# Patient Record
Sex: Female | Born: 1935 | Race: White | Hispanic: No | Marital: Married | Smoking: Never smoker
Health system: Southern US, Community
[De-identification: ages and names within clinical notes are randomized; demographics above are authoritative.]

## PROBLEM LIST (undated history)

## (undated) DIAGNOSIS — G43909 Migraine, unspecified, not intractable, without status migrainosus: Secondary | ICD-10-CM

## (undated) DIAGNOSIS — H3530 Unspecified macular degeneration: Secondary | ICD-10-CM

## (undated) DIAGNOSIS — M1990 Unspecified osteoarthritis, unspecified site: Secondary | ICD-10-CM

## (undated) DIAGNOSIS — E7800 Pure hypercholesterolemia, unspecified: Secondary | ICD-10-CM

## (undated) DIAGNOSIS — F0390 Unspecified dementia without behavioral disturbance: Secondary | ICD-10-CM

## (undated) DIAGNOSIS — I10 Essential (primary) hypertension: Secondary | ICD-10-CM

## (undated) DIAGNOSIS — R011 Cardiac murmur, unspecified: Secondary | ICD-10-CM

## (undated) DIAGNOSIS — K219 Gastro-esophageal reflux disease without esophagitis: Secondary | ICD-10-CM

## (undated) DIAGNOSIS — D649 Anemia, unspecified: Secondary | ICD-10-CM

## (undated) DIAGNOSIS — F419 Anxiety disorder, unspecified: Secondary | ICD-10-CM

## (undated) DIAGNOSIS — F32A Depression, unspecified: Secondary | ICD-10-CM

## (undated) DIAGNOSIS — E119 Type 2 diabetes mellitus without complications: Secondary | ICD-10-CM

## (undated) DIAGNOSIS — Z8781 Personal history of (healed) traumatic fracture: Secondary | ICD-10-CM

## (undated) DIAGNOSIS — M810 Age-related osteoporosis without current pathological fracture: Secondary | ICD-10-CM

## (undated) DIAGNOSIS — F329 Major depressive disorder, single episode, unspecified: Secondary | ICD-10-CM

## (undated) HISTORY — PX: JOINT REPLACEMENT: SHX530

## (undated) HISTORY — PX: ABDOMINAL HYSTERECTOMY: SHX81

## (undated) HISTORY — PX: KYPHOPLASTY: SHX5884

## (undated) HISTORY — PX: BREAST CYST EXCISION: SHX579

## (undated) HISTORY — PX: TONSILLECTOMY AND ADENOIDECTOMY: SUR1326

## (undated) HISTORY — DX: Depression, unspecified: F32.A

## (undated) HISTORY — PX: LAPAROSCOPIC CHOLECYSTECTOMY: SUR755

## (undated) HISTORY — DX: Anxiety disorder, unspecified: F41.9

## (undated) HISTORY — DX: Cardiac murmur, unspecified: R01.1

## (undated) HISTORY — DX: Anemia, unspecified: D64.9

## (undated) HISTORY — PX: APPENDECTOMY: SHX54

## (undated) HISTORY — PX: CATARACT EXTRACTION W/ INTRAOCULAR LENS  IMPLANT, BILATERAL: SHX1307

## (undated) HISTORY — PX: DILATION AND CURETTAGE OF UTERUS: SHX78

## (undated) HISTORY — DX: Age-related osteoporosis without current pathological fracture: M81.0

## (undated) HISTORY — PX: CARPAL TUNNEL RELEASE: SHX101

---
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
## 1998-10-12 ENCOUNTER — Ambulatory Visit (HOSPITAL_COMMUNITY): Admission: RE | Admit: 1998-10-12 | Discharge: 1998-10-12 | Payer: Self-pay | Admitting: Obstetrics & Gynecology

## 1999-09-29 ENCOUNTER — Encounter: Admission: RE | Admit: 1999-09-29 | Discharge: 1999-09-29 | Payer: Self-pay | Admitting: *Deleted

## 1999-09-29 ENCOUNTER — Encounter: Payer: Self-pay | Admitting: *Deleted

## 2000-10-03 ENCOUNTER — Encounter: Admission: RE | Admit: 2000-10-03 | Discharge: 2000-10-03 | Payer: Self-pay | Admitting: *Deleted

## 2000-10-03 ENCOUNTER — Encounter: Payer: Self-pay | Admitting: *Deleted

## 2000-11-08 ENCOUNTER — Encounter: Payer: Self-pay | Admitting: Obstetrics and Gynecology

## 2000-11-11 ENCOUNTER — Ambulatory Visit (HOSPITAL_COMMUNITY): Admission: RE | Admit: 2000-11-11 | Discharge: 2000-11-11 | Payer: Self-pay | Admitting: Obstetrics and Gynecology

## 2000-11-11 ENCOUNTER — Encounter (INDEPENDENT_AMBULATORY_CARE_PROVIDER_SITE_OTHER): Payer: Self-pay

## 2001-01-10 ENCOUNTER — Encounter (INDEPENDENT_AMBULATORY_CARE_PROVIDER_SITE_OTHER): Payer: Self-pay

## 2001-01-10 ENCOUNTER — Observation Stay (HOSPITAL_COMMUNITY): Admission: RE | Admit: 2001-01-10 | Discharge: 2001-01-11 | Payer: Self-pay | Admitting: Obstetrics and Gynecology

## 2001-10-06 ENCOUNTER — Encounter: Payer: Self-pay | Admitting: *Deleted

## 2001-10-06 ENCOUNTER — Encounter: Admission: RE | Admit: 2001-10-06 | Discharge: 2001-10-06 | Payer: Self-pay | Admitting: Family Medicine

## 2001-10-10 ENCOUNTER — Encounter: Admission: RE | Admit: 2001-10-10 | Discharge: 2001-10-10 | Payer: Self-pay | Admitting: Family Medicine

## 2001-10-10 ENCOUNTER — Encounter: Payer: Self-pay | Admitting: *Deleted

## 2002-05-29 ENCOUNTER — Ambulatory Visit (HOSPITAL_COMMUNITY): Admission: RE | Admit: 2002-05-29 | Discharge: 2002-05-29 | Payer: Self-pay | Admitting: Rheumatology

## 2002-05-29 ENCOUNTER — Encounter: Payer: Self-pay | Admitting: Rheumatology

## 2002-06-03 ENCOUNTER — Encounter: Payer: Self-pay | Admitting: Rheumatology

## 2002-06-03 ENCOUNTER — Ambulatory Visit (HOSPITAL_COMMUNITY): Admission: RE | Admit: 2002-06-03 | Discharge: 2002-06-03 | Payer: Self-pay | Admitting: Rheumatology

## 2002-10-14 ENCOUNTER — Encounter: Admission: RE | Admit: 2002-10-14 | Discharge: 2002-10-14 | Payer: Self-pay | Admitting: Family Medicine

## 2002-10-14 ENCOUNTER — Encounter: Payer: Self-pay | Admitting: *Deleted

## 2003-12-01 ENCOUNTER — Encounter: Admission: RE | Admit: 2003-12-01 | Discharge: 2003-12-01 | Payer: Self-pay | Admitting: Family Medicine

## 2005-01-18 ENCOUNTER — Encounter: Admission: RE | Admit: 2005-01-18 | Discharge: 2005-01-18 | Payer: Self-pay | Admitting: Family Medicine

## 2006-01-21 ENCOUNTER — Encounter: Admission: RE | Admit: 2006-01-21 | Discharge: 2006-01-21 | Payer: Self-pay | Admitting: Family Medicine

## 2007-02-19 ENCOUNTER — Encounter: Admission: RE | Admit: 2007-02-19 | Discharge: 2007-02-19 | Payer: Self-pay | Admitting: Family Medicine

## 2008-03-11 ENCOUNTER — Encounter: Admission: RE | Admit: 2008-03-11 | Discharge: 2008-03-11 | Payer: Self-pay | Admitting: Family Medicine

## 2011-02-23 NOTE — Op Note (Signed)
[facility]  Patient:    [name], [name]                       MRN: [id number] Proc. Date: 11/11/00 Adm. Date:  [id number] Attending:  Trevor Iha                           Operative Report  PREOPERATIVE DIAGNOSES:       1. Abnormal uterine bleeding.                               2. Endometrial mass on sonohistogram.  POSTOPERATIVE DIAGNOSES:      1. Abnormal uterine bleeding.                               2. Endometrial mass on sonohistogram.                               3. Endometrial polyp.  PROCEDURES:                   Hysteroscopy with D&C and polypectomy.  SURGEON:                      Trevor Iha, M.D.  ANESTHESIA:                   Monitored anesthetic care and paracervical                               block.  ESTIMATED BLOOD LOSS:         20 cc.  SORBITOL DEFICIT:             200 cc.  FINDINGS:                     Several small endometrial polyps in the posterior left fundus.  COMPLICATIONS:                Small left bundle uterine perforation, which appeared to be hemostatic by hysteroscope.  INDICATIONS:                  Ms. Dudenhoeffer is a 77 year old G2, P2 with abnormal uterine bleeding, postmenopausal bleeding with a normal endometrial biopsy two years ago.  Underwent a sonohistogram which showed a posterior endometrial 6 mm polypoid-appearing mass.  She presents today for hysteroscopy and seen for evaluation and treatment.  Informed consent was obtained.  DESCRIPTION OF PROCEDURE:     After adequate analgesia, the patient was placed in the dorsolithotomy position.  She was sterilely prepped and draped.  The bladder was sterilely drained.  Graves speculum was placed.  A paracervical block was placed with 1% Xylocaine and tenaculum was placed in the anterior lip of the cervix.  Uterus was sounded to 8 cm and easily dilated to a #25 dilator.  The hysteroscope was inserted, revealing the above findings.   Polyp forceps were used to grasp the endometrial polyps and retrieve them. Gentle sharp curettage was performed, retrieving several small fragments.  At this time hysteroscope was inserted; at this time unable to achieve adequate flow in the endometrial cavity to have good visualization; however,  we could tell the polyp appeared to be gone.  At this time, at the base of what the polyp was is a small 2-3 mm uterine perforation.  The hysteroscope was easily inserted into this and noted to be hemostatic.  However, it appeared to be Sorbitol flowing through the small perforation.  At this time the hysteroscope was removed.  The tenaculum was removed from the anterior lip of the cervix, and it was noted to be hemostatic.  The speculum was then removed.  The patient tolerated the procedure well and was stable in transfer to recovery.  ESTIMATED BLOOD LOSS:         (During procedure) 20 cc.  SORBITOL DEFICIT:             200 cc.  DISPOSITION:                  The patient will be discharged home with a routine instruction sheet for D&C.  Follow up in the office in two to three weeks.  We will watch her a little longer in the recovery room for any signs of intraabdominal bleeding or excess vaginal bleeding.  I will call in this evening to make sure she is still doing well. DD:  11/11/00 TD:  11/11/00 Job: [id number]

## 2011-02-23 NOTE — H&P (Signed)
[facility]  Patient:    [name], [name]                       MRN: [id number] Adm. Date:  [id number] Attending:  Trevor Iha                         History and Physical  HISTORY OF PRESENT ILLNESS:   Ms. Rochefort is a 77 year old, G2, P2, postmenopausal female, who had postmenopausal bleeding.  She underwent a hysteroscopy D&C for that, at which time showed complex hyperplasia without atypia.  The patient was offered hormonal management including Progesterone and re-biopsy in six months time versus an option of a hysterectomy.  The patient desires hysterectomy and presents today for laparoscopic-assisted vaginal hysterectomy and bilaterally salpingo-oophorectomy because she does understand that there is an increased risk of uterine cancer as quoted at approximately 3%.  PAST MEDICAL HISTORY:         1. Hypertension.                               2. Hypercholesterolemia.                               3. History of radiation therapy to the thyroid.  PAST SURGICAL HISTORY:        1. D&C.                               2. Vein surgery in the right leg.                               3. Several lumpectomies in the breast.  MEDICATIONS:                  1. Prempro 2.5 mg.                               2. Lipitor.  ALLERGIES:                    No known drug allergies.  PHYSICAL EXAMINATION:  VITAL SIGNS:                  Blood pressure 160/84.  HEART:                        Regular rate and rhythm.  LUNGS:                        Clear to auscultation bilaterally.  ABDOMEN:                      Nondistended, nontender.  PELVIC:                       Uterus is anteverted, mobile, nontender.  No adnexal masses are palpable.  IMPRESSION AND PLAN:          Abnormal uterine bleeding and postmenopausal bleeding.  Endometrial biopsy consistent with complex hyperplasia without atypia.  The patient desires hysterectomy and presents today  for laparoscopic-assisted vaginal hysterectomy with bilateral salpingo-oophorectomy.  The risks and benefits were discussed at length which include but are not limited to, risks of infection; bleeding; damage to bowel, bladder, ureters; possibility of cancer; and also the fact that we may not be able to eliminate ovarian cancer by removing the ovaries.  Also risk associated with blood transfusion including risks of hepatitis or AIDS and reaction to the blood transfusion.  She does give her informed consent. DD:  01/10/01 TD:  01/10/01 Job: [id number]

## 2011-02-23 NOTE — Discharge Summary (Signed)
[facility]  Patient:    KWANA, RINGEL                       MRN: [id number] Adm. Date:  [id number] Disc. Date: 01/11/01 Attending:  Trevor Iha                           Discharge Summary  HISTORY OF PRESENT ILLNESS:  Ms. Vanblarcom is a 77 year old G2, P2 with endometrial hyperplasia which is complex without atypia.  She presents for laparoscopic video assisted vaginal hysterectomy and bilateral salpingo-oophorectomy.  HOSPITAL COURSE:  The patient underwent a laparoscopic-assisted vaginal hysterectomy BSO with lysis of adhesions.  The surgery was uncomplicated.  ESTIMATED BLOOD LOSS:  100 cc.  FINDINGS AT TIME OF SURGERY:  Normal appearing uterus, tubes and ovaries but there were adhesions to the cecal appendix area which were sharply lysed during laparoscopy.  Her postoperative course is unremarkable and by postoperative day #1 she was ambulated without difficulty, tolerated a regular diet.  Her postoperative hemoglobin was 10.4 and patient desired discharge home.  The incisions were clean, dry and intact at time of discharge and she remained afebrile throughout her hospital course.  DISPOSITION:  Patient will be discharged home to follow up in the office in approximately 10 days.  She is sent home with a prescription for Darvocet 1-2 every 4 hours p.r.n. and told to return for increased fever, tenderness, bleeding or increased pain. DD:  01/11/01 TD:  01/11/01 Job: [id number]

## 2011-02-23 NOTE — H&P (Signed)
[facility]  Patient:    [name], [name]                       MRN: [id number] Adm. Date:  [id number] Attending:  Trevor Iha                         History and Physical  DATE OF BIRTH:  1923-01-08  HISTORY OF PRESENT ILLNESS:  The patient is a 77 year old gravida 2, para 2, who presents with abnormal uterine bleeding.  Last seen in my office 2-3 years ago.  She had some abnormal uterine bleeding, had an endometrial biopsy, and was benign.  She was continued on her PremPro.  She reported recently several bouts of bleeding, and then dark tissue, and on September 26, 2000, underwent a sonohistogram.  The sonohistogram showed a 6 mm density of the left endometrial wall which had calcifications to it.  Unable to determine whether this was a calcified polyp or endometrial fibroid.  She presents today for hysteroscopy and D&C for evaluation of abnormal bleeding and endometrial mass.  PAST MEDICAL HISTORY:  Significant for hypothyroidism, status post radioactive iodine.  PAST SURGICAL HISTORY:  Significant for benign lumpectomies in the breasts and also vein surgery.  MEDICATIONS:  Currently she is on Lipitor, PremPro, vitamin E, calcium, and vitamin D.  ALLERGIES:  No known drug allergies.  PHYSICAL EXAMINATION:  VITAL SIGNS:  Blood pressure is 174/90.  HEART:  Regular rate and rhythm.  LUNGS:  Clear to auscultation bilaterally.  ABDOMEN:  Nondistended and nontender.  PELVIC EXAMINATION:  Reveals an anteverted mobile nontender uterus.  The cervix is grossly within normal limits.  IMPRESSION AND PLAN:  Abnormal uterine bleeding and postmenopausal bleeding with endometrial mass.  Recommend hysteroscopy, D&C for evaluation and/or treatment of this.  Risks and benefits were discussed at length including, but not limited to risks of infection, bleeding, damage to uterus, tubes, ovaries, bowel, or bladder, or possibility of not relieving  pelvic bleeding.  Now, the patient does give her informed consent. DD:  11/11/00 TD:  11/11/00 Job: [id number]

## 2011-02-23 NOTE — Op Note (Signed)
[facility]  Patient:    [name], [name]                       MRN: [id number] Proc. Date: 01/10/01 Adm. Date:  [id number] Attending:  Trevor Iha                           Operative Report  PREOPERATIVE DIAGNOSES:       Postmenopausal bleeding and endometrial hyperplasia.  POSTOPERATIVE DIAGNOSES:      Postmenopausal bleeding and endometrial hyperplasia, plus adhesions.  PROCEDURE:                    Laparoscopically assisted vaginal hysterectomy with bilateral salpingo-oophorectomy with lysis of adhesions.  SURGEON:                      Trevor Iha, M.D.  ASSISTANT:                    Raynald Kemp, M.D.  ANESTHESIA:                   General endotracheal.  INDICATION:                   Ms. Knodel is a 77 year old G2, P2 with abnormal uterine bleeding.  Hysteroscopy/D&C revealed complex hyperplasia without atypia.  Patient was offered hormonal therapy with re-biopsy versus hysterectomy; patient desires hysterectomy and presents today for that, also removal of both tubes and ovaries.  Risks and benefits were discussed at length including, but not limited to, risks of infection, bleeding, damage to bowel, bladder or ureters, risk of cancer, also risks associated with blood transfusion including hepatitis or AIDS or reaction to blood transfusion. Patient does give her informed consent.  FINDINGS:                     Findings at the time of surgery included normal-appearing uterus, tubes and ovaries.  The cecum was adhered to the anterior abdominal wall, as was the omentum.  Normal-appearing appendix, otherwise.  The liver was slightly enlarged -- left lobe of the liver.  No obvious lesions of the liver were noted.  DESCRIPTION OF PROCEDURE:     After adequate anesthesia, the patient was placed in the dorsal lithotomy position.  She was sterilely prepped and draped.  The bladder was sterilely drained.  A ______  tenaculum was  placed on the cervix.  A 1-cm infraumbilical skin incision was made.  A Veress needle was inserted.  The abdomen was insufflated to dullness to percussion and two 5-mm trocars were placed to the left and right of the midline, two fingerbreadths above the pubic symphysis under direct visualization.  The above findings were noted.  At this time, lysis of adhesions was performed over the cecum, with care taken to avoid the bowel and also the omentum. Hemostasis was achieved with Bovie cautery.  After good hemostasis and the adhesions were removed, Ligasure instrument was placed across the right infundibulopelvic ligament with good cautery across the infundibulopelvic ligament.  It was then cut.  This was performed down across the round ligament with good hemostasis throughout the procedure.  The left infundibulopelvic ligament was identified in a similar fashion and the Ligasure instrument was placed across the IFP.  It was ligated and cut.  This was performed down the broad ligament, down across  the round ligament, with good hemostasis achieved at all levels.  At this time, the abdomen was desufflated.  The vaginal portion of the procedure was begun.  A weighted speculum was placed in the vagina.  A Jacobs tenaculum was placed on the posterior cervix.  A posterior colpotomy was performed.  The cervix was circumscribed with Bovie cautery. Ligasure instrument was placed across the uterosacral ligaments bilaterally, clamped and cut after good thermal burn.  This was also performed across the cardinal ligament and out the bladder pillars.  Bladder was dissected off the anterior surface of the cervix and anterior peritoneum was entered and replaced behind the bladder blade.  Ligasure instrument was placed across the uterine vasculature and on the lower portion of the broad ligaments bilaterally.  Good hemostasis was achieved with cautery and at this time, the fundus of the uterus was grasped with  a thyroid tenaculum.  Ligasure was placed across the remaining pedicles, clamped with good thermal burn with the Ligasure and then cut.  The uterus tubes and ovaries were removed intact. Good hemostasis was achieved.  Uterosacral ligaments were identified bilaterally.  They were suture-ligated with 0 Monocryl in a figure-of-eight fashion.  The remainder of the infundibulopelvic ligament was also ligated with 0 Monocryl in a figure-of-eight fashion.  Posterior peritoneum was closed in a pursestring fashion.  The vagina was then closed in a vertical fashion, plicating the uterosacral ligaments in the midline with 0 Monocryl suture in figure-of-eight.  The vagina was then closed in a vertical fashion with figure-of-eights of 0 Monocryl with good approximation and good hemostasis. At this time, a Foley catheter was placed with good return of clear urine. The abdomen was re-insufflated and small peritoneal edges were cauterized with Bovie cautery; good hemostasis was achieved and after irrigation and adequate hemostasis were achieved, the trocars were removed.  The infraumbilical skin incision was closed with 0 Vicryl in the fascia and 3-0 Vicryl Rapide subcuticular stitch in the skin.  The 5-mm ports were closed with 3-0 Vicryl Rapide in a subcuticular stitch.  They were injected with 0.25% Marcaine, approximately 10 cc.  The patient tolerated the procedure well and was stable on transfer to the recovery room.  Sponge and instrument count was normal x 3. Estimated blood loss during the procedure was 100 cc.  Patient received cefotetan 1 g preoperatively. DD:  01/10/01 TD:  01/10/01 Job: [id number]

## 2011-10-22 DIAGNOSIS — I10 Essential (primary) hypertension: Secondary | ICD-10-CM | POA: Diagnosis not present

## 2011-10-22 DIAGNOSIS — M810 Age-related osteoporosis without current pathological fracture: Secondary | ICD-10-CM | POA: Diagnosis not present

## 2011-10-22 DIAGNOSIS — Z130 Encounter for screening for diseases of the blood and blood-forming organs and certain disorders involving the immune mechanism: Secondary | ICD-10-CM | POA: Diagnosis not present

## 2011-10-22 DIAGNOSIS — E782 Mixed hyperlipidemia: Secondary | ICD-10-CM | POA: Diagnosis not present

## 2011-11-15 DIAGNOSIS — Z79899 Other long term (current) drug therapy: Secondary | ICD-10-CM | POA: Diagnosis not present

## 2011-12-03 DIAGNOSIS — M810 Age-related osteoporosis without current pathological fracture: Secondary | ICD-10-CM | POA: Diagnosis not present

## 2012-02-15 DIAGNOSIS — L255 Unspecified contact dermatitis due to plants, except food: Secondary | ICD-10-CM | POA: Diagnosis not present

## 2012-02-15 DIAGNOSIS — B029 Zoster without complications: Secondary | ICD-10-CM | POA: Diagnosis not present

## 2012-03-06 DIAGNOSIS — Z961 Presence of intraocular lens: Secondary | ICD-10-CM | POA: Diagnosis not present

## 2012-03-06 DIAGNOSIS — H35379 Puckering of macula, unspecified eye: Secondary | ICD-10-CM | POA: Diagnosis not present

## 2012-03-13 DIAGNOSIS — R195 Other fecal abnormalities: Secondary | ICD-10-CM | POA: Diagnosis not present

## 2012-03-13 DIAGNOSIS — R110 Nausea: Secondary | ICD-10-CM | POA: Diagnosis not present

## 2012-03-13 DIAGNOSIS — R634 Abnormal weight loss: Secondary | ICD-10-CM | POA: Diagnosis not present

## 2012-03-14 DIAGNOSIS — R1013 Epigastric pain: Secondary | ICD-10-CM | POA: Diagnosis not present

## 2012-03-14 DIAGNOSIS — R634 Abnormal weight loss: Secondary | ICD-10-CM | POA: Diagnosis not present

## 2012-03-14 DIAGNOSIS — K2940 Chronic atrophic gastritis without bleeding: Secondary | ICD-10-CM | POA: Diagnosis not present

## 2012-03-18 DIAGNOSIS — R634 Abnormal weight loss: Secondary | ICD-10-CM | POA: Diagnosis not present

## 2012-03-18 DIAGNOSIS — R1013 Epigastric pain: Secondary | ICD-10-CM | POA: Diagnosis not present

## 2012-03-25 DIAGNOSIS — R195 Other fecal abnormalities: Secondary | ICD-10-CM | POA: Diagnosis not present

## 2012-03-25 DIAGNOSIS — R1310 Dysphagia, unspecified: Secondary | ICD-10-CM | POA: Diagnosis not present

## 2012-03-25 DIAGNOSIS — R935 Abnormal findings on diagnostic imaging of other abdominal regions, including retroperitoneum: Secondary | ICD-10-CM | POA: Diagnosis not present

## 2012-03-25 DIAGNOSIS — R634 Abnormal weight loss: Secondary | ICD-10-CM | POA: Diagnosis not present

## 2012-04-07 DIAGNOSIS — R933 Abnormal findings on diagnostic imaging of other parts of digestive tract: Secondary | ICD-10-CM | POA: Diagnosis not present

## 2012-04-07 DIAGNOSIS — K5730 Diverticulosis of large intestine without perforation or abscess without bleeding: Secondary | ICD-10-CM | POA: Diagnosis not present

## 2012-04-07 DIAGNOSIS — R195 Other fecal abnormalities: Secondary | ICD-10-CM | POA: Diagnosis not present

## 2012-04-23 DIAGNOSIS — R0982 Postnasal drip: Secondary | ICD-10-CM | POA: Diagnosis not present

## 2012-04-23 DIAGNOSIS — H6120 Impacted cerumen, unspecified ear: Secondary | ICD-10-CM | POA: Diagnosis not present

## 2012-04-23 DIAGNOSIS — H9209 Otalgia, unspecified ear: Secondary | ICD-10-CM | POA: Diagnosis not present

## 2012-05-08 DIAGNOSIS — K2960 Other gastritis without bleeding: Secondary | ICD-10-CM | POA: Diagnosis not present

## 2012-05-23 DIAGNOSIS — Z1231 Encounter for screening mammogram for malignant neoplasm of breast: Secondary | ICD-10-CM | POA: Diagnosis not present

## 2012-05-29 DIAGNOSIS — M25569 Pain in unspecified knee: Secondary | ICD-10-CM | POA: Diagnosis not present

## 2012-06-16 DIAGNOSIS — Z23 Encounter for immunization: Secondary | ICD-10-CM | POA: Diagnosis not present

## 2012-08-28 DIAGNOSIS — Z961 Presence of intraocular lens: Secondary | ICD-10-CM | POA: Diagnosis not present

## 2012-08-28 DIAGNOSIS — H35379 Puckering of macula, unspecified eye: Secondary | ICD-10-CM | POA: Diagnosis not present

## 2012-09-03 DIAGNOSIS — Z79899 Other long term (current) drug therapy: Secondary | ICD-10-CM | POA: Diagnosis not present

## 2012-09-03 DIAGNOSIS — E559 Vitamin D deficiency, unspecified: Secondary | ICD-10-CM | POA: Diagnosis not present

## 2012-09-03 DIAGNOSIS — R5383 Other fatigue: Secondary | ICD-10-CM | POA: Diagnosis not present

## 2012-09-03 DIAGNOSIS — R634 Abnormal weight loss: Secondary | ICD-10-CM | POA: Diagnosis not present

## 2012-09-03 DIAGNOSIS — R5381 Other malaise: Secondary | ICD-10-CM | POA: Diagnosis not present

## 2012-09-13 DIAGNOSIS — M7120 Synovial cyst of popliteal space [Baker], unspecified knee: Secondary | ICD-10-CM | POA: Diagnosis not present

## 2012-09-13 DIAGNOSIS — M25569 Pain in unspecified knee: Secondary | ICD-10-CM | POA: Diagnosis not present

## 2012-09-13 DIAGNOSIS — IMO0002 Reserved for concepts with insufficient information to code with codable children: Secondary | ICD-10-CM | POA: Diagnosis not present

## 2012-09-13 DIAGNOSIS — M25469 Effusion, unspecified knee: Secondary | ICD-10-CM | POA: Diagnosis not present

## 2012-09-13 DIAGNOSIS — S83289A Other tear of lateral meniscus, current injury, unspecified knee, initial encounter: Secondary | ICD-10-CM | POA: Diagnosis not present

## 2012-09-18 DIAGNOSIS — M25569 Pain in unspecified knee: Secondary | ICD-10-CM | POA: Diagnosis not present

## 2012-10-02 DIAGNOSIS — S83289A Other tear of lateral meniscus, current injury, unspecified knee, initial encounter: Secondary | ICD-10-CM | POA: Diagnosis not present

## 2012-10-02 DIAGNOSIS — M1120 Other chondrocalcinosis, unspecified site: Secondary | ICD-10-CM | POA: Diagnosis not present

## 2012-10-02 DIAGNOSIS — M1710 Unilateral primary osteoarthritis, unspecified knee: Secondary | ICD-10-CM | POA: Diagnosis not present

## 2012-10-02 DIAGNOSIS — M6750 Plica syndrome, unspecified knee: Secondary | ICD-10-CM | POA: Diagnosis not present

## 2012-10-02 DIAGNOSIS — M2240 Chondromalacia patellae, unspecified knee: Secondary | ICD-10-CM | POA: Diagnosis not present

## 2012-10-02 DIAGNOSIS — X58XXXA Exposure to other specified factors, initial encounter: Secondary | ICD-10-CM | POA: Diagnosis not present

## 2012-10-02 DIAGNOSIS — IMO0002 Reserved for concepts with insufficient information to code with codable children: Secondary | ICD-10-CM | POA: Diagnosis not present

## 2012-10-13 DIAGNOSIS — M2390 Unspecified internal derangement of unspecified knee: Secondary | ICD-10-CM | POA: Diagnosis not present

## 2012-10-13 DIAGNOSIS — R262 Difficulty in walking, not elsewhere classified: Secondary | ICD-10-CM | POA: Diagnosis not present

## 2012-10-13 DIAGNOSIS — M25569 Pain in unspecified knee: Secondary | ICD-10-CM | POA: Diagnosis not present

## 2012-10-13 DIAGNOSIS — M6281 Muscle weakness (generalized): Secondary | ICD-10-CM | POA: Diagnosis not present

## 2012-10-22 DIAGNOSIS — M4120 Other idiopathic scoliosis, site unspecified: Secondary | ICD-10-CM | POA: Diagnosis not present

## 2012-10-22 DIAGNOSIS — M549 Dorsalgia, unspecified: Secondary | ICD-10-CM | POA: Diagnosis not present

## 2012-10-22 DIAGNOSIS — M545 Low back pain: Secondary | ICD-10-CM | POA: Diagnosis not present

## 2012-11-05 DIAGNOSIS — M549 Dorsalgia, unspecified: Secondary | ICD-10-CM | POA: Diagnosis not present

## 2012-11-05 DIAGNOSIS — M25569 Pain in unspecified knee: Secondary | ICD-10-CM | POA: Diagnosis not present

## 2012-11-05 DIAGNOSIS — M545 Low back pain: Secondary | ICD-10-CM | POA: Diagnosis not present

## 2012-11-05 DIAGNOSIS — M4120 Other idiopathic scoliosis, site unspecified: Secondary | ICD-10-CM | POA: Diagnosis not present

## 2012-11-06 DIAGNOSIS — M25569 Pain in unspecified knee: Secondary | ICD-10-CM | POA: Diagnosis not present

## 2012-11-06 DIAGNOSIS — M545 Low back pain: Secondary | ICD-10-CM | POA: Diagnosis not present

## 2012-11-06 DIAGNOSIS — M549 Dorsalgia, unspecified: Secondary | ICD-10-CM | POA: Diagnosis not present

## 2012-12-02 DIAGNOSIS — Z1329 Encounter for screening for other suspected endocrine disorder: Secondary | ICD-10-CM | POA: Diagnosis not present

## 2012-12-02 DIAGNOSIS — E782 Mixed hyperlipidemia: Secondary | ICD-10-CM | POA: Diagnosis not present

## 2012-12-02 DIAGNOSIS — N182 Chronic kidney disease, stage 2 (mild): Secondary | ICD-10-CM | POA: Diagnosis not present

## 2012-12-02 DIAGNOSIS — Z79899 Other long term (current) drug therapy: Secondary | ICD-10-CM | POA: Diagnosis not present

## 2012-12-02 DIAGNOSIS — E559 Vitamin D deficiency, unspecified: Secondary | ICD-10-CM | POA: Diagnosis not present

## 2012-12-03 DIAGNOSIS — R809 Proteinuria, unspecified: Secondary | ICD-10-CM | POA: Diagnosis not present

## 2012-12-03 DIAGNOSIS — Z79899 Other long term (current) drug therapy: Secondary | ICD-10-CM | POA: Diagnosis not present

## 2012-12-03 DIAGNOSIS — N182 Chronic kidney disease, stage 2 (mild): Secondary | ICD-10-CM | POA: Diagnosis not present

## 2012-12-16 DIAGNOSIS — M810 Age-related osteoporosis without current pathological fracture: Secondary | ICD-10-CM | POA: Diagnosis not present

## 2012-12-25 DIAGNOSIS — IMO0001 Reserved for inherently not codable concepts without codable children: Secondary | ICD-10-CM | POA: Diagnosis not present

## 2013-01-01 DIAGNOSIS — M545 Low back pain: Secondary | ICD-10-CM | POA: Diagnosis not present

## 2013-01-06 DIAGNOSIS — H903 Sensorineural hearing loss, bilateral: Secondary | ICD-10-CM | POA: Diagnosis not present

## 2013-01-08 DIAGNOSIS — R7301 Impaired fasting glucose: Secondary | ICD-10-CM | POA: Diagnosis not present

## 2013-03-16 DIAGNOSIS — Z961 Presence of intraocular lens: Secondary | ICD-10-CM | POA: Diagnosis not present

## 2013-03-16 DIAGNOSIS — H35379 Puckering of macula, unspecified eye: Secondary | ICD-10-CM | POA: Diagnosis not present

## 2013-03-25 DIAGNOSIS — Z0000 Encounter for general adult medical examination without abnormal findings: Secondary | ICD-10-CM | POA: Diagnosis not present

## 2013-03-25 DIAGNOSIS — M810 Age-related osteoporosis without current pathological fracture: Secondary | ICD-10-CM | POA: Diagnosis not present

## 2013-03-25 DIAGNOSIS — E119 Type 2 diabetes mellitus without complications: Secondary | ICD-10-CM | POA: Diagnosis not present

## 2013-03-25 DIAGNOSIS — Z1211 Encounter for screening for malignant neoplasm of colon: Secondary | ICD-10-CM | POA: Diagnosis not present

## 2013-03-25 DIAGNOSIS — Z79899 Other long term (current) drug therapy: Secondary | ICD-10-CM | POA: Diagnosis not present

## 2013-03-25 DIAGNOSIS — E782 Mixed hyperlipidemia: Secondary | ICD-10-CM | POA: Diagnosis not present

## 2013-03-25 DIAGNOSIS — I10 Essential (primary) hypertension: Secondary | ICD-10-CM | POA: Diagnosis not present

## 2013-03-30 DIAGNOSIS — Z1211 Encounter for screening for malignant neoplasm of colon: Secondary | ICD-10-CM | POA: Diagnosis not present

## 2013-07-02 DIAGNOSIS — E119 Type 2 diabetes mellitus without complications: Secondary | ICD-10-CM | POA: Diagnosis not present

## 2013-07-02 DIAGNOSIS — G5690 Unspecified mononeuropathy of unspecified upper limb: Secondary | ICD-10-CM | POA: Diagnosis not present

## 2013-07-02 DIAGNOSIS — D539 Nutritional anemia, unspecified: Secondary | ICD-10-CM | POA: Diagnosis not present

## 2013-07-02 DIAGNOSIS — R51 Headache: Secondary | ICD-10-CM | POA: Diagnosis not present

## 2013-07-02 DIAGNOSIS — Z006 Encounter for examination for normal comparison and control in clinical research program: Secondary | ICD-10-CM | POA: Diagnosis not present

## 2013-07-02 DIAGNOSIS — Z23 Encounter for immunization: Secondary | ICD-10-CM | POA: Diagnosis not present

## 2013-07-02 DIAGNOSIS — I10 Essential (primary) hypertension: Secondary | ICD-10-CM | POA: Diagnosis not present

## 2013-07-14 DIAGNOSIS — Z1231 Encounter for screening mammogram for malignant neoplasm of breast: Secondary | ICD-10-CM | POA: Diagnosis not present

## 2013-08-26 DIAGNOSIS — G5600 Carpal tunnel syndrome, unspecified upper limb: Secondary | ICD-10-CM | POA: Diagnosis not present

## 2013-08-26 DIAGNOSIS — G43119 Migraine with aura, intractable, without status migrainosus: Secondary | ICD-10-CM | POA: Diagnosis not present

## 2013-08-26 DIAGNOSIS — M542 Cervicalgia: Secondary | ICD-10-CM | POA: Diagnosis not present

## 2013-09-10 DIAGNOSIS — H6120 Impacted cerumen, unspecified ear: Secondary | ICD-10-CM | POA: Diagnosis not present

## 2013-09-10 DIAGNOSIS — H903 Sensorineural hearing loss, bilateral: Secondary | ICD-10-CM | POA: Diagnosis not present

## 2013-10-07 DIAGNOSIS — M542 Cervicalgia: Secondary | ICD-10-CM | POA: Diagnosis not present

## 2013-10-07 DIAGNOSIS — G43119 Migraine with aura, intractable, without status migrainosus: Secondary | ICD-10-CM | POA: Diagnosis not present

## 2013-10-08 DIAGNOSIS — H269 Unspecified cataract: Secondary | ICD-10-CM

## 2013-10-08 HISTORY — DX: Unspecified cataract: H26.9

## 2013-11-05 DIAGNOSIS — J019 Acute sinusitis, unspecified: Secondary | ICD-10-CM | POA: Diagnosis not present

## 2013-11-11 DIAGNOSIS — R634 Abnormal weight loss: Secondary | ICD-10-CM | POA: Diagnosis not present

## 2013-11-27 DIAGNOSIS — K5730 Diverticulosis of large intestine without perforation or abscess without bleeding: Secondary | ICD-10-CM | POA: Diagnosis not present

## 2013-11-27 DIAGNOSIS — C240 Malignant neoplasm of extrahepatic bile duct: Secondary | ICD-10-CM | POA: Diagnosis not present

## 2013-11-27 DIAGNOSIS — R634 Abnormal weight loss: Secondary | ICD-10-CM | POA: Diagnosis not present

## 2013-11-27 DIAGNOSIS — K4090 Unilateral inguinal hernia, without obstruction or gangrene, not specified as recurrent: Secondary | ICD-10-CM | POA: Diagnosis not present

## 2013-11-27 DIAGNOSIS — R748 Abnormal levels of other serum enzymes: Secondary | ICD-10-CM | POA: Diagnosis not present

## 2013-11-27 DIAGNOSIS — K869 Disease of pancreas, unspecified: Secondary | ICD-10-CM | POA: Diagnosis not present

## 2013-12-10 DIAGNOSIS — I10 Essential (primary) hypertension: Secondary | ICD-10-CM | POA: Diagnosis not present

## 2013-12-10 DIAGNOSIS — E782 Mixed hyperlipidemia: Secondary | ICD-10-CM | POA: Diagnosis not present

## 2013-12-10 DIAGNOSIS — Z1329 Encounter for screening for other suspected endocrine disorder: Secondary | ICD-10-CM | POA: Diagnosis not present

## 2013-12-10 DIAGNOSIS — E119 Type 2 diabetes mellitus without complications: Secondary | ICD-10-CM | POA: Diagnosis not present

## 2013-12-10 DIAGNOSIS — N189 Chronic kidney disease, unspecified: Secondary | ICD-10-CM | POA: Diagnosis not present

## 2013-12-10 DIAGNOSIS — M1990 Unspecified osteoarthritis, unspecified site: Secondary | ICD-10-CM | POA: Diagnosis not present

## 2013-12-15 DIAGNOSIS — K862 Cyst of pancreas: Secondary | ICD-10-CM | POA: Diagnosis not present

## 2013-12-15 DIAGNOSIS — D379 Neoplasm of uncertain behavior of digestive organ, unspecified: Secondary | ICD-10-CM | POA: Diagnosis not present

## 2013-12-15 DIAGNOSIS — Z79899 Other long term (current) drug therapy: Secondary | ICD-10-CM | POA: Diagnosis not present

## 2013-12-15 DIAGNOSIS — K863 Pseudocyst of pancreas: Secondary | ICD-10-CM | POA: Diagnosis not present

## 2013-12-21 DIAGNOSIS — Z881 Allergy status to other antibiotic agents status: Secondary | ICD-10-CM | POA: Diagnosis not present

## 2013-12-21 DIAGNOSIS — E7800 Pure hypercholesterolemia, unspecified: Secondary | ICD-10-CM | POA: Diagnosis not present

## 2013-12-21 DIAGNOSIS — K8689 Other specified diseases of pancreas: Secondary | ICD-10-CM | POA: Diagnosis not present

## 2013-12-21 DIAGNOSIS — Z86010 Personal history of colonic polyps: Secondary | ICD-10-CM | POA: Diagnosis not present

## 2013-12-21 DIAGNOSIS — I10 Essential (primary) hypertension: Secondary | ICD-10-CM | POA: Diagnosis not present

## 2013-12-21 DIAGNOSIS — R933 Abnormal findings on diagnostic imaging of other parts of digestive tract: Secondary | ICD-10-CM | POA: Diagnosis not present

## 2013-12-21 DIAGNOSIS — K219 Gastro-esophageal reflux disease without esophagitis: Secondary | ICD-10-CM | POA: Diagnosis not present

## 2013-12-24 DIAGNOSIS — H35379 Puckering of macula, unspecified eye: Secondary | ICD-10-CM | POA: Diagnosis not present

## 2013-12-24 DIAGNOSIS — Z961 Presence of intraocular lens: Secondary | ICD-10-CM | POA: Diagnosis not present

## 2013-12-24 DIAGNOSIS — H353190 Nonexudative age-related macular degeneration, unspecified eye, stage unspecified: Secondary | ICD-10-CM | POA: Diagnosis not present

## 2013-12-29 DIAGNOSIS — D490 Neoplasm of unspecified behavior of digestive system: Secondary | ICD-10-CM | POA: Diagnosis not present

## 2013-12-31 DIAGNOSIS — Z79899 Other long term (current) drug therapy: Secondary | ICD-10-CM | POA: Diagnosis not present

## 2014-01-29 DIAGNOSIS — M810 Age-related osteoporosis without current pathological fracture: Secondary | ICD-10-CM | POA: Diagnosis not present

## 2014-02-02 DIAGNOSIS — I10 Essential (primary) hypertension: Secondary | ICD-10-CM | POA: Diagnosis not present

## 2014-02-02 DIAGNOSIS — K5730 Diverticulosis of large intestine without perforation or abscess without bleeding: Secondary | ICD-10-CM | POA: Diagnosis not present

## 2014-02-02 DIAGNOSIS — E782 Mixed hyperlipidemia: Secondary | ICD-10-CM | POA: Diagnosis not present

## 2014-02-02 DIAGNOSIS — N189 Chronic kidney disease, unspecified: Secondary | ICD-10-CM | POA: Diagnosis not present

## 2014-02-02 DIAGNOSIS — K2100 Gastro-esophageal reflux disease with esophagitis, without bleeding: Secondary | ICD-10-CM | POA: Diagnosis not present

## 2014-02-02 DIAGNOSIS — E039 Hypothyroidism, unspecified: Secondary | ICD-10-CM | POA: Diagnosis not present

## 2014-02-02 DIAGNOSIS — K589 Irritable bowel syndrome without diarrhea: Secondary | ICD-10-CM | POA: Diagnosis not present

## 2014-02-02 DIAGNOSIS — Z23 Encounter for immunization: Secondary | ICD-10-CM | POA: Diagnosis not present

## 2014-02-02 DIAGNOSIS — F411 Generalized anxiety disorder: Secondary | ICD-10-CM | POA: Diagnosis not present

## 2014-02-02 DIAGNOSIS — G5600 Carpal tunnel syndrome, unspecified upper limb: Secondary | ICD-10-CM | POA: Diagnosis not present

## 2014-02-02 DIAGNOSIS — M2550 Pain in unspecified joint: Secondary | ICD-10-CM | POA: Diagnosis not present

## 2014-02-02 DIAGNOSIS — E1129 Type 2 diabetes mellitus with other diabetic kidney complication: Secondary | ICD-10-CM | POA: Diagnosis not present

## 2014-03-24 DIAGNOSIS — R5381 Other malaise: Secondary | ICD-10-CM | POA: Diagnosis not present

## 2014-03-24 DIAGNOSIS — M2550 Pain in unspecified joint: Secondary | ICD-10-CM | POA: Diagnosis not present

## 2014-03-24 DIAGNOSIS — M810 Age-related osteoporosis without current pathological fracture: Secondary | ICD-10-CM | POA: Diagnosis not present

## 2014-03-24 DIAGNOSIS — G4700 Insomnia, unspecified: Secondary | ICD-10-CM | POA: Diagnosis not present

## 2014-03-24 DIAGNOSIS — Z780 Asymptomatic menopausal state: Secondary | ICD-10-CM | POA: Diagnosis not present

## 2014-03-24 DIAGNOSIS — R5383 Other fatigue: Secondary | ICD-10-CM | POA: Diagnosis not present

## 2014-04-22 DIAGNOSIS — H353190 Nonexudative age-related macular degeneration, unspecified eye, stage unspecified: Secondary | ICD-10-CM | POA: Diagnosis not present

## 2014-04-22 DIAGNOSIS — Z961 Presence of intraocular lens: Secondary | ICD-10-CM | POA: Diagnosis not present

## 2014-05-24 DIAGNOSIS — G5600 Carpal tunnel syndrome, unspecified upper limb: Secondary | ICD-10-CM | POA: Diagnosis not present

## 2014-06-08 DIAGNOSIS — G5600 Carpal tunnel syndrome, unspecified upper limb: Secondary | ICD-10-CM | POA: Diagnosis not present

## 2014-06-08 DIAGNOSIS — M6530 Trigger finger, unspecified finger: Secondary | ICD-10-CM | POA: Diagnosis not present

## 2014-06-16 DIAGNOSIS — G5600 Carpal tunnel syndrome, unspecified upper limb: Secondary | ICD-10-CM | POA: Diagnosis not present

## 2014-06-16 DIAGNOSIS — M6530 Trigger finger, unspecified finger: Secondary | ICD-10-CM | POA: Diagnosis not present

## 2014-06-16 DIAGNOSIS — M659 Synovitis and tenosynovitis, unspecified: Secondary | ICD-10-CM | POA: Diagnosis not present

## 2014-06-21 DIAGNOSIS — M6281 Muscle weakness (generalized): Secondary | ICD-10-CM | POA: Diagnosis not present

## 2014-06-25 DIAGNOSIS — H6980 Other specified disorders of Eustachian tube, unspecified ear: Secondary | ICD-10-CM | POA: Diagnosis not present

## 2014-06-25 DIAGNOSIS — H6520 Chronic serous otitis media, unspecified ear: Secondary | ICD-10-CM | POA: Diagnosis not present

## 2014-07-05 DIAGNOSIS — M6281 Muscle weakness (generalized): Secondary | ICD-10-CM | POA: Diagnosis not present

## 2014-07-19 DIAGNOSIS — H6520 Chronic serous otitis media, unspecified ear: Secondary | ICD-10-CM | POA: Diagnosis not present

## 2014-07-22 DIAGNOSIS — Z23 Encounter for immunization: Secondary | ICD-10-CM | POA: Diagnosis not present

## 2014-07-30 DIAGNOSIS — H9211 Otorrhea, right ear: Secondary | ICD-10-CM | POA: Diagnosis not present

## 2014-08-09 DIAGNOSIS — H6521 Chronic serous otitis media, right ear: Secondary | ICD-10-CM | POA: Diagnosis not present

## 2014-08-13 DIAGNOSIS — E1121 Type 2 diabetes mellitus with diabetic nephropathy: Secondary | ICD-10-CM | POA: Diagnosis not present

## 2014-08-13 DIAGNOSIS — Z79899 Other long term (current) drug therapy: Secondary | ICD-10-CM | POA: Diagnosis not present

## 2014-08-13 DIAGNOSIS — E782 Mixed hyperlipidemia: Secondary | ICD-10-CM | POA: Diagnosis not present

## 2014-08-13 DIAGNOSIS — I10 Essential (primary) hypertension: Secondary | ICD-10-CM | POA: Diagnosis not present

## 2014-08-13 DIAGNOSIS — K210 Gastro-esophageal reflux disease with esophagitis: Secondary | ICD-10-CM | POA: Diagnosis not present

## 2014-08-13 DIAGNOSIS — E559 Vitamin D deficiency, unspecified: Secondary | ICD-10-CM | POA: Diagnosis not present

## 2014-08-17 DIAGNOSIS — D559 Anemia due to enzyme disorder, unspecified: Secondary | ICD-10-CM | POA: Diagnosis not present

## 2014-08-17 DIAGNOSIS — D649 Anemia, unspecified: Secondary | ICD-10-CM | POA: Diagnosis not present

## 2014-08-17 DIAGNOSIS — D519 Vitamin B12 deficiency anemia, unspecified: Secondary | ICD-10-CM | POA: Diagnosis not present

## 2014-11-08 DIAGNOSIS — H6521 Chronic serous otitis media, right ear: Secondary | ICD-10-CM | POA: Diagnosis not present

## 2014-12-09 DIAGNOSIS — M170 Bilateral primary osteoarthritis of knee: Secondary | ICD-10-CM | POA: Diagnosis not present

## 2014-12-09 DIAGNOSIS — M25561 Pain in right knee: Secondary | ICD-10-CM | POA: Diagnosis not present

## 2014-12-09 DIAGNOSIS — M25562 Pain in left knee: Secondary | ICD-10-CM | POA: Diagnosis not present

## 2014-12-23 DIAGNOSIS — D519 Vitamin B12 deficiency anemia, unspecified: Secondary | ICD-10-CM | POA: Diagnosis not present

## 2014-12-23 DIAGNOSIS — Z8669 Personal history of other diseases of the nervous system and sense organs: Secondary | ICD-10-CM | POA: Diagnosis not present

## 2014-12-23 DIAGNOSIS — F329 Major depressive disorder, single episode, unspecified: Secondary | ICD-10-CM | POA: Diagnosis not present

## 2014-12-23 DIAGNOSIS — N181 Chronic kidney disease, stage 1: Secondary | ICD-10-CM | POA: Diagnosis not present

## 2014-12-23 DIAGNOSIS — E559 Vitamin D deficiency, unspecified: Secondary | ICD-10-CM | POA: Diagnosis not present

## 2014-12-23 DIAGNOSIS — E1165 Type 2 diabetes mellitus with hyperglycemia: Secondary | ICD-10-CM | POA: Diagnosis not present

## 2014-12-23 DIAGNOSIS — Z0000 Encounter for general adult medical examination without abnormal findings: Secondary | ICD-10-CM | POA: Diagnosis not present

## 2014-12-23 DIAGNOSIS — E782 Mixed hyperlipidemia: Secondary | ICD-10-CM | POA: Diagnosis not present

## 2014-12-23 DIAGNOSIS — I10 Essential (primary) hypertension: Secondary | ICD-10-CM | POA: Diagnosis not present

## 2014-12-23 DIAGNOSIS — E039 Hypothyroidism, unspecified: Secondary | ICD-10-CM | POA: Diagnosis not present

## 2014-12-23 DIAGNOSIS — M1990 Unspecified osteoarthritis, unspecified site: Secondary | ICD-10-CM | POA: Diagnosis not present

## 2015-01-20 DIAGNOSIS — Z79899 Other long term (current) drug therapy: Secondary | ICD-10-CM | POA: Diagnosis not present

## 2015-02-09 DIAGNOSIS — M810 Age-related osteoporosis without current pathological fracture: Secondary | ICD-10-CM | POA: Diagnosis not present

## 2015-03-03 DIAGNOSIS — Z961 Presence of intraocular lens: Secondary | ICD-10-CM | POA: Diagnosis not present

## 2015-03-03 DIAGNOSIS — H3531 Nonexudative age-related macular degeneration: Secondary | ICD-10-CM | POA: Diagnosis not present

## 2015-03-03 DIAGNOSIS — H35371 Puckering of macula, right eye: Secondary | ICD-10-CM | POA: Diagnosis not present

## 2015-03-31 DIAGNOSIS — Z1389 Encounter for screening for other disorder: Secondary | ICD-10-CM | POA: Diagnosis not present

## 2015-03-31 DIAGNOSIS — E1165 Type 2 diabetes mellitus with hyperglycemia: Secondary | ICD-10-CM | POA: Diagnosis not present

## 2015-03-31 DIAGNOSIS — Z9181 History of falling: Secondary | ICD-10-CM | POA: Diagnosis not present

## 2015-03-31 DIAGNOSIS — E782 Mixed hyperlipidemia: Secondary | ICD-10-CM | POA: Diagnosis not present

## 2015-04-07 DIAGNOSIS — M19041 Primary osteoarthritis, right hand: Secondary | ICD-10-CM | POA: Diagnosis not present

## 2015-04-07 DIAGNOSIS — G5602 Carpal tunnel syndrome, left upper limb: Secondary | ICD-10-CM | POA: Diagnosis not present

## 2015-05-17 DIAGNOSIS — H6980 Other specified disorders of Eustachian tube, unspecified ear: Secondary | ICD-10-CM | POA: Diagnosis not present

## 2015-05-26 DIAGNOSIS — H3531 Nonexudative age-related macular degeneration: Secondary | ICD-10-CM | POA: Diagnosis not present

## 2015-05-26 DIAGNOSIS — H35371 Puckering of macula, right eye: Secondary | ICD-10-CM | POA: Diagnosis not present

## 2015-05-26 DIAGNOSIS — Z961 Presence of intraocular lens: Secondary | ICD-10-CM | POA: Diagnosis not present

## 2015-05-31 DIAGNOSIS — M65841 Other synovitis and tenosynovitis, right hand: Secondary | ICD-10-CM | POA: Diagnosis not present

## 2015-05-31 DIAGNOSIS — M65311 Trigger thumb, right thumb: Secondary | ICD-10-CM | POA: Diagnosis not present

## 2015-05-31 DIAGNOSIS — G5602 Carpal tunnel syndrome, left upper limb: Secondary | ICD-10-CM | POA: Diagnosis not present

## 2015-06-08 DIAGNOSIS — M65311 Trigger thumb, right thumb: Secondary | ICD-10-CM | POA: Diagnosis not present

## 2015-06-08 DIAGNOSIS — Z4789 Encounter for other orthopedic aftercare: Secondary | ICD-10-CM | POA: Diagnosis not present

## 2015-06-10 DIAGNOSIS — M25642 Stiffness of left hand, not elsewhere classified: Secondary | ICD-10-CM | POA: Diagnosis not present

## 2015-06-30 DIAGNOSIS — Z4789 Encounter for other orthopedic aftercare: Secondary | ICD-10-CM | POA: Diagnosis not present

## 2015-07-05 DIAGNOSIS — I10 Essential (primary) hypertension: Secondary | ICD-10-CM | POA: Diagnosis not present

## 2015-07-05 DIAGNOSIS — M542 Cervicalgia: Secondary | ICD-10-CM | POA: Diagnosis not present

## 2015-07-05 DIAGNOSIS — M1990 Unspecified osteoarthritis, unspecified site: Secondary | ICD-10-CM | POA: Diagnosis not present

## 2015-07-05 DIAGNOSIS — E559 Vitamin D deficiency, unspecified: Secondary | ICD-10-CM | POA: Diagnosis not present

## 2015-07-05 DIAGNOSIS — E039 Hypothyroidism, unspecified: Secondary | ICD-10-CM | POA: Diagnosis not present

## 2015-07-05 DIAGNOSIS — Z23 Encounter for immunization: Secondary | ICD-10-CM | POA: Diagnosis not present

## 2015-07-05 DIAGNOSIS — N189 Chronic kidney disease, unspecified: Secondary | ICD-10-CM | POA: Diagnosis not present

## 2015-07-05 DIAGNOSIS — E1165 Type 2 diabetes mellitus with hyperglycemia: Secondary | ICD-10-CM | POA: Diagnosis not present

## 2015-07-07 DIAGNOSIS — M1711 Unilateral primary osteoarthritis, right knee: Secondary | ICD-10-CM | POA: Diagnosis not present

## 2015-07-07 DIAGNOSIS — M1712 Unilateral primary osteoarthritis, left knee: Secondary | ICD-10-CM | POA: Diagnosis not present

## 2015-08-25 DIAGNOSIS — M1712 Unilateral primary osteoarthritis, left knee: Secondary | ICD-10-CM | POA: Diagnosis not present

## 2015-08-25 DIAGNOSIS — M1711 Unilateral primary osteoarthritis, right knee: Secondary | ICD-10-CM | POA: Diagnosis not present

## 2015-10-13 DIAGNOSIS — M1711 Unilateral primary osteoarthritis, right knee: Secondary | ICD-10-CM | POA: Diagnosis not present

## 2015-10-13 DIAGNOSIS — M1712 Unilateral primary osteoarthritis, left knee: Secondary | ICD-10-CM | POA: Diagnosis not present

## 2015-12-27 DIAGNOSIS — E559 Vitamin D deficiency, unspecified: Secondary | ICD-10-CM | POA: Diagnosis not present

## 2015-12-27 DIAGNOSIS — E039 Hypothyroidism, unspecified: Secondary | ICD-10-CM | POA: Diagnosis not present

## 2015-12-27 DIAGNOSIS — E1165 Type 2 diabetes mellitus with hyperglycemia: Secondary | ICD-10-CM | POA: Diagnosis not present

## 2015-12-27 DIAGNOSIS — E782 Mixed hyperlipidemia: Secondary | ICD-10-CM | POA: Diagnosis not present

## 2015-12-27 DIAGNOSIS — M1990 Unspecified osteoarthritis, unspecified site: Secondary | ICD-10-CM | POA: Diagnosis not present

## 2015-12-27 DIAGNOSIS — E1121 Type 2 diabetes mellitus with diabetic nephropathy: Secondary | ICD-10-CM | POA: Diagnosis not present

## 2015-12-27 DIAGNOSIS — Z79899 Other long term (current) drug therapy: Secondary | ICD-10-CM | POA: Diagnosis not present

## 2015-12-27 DIAGNOSIS — N189 Chronic kidney disease, unspecified: Secondary | ICD-10-CM | POA: Diagnosis not present

## 2015-12-27 DIAGNOSIS — Z0000 Encounter for general adult medical examination without abnormal findings: Secondary | ICD-10-CM | POA: Diagnosis not present

## 2015-12-27 DIAGNOSIS — I10 Essential (primary) hypertension: Secondary | ICD-10-CM | POA: Diagnosis not present

## 2016-01-05 DIAGNOSIS — M1711 Unilateral primary osteoarthritis, right knee: Secondary | ICD-10-CM | POA: Diagnosis not present

## 2016-01-05 DIAGNOSIS — M1712 Unilateral primary osteoarthritis, left knee: Secondary | ICD-10-CM | POA: Diagnosis not present

## 2016-02-13 DIAGNOSIS — Z79899 Other long term (current) drug therapy: Secondary | ICD-10-CM | POA: Diagnosis not present

## 2016-02-21 DIAGNOSIS — M810 Age-related osteoporosis without current pathological fracture: Secondary | ICD-10-CM | POA: Diagnosis not present

## 2016-03-08 DIAGNOSIS — Z8781 Personal history of (healed) traumatic fracture: Secondary | ICD-10-CM

## 2016-03-08 HISTORY — DX: Personal history of (healed) traumatic fracture: Z87.81

## 2016-03-28 DIAGNOSIS — S329XXA Fracture of unspecified parts of lumbosacral spine and pelvis, initial encounter for closed fracture: Secondary | ICD-10-CM | POA: Diagnosis not present

## 2016-03-29 DIAGNOSIS — S32810A Multiple fractures of pelvis with stable disruption of pelvic ring, initial encounter for closed fracture: Secondary | ICD-10-CM | POA: Diagnosis not present

## 2016-04-26 DIAGNOSIS — S32810D Multiple fractures of pelvis with stable disruption of pelvic ring, subsequent encounter for fracture with routine healing: Secondary | ICD-10-CM | POA: Diagnosis not present

## 2016-04-30 DIAGNOSIS — R262 Difficulty in walking, not elsewhere classified: Secondary | ICD-10-CM | POA: Diagnosis not present

## 2016-04-30 DIAGNOSIS — M25551 Pain in right hip: Secondary | ICD-10-CM | POA: Diagnosis not present

## 2016-04-30 DIAGNOSIS — S3289XD Fracture of other parts of pelvis, subsequent encounter for fracture with routine healing: Secondary | ICD-10-CM | POA: Diagnosis not present

## 2016-05-02 DIAGNOSIS — S3289XD Fracture of other parts of pelvis, subsequent encounter for fracture with routine healing: Secondary | ICD-10-CM | POA: Diagnosis not present

## 2016-05-02 DIAGNOSIS — R262 Difficulty in walking, not elsewhere classified: Secondary | ICD-10-CM | POA: Diagnosis not present

## 2016-05-02 DIAGNOSIS — M25551 Pain in right hip: Secondary | ICD-10-CM | POA: Diagnosis not present

## 2016-05-04 DIAGNOSIS — S3289XD Fracture of other parts of pelvis, subsequent encounter for fracture with routine healing: Secondary | ICD-10-CM | POA: Diagnosis not present

## 2016-05-04 DIAGNOSIS — M25551 Pain in right hip: Secondary | ICD-10-CM | POA: Diagnosis not present

## 2016-05-04 DIAGNOSIS — R262 Difficulty in walking, not elsewhere classified: Secondary | ICD-10-CM | POA: Diagnosis not present

## 2016-05-07 DIAGNOSIS — M25551 Pain in right hip: Secondary | ICD-10-CM | POA: Diagnosis not present

## 2016-05-07 DIAGNOSIS — S3289XD Fracture of other parts of pelvis, subsequent encounter for fracture with routine healing: Secondary | ICD-10-CM | POA: Diagnosis not present

## 2016-05-07 DIAGNOSIS — R262 Difficulty in walking, not elsewhere classified: Secondary | ICD-10-CM | POA: Diagnosis not present

## 2016-05-09 DIAGNOSIS — M25551 Pain in right hip: Secondary | ICD-10-CM | POA: Diagnosis not present

## 2016-05-09 DIAGNOSIS — S3289XD Fracture of other parts of pelvis, subsequent encounter for fracture with routine healing: Secondary | ICD-10-CM | POA: Diagnosis not present

## 2016-05-09 DIAGNOSIS — R262 Difficulty in walking, not elsewhere classified: Secondary | ICD-10-CM | POA: Diagnosis not present

## 2016-05-11 DIAGNOSIS — R262 Difficulty in walking, not elsewhere classified: Secondary | ICD-10-CM | POA: Diagnosis not present

## 2016-05-11 DIAGNOSIS — M25551 Pain in right hip: Secondary | ICD-10-CM | POA: Diagnosis not present

## 2016-05-11 DIAGNOSIS — S3289XD Fracture of other parts of pelvis, subsequent encounter for fracture with routine healing: Secondary | ICD-10-CM | POA: Diagnosis not present

## 2016-05-14 DIAGNOSIS — S3289XD Fracture of other parts of pelvis, subsequent encounter for fracture with routine healing: Secondary | ICD-10-CM | POA: Diagnosis not present

## 2016-05-14 DIAGNOSIS — M25551 Pain in right hip: Secondary | ICD-10-CM | POA: Diagnosis not present

## 2016-05-14 DIAGNOSIS — R262 Difficulty in walking, not elsewhere classified: Secondary | ICD-10-CM | POA: Diagnosis not present

## 2016-05-16 DIAGNOSIS — M25551 Pain in right hip: Secondary | ICD-10-CM | POA: Diagnosis not present

## 2016-05-16 DIAGNOSIS — S3289XD Fracture of other parts of pelvis, subsequent encounter for fracture with routine healing: Secondary | ICD-10-CM | POA: Diagnosis not present

## 2016-05-16 DIAGNOSIS — R262 Difficulty in walking, not elsewhere classified: Secondary | ICD-10-CM | POA: Diagnosis not present

## 2016-05-18 DIAGNOSIS — M25551 Pain in right hip: Secondary | ICD-10-CM | POA: Diagnosis not present

## 2016-05-18 DIAGNOSIS — R262 Difficulty in walking, not elsewhere classified: Secondary | ICD-10-CM | POA: Diagnosis not present

## 2016-05-18 DIAGNOSIS — S3289XD Fracture of other parts of pelvis, subsequent encounter for fracture with routine healing: Secondary | ICD-10-CM | POA: Diagnosis not present

## 2016-05-21 DIAGNOSIS — R262 Difficulty in walking, not elsewhere classified: Secondary | ICD-10-CM | POA: Diagnosis not present

## 2016-05-21 DIAGNOSIS — S3289XD Fracture of other parts of pelvis, subsequent encounter for fracture with routine healing: Secondary | ICD-10-CM | POA: Diagnosis not present

## 2016-05-21 DIAGNOSIS — M25551 Pain in right hip: Secondary | ICD-10-CM | POA: Diagnosis not present

## 2016-05-23 DIAGNOSIS — M25551 Pain in right hip: Secondary | ICD-10-CM | POA: Diagnosis not present

## 2016-05-23 DIAGNOSIS — R262 Difficulty in walking, not elsewhere classified: Secondary | ICD-10-CM | POA: Diagnosis not present

## 2016-05-23 DIAGNOSIS — S3289XD Fracture of other parts of pelvis, subsequent encounter for fracture with routine healing: Secondary | ICD-10-CM | POA: Diagnosis not present

## 2016-05-25 DIAGNOSIS — M25551 Pain in right hip: Secondary | ICD-10-CM | POA: Diagnosis not present

## 2016-05-25 DIAGNOSIS — R262 Difficulty in walking, not elsewhere classified: Secondary | ICD-10-CM | POA: Diagnosis not present

## 2016-05-25 DIAGNOSIS — S3289XD Fracture of other parts of pelvis, subsequent encounter for fracture with routine healing: Secondary | ICD-10-CM | POA: Diagnosis not present

## 2016-05-28 DIAGNOSIS — S32810D Multiple fractures of pelvis with stable disruption of pelvic ring, subsequent encounter for fracture with routine healing: Secondary | ICD-10-CM | POA: Diagnosis not present

## 2016-05-28 DIAGNOSIS — M25551 Pain in right hip: Secondary | ICD-10-CM | POA: Diagnosis not present

## 2016-05-28 DIAGNOSIS — M25552 Pain in left hip: Secondary | ICD-10-CM | POA: Diagnosis not present

## 2016-05-31 DIAGNOSIS — H43811 Vitreous degeneration, right eye: Secondary | ICD-10-CM | POA: Diagnosis not present

## 2016-05-31 DIAGNOSIS — Z961 Presence of intraocular lens: Secondary | ICD-10-CM | POA: Diagnosis not present

## 2016-05-31 DIAGNOSIS — H353132 Nonexudative age-related macular degeneration, bilateral, intermediate dry stage: Secondary | ICD-10-CM | POA: Diagnosis not present

## 2016-05-31 DIAGNOSIS — H35371 Puckering of macula, right eye: Secondary | ICD-10-CM | POA: Diagnosis not present

## 2016-06-05 ENCOUNTER — Other Ambulatory Visit: Payer: Self-pay

## 2016-06-18 DIAGNOSIS — S3289XD Fracture of other parts of pelvis, subsequent encounter for fracture with routine healing: Secondary | ICD-10-CM | POA: Diagnosis not present

## 2016-06-18 DIAGNOSIS — M25551 Pain in right hip: Secondary | ICD-10-CM | POA: Diagnosis not present

## 2016-06-18 DIAGNOSIS — R262 Difficulty in walking, not elsewhere classified: Secondary | ICD-10-CM | POA: Diagnosis not present

## 2016-06-20 DIAGNOSIS — R262 Difficulty in walking, not elsewhere classified: Secondary | ICD-10-CM | POA: Diagnosis not present

## 2016-06-20 DIAGNOSIS — M25551 Pain in right hip: Secondary | ICD-10-CM | POA: Diagnosis not present

## 2016-06-20 DIAGNOSIS — S3289XD Fracture of other parts of pelvis, subsequent encounter for fracture with routine healing: Secondary | ICD-10-CM | POA: Diagnosis not present

## 2016-06-25 DIAGNOSIS — S3289XD Fracture of other parts of pelvis, subsequent encounter for fracture with routine healing: Secondary | ICD-10-CM | POA: Diagnosis not present

## 2016-06-25 DIAGNOSIS — M25551 Pain in right hip: Secondary | ICD-10-CM | POA: Diagnosis not present

## 2016-06-25 DIAGNOSIS — R262 Difficulty in walking, not elsewhere classified: Secondary | ICD-10-CM | POA: Diagnosis not present

## 2016-06-26 DIAGNOSIS — Z23 Encounter for immunization: Secondary | ICD-10-CM | POA: Diagnosis not present

## 2016-06-27 DIAGNOSIS — R262 Difficulty in walking, not elsewhere classified: Secondary | ICD-10-CM | POA: Diagnosis not present

## 2016-06-27 DIAGNOSIS — M25551 Pain in right hip: Secondary | ICD-10-CM | POA: Diagnosis not present

## 2016-06-27 DIAGNOSIS — S3289XD Fracture of other parts of pelvis, subsequent encounter for fracture with routine healing: Secondary | ICD-10-CM | POA: Diagnosis not present

## 2016-07-06 DIAGNOSIS — R262 Difficulty in walking, not elsewhere classified: Secondary | ICD-10-CM | POA: Diagnosis not present

## 2016-07-06 DIAGNOSIS — M25551 Pain in right hip: Secondary | ICD-10-CM | POA: Diagnosis not present

## 2016-07-06 DIAGNOSIS — S3289XD Fracture of other parts of pelvis, subsequent encounter for fracture with routine healing: Secondary | ICD-10-CM | POA: Diagnosis not present

## 2016-07-09 DIAGNOSIS — S3289XD Fracture of other parts of pelvis, subsequent encounter for fracture with routine healing: Secondary | ICD-10-CM | POA: Diagnosis not present

## 2016-07-09 DIAGNOSIS — M25551 Pain in right hip: Secondary | ICD-10-CM | POA: Diagnosis not present

## 2016-07-09 DIAGNOSIS — R262 Difficulty in walking, not elsewhere classified: Secondary | ICD-10-CM | POA: Diagnosis not present

## 2016-07-13 DIAGNOSIS — R262 Difficulty in walking, not elsewhere classified: Secondary | ICD-10-CM | POA: Diagnosis not present

## 2016-07-13 DIAGNOSIS — S3289XD Fracture of other parts of pelvis, subsequent encounter for fracture with routine healing: Secondary | ICD-10-CM | POA: Diagnosis not present

## 2016-07-13 DIAGNOSIS — M25551 Pain in right hip: Secondary | ICD-10-CM | POA: Diagnosis not present

## 2016-07-16 DIAGNOSIS — R262 Difficulty in walking, not elsewhere classified: Secondary | ICD-10-CM | POA: Diagnosis not present

## 2016-07-16 DIAGNOSIS — S3289XD Fracture of other parts of pelvis, subsequent encounter for fracture with routine healing: Secondary | ICD-10-CM | POA: Diagnosis not present

## 2016-07-16 DIAGNOSIS — M25551 Pain in right hip: Secondary | ICD-10-CM | POA: Diagnosis not present

## 2017-01-10 DIAGNOSIS — M810 Age-related osteoporosis without current pathological fracture: Secondary | ICD-10-CM | POA: Diagnosis not present

## 2017-01-10 DIAGNOSIS — E559 Vitamin D deficiency, unspecified: Secondary | ICD-10-CM | POA: Diagnosis not present

## 2017-01-10 DIAGNOSIS — E039 Hypothyroidism, unspecified: Secondary | ICD-10-CM | POA: Diagnosis not present

## 2017-01-10 DIAGNOSIS — Z79899 Other long term (current) drug therapy: Secondary | ICD-10-CM | POA: Diagnosis not present

## 2017-01-10 DIAGNOSIS — K210 Gastro-esophageal reflux disease with esophagitis: Secondary | ICD-10-CM | POA: Diagnosis not present

## 2017-01-10 DIAGNOSIS — E1165 Type 2 diabetes mellitus with hyperglycemia: Secondary | ICD-10-CM | POA: Diagnosis not present

## 2017-01-10 DIAGNOSIS — Z0000 Encounter for general adult medical examination without abnormal findings: Secondary | ICD-10-CM | POA: Diagnosis not present

## 2017-01-10 DIAGNOSIS — E782 Mixed hyperlipidemia: Secondary | ICD-10-CM | POA: Diagnosis not present

## 2017-01-24 DIAGNOSIS — G8929 Other chronic pain: Secondary | ICD-10-CM | POA: Diagnosis not present

## 2017-01-24 DIAGNOSIS — M170 Bilateral primary osteoarthritis of knee: Secondary | ICD-10-CM | POA: Diagnosis not present

## 2017-02-28 DIAGNOSIS — M170 Bilateral primary osteoarthritis of knee: Secondary | ICD-10-CM | POA: Diagnosis not present

## 2017-03-07 DIAGNOSIS — Z01818 Encounter for other preprocedural examination: Secondary | ICD-10-CM | POA: Diagnosis not present

## 2017-03-07 DIAGNOSIS — M1712 Unilateral primary osteoarthritis, left knee: Secondary | ICD-10-CM | POA: Diagnosis not present

## 2017-03-07 DIAGNOSIS — M1711 Unilateral primary osteoarthritis, right knee: Secondary | ICD-10-CM | POA: Diagnosis not present

## 2017-03-14 ENCOUNTER — Other Ambulatory Visit: Payer: Self-pay | Admitting: Orthopedic Surgery

## 2017-04-11 NOTE — Pre-Procedure Instructions (Signed)
[name]  04/11/2017      [facility] [place], [place] [address] [place] [address] Phone: [phone] Fax: [phone]    Your procedure is scheduled on Mon. July 16  Report to [facility] Admitting at 7:15 A.M.  Call this number if you have problems the morning of surgery:  [phone]   Remember:  Do not eat food or drink liquids after midnight on sun. July 15  Take these medicines the morning of surgery with A SIP OF WATER : tylenol if needed, omeprazole (prilosec), eye drops               1 week prior to surgery stop: advil, motrin, aleve, ibuprofen, BC Powders, Goody's, mobic, vitamins and herbal medicines:fish oil   Do not wear jewelry, make-up or nail polish.  Do not wear lotions, powders, or perfumes, or deoderant.  Do not shave 48 hours prior to surgery.  Men may shave face and neck.  Do not bring valuables to the hospital.  [facility] is not responsible for any belongings or valuables.  Contacts, dentures or bridgework may not be worn into surgery.  Leave your suitcase in the car.  After surgery it may be brought to your room.  For patients admitted to the hospital, discharge time will be determined by your treatment team.  Patients discharged the day of surgery will not be allowed to drive home.    Special instructions:   [name]- Preparing For Surgery  Before surgery, you can play an important role. Because skin is not sterile, your skin needs to be as free of germs as possible. You can reduce the number of germs on your skin by washing with CHG (chlorahexidine gluconate) Soap before surgery.  CHG is an antiseptic cleaner which kills germs and bonds with the skin to continue killing germs even after washing.  Please do not use if you have an allergy to CHG or antibacterial soaps. If your skin becomes reddened/irritated stop using the CHG.  Do not shave (including legs and underarms) for at  least 48 hours prior to first CHG shower. It is OK to shave your face.  Please follow these instructions carefully.   1. Shower the NIGHT BEFORE SURGERY and the MORNING OF SURGERY with CHG.   2. If you chose to wash your hair, wash your hair first as usual with your normal shampoo.  3. After you shampoo, rinse your hair and body thoroughly to remove the shampoo.  4. Use CHG as you would any other liquid soap. You can apply CHG directly to the skin and wash gently with a scrungie or a clean washcloth.   5. Apply the CHG Soap to your body ONLY FROM THE NECK DOWN.  Do not use on open wounds or open sores. Avoid contact with your eyes, ears, mouth and genitals (private parts). Wash genitals (private parts) with your normal soap.  6. Wash thoroughly, paying special attention to the area where your surgery will be performed.  7. Thoroughly rinse your body with warm water from the neck down.  8. DO NOT shower/wash with your normal soap after using and rinsing off the CHG Soap.  9. Pat yourself dry with a CLEAN TOWEL.   10. Wear CLEAN PAJAMAS   11. Place CLEAN SHEETS on your bed the night of your first shower and DO NOT SLEEP WITH PETS.    Day of Surgery: Do not  apply any deodorants/lotions. Please wear clean clothes to the hospital/surgery center.      Please read over the following fact sheets that you were given. Coughing and Deep Breathing, MRSA Information and Surgical Site Infection Prevention

## 2017-04-12 ENCOUNTER — Encounter (HOSPITAL_COMMUNITY)
Admission: RE | Admit: 2017-04-12 | Discharge: 2017-04-12 | Disposition: A | Discharge status: Home / Self Care | Payer: Medicare Other | Source: Ambulatory Visit | Attending: Orthopedic Surgery | Admitting: Orthopedic Surgery

## 2017-04-12 ENCOUNTER — Encounter (HOSPITAL_COMMUNITY): Payer: Self-pay | Admitting: *Deleted

## 2017-04-12 DIAGNOSIS — Z01812 Encounter for preprocedural laboratory examination: Secondary | ICD-10-CM | POA: Insufficient documentation

## 2017-04-12 DIAGNOSIS — M1990 Unspecified osteoarthritis, unspecified site: Secondary | ICD-10-CM | POA: Diagnosis not present

## 2017-04-12 DIAGNOSIS — M1711 Unilateral primary osteoarthritis, right knee: Secondary | ICD-10-CM | POA: Diagnosis not present

## 2017-04-12 DIAGNOSIS — M11261 Other chondrocalcinosis, right knee: Secondary | ICD-10-CM | POA: Diagnosis not present

## 2017-04-12 DIAGNOSIS — M2341 Loose body in knee, right knee: Secondary | ICD-10-CM | POA: Diagnosis not present

## 2017-04-12 HISTORY — DX: Unspecified osteoarthritis, unspecified site: M19.90

## 2017-04-12 HISTORY — DX: Gastro-esophageal reflux disease without esophagitis: K21.9

## 2017-04-12 HISTORY — DX: Essential (primary) hypertension: I10

## 2017-04-12 LAB — COMPREHENSIVE METABOLIC PANEL
ALT: 19 U/L (ref 14–54)
AST: 29 U/L (ref 15–41)
Albumin: 3.9 g/dL (ref 3.5–5.0)
Alkaline Phosphatase: 56 U/L (ref 38–126)
Anion gap: 8 (ref 5–15)
BILIRUBIN TOTAL: 0.7 mg/dL (ref 0.3–1.2)
BUN: 19 mg/dL (ref 6–20)
CO2: 25 mmol/L (ref 22–32)
CREATININE: 0.89 mg/dL (ref 0.44–1.00)
Calcium: 9.9 mg/dL (ref 8.9–10.3)
Chloride: 105 mmol/L (ref 101–111)
GFR calc Af Amer: >60 mL/min (ref >60)
GFR, EST NON AFRICAN AMERICAN: 55 mL/min — AB (ref >60)
Glucose, Bld: 83 mg/dL (ref 65–99)
POTASSIUM: 5.4 mmol/L — AB (ref 3.5–5.1)
Sodium: 138 mmol/L (ref 135–145)
TOTAL PROTEIN: 6.7 g/dL (ref 6.5–8.1)

## 2017-04-12 LAB — CBC WITH DIFFERENTIAL/PLATELET
BASOS ABS: 0.1 10*3/uL (ref 0.0–0.1)
Basophils Relative: 1 %
Eosinophils Absolute: 0.1 10*3/uL (ref 0.0–0.7)
Eosinophils Relative: 1 %
HEMATOCRIT: 38.2 % (ref 36.0–46.0)
Hemoglobin: 12 g/dL (ref 12.0–15.0)
LYMPHS ABS: 1.6 10*3/uL (ref 0.7–4.0)
LYMPHS PCT: 26 %
MCH: 31.7 pg (ref 26.0–34.0)
MCHC: 31.4 g/dL (ref 30.0–36.0)
MCV: 100.8 fL — AB (ref 78.0–100.0)
MONO ABS: 0.6 10*3/uL (ref 0.1–1.0)
MONOS PCT: 9 %
NEUTROS ABS: 3.8 10*3/uL (ref 1.7–7.7)
Neutrophils Relative %: 63 %
Platelets: 255 10*3/uL (ref 150–400)
RBC: 3.79 MIL/uL — ABNORMAL LOW (ref 3.87–5.11)
RDW: 14 % (ref 11.5–15.5)
WBC: 6 10*3/uL (ref 4.0–10.5)

## 2017-04-12 LAB — SURGICAL PCR SCREEN
MRSA, PCR: NEGATIVE
STAPHYLOCOCCUS AUREUS: POSITIVE — AB

## 2017-04-12 NOTE — Progress Notes (Addendum)
PCP:Dr. [name] [facility] --request ekg/office notes/clearance note  Mupirocin scrip called to [facility] in [facility]

## 2017-04-19 MED ORDER — CEFAZOLIN SODIUM-DEXTROSE 2-4 GM/100ML-% IV SOLN
2.0000 g | INTRAVENOUS | Status: AC
  Administered 2017-04-22: 2 g via INTRAVENOUS
  Filled 2017-04-19: qty 100

## 2017-04-19 MED ORDER — TRANEXAMIC ACID 1000 MG/10ML IV SOLN
1000.0000 mg | INTRAVENOUS | Status: AC
  Administered 2017-04-22: 1000 mg via INTRAVENOUS
  Filled 2017-04-19: qty 10

## 2017-04-19 MED ORDER — BUPIVACAINE LIPOSOME 1.3 % IJ SUSP
20.0000 mL | INTRAMUSCULAR | Status: DC
  Filled 2017-04-19: qty 20

## 2017-04-19 MED ORDER — GABAPENTIN 300 MG PO CAPS
300.0000 mg | ORAL_CAPSULE | Freq: Once | ORAL | Status: AC
  Administered 2017-04-22: 300 mg via ORAL
  Filled 2017-04-19: qty 1

## 2017-04-19 MED ORDER — DEXAMETHASONE SODIUM PHOSPHATE 10 MG/ML IJ SOLN
8.0000 mg | Freq: Once | INTRAMUSCULAR | Status: AC
  Administered 2017-04-22: 5 mg via INTRAVENOUS
  Filled 2017-04-19: qty 1

## 2017-04-19 MED ORDER — ACETAMINOPHEN 500 MG PO TABS
1000.0000 mg | ORAL_TABLET | Freq: Once | ORAL | Status: AC
  Administered 2017-04-22: 1000 mg via ORAL
  Filled 2017-04-19: qty 2

## 2017-04-21 NOTE — H&P (Signed)
[name] MRN:  [id number] DOB/SEX:  [birth year]/female  CHIEF COMPLAINT:  Painful right Knee  HISTORY: Patient is a 83 y.o. female presented with a history of pain in the right knee. Onset of symptoms was gradual starting a few years ago with gradually worsening course since that time. Patient has been treated conservatively with over-the-counter NSAIDs and activity modification. Patient currently rates pain in the knee at 10 out of 10 with activity. There is pain at night.  PAST MEDICAL HISTORY: There are no active problems to display for this patient.  Past Medical History:  Diagnosis Date  . Arthritis   . GERD (gastroesophageal reflux disease)   . Headache     migraines 5-6 a month, last 30 mins.  . Hypertension    Past Surgical History:  Procedure Laterality Date  . ABDOMINAL HYSTERECTOMY    . CARPAL TUNNEL RELEASE Right   . CHOLECYSTECTOMY    . TONSILLECTOMY     as child, adnoids     MEDICATIONS:   No prescriptions prior to admission.    ALLERGIES:   Allergies  Allergen Reactions  . Sulfamethoxazole Diarrhea and Other (See Comments)    GI UPSET    REVIEW OF SYSTEMS:  A comprehensive review of systems was negative except for: Musculoskeletal: positive for arthralgias and bone pain   FAMILY HISTORY:  No family history on file.  SOCIAL HISTORY:   Social History  Substance Use Topics  . Smoking status: Never Smoker  . Smokeless tobacco: Never Used  . Alcohol use Yes     Comment: occasionally     EXAMINATION:  Vital signs in last 24 hours:    There were no vitals taken for this visit.  General Appearance:    Alert, cooperative, no distress, appears stated age  Head:    Normocephalic, without obvious abnormality, atraumatic  Eyes:    PERRL, conjunctiva/corneas clear, EOM's intact, fundi    benign, both eyes  Ears:    Normal TM's and external ear canals, both ears  Nose:   Nares normal, septum midline, mucosa normal, no drainage    or sinus  tenderness  Throat:   Lips, mucosa, and tongue normal; teeth and gums normal  Neck:   Supple, symmetrical, trachea midline, no adenopathy;    thyroid:  no enlargement/tenderness/nodules; no carotid   bruit or JVD  Back:     Symmetric, no curvature, ROM normal, no CVA tenderness  Lungs:     Clear to auscultation bilaterally, respirations unlabored  Chest Wall:    No tenderness or deformity   Heart:    Regular rate and rhythm, S1 and S2 normal, no murmur, rub   or gallop  Breast Exam:    No tenderness, masses, or nipple abnormality  Abdomen:     Soft, non-tender, bowel sounds active all four quadrants,    no masses, no organomegaly  Genitalia:    Normal female without lesion, discharge or tenderness  Rectal:    Normal tone, no masses or tenderness;   guaiac negative stool  Extremities:   Extremities normal, atraumatic, no cyanosis or edema  Pulses:   2+ and symmetric all extremities  Skin:   Skin color, texture, turgor normal, no rashes or lesions  Lymph nodes:   Cervical, supraclavicular, and axillary nodes normal  Neurologic:   CNII-XII intact, normal strength, sensation and reflexes    throughout    Musculoskeletal:  ROM 0-120, Ligaments intact,  Imaging Review Plain radiographs demonstrate severe degenerative joint disease of the  right knee. The overall alignment is neutral. The bone quality appears to be excellent for age and reported activity level.  Assessment/Plan: Primary osteoarthritis, right knee   The patient history, physical examination and imaging studies are consistent with advanced degenerative joint disease of the right knee. The patient has failed conservative treatment.  The clearance notes were reviewed.  After discussion with the patient it was felt that Total Knee Replacement was indicated. The procedure,  risks, and benefits of total knee arthroplasty were presented and reviewed. The risks including but not limited to aseptic loosening, infection, blood clots,  vascular injury, stiffness, patella tracking problems complications among others were discussed. The patient acknowledged the explanation, agreed to proceed with the plan.  Donia Ast 04/21/2017, 8:42 PM

## 2017-04-22 ENCOUNTER — Observation Stay (HOSPITAL_COMMUNITY)
Admission: RE | Admit: 2017-04-22 | Discharge: 2017-04-23 | Disposition: A | Discharge status: Home / Self Care | Payer: Medicare Other | Source: Ambulatory Visit | Attending: Orthopedic Surgery | Admitting: Orthopedic Surgery

## 2017-04-22 ENCOUNTER — Ambulatory Visit (HOSPITAL_COMMUNITY): Payer: Medicare Other | Admitting: Anesthesiology

## 2017-04-22 ENCOUNTER — Encounter (HOSPITAL_COMMUNITY)
Admission: RE | Disposition: A | Discharge status: Home / Self Care | Payer: Self-pay | Source: Ambulatory Visit | Attending: Orthopedic Surgery

## 2017-04-22 DIAGNOSIS — E785 Hyperlipidemia, unspecified: Secondary | ICD-10-CM | POA: Diagnosis not present

## 2017-04-22 DIAGNOSIS — Z79899 Other long term (current) drug therapy: Secondary | ICD-10-CM | POA: Insufficient documentation

## 2017-04-22 DIAGNOSIS — K219 Gastro-esophageal reflux disease without esophagitis: Secondary | ICD-10-CM | POA: Insufficient documentation

## 2017-04-22 DIAGNOSIS — Z7983 Long term (current) use of bisphosphonates: Secondary | ICD-10-CM | POA: Diagnosis not present

## 2017-04-22 DIAGNOSIS — M1711 Unilateral primary osteoarthritis, right knee: Secondary | ICD-10-CM | POA: Diagnosis not present

## 2017-04-22 DIAGNOSIS — G8918 Other acute postprocedural pain: Secondary | ICD-10-CM | POA: Diagnosis not present

## 2017-04-22 DIAGNOSIS — I10 Essential (primary) hypertension: Secondary | ICD-10-CM | POA: Diagnosis not present

## 2017-04-22 DIAGNOSIS — Z96659 Presence of unspecified artificial knee joint: Secondary | ICD-10-CM

## 2017-04-22 HISTORY — DX: Pure hypercholesterolemia, unspecified: E78.00

## 2017-04-22 HISTORY — DX: Unspecified macular degeneration: H35.30

## 2017-04-22 HISTORY — DX: Personal history of (healed) traumatic fracture: Z87.81

## 2017-04-22 HISTORY — DX: Migraine, unspecified, not intractable, without status migrainosus: G43.909

## 2017-04-22 HISTORY — DX: Type 2 diabetes mellitus without complications: E11.9

## 2017-04-22 HISTORY — PX: TOTAL KNEE ARTHROPLASTY: SHX125

## 2017-04-22 SURGERY — ARTHROPLASTY, KNEE, TOTAL
Anesthesia: Regional | Site: Knee | Laterality: Right

## 2017-04-22 MED ORDER — BUPIVACAINE-EPINEPHRINE (PF) 0.25% -1:200000 IJ SOLN
INTRAMUSCULAR | Status: DC | PRN
  Administered 2017-04-22: 30 mL

## 2017-04-22 MED ORDER — LISINOPRIL 20 MG PO TABS
20.0000 mg | ORAL_TABLET | Freq: Every day | ORAL | Status: DC
  Administered 2017-04-23: 20 mg via ORAL
  Filled 2017-04-22: qty 1

## 2017-04-22 MED ORDER — CEFAZOLIN SODIUM-DEXTROSE 1-4 GM/50ML-% IV SOLN
1.0000 g | Freq: Four times a day (QID) | INTRAVENOUS | Status: AC
  Administered 2017-04-22 (×2): 1 g via INTRAVENOUS
  Filled 2017-04-22 (×2): qty 50

## 2017-04-22 MED ORDER — FENTANYL CITRATE (PF) 250 MCG/5ML IJ SOLN
INTRAMUSCULAR | Status: AC
  Filled 2017-04-22: qty 5

## 2017-04-22 MED ORDER — PHENOL 1.4 % MT LIQD: 1.0000 | OROMUCOSAL | Status: DC | PRN

## 2017-04-22 MED ORDER — PHENYLEPHRINE 40 MCG/ML (10ML) SYRINGE FOR IV PUSH (FOR BLOOD PRESSURE SUPPORT)
PREFILLED_SYRINGE | INTRAVENOUS | Status: DC | PRN
  Administered 2017-04-22 (×2): 80 ug via INTRAVENOUS

## 2017-04-22 MED ORDER — SODIUM CHLORIDE 0.9 % IJ SOLN
INTRAMUSCULAR | Status: DC | PRN
  Administered 2017-04-22: 20 mL

## 2017-04-22 MED ORDER — FENTANYL CITRATE (PF) 100 MCG/2ML IJ SOLN
INTRAMUSCULAR | Status: AC
  Administered 2017-04-22: 100 ug
  Filled 2017-04-22: qty 2

## 2017-04-22 MED ORDER — TRAMADOL HCL 50 MG PO TABS
50.0000 mg | ORAL_TABLET | Freq: Four times a day (QID) | ORAL | Status: DC
  Administered 2017-04-22: 50 mg via ORAL
  Administered 2017-04-23 (×2): 100 mg via ORAL
  Administered 2017-04-23: 50 mg via ORAL
  Filled 2017-04-22: qty 1
  Filled 2017-04-22: qty 2
  Filled 2017-04-22: qty 1
  Filled 2017-04-22 (×2): qty 2

## 2017-04-22 MED ORDER — CHLORHEXIDINE GLUCONATE 4 % EX LIQD: 60.0000 mL | Freq: Once | CUTANEOUS | Status: DC

## 2017-04-22 MED ORDER — ACETAMINOPHEN 325 MG PO TABS: 650.0000 mg | ORAL_TABLET | Freq: Four times a day (QID) | ORAL | Status: DC | PRN

## 2017-04-22 MED ORDER — HYDROCODONE-ACETAMINOPHEN 5-325 MG PO TABS: 1.0000 | ORAL_TABLET | ORAL | Status: DC | PRN

## 2017-04-22 MED ORDER — ZOLPIDEM TARTRATE 5 MG PO TABS: 5.0000 mg | ORAL_TABLET | Freq: Every evening | ORAL | Status: DC | PRN

## 2017-04-22 MED ORDER — BISACODYL 5 MG PO TBEC: 5.0000 mg | DELAYED_RELEASE_TABLET | Freq: Every day | ORAL | Status: DC | PRN

## 2017-04-22 MED ORDER — ALUM & MAG HYDROXIDE-SIMETH 200-200-20 MG/5ML PO SUSP: 30.0000 mL | ORAL | Status: DC | PRN

## 2017-04-22 MED ORDER — PROPOFOL 10 MG/ML IV BOLUS
INTRAVENOUS | Status: DC | PRN
  Administered 2017-04-22: 10 mg via INTRAVENOUS
  Administered 2017-04-22: 20 mg via INTRAVENOUS
  Administered 2017-04-22: 10 mg via INTRAVENOUS

## 2017-04-22 MED ORDER — PROPOFOL 10 MG/ML IV BOLUS
INTRAVENOUS | Status: AC
  Filled 2017-04-22: qty 20

## 2017-04-22 MED ORDER — 0.9 % SODIUM CHLORIDE (POUR BTL) OPTIME
TOPICAL | Status: DC | PRN
  Administered 2017-04-22: 1000 mL

## 2017-04-22 MED ORDER — PHENYLEPHRINE 40 MCG/ML (10ML) SYRINGE FOR IV PUSH (FOR BLOOD PRESSURE SUPPORT)
PREFILLED_SYRINGE | INTRAVENOUS | Status: AC
  Filled 2017-04-22: qty 10

## 2017-04-22 MED ORDER — MENTHOL 3 MG MT LOZG: 1.0000 | LOZENGE | OROMUCOSAL | Status: DC | PRN

## 2017-04-22 MED ORDER — SENNOSIDES-DOCUSATE SODIUM 8.6-50 MG PO TABS: 1.0000 | ORAL_TABLET | Freq: Every evening | ORAL | Status: DC | PRN

## 2017-04-22 MED ORDER — ONDANSETRON HCL 4 MG/2ML IJ SOLN
INTRAMUSCULAR | Status: DC | PRN
  Administered 2017-04-22: 4 mg via INTRAVENOUS

## 2017-04-22 MED ORDER — ONDANSETRON HCL 4 MG/2ML IJ SOLN: 4.0000 mg | Freq: Once | INTRAMUSCULAR | Status: DC | PRN

## 2017-04-22 MED ORDER — METOCLOPRAMIDE HCL 5 MG PO TABS: 5.0000 mg | ORAL_TABLET | Freq: Three times a day (TID) | ORAL | Status: DC | PRN

## 2017-04-22 MED ORDER — FENTANYL CITRATE (PF) 100 MCG/2ML IJ SOLN: 25.0000 ug | INTRAMUSCULAR | Status: DC | PRN

## 2017-04-22 MED ORDER — SODIUM CHLORIDE 0.9 % IR SOLN
Status: DC | PRN
  Administered 2017-04-22: 3000 mL

## 2017-04-22 MED ORDER — ATORVASTATIN CALCIUM 20 MG PO TABS
20.0000 mg | ORAL_TABLET | Freq: Every day | ORAL | Status: DC
  Administered 2017-04-22: 20 mg via ORAL
  Filled 2017-04-22: qty 1

## 2017-04-22 MED ORDER — PANTOPRAZOLE SODIUM 40 MG PO TBEC
40.0000 mg | DELAYED_RELEASE_TABLET | Freq: Every day | ORAL | Status: DC
  Administered 2017-04-23: 40 mg via ORAL
  Filled 2017-04-22: qty 1

## 2017-04-22 MED ORDER — DEXMEDETOMIDINE HCL IN NACL 200 MCG/50ML IV SOLN
INTRAVENOUS | Status: DC | PRN
  Administered 2017-04-22: 8 ug via INTRAVENOUS
  Administered 2017-04-22 (×2): 4 ug via INTRAVENOUS

## 2017-04-22 MED ORDER — MAGNESIUM OXIDE 400 (241.3 MG) MG PO TABS
200.0000 mg | ORAL_TABLET | Freq: Every day | ORAL | Status: DC
  Administered 2017-04-22: 200 mg via ORAL
  Filled 2017-04-22: qty 1

## 2017-04-22 MED ORDER — PROPOFOL 500 MG/50ML IV EMUL
INTRAVENOUS | Status: DC | PRN
  Administered 2017-04-22: 40 ug/kg/min via INTRAVENOUS

## 2017-04-22 MED ORDER — ONDANSETRON HCL 4 MG/2ML IJ SOLN: 4.0000 mg | Freq: Four times a day (QID) | INTRAMUSCULAR | Status: DC | PRN

## 2017-04-22 MED ORDER — LACTATED RINGERS IV SOLN
INTRAVENOUS | Status: DC
Start: 2017-04-22 — End: 2017-04-22
  Administered 2017-04-22 (×2): via INTRAVENOUS

## 2017-04-22 MED ORDER — METOCLOPRAMIDE HCL 5 MG/ML IJ SOLN: 5.0000 mg | Freq: Three times a day (TID) | INTRAMUSCULAR | Status: DC | PRN

## 2017-04-22 MED ORDER — METHOCARBAMOL 500 MG PO TABS: 500.0000 mg | ORAL_TABLET | Freq: Four times a day (QID) | ORAL | Status: DC | PRN

## 2017-04-22 MED ORDER — BUPIVACAINE IN DEXTROSE 0.75-8.25 % IT SOLN
INTRATHECAL | Status: DC | PRN
  Administered 2017-04-22: 1.6 mL via INTRATHECAL

## 2017-04-22 MED ORDER — DEXMEDETOMIDINE HCL IN NACL 200 MCG/50ML IV SOLN
INTRAVENOUS | Status: AC
  Filled 2017-04-22: qty 50

## 2017-04-22 MED ORDER — ONDANSETRON HCL 4 MG/2ML IJ SOLN
INTRAMUSCULAR | Status: AC
  Filled 2017-04-22: qty 2

## 2017-04-22 MED ORDER — ACETAMINOPHEN 650 MG RE SUPP: 650.0000 mg | Freq: Four times a day (QID) | RECTAL | Status: DC | PRN

## 2017-04-22 MED ORDER — ROPIVACAINE HCL 5 MG/ML IJ SOLN
INTRAMUSCULAR | Status: DC | PRN
  Administered 2017-04-22: 30 mL via PERINEURAL

## 2017-04-22 MED ORDER — EPINEPHRINE PF 1 MG/ML IJ SOLN
INTRAMUSCULAR | Status: AC
  Filled 2017-04-22: qty 1

## 2017-04-22 MED ORDER — DIPHENHYDRAMINE HCL 12.5 MG/5ML PO ELIX: 12.5000 mg | ORAL_SOLUTION | ORAL | Status: DC | PRN

## 2017-04-22 MED ORDER — BUPIVACAINE HCL (PF) 0.25 % IJ SOLN
INTRAMUSCULAR | Status: AC
  Filled 2017-04-22: qty 30

## 2017-04-22 MED ORDER — DEXAMETHASONE SODIUM PHOSPHATE 10 MG/ML IJ SOLN
10.0000 mg | Freq: Once | INTRAMUSCULAR | Status: AC
  Administered 2017-04-23: 10 mg via INTRAVENOUS
  Filled 2017-04-22: qty 1

## 2017-04-22 MED ORDER — ASPIRIN EC 325 MG PO TBEC
325.0000 mg | DELAYED_RELEASE_TABLET | Freq: Two times a day (BID) | ORAL | Status: DC
  Administered 2017-04-22 – 2017-04-23 (×2): 325 mg via ORAL
  Filled 2017-04-22 (×2): qty 1

## 2017-04-22 MED ORDER — FLEET ENEMA 7-19 GM/118ML RE ENEM: 1.0000 | ENEMA | Freq: Once | RECTAL | Status: DC | PRN

## 2017-04-22 MED ORDER — SODIUM CHLORIDE 0.9 % IV SOLN
1000.0000 mg | Freq: Once | INTRAVENOUS | Status: AC
  Administered 2017-04-22: 1000 mg via INTRAVENOUS
  Filled 2017-04-22 (×2): qty 10

## 2017-04-22 MED ORDER — ONDANSETRON HCL 4 MG PO TABS: 4.0000 mg | ORAL_TABLET | Freq: Four times a day (QID) | ORAL | Status: DC | PRN

## 2017-04-22 MED ORDER — METHOCARBAMOL 1000 MG/10ML IJ SOLN
500.0000 mg | Freq: Four times a day (QID) | INTRAVENOUS | Status: DC | PRN
  Filled 2017-04-22: qty 5

## 2017-04-22 MED ORDER — HYDROMORPHONE HCL 1 MG/ML IJ SOLN: 0.5000 mg | INTRAMUSCULAR | Status: DC | PRN

## 2017-04-22 MED ORDER — DOCUSATE SODIUM 100 MG PO CAPS
100.0000 mg | ORAL_CAPSULE | Freq: Two times a day (BID) | ORAL | Status: DC
  Administered 2017-04-22 – 2017-04-23 (×2): 100 mg via ORAL
  Filled 2017-04-22 (×2): qty 1

## 2017-04-22 SURGICAL SUPPLY — 64 items
BANDAGE ACE 6X5 VEL STRL LF (GAUZE/BANDAGES/DRESSINGS) ×3 IMPLANT
BANDAGE ESMARK 6X9 LF (GAUZE/BANDAGES/DRESSINGS) ×1 IMPLANT
BLADE SAGITTAL 13X1.27X60 (BLADE) ×2 IMPLANT
BLADE SAGITTAL 13X1.27X60MM (BLADE) ×1
BLADE SAW SGTL 83.5X18.5 (BLADE) ×3 IMPLANT
BLADE SURG 10 STRL SS (BLADE) ×3 IMPLANT
BNDG CMPR 9X6 STRL LF SNTH (GAUZE/BANDAGES/DRESSINGS) ×1
BNDG ESMARK 6X9 LF (GAUZE/BANDAGES/DRESSINGS) ×3
BOWL SMART MIX CTS (DISPOSABLE) ×3 IMPLANT
CAPT KNEE TOTAL 3 ×3 IMPLANT
CEMENT BONE SIMPLEX SPEEDSET (Cement) ×6 IMPLANT
CLOSURE STERI-STRIP 1/2X4 (GAUZE/BANDAGES/DRESSINGS) ×1
CLOSURE WOUND 1/2 X4 (GAUZE/BANDAGES/DRESSINGS) ×1
CLSR STERI-STRIP ANTIMIC 1/2X4 (GAUZE/BANDAGES/DRESSINGS) ×1 IMPLANT
COVER SURGICAL LIGHT HANDLE (MISCELLANEOUS) ×3 IMPLANT
CUFF TOURNIQUET SINGLE 34IN LL (TOURNIQUET CUFF) ×3 IMPLANT
DRAPE EXTREMITY T 121X128X90 (DRAPE) ×3 IMPLANT
DRAPE HALF SHEET 40X57 (DRAPES) ×3 IMPLANT
DRAPE INCISE IOBAN 66X45 STRL (DRAPES) ×6 IMPLANT
DRAPE U-SHAPE 47X51 STRL (DRAPES) ×3 IMPLANT
DRSG AQUACEL AG ADV 3.5X10 (GAUZE/BANDAGES/DRESSINGS) ×3 IMPLANT
DURAPREP 26ML APPLICATOR (WOUND CARE) ×6 IMPLANT
ELECT REM PT RETURN 9FT ADLT (ELECTROSURGICAL) ×3
ELECTRODE REM PT RTRN 9FT ADLT (ELECTROSURGICAL) ×1 IMPLANT
FILTER STRAW FLUID ASPIR (MISCELLANEOUS) IMPLANT
GLOVE BIOGEL M 7.0 STRL (GLOVE) IMPLANT
GLOVE BIOGEL PI IND STRL 7.5 (GLOVE) IMPLANT
GLOVE BIOGEL PI IND STRL 8.5 (GLOVE) ×1 IMPLANT
GLOVE BIOGEL PI INDICATOR 7.5 (GLOVE)
GLOVE BIOGEL PI INDICATOR 8.5 (GLOVE) ×2
GLOVE SURG ORTHO 8.0 STRL STRW (GLOVE) ×6 IMPLANT
GOWN STRL REUS W/ TWL LRG LVL3 (GOWN DISPOSABLE) ×1 IMPLANT
GOWN STRL REUS W/ TWL XL LVL3 (GOWN DISPOSABLE) ×2 IMPLANT
GOWN STRL REUS W/TWL 2XL LVL3 (GOWN DISPOSABLE) ×3 IMPLANT
GOWN STRL REUS W/TWL LRG LVL3 (GOWN DISPOSABLE) ×3
GOWN STRL REUS W/TWL XL LVL3 (GOWN DISPOSABLE) ×6
HANDPIECE INTERPULSE COAX TIP (DISPOSABLE) ×3
HOOD PEEL AWAY FACE SHEILD DIS (HOOD) ×9 IMPLANT
KIT BASIN OR (CUSTOM PROCEDURE TRAY) ×3 IMPLANT
KIT ROOM TURNOVER OR (KITS) ×3 IMPLANT
KNEE CAPITATED TOTAL 3 IMPLANT
MANIFOLD NEPTUNE II (INSTRUMENTS) ×3 IMPLANT
NDL 18GX1X1/2 (RX/OR ONLY) (NEEDLE) IMPLANT
NEEDLE 18GX1X1/2 (RX/OR ONLY) (NEEDLE) IMPLANT
NEEDLE 22X1 1/2 (OR ONLY) (NEEDLE) ×6 IMPLANT
NS IRRIG 1000ML POUR BTL (IV SOLUTION) ×3 IMPLANT
PACK TOTAL JOINT (CUSTOM PROCEDURE TRAY) ×3 IMPLANT
PAD ARMBOARD 7.5X6 YLW CONV (MISCELLANEOUS) ×6 IMPLANT
SET HNDPC FAN SPRY TIP SCT (DISPOSABLE) ×1 IMPLANT
STRIP CLOSURE SKIN 1/2X4 (GAUZE/BANDAGES/DRESSINGS) ×2 IMPLANT
SUCTION FRAZIER HANDLE 10FR (MISCELLANEOUS)
SUCTION TUBE FRAZIER 10FR DISP (MISCELLANEOUS) IMPLANT
SUT MNCRL AB 3-0 PS2 18 (SUTURE) ×3 IMPLANT
SUT VIC AB 0 CTB1 27 (SUTURE) ×6 IMPLANT
SUT VIC AB 1 CT1 27 (SUTURE) ×6
SUT VIC AB 1 CT1 27XBRD ANBCTR (SUTURE) ×2 IMPLANT
SUT VIC AB 2-0 CT1 27 (SUTURE) ×9
SUT VIC AB 2-0 CT1 TAPERPNT 27 (SUTURE) ×2 IMPLANT
SYR 20CC LL (SYRINGE) ×6 IMPLANT
SYR TB 1ML LUER SLIP (SYRINGE) IMPLANT
TOWEL OR 17X24 6PK STRL BLUE (TOWEL DISPOSABLE) ×3 IMPLANT
TOWEL OR 17X26 10 PK STRL BLUE (TOWEL DISPOSABLE) ×3 IMPLANT
TRAY CATH 16FR W/PLASTIC CATH (SET/KITS/TRAYS/PACK) IMPLANT
WRAP KNEE MAXI GEL POST OP (GAUZE/BANDAGES/DRESSINGS) ×3 IMPLANT

## 2017-04-22 NOTE — OR Nursing (Signed)
1150: in&out cath=500 pale cyu, per protocol, no trauma.

## 2017-04-22 NOTE — Anesthesia Preprocedure Evaluation (Addendum)
Anesthesia Evaluation  Patient identified by MRN, date of birth, ID band Patient awake    Reviewed: Allergy & Precautions, NPO status , Patient's Chart, lab work & pertinent test results  Airway Mallampati: III  TM Distance: >3 FB Neck ROM: Full    Dental no notable dental hx.    Pulmonary neg pulmonary ROS,    Pulmonary exam normal breath sounds clear to auscultation       Cardiovascular hypertension, Pt. on medications Normal cardiovascular exam Rhythm:Regular Rate:Normal  ECG: NSR, rate 73   Neuro/Psych  Headaches, negative psych ROS   GI/Hepatic Neg liver ROS, GERD  Medicated and Controlled,  Endo/Other  negative endocrine ROS  Renal/GU negative Renal ROS  negative genitourinary   Musculoskeletal negative musculoskeletal ROS (+)   Abdominal   Peds negative pediatric ROS (+)  Hematology negative hematology ROS (+)   Anesthesia Other Findings Hyperlipidemia   Reproductive/Obstetrics negative OB ROS                            Anesthesia Physical Anesthesia Plan  ASA: II  Anesthesia Plan: Spinal and Regional   Post-op Pain Management:    Induction:   PONV Risk Score and Plan: 2 and Ondansetron, Dexamethasone and Propofol  Airway Management Planned:   Additional Equipment:   Intra-op Plan:   Post-operative Plan:   Informed Consent: I have reviewed the patients History and Physical, chart, labs and discussed the procedure including the risks, benefits and alternatives for the proposed anesthesia with the patient or authorized representative who has indicated his/her understanding and acceptance.   Dental advisory given  Plan Discussed with: CRNA  Anesthesia Plan Comments:        Anesthesia Quick Evaluation

## 2017-04-22 NOTE — Transfer of Care (Signed)
Immediate Anesthesia Transfer of Care Note  Patient: REMMIE BEMBENEK  Procedure(s) Performed: Procedure(s): TOTAL KNEE ARTHROPLASTY (Right)  Patient Location: PACU  Anesthesia Type:Spinal  Level of Consciousness: drowsy and patient cooperative  Airway & Oxygen Therapy: Patient Spontanous Breathing and Patient connected to face mask oxygen  Post-op Assessment: Report given to RN and Post -op Vital signs reviewed and stable  Post vital signs: Reviewed and stable  Last Vitals:  Vitals:   04/22/17 1159 04/22/17 1200  BP: (!) 115/54 (!) 115/54  Pulse: 60 (!) 59  Resp: 12 11  Temp: 36.6 C     Last Pain:  Vitals:   04/22/17 0736  TempSrc: Oral         Complications: No apparent anesthesia complications

## 2017-04-22 NOTE — Anesthesia Procedure Notes (Signed)
Anesthesia Regional Block: Adductor canal block   Pre-Anesthetic Checklist: ,, timeout performed, Correct Patient, Correct Site, Correct Laterality, Correct Procedure,, site marked, risks and benefits discussed, Surgical consent,  Pre-op evaluation,  At surgeon's request and post-op pain management  Laterality: Right  Prep: chloraprep       Needles:  Injection technique: Single-shot  Needle Type: Echogenic Stimulator Needle     Needle Length: 9cm  Needle Gauge: 21     Additional Needles:   Procedures: ultrasound guided,,,,,,,,  Narrative:  Start time: 04/22/2017 8:50 AM End time: 04/22/2017 9:00 AM Injection made incrementally with aspirations every 5 mL.  Performed by: Personally  Anesthesiologist: Adele Barthel P  Additional Notes: Functioning IV was confirmed and monitors were applied.  A 60mm 21ga Arrow echogenic stimulator needle was used. Sterile prep,hand hygiene and sterile gloves were used.  Negative aspiration and negative test dose prior to incremental administration of local anesthetic. The patient tolerated the procedure well.

## 2017-04-22 NOTE — Progress Notes (Signed)
Orthopedic Tech Progress Note Patient Details:  [name] [birth year] [id number] On cpm at [place] Patient ID: [name], female   DOB: [birth year], 83 y.o.   MRN: [id number]   [name] 04/22/2017, 6:36 PM

## 2017-04-22 NOTE — Progress Notes (Signed)
Orthopedic Tech Progress Note Patient Details:  [name] 12/16/1922 [id number]  CPM Right Knee CPM Right Knee: On Right Knee Flexion (Degrees): 9 Right Knee Extension (Degrees): 0 Additional Comments: trapeze bar patient helper   [name] 04/22/2017, 2:16 PM Viewed order from doctor's order list

## 2017-04-22 NOTE — Op Note (Signed)
TOTAL KNEE REPLACEMENT OPERATIVE NOTE:  04/22/2017  2:15 PM  PATIENT:  [name]  83 y.o. female  PRE-OPERATIVE DIAGNOSIS:  primary osteoarthritis right knee  POST-OPERATIVE DIAGNOSIS:  primary osteoarthritis right knee  PROCEDURE:  Procedure(s): TOTAL KNEE ARTHROPLASTY  SURGEON:  Surgeon(s): Vickey Huger, MD  PHYSICIAN ASSISTANT: Carlyon Shadow, [facility]   ANESTHESIA:   spinal  DRAINS: Hemovac  SPECIMEN: None  COUNTS:  Correct  TOURNIQUET:   Total Tourniquet Time Documented: Thigh (Right) - 35 minutes Total: Thigh (Right) - 35 minutes   DICTATION:  Indication for procedure:    The patient is a 83 y.o. female who has failed conservative treatment for primary osteoarthritis right knee.  Informed consent was obtained prior to anesthesia. The risks versus benefits of the operation were explain and in a way the patient can, and did, understand.   On the implant demand matching protocol, this patient scored 8.  Therefore, this patient was not receive a polyethylene insert with vitamin E which is a high demand implant.  Description of procedure:     The patient was taken to the operating room and placed under anesthesia.  The patient was positioned in the usual fashion taking care that all body parts were adequately padded and/or protected.  I foley catheter was not placed.  A tourniquet was applied and the leg prepped and draped in the usual sterile fashion.  The extremity was exsanguinated with the esmarch and tourniquet inflated to 350 mmHg.  Pre-operative range of motion was normal.  The knee was in 5 degree of mild valgus.  A midline incision approximately 6-7 inches long was made with a #10 blade.  A new blade was used to make a parapatellar arthrotomy going 2-3 cm into the quadriceps tendon, over the patella, and alongside the medial aspect of the patellar tendon.  A synovectomy was then performed with the #10 blade and forceps. I then elevated the deep MCL off the medial  tibial metaphysis subperiosteally around to the semimembranosus attachment.    I everted the patella and used calipers to measure patellar thickness.  I used the reamer to ream down to appropriate thickness to recreate the native thickness.  I then removed excess bone with the rongeur and sagittal saw.  I used the appropriately sized template and drilled the three lug holes.  I then put the trial in place and measured the thickness with the calipers to ensure recreation of the native thickness.  The trial was then removed and the patella subluxed and the knee brought into flexion.  A homan retractor was place to retract and protect the patella and lateral structures.  A Z-retractor was place medially to protect the medial structures.  The extra-medullary alignment system was used to make cut the tibial articular surface perpendicular to the anamotic axis of the tibia and in 3 degrees of posterior slope.  The cut surface and alignment jig was removed.  I then used the intramedullary alignment guide to make a 4 valgus cut on the distal femur.  I then marked out the epicondylar axis on the distal femur.  The posterior condylar axis measured 5 degrees.  I then used the anterior referencing sizer and measured the femur to be a size 7.  The 4-In-1 cutting block was screwed into place in external rotation matching the posterior condylar angle, making our cuts perpendicular to the epicondylar axis.  Anterior, posterior and chamfer cuts were made with the sagittal saw.  The cutting block and cut pieces  were removed.  A lamina spreader was placed in 90 degrees of flexion.  The ACL, PCL, menisci, and posterior condylar osteophytes were removed.  A 11 mm spacer blocked was found to offer good flexion and extension gap balance after minimal in degree releasing.   The scoop retractor was then placed and the femoral finishing block was pinned in place.  The small sagittal saw was used as well as the lug drill to finish the  femur.  The block and cut surfaces were removed and the medullary canal hole filled with autograft bone from the cut pieces.  The tibia was delivered forward in deep flexion and external rotation.  A size D tray was selected and pinned into place centered on the medial 1/3 of the tibial tubercle.  The reamer and keel was used to prepare the tibia through the tray.    I then trialed with the size 7 femur, size D tibia, a 11 mm insert and the 32 patella.  I had excellent flexion/extension gap balance, excellent patella tracking.  Flexion was full and beyond 120 degrees; extension was zero.  These components were chosen and the staff opened them to me on the back table while the knee was lavaged copiously and the cement mixed.  The soft tissue was infiltrated with 60cc of exparel 1.3% through a 21 gauge needle.  I cemented in the components and removed all excess cement.  The polyethylene tibial component was snapped into place and the knee placed in extension while cement was hardening.  The capsule was infilltrated with 30cc of .25% Marcaine with epinephrine.  A hemovac was place in the joint exiting superolaterally.  A pain pump was place superomedially superficial to the arthrotomy.  Once the cement was hard, the tourniquet was let down.  Hemostasis was obtained.  The arthrotomy was closed with figure-8 #1 vicryl sutures.  The deep soft tissues were closed with #0 vicryls and the subcuticular layer closed with a running #2-0 vicryl.  The skin was reapproximated and closed with skin staples.  The wound was dressed with xeroform, 4 x4's, 2 ABD sponges, a single layer of webril and a TED stocking.   The patient was then awakened, extubated, and taken to the recovery room in stable condition.  BLOOD LOSS:  300cc DRAINS: 1 hemovac, 1 pain catheter COMPLICATIONS:  None.  PLAN OF CARE: Admit to inpatient   PATIENT DISPOSITION:  PACU - hemodynamically stable.   Delay start of Pharmacological VTE agent  (>24hrs) due to surgical blood loss or risk of bleeding:   Please fax a copy of this op note to my office at [phone] (please only include page 1 and 2 of the Case Information op note)

## 2017-04-22 NOTE — Evaluation (Addendum)
Physical Therapy Evaluation Patient Details Name: [name] MRN: [id number] DOB: Sep 19, 1923 Today's Date: 04/22/2017   History of Present Illness  Pt is a 83 yo female with c/o R knee pain secondary to end stage knee OA, s/p R TKA 04/22/17. PMH HTN, OA, and GERD.  Clinical Impression  Pt is s/p TKA resulting in the deficits listed below (see PT Problem List). Pt is currently, supervision for bed mobility, and minA for transfers and ambulation of 6 feet with RW. Pt will benefit from skilled PT to increase their independence and safety with mobility to allow discharge to the venue listed below.      Follow Up Recommendations Home health PT;Supervision/Assistance - 24 hour    Equipment Recommendations  Rolling walker with 5" wheels    Recommendations for Other Services OT consult     Precautions / Restrictions Precautions Precautions: Knee Restrictions Weight Bearing Restrictions: Yes RUE Weight Bearing: Weight bearing as tolerated      Mobility  Bed Mobility Overal bed mobility: Needs Assistance Bed Mobility: Supine to Sit     Supine to sit: Supervision     General bed mobility comments: supervision for safety  Transfers Overall transfer level: Needs assistance Equipment used: Rolling walker (2 wheeled) Transfers: Sit to/from Stand Sit to Stand: Min assist         General transfer comment: minA for power up and steading, vc for reaching up to walker with powerup  Ambulation/Gait Ambulation/Gait assistance: Min assist Ambulation Distance (Feet): 6 Feet Assistive device: Rolling walker (2 wheeled) Gait Pattern/deviations: Step-to pattern;Decreased step length - left;Decreased stance time - right;Shuffle;Antalgic;Trunk flexed Gait velocity: slowed Gait velocity interpretation: Below normal speed for age/gender General Gait Details: minA for steading, vc for upright posture and staying within RW         Balance Overall balance assessment: Needs  assistance Sitting-balance support: No upper extremity supported;Feet supported Sitting balance-Leahy Scale: Good     Standing balance support: Single extremity supported;During functional activity Standing balance-Leahy Scale: Fair Standing balance comment: single UE support for pericare                             Pertinent Vitals/Pain Pain Assessment: 0-10 Pain Score: 5  Pain Location: R knee Pain Descriptors / Indicators: Aching;Constant;Guarding;Grimacing Pain Intervention(s): Monitored during session  VSS    Home Living Family/patient expects to be discharged to:: Private residence Living Arrangements: Spouse/significant other Available Help at Discharge: Family;Available 24 hours/day Type of Home: House Home Access: Stairs to enter Entrance Stairs-Rails: Right Entrance Stairs-Number of Steps: 2   Home Equipment: [place] - 4 wheels;Walker - 2 wheels;Bedside commode;Shower seat - built in      Prior Function Level of Independence: Independent with assistive device(s)         Comments: community ambulator no longer drives     Hand Dominance   Dominant Hand: Right    Extremity/Trunk Assessment   Upper Extremity Assessment Upper Extremity Assessment: Overall WFL for tasks assessed    Lower Extremity Assessment Lower Extremity Assessment: RLE deficits/detail RLE Deficits / Details: R TKA, decreased ROM and strength secondary to surgical intervention RLE: Unable to fully assess due to pain RLE Sensation: decreased light touch    Cervical / Trunk Assessment Cervical / Trunk Assessment: Kyphotic  Communication   Communication: No difficulties  Cognition Arousal/Alertness: Awake/alert Behavior During Therapy: WFL for tasks assessed/performed Overall Cognitive Status: Within Functional Limits for tasks assessed  General Comments General comments (skin integrity, edema, etc.): Daughter  present throughout session. Pt with incontinence upon standing requiring removal of TED hose, ace wrap and gown.         Assessment/Plan    PT Assessment Patient needs continued PT services  PT Problem List Decreased strength;Decreased range of motion;Decreased activity tolerance;Decreased balance;Decreased mobility;Decreased safety awareness;Impaired sensation;Pain       PT Treatment Interventions DME instruction;Gait training;Stair training;Functional mobility training;Therapeutic activities;Therapeutic exercise;Balance training;Patient/family education    PT Goals (Current goals can be found in the Care Plan section)  Acute Rehab PT Goals Patient Stated Goal: go home PT Goal Formulation: With patient Time For Goal Achievement: 04/29/17 Potential to Achieve Goals: Good    Frequency 7X/week    AM-PAC PT "6 Clicks" Daily Activity  Outcome Measure Difficulty turning over in bed (including adjusting bedclothes, sheets and blankets)?: A Little Difficulty moving from lying on back to sitting on the side of the bed? : A Little Difficulty sitting down on and standing up from a chair with arms (e.g., wheelchair, bedside commode, etc,.)?: A Little Help needed moving to and from a bed to chair (including a wheelchair)?: None Help needed walking in hospital room?: A Little Help needed climbing 3-5 steps with a railing? : A Lot 6 Click Score: 18    End of Session Equipment Utilized During Treatment: Gait belt Activity Tolerance: Patient tolerated treatment well Patient left: in chair;with call bell/phone within reach;with nursing/sitter in room   PT Visit Diagnosis: Unsteadiness on feet (R26.81);Other abnormalities of gait and mobility (R26.89);Muscle weakness (generalized) (M62.81);Pain;Difficulty in walking, not elsewhere classified (R26.2) Pain - Right/Left: Right Pain - part of body: Knee    Time: 1610-9604 PT Time Calculation (min) (ACUTE ONLY): 36 min   Charges:   PT  Evaluation $PT Eval Low Complexity: 1 Procedure PT Treatments $Therapeutic Activity: 8-22 mins   PT G Codes:   PT G-Codes **NOT FOR INPATIENT CLASS** Functional Assessment Tool Used: AM-PAC 6 Clicks Basic Mobility Functional Limitation: Mobility: Walking and moving around Mobility: Walking and Moving Around Current Status (V4098): At least 40 percent but less than 60 percent impaired, limited or restricted Mobility: Walking and Moving Around Goal Status [phone]): At least 1 percent but less than 20 percent impaired, limited or restricted    Benjamine Mola B. Migdalia Dk PT, DPT Acute Rehabilitation  [phone] Pager [phone]    [place] 04/22/2017, 5:42 PM

## 2017-04-23 ENCOUNTER — Encounter (HOSPITAL_COMMUNITY): Payer: Self-pay | Admitting: General Practice

## 2017-04-23 DIAGNOSIS — I10 Essential (primary) hypertension: Secondary | ICD-10-CM | POA: Diagnosis not present

## 2017-04-23 DIAGNOSIS — E785 Hyperlipidemia, unspecified: Secondary | ICD-10-CM | POA: Diagnosis not present

## 2017-04-23 DIAGNOSIS — K219 Gastro-esophageal reflux disease without esophagitis: Secondary | ICD-10-CM | POA: Diagnosis not present

## 2017-04-23 DIAGNOSIS — M1711 Unilateral primary osteoarthritis, right knee: Secondary | ICD-10-CM | POA: Diagnosis not present

## 2017-04-23 DIAGNOSIS — Z7983 Long term (current) use of bisphosphonates: Secondary | ICD-10-CM | POA: Diagnosis not present

## 2017-04-23 DIAGNOSIS — Z79899 Other long term (current) drug therapy: Secondary | ICD-10-CM | POA: Diagnosis not present

## 2017-04-23 LAB — BASIC METABOLIC PANEL
ANION GAP: 6 (ref 5–15)
BUN: 20 mg/dL (ref 6–20)
CALCIUM: 9.1 mg/dL (ref 8.9–10.3)
CO2: 28 mmol/L (ref 22–32)
Chloride: 105 mmol/L (ref 101–111)
Creatinine, Ser: 1.03 mg/dL — ABNORMAL HIGH (ref 0.44–1.00)
GFR calc Af Amer: 53 mL/min — ABNORMAL LOW (ref >60)
GFR, EST NON AFRICAN AMERICAN: 46 mL/min — AB (ref >60)
GLUCOSE: 110 mg/dL — AB (ref 65–99)
Potassium: 4.7 mmol/L (ref 3.5–5.1)
Sodium: 139 mmol/L (ref 135–145)

## 2017-04-23 LAB — CBC
HEMATOCRIT: 33.9 % — AB (ref 36.0–46.0)
Hemoglobin: 10.9 g/dL — ABNORMAL LOW (ref 12.0–15.0)
MCH: 32.2 pg (ref 26.0–34.0)
MCHC: 32.2 g/dL (ref 30.0–36.0)
MCV: 100 fL (ref 78.0–100.0)
PLATELETS: 261 10*3/uL (ref 150–400)
RBC: 3.39 MIL/uL — ABNORMAL LOW (ref 3.87–5.11)
RDW: 14.1 % (ref 11.5–15.5)
WBC: 10.7 10*3/uL — ABNORMAL HIGH (ref 4.0–10.5)

## 2017-04-23 MED ORDER — ASPIRIN 325 MG PO TBEC: 325.0000 mg | DELAYED_RELEASE_TABLET | Freq: Two times a day (BID) | ORAL | 0 refills | Status: DC

## 2017-04-23 MED ORDER — TRAMADOL HCL 50 MG PO TABS: 50.0000 mg | ORAL_TABLET | Freq: Four times a day (QID) | ORAL | 0 refills | Status: DC

## 2017-04-23 MED ORDER — METHOCARBAMOL 500 MG PO TABS: 500.0000 mg | ORAL_TABLET | Freq: Four times a day (QID) | ORAL | 0 refills | Status: DC | PRN

## 2017-04-23 MED ORDER — HYDROCODONE-ACETAMINOPHEN 5-325 MG PO TABS: 0.5000 | ORAL_TABLET | Freq: Four times a day (QID) | ORAL | 0 refills | Status: DC | PRN

## 2017-04-23 NOTE — Care Management Note (Signed)
Case Management Note  Patient Details  Name: ADDY MCMANNIS MRN: [id number] Date of Birth: 1923/08/26  Subjective/Objective:   37 yr of age [place] female, s/p right total knee arthroplasty.                Action/Plan: Case manager spoke with patient and her daughter concerning discharge plan and DME needs. Patient was preoperatively setup with [facility], no changes. DME has been delivered to her home. Patient has assistance and support from her husband and her children.  No further CM needs.    Expected Discharge Date:  04/23/17               Expected Discharge Plan:  [place]  In-House Referral:  NA  Discharge planning Services  CM Consult  Post Acute Care Choice:  Durable Medical Equipment, Home Health Choice offered to:  Patient, Adult Children  DME Arranged:  3-N-1, Walker rolling, CPM DME Agency:  [employer]  HH Arranged:  PT [place]:  [facility] (formerly [employer])  Status of Service:  Completed, signed off  If discussed at [employer], dates discussed:    Additional Comments:  Ninfa Meeker, RN 04/23/2017, 3:25 PM

## 2017-04-23 NOTE — Progress Notes (Signed)
Physical Therapy Treatment Patient Details Name: [name] MRN: [id number] DOB: 1923-08-29 Today's Date: 04/23/2017    History of Present Illness Pt is a 83 yo female with c/o R knee pain secondary to end stage knee OA, s/p R TKA 04/22/17. PMH HTN, OA, and GERD.    PT Comments    Pt improved ambulation distance today and is progressing well towards goals. Need to practice stairs in PM session prior to d/c home. Will continue to follow acutely.   Follow Up Recommendations  Home health PT;Supervision/Assistance - 24 hour     Equipment Recommendations  Rolling walker with 5" wheels    Recommendations for Other Services OT consult     Precautions / Restrictions Precautions Precautions: Knee Precaution Booklet Issued: Yes (comment) Precaution Comments: Reviewed supine HEP Restrictions Weight Bearing Restrictions: Yes RUE Weight Bearing: Weight bearing as tolerated    Mobility  Bed Mobility Overal bed mobility: Needs Assistance Bed Mobility: Supine to Sit     Supine to sit: Supervision     General bed mobility comments: supervision for safety  Transfers Overall transfer level: Needs assistance Equipment used: Rolling walker (2 wheeled) Transfers: Sit to/from Stand Sit to Stand: Min guard         General transfer comment: No physical assist needed. Min guard as pt reported dizziness with standing in previous session.  Ambulation/Gait Ambulation/Gait assistance: Min assist;Min guard Ambulation Distance (Feet): 50 Feet (x2) Assistive device: Rolling walker (2 wheeled) Gait Pattern/deviations: Step-to pattern;Decreased step length - left;Decreased stance time - right;Shuffle;Antalgic;Trunk flexed Gait velocity: slowed   General Gait Details: Initially min A to steady and manage walker. Progressed to min guard. Cues for posture and proximity to walker.   Stairs            Wheelchair Mobility    Modified Rankin (Stroke Patients Only)       Balance  Overall balance assessment: Needs assistance Sitting-balance support: No upper extremity supported;Feet supported Sitting balance-Leahy Scale: Good     Standing balance support: Single extremity supported;During functional activity Standing balance-Leahy Scale: Fair Standing balance comment: single UE support for pericare                            Cognition Arousal/Alertness: Awake/alert Behavior During Therapy: WFL for tasks assessed/performed Overall Cognitive Status: Within Functional Limits for tasks assessed                                        Exercises Total Joint Exercises Ankle Circles/Pumps: AROM;Both;10 reps;Supine Quad Sets: AROM;Right;10 reps;Supine Towel Squeeze: AROM;Right;10 reps;Supine Short Arc Quad: AROM;Right;10 reps;Supine Heel Slides: AROM;Right;10 reps;Supine Hip ABduction/ADduction: AROM;Right;10 reps;Supine Straight Leg Raises: AROM;Right;10 reps;Supine    General Comments General comments (skin integrity, edema, etc.): Daughter present      Pertinent Vitals/Pain Pain Assessment: 0-10 Pain Score: 5  Pain Location: R knee Pain Descriptors / Indicators: Aching;Constant;Guarding;Grimacing Pain Intervention(s): Monitored during session;Limited activity within patient's tolerance;Ice applied    Home Living                      Prior Function            PT Goals (current goals can now be found in the care plan section) Acute Rehab PT Goals Patient Stated Goal: go home PT Goal Formulation: With patient Time For Goal Achievement: 04/29/17  Potential to Achieve Goals: Good Progress towards PT goals: Progressing toward goals    Frequency    7X/week      PT Plan Current plan remains appropriate    Co-evaluation              AM-PAC PT "6 Clicks" Daily Activity  Outcome Measure  Difficulty turning over in bed (including adjusting bedclothes, sheets and blankets)?: A Little Difficulty moving  from lying on back to sitting on the side of the bed? : A Little Difficulty sitting down on and standing up from a chair with arms (e.g., wheelchair, bedside commode, etc,.)?: A Little Help needed moving to and from a bed to chair (including a wheelchair)?: A Little Help needed walking in hospital room?: A Little Help needed climbing 3-5 steps with a railing? : A Little 6 Click Score: 18    End of Session Equipment Utilized During Treatment: Gait belt Activity Tolerance: Patient tolerated treatment well Patient left: in chair;with call bell/phone within reach;with family/visitor present Nurse Communication: Mobility status PT Visit Diagnosis: Unsteadiness on feet (R26.81);Other abnormalities of gait and mobility (R26.89);Muscle weakness (generalized) (M62.81);Pain;Difficulty in walking, not elsewhere classified (R26.2) Pain - Right/Left: Right Pain - part of body: Knee     Time: 8022-3361 PT Time Calculation (min) (ACUTE ONLY): 52 min  Charges:  $Gait Training: 8-22 mins $Therapeutic Exercise: 23-37 mins                    G Codes:       [name], [place] Pager [phone] Acute Rehab   Allena Katz 04/23/2017, 10:29 AM

## 2017-04-23 NOTE — Progress Notes (Signed)
Physical Therapy Treatment Patient Details Name: [name] MRN: [id number] DOB: July 02, 1923 Today's Date: 04/23/2017    History of Present Illness Pt is a 83 yo female with c/o R knee pain secondary to end stage knee OA, s/p R TKA 04/22/17. PMH HTN, OA, and GERD.    PT Comments    Today's skilled session focused on completing HEP, stair negotiation, and gait. Pt is progressing well towards mobility goals and expected to d/c home today.    Follow Up Recommendations  Home health PT;Supervision/Assistance - 24 hour     Equipment Recommendations  Rolling walker with 5" wheels    Recommendations for Other Services OT consult     Precautions / Restrictions Precautions Precautions: Knee Precaution Booklet Issued: Yes (comment) Precaution Comments: reviewed knee precautions of keeping knee extension Restrictions Weight Bearing Restrictions: No RUE Weight Bearing: Weight bearing as tolerated    Mobility  Bed Mobility Overal bed mobility: Modified Independent Bed Mobility: Supine to Sit     Supine to sit: Modified independent (Device/Increase time)        Transfers Overall transfer level: Needs assistance Equipment used: Rolling walker (2 wheeled) Transfers: Sit to/from Stand Sit to Stand: Min guard         General transfer comment: No physical assist needed. Min guard for safety. Cueing for hand placement  Ambulation/Gait Ambulation/Gait assistance: Min guard;Min assist Ambulation Distance (Feet): 100 Feet Assistive device: Rolling walker (2 wheeled) Gait Pattern/deviations: Step-to pattern;Decreased step length - left;Decreased stance time - right;Shuffle;Antalgic;Trunk flexed Gait velocity: slowed Gait velocity interpretation: Below normal speed for age/gender General Gait Details: Min A for walker management. Cues for posture and proximity to walker.   Stairs Stairs: Yes   Stair Management: One rail Right;Step to pattern;Sideways Number of Stairs:  3 General stair comments: Min cueing to correctly negotiate stairs.   Wheelchair Mobility    Modified Rankin (Stroke Patients Only)       Balance Overall balance assessment: Needs assistance Sitting-balance support: No upper extremity supported;Feet supported Sitting balance-Leahy Scale: Good     Standing balance support: Single extremity supported;During functional activity Standing balance-Leahy Scale: Fair Standing balance comment: single UE support for pericare                            Cognition Arousal/Alertness: Awake/alert Behavior During Therapy: WFL for tasks assessed/performed Overall Cognitive Status: Within Functional Limits for tasks assessed                                        Exercises Total Joint Exercises Heel Slides: AROM;Right;10 reps;Seated (towel under foot) Long Arc Quad: AROM;Right;10 reps;Seated Knee Flexion: AROM;Right;10 reps;Seated (10 second holds)    General Comments General comments (skin integrity, edema, etc.): Daughter present through out session      Pertinent Vitals/Pain Pain Assessment: 0-10 Pain Score: 5  Pain Location: R knee Pain Descriptors / Indicators: Aching;Constant;Guarding;Grimacing Pain Intervention(s): Monitored during session;Limited activity within patient's tolerance;Repositioned    Home Living Family/patient expects to be discharged to:: Private residence Living Arrangements: Spouse/significant other                  Prior Function            PT Goals (current goals can now be found in the care plan section) Acute Rehab PT Goals Patient Stated Goal: go home PT  Goal Formulation: With patient Time For Goal Achievement: 04/29/17 Potential to Achieve Goals: Good Progress towards PT goals: Progressing toward goals    Frequency    7X/week      PT Plan Current plan remains appropriate    Co-evaluation              AM-PAC PT "6 Clicks" Daily Activity   Outcome Measure  Difficulty turning over in bed (including adjusting bedclothes, sheets and blankets)?: A Little Difficulty moving from lying on back to sitting on the side of the bed? : A Little Difficulty sitting down on and standing up from a chair with arms (e.g., wheelchair, bedside commode, etc,.)?: A Little Help needed moving to and from a bed to chair (including a wheelchair)?: A Little Help needed walking in hospital room?: A Little Help needed climbing 3-5 steps with a railing? : A Little 6 Click Score: 18    End of Session Equipment Utilized During Treatment: Gait belt Activity Tolerance: Patient tolerated treatment well Patient left: in chair;with call bell/phone within reach;with family/visitor present Nurse Communication: Mobility status PT Visit Diagnosis: Unsteadiness on feet (R26.81);Other abnormalities of gait and mobility (R26.89);Muscle weakness (generalized) (M62.81);Pain;Difficulty in walking, not elsewhere classified (R26.2) Pain - Right/Left: Right Pain - part of body: Knee     Time: 0263-7858 PT Time Calculation (min) (ACUTE ONLY): 34 min  Charges:  $Gait Training: 8-22 mins $Therapeutic Exercise: 8-22 mins                    G Codes:      Benjiman Core, [place] Pager [phone] Acute Rehab    Allena Katz 04/23/2017, 4:07 PM

## 2017-04-23 NOTE — Progress Notes (Signed)
SPORTS MEDICINE AND JOINT REPLACEMENT  Lara Mulch, MD    Carlyon Shadow, PA-C [place], [place], [place]  [address]                             [phone]   PROGRESS NOTE  Subjective:  negative for Chest Pain  negative for Shortness of Breath  negative for Nausea/Vomiting   negative for Calf Pain  negative for Bowel Movement   Tolerating Diet: yes         Patient reports pain as 3 on 0-10 scale.    Objective: Vital signs in last 24 hours:   Patient Vitals for the past 24 hrs:  BP Temp Temp src Pulse Resp SpO2  04/23/17 0248 (!) 130/54 98 F (36.7 C) Oral 72 18 97 %  04/22/17 2055 (!) 132/57 97.6 F (36.4 C) Oral 75 18 96 %  04/22/17 1749 (!) 116/57 98.5 F (36.9 C) Oral 83 - 93 %  04/22/17 1600 - 97.7 F (36.5 C) - - - -  04/22/17 1522 (!) 137/54 97.7 F (36.5 C) Oral 63 - 98 %  04/22/17 1500 140/65 (!) 97.5 F (36.4 C) - 69 (!) 23 98 %  04/22/17 1445 (!) 143/71 - - 67 16 100 %  04/22/17 1430 (!) 148/64 - - 62 (!) 26 97 %  04/22/17 1415 (!) 143/61 - - 63 17 97 %  04/22/17 1400 (!) 149/61 - - 63 15 97 %  04/22/17 1345 128/60 - - (!) 58 17 96 %  04/22/17 1330 (!) 127/57 - - (!) 56 13 96 %  04/22/17 1315 (!) 127/52 - - (!) 57 15 95 %  04/22/17 1300 (!) 126/55 - - (!) 58 13 96 %  04/22/17 1245 (!) 123/58 - - 60 17 95 %  04/22/17 1230 119/60 - - (!) 57 17 100 %  04/22/17 1215 (!) 116/54 - - (!) 59 16 100 %  04/22/17 1200 (!) 115/54 - - (!) 59 11 99 %  04/22/17 1159 (!) 115/54 97.8 F (36.6 C) - 60 12 99 %    @flow {1959:LAST@   Intake/Output from previous day:   07/16 0701 - 07/17 0700 In: 1643.3 [P.O.:200; I.V.:1283.3] Out: 1120 [Urine:1100]   Intake/Output this shift:   No intake/output data recorded.   Intake/Output      07/16 0701 - 07/17 0700 07/17 0701 - 07/18 0700   P.O. 200    I.V. (mL/kg) 1283.3 (24)    IV Piggyback 160    Total Intake(mL/kg) 1643.3 (30.7)    Urine (mL/kg/hr) 1100 (0.9)    Blood 20    Total Output 1120     Net  +523.3             LABORATORY DATA:  Recent Labs  04/23/17 0535  WBC 10.7*  HGB 10.9*  HCT 33.9*  PLT 261    Recent Labs  04/23/17 0535  NA 139  K 4.7  CL 105  CO2 28  BUN 20  CREATININE 1.03*  GLUCOSE 110*  CALCIUM 9.1   No results found for: INR, PROTIME  Examination:  General appearance: alert, cooperative and no distress Extremities: extremities normal, atraumatic, no cyanosis or edema  Wound Exam: clean, dry, intact   Drainage:  None: wound tissue dry  Motor Exam: Quadriceps and Hamstrings Intact  Sensory Exam: Superficial Peroneal, Deep Peroneal and Tibial normal   Assessment:    1 Day Post-Op  Procedure(s) (LRB): TOTAL KNEE ARTHROPLASTY (Right)  ADDITIONAL DIAGNOSIS:  Active Problems:   S/P total knee replacement     Plan: Physical Therapy as ordered Weight Bearing as Tolerated (WBAT)  DVT Prophylaxis:  Aspirin  DISCHARGE PLAN: Home  DISCHARGE NEEDS: HHPT   Patient doing well, ok for D/C once cleared by PT and is able to void on their own.          Donia Ast 04/23/2017, 7:37 AM

## 2017-04-23 NOTE — Care Management Obs Status (Signed)
[facility] NOTIFICATION   Patient Details  Name: [name] MRN: [id number] Date of Birth: Aug 01, 1923   Medicare Observation Status Notification Given:  Yes    Ninfa Meeker, RN 04/23/2017, 3:24 PM

## 2017-04-23 NOTE — Progress Notes (Signed)
Orthopedic Tech Progress Note Patient Details:  [name] Feb 17, 1923 [id number]  Patient ID: [name], female   DOB: 09/17/1923, 83 y.o.   MRN: [id number]   [name] 04/23/2017, 1:39 PM Placed pt's rle on cpm @0 -90 degrees @1330 ; RN notified

## 2017-04-23 NOTE — Discharge Summary (Signed)
SPORTS MEDICINE & JOINT REPLACEMENT   Stacy Mulch, MD   Stacy Shadow, PA-C [place], [place], [place]  [address]                             [phone]  PATIENT ID: [name]        MRN:  [id number]          DOB/AGE: 11-07-1922 / 83 y.o.    DISCHARGE SUMMARY  ADMISSION DATE:    04/22/2017 DISCHARGE DATE:   04/23/2017   ADMISSION DIAGNOSIS: primary osteoarthritis right knee    DISCHARGE DIAGNOSIS:  primary osteoarthritis right knee    ADDITIONAL DIAGNOSIS: Active Problems:   S/P total knee replacement  Past Medical History:  Diagnosis Date  . Arthritis   . GERD (gastroesophageal reflux disease)   . Headache     migraines 5-6 a month, last 30 mins.  . Hypertension     PROCEDURE: Procedure(s): TOTAL KNEE ARTHROPLASTY on 04/22/2017  CONSULTS:    HISTORY:  See H&P in chart  HOSPITAL COURSE:  [name] is a 83 y.o. admitted on 04/22/2017 and found to have a diagnosis of primary osteoarthritis right knee.  After appropriate laboratory studies were obtained  they were taken to the operating room on 04/22/2017 and underwent Procedure(s): TOTAL KNEE ARTHROPLASTY.   They were given perioperative antibiotics:  Anti-infectives    Start     Dose/Rate Route Frequency Ordered Stop   04/22/17 1630  ceFAZolin (ANCEF) IVPB 1 g/50 mL premix     1 g 100 mL/hr over 30 Minutes Intravenous Every 6 hours 04/22/17 1521 04/22/17 2314   04/22/17 0900  ceFAZolin (ANCEF) IVPB 2g/100 mL premix     2 g 200 mL/hr over 30 Minutes Intravenous To ShortStay Surgical 04/19/17 1245 04/22/17 0954    .  Patient given tranexamic acid IV or topical and exparel intra-operatively.  Tolerated the procedure well.    POD# 1: Vital signs were stable.  Patient denied Chest pain, shortness of breath, or calf pain.  Patient was started on Lovenox 30 mg subcutaneously twice daily at 8am.  Consults to PT, OT, and care management were made.  The patient was weight bearing as tolerated.  CPM  was placed on the operative leg 0-90 degrees for 6-8 hours a day. When out of the CPM, patient was placed in the foam block to achieve full extension. Incentive spirometry was taught.  Dressing was changed.       POD #2, Continued  PT for ambulation and exercise program.  IV saline locked.  O2 discontinued.    The remainder of the hospital course was dedicated to ambulation and strengthening.   The patient was discharged on 1 Day Post-Op in  Good condition.  Blood products given:none  DIAGNOSTIC STUDIES: Recent vital signs: Patient Vitals for the past 24 hrs:  BP Temp Temp src Pulse Resp SpO2  04/23/17 0248 (!) 130/54 98 F (36.7 C) Oral 72 18 97 %  04/22/17 2055 (!) 132/57 97.6 F (36.4 C) Oral 75 18 96 %  04/22/17 1749 (!) 116/57 98.5 F (36.9 C) Oral 83 - 93 %  04/22/17 1600 - 97.7 F (36.5 C) - - - -  04/22/17 1522 (!) 137/54 97.7 F (36.5 C) Oral 63 - 98 %  04/22/17 1500 140/65 (!) 97.5 F (36.4 C) - 69 (!) 23 98 %  04/22/17 1445 (!) 143/71 - - 67 16 100 %  04/22/17 1430 (!) 148/64 - - 62 (!) 26 97 %  04/22/17 1415 (!) 143/61 - - 63 17 97 %  04/22/17 1400 (!) 149/61 - - 63 15 97 %  04/22/17 1345 128/60 - - (!) 58 17 96 %  04/22/17 1330 (!) 127/57 - - (!) 56 13 96 %  04/22/17 1315 (!) 127/52 - - (!) 57 15 95 %  04/22/17 1300 (!) 126/55 - - (!) 58 13 96 %  04/22/17 1245 (!) 123/58 - - 60 17 95 %       Recent laboratory studies:  Recent Labs  04/23/17 0535  WBC 10.7*  HGB 10.9*  HCT 33.9*  PLT 261    Recent Labs  04/23/17 0535  NA 139  K 4.7  CL 105  CO2 28  BUN 20  CREATININE 1.03*  GLUCOSE 110*  CALCIUM 9.1   No results found for: INR, PROTIME   Recent Radiographic Studies :  No results found.  DISCHARGE INSTRUCTIONS:   DISCHARGE MEDICATIONS:   Allergies as of 04/23/2017      Reactions   Sulfamethoxazole Diarrhea, Other (See Comments)   GI UPSET      Medication List    STOP taking these medications   IBUPROFEN PM 200-38 MG Tabs Generic  drug:  Ibuprofen-Diphenhydramine Cit   meloxicam 7.5 MG tablet Commonly known as:  MOBIC     TAKE these medications   acetaminophen 650 MG CR tablet Commonly known as:  TYLENOL Take 650 mg by mouth every 8 (eight) hours as needed for pain.   aspirin 325 MG EC tablet Take 1 tablet (325 mg total) by mouth 2 (two) times daily.   atorvastatin 20 MG tablet Commonly known as:  LIPITOR Take 20 mg by mouth at bedtime.   CALCIUM CITRATE PO Take 1 tablet by mouth daily.   COSAMIN DS PO Take 1 tablet by mouth 2 (two) times daily.   FISH OIL PO Take 1 capsule by mouth daily.   HYDROcodone-acetaminophen 5-325 MG tablet Commonly known as:  NORCO/VICODIN Take 0.5-1 tablets by mouth every 6 (six) hours as needed for severe pain.   ICAPS MV PO Take 1 capsule by mouth 2 (two) times daily.   IRON 27 PO Take 1 tablet by mouth 2 (two) times daily.   lisinopril 20 MG tablet Commonly known as:  PRINIVIL,ZESTRIL Take 20 mg by mouth daily.   Magnesium 250 MG Tabs Take 250 mg by mouth at bedtime.   methocarbamol 500 MG tablet Commonly known as:  ROBAXIN Take 1 tablet (500 mg total) by mouth every 6 (six) hours as needed for muscle spasms.   omeprazole 40 MG capsule Commonly known as:  PRILOSEC Take 40 mg by mouth daily before breakfast.   psyllium 58.6 % powder Commonly known as:  METAMUCIL Take 1 packet by mouth daily after lunch.   RECLAST 5 MG/100ML Soln injection Generic drug:  zoledronic acid Inject 5 mg into the vein See admin instructions. Once a year.   SYSTANE 0.4-0.3 % Soln Generic drug:  Polyethyl Glycol-Propyl Glycol Place 1 drop into both eyes 2 (two) times daily as needed (for tired/dry eyes.).   traMADol 50 MG tablet Commonly known as:  ULTRAM Take 1-2 tablets (50-100 mg total) by mouth every 6 (six) hours.            Durable Medical Equipment        Start     Ordered   04/22/17 1522  DME Walker rolling  Once    Question:  Patient needs a walker to  treat with the following condition  Answer:  S/P total knee replacement   04/22/17 1521   04/22/17 1522  DME 3 n 1  Once     04/22/17 1521   04/22/17 1522  DME Bedside commode  Once    Question:  Patient needs a bedside commode to treat with the following condition  Answer:  S/P total knee replacement   04/22/17 1521      FOLLOW UP VISIT:    DISPOSITION: HOME VS. SNF  CONDITION:  Good   Donia Ast 04/23/2017, 12:35 PM

## 2017-04-23 NOTE — Evaluation (Signed)
Occupational Therapy Evaluation Patient Details Name: [name] MRN: [id number] DOB: May 30, 1923 Today's Date: 04/23/2017    History of Present Illness Pt is a 83 yo female with c/o R knee pain secondary to end stage knee OA, s/p R TKA 04/22/17. PMH HTN, OA, and GERD.   Clinical Impression   Patient is s/p R TKA surgery resulting in functional limitations due to the deficits listed below (see OT problem list). Pt will require min guard (A) from daughter upon d/c home. Pt pleasant and eager to d/c today.  Patient will benefit from skilled OT acutely to increase independence and safety with ADLS to allow discharge home with family .     Follow Up Recommendations  No OT follow up    Equipment Recommendations  None recommended by OT    Recommendations for Other Services       Precautions / Restrictions Precautions Precautions: Knee Precaution Booklet Issued: Yes (comment) Precaution Comments: reviewed knee precautions of keeping knee extension Restrictions Weight Bearing Restrictions: No RUE Weight Bearing: Weight bearing as tolerated      Mobility Bed Mobility Overal bed mobility: Needs Assistance Bed Mobility: Supine to Sit     Supine to sit: Supervision     General bed mobility comments: in chair on arrival  Transfers Overall transfer level: Needs assistance Equipment used: Rolling walker (2 wheeled) Transfers: Sit to/from Stand Sit to Stand: Min guard         General transfer comment: No physical assist needed. Min guard as pt reported dizziness with standing in previous session.    Balance Overall balance assessment: Needs assistance Sitting-balance support: No upper extremity supported;Feet supported Sitting balance-Leahy Scale: Good     Standing balance support: Single extremity supported;During functional activity Standing balance-Leahy Scale: Fair Standing balance comment: single UE support for pericare                           ADL  either performed or assessed with clinical judgement   ADL Overall ADL's : Needs assistance/impaired Eating/Feeding: Independent   Grooming: Independent   Upper Body Bathing: Set up   Lower Body Bathing: Min guard Lower Body Bathing Details (indicate cue type and reason): pt is able to reach feet         Toilet Transfer: Min Educational psychologist Details (indicate cue type and reason): educated with demo of shower transfer. daughter present   General ADL Comments: pt will have daughter (A). educated on bathign/ dressing/ allowing daughter to (A) with medication management upon d/c and fall risk     Vision Baseline Vision/History: Wears glasses Wears Glasses: At all times       Perception     Praxis      Pertinent Vitals/Pain Pain Assessment: No/denies pain Pain Score: 5  Pain Location: R knee Pain Descriptors / Indicators: Aching;Constant;Guarding;Grimacing Pain Intervention(s): Premedicated before session;Monitored during session;Ice applied     Hand Dominance Right   Extremity/Trunk Assessment Upper Extremity Assessment Upper Extremity Assessment: Overall WFL for tasks assessed   Lower Extremity Assessment Lower Extremity Assessment: Defer to PT evaluation   Cervical / Trunk Assessment Cervical / Trunk Assessment: Kyphotic   Communication Communication Communication: HOH (hearing aides)   Cognition Arousal/Alertness: Awake/alert Behavior During Therapy: WFL for tasks assessed/performed Overall Cognitive Status: Within Functional Limits for tasks assessed  General Comments  educated on shower and dressing site    Exercises Exercises: Total Joint Total Joint Exercises Ankle Circles/Pumps: AROM;Both;10 reps;Supine Quad Sets: AROM;Right;10 reps;Supine Towel Squeeze: AROM;Right;10 reps;Supine Short Arc Quad: AROM;Right;10 reps;Supine Heel Slides: AROM;Right;10 reps;Supine Hip  ABduction/ADduction: AROM;Right;10 reps;Supine Straight Leg Raises: AROM;Right;10 reps;Supine   Shoulder Instructions      Home Living Family/patient expects to be discharged to:: Private residence Living Arrangements: Spouse/significant other Available Help at Discharge: Family;Available 24 hours/day Type of Home: House Home Access: Stairs to enter [employer] of Steps: 2 Entrance Stairs-Rails: Right       Bathroom Shower/Tub: Occupational psychologist: Standard Bathroom Accessibility: No   Home Equipment: Environmental consultant - 4 wheels;Walker - 2 wheels;Bedside commode;Shower seat - built in   Additional Comments: lives with 61 yo spouse and will have daughter (A) upon d/c      Prior Functioning/Environment Level of Independence: Independent with assistive device(s)        Comments: does not drive but does community ambulate        OT Problem List:        OT Treatment/Interventions:      OT Goals(Current goals can be found in the care plan section) Acute Rehab OT Goals Patient Stated Goal: go home OT Goal Formulation: With patient  OT Frequency:     Barriers to D/C:            Co-evaluation              AM-PAC PT "6 Clicks" Daily Activity     Outcome Measure Help from another person eating meals?: None Help from another person taking care of personal grooming?: None Help from another person toileting, which includes using toliet, bedpan, or urinal?: A Little Help from another person bathing (including washing, rinsing, drying)?: A Little Help from another person to put on and taking off regular upper body clothing?: None Help from another person to put on and taking off regular lower body clothing?: A Little 6 Click Score: 21   End of Session Equipment Utilized During Treatment: Gait belt;Rolling walker CPM Right Knee CPM Right Knee: Off Nurse Communication: Mobility status;Precautions  Activity Tolerance: Patient tolerated treatment  well Patient left: in chair;with call bell/phone within reach;with chair alarm set;with family/visitor present  OT Visit Diagnosis: Unsteadiness on feet (R26.81)                Time: 8115-7262 OT Time Calculation (min): 31 min Charges:  OT General Charges $OT Visit: 1 Procedure OT Evaluation $OT Eval Moderate Complexity: 1 Procedure G-Codes: OT G-codes **NOT FOR INPATIENT CLASS** Functional Assessment Tool Used: Clinical judgement Functional Limitation: Self care Self Care Current Status (M3559): At least 1 percent but less than 20 percent impaired, limited or restricted Self Care Discharge Status [phone]): At least 1 percent but less than 20 percent impaired, limited or restricted    Jeri Modena   OTR/L Pager: [phone] Office: [phone] .   Parke Poisson B 04/23/2017, 11:50 AM

## 2017-04-23 NOTE — Anesthesia Postprocedure Evaluation (Signed)
Anesthesia Post Note  Patient: [name]  Procedure(s) Performed: Procedure(s) (LRB): TOTAL KNEE ARTHROPLASTY (Right)     Patient location during evaluation: PACU Anesthesia Type: Regional and Spinal Level of consciousness: oriented and awake and alert Pain management: pain level controlled Vital Signs Assessment: post-procedure vital signs reviewed and stable Respiratory status: spontaneous breathing, respiratory function stable and patient connected to nasal cannula oxygen Cardiovascular status: blood pressure returned to baseline and stable Postop Assessment: no headache and no backache Anesthetic complications: no    Last Vitals:  Vitals:   04/22/17 2055 04/23/17 0248  BP: (!) 132/57 (!) 130/54  Pulse: 75 72  Resp: 18 18  Temp: 36.4 C 36.7 C    Last Pain:  Vitals:   04/23/17 1018  TempSrc:   PainSc: 4                  Tryton Bodi P Taziah Difatta

## 2017-04-24 DIAGNOSIS — Z471 Aftercare following joint replacement surgery: Secondary | ICD-10-CM | POA: Diagnosis not present

## 2017-04-24 DIAGNOSIS — Z96651 Presence of right artificial knee joint: Secondary | ICD-10-CM | POA: Diagnosis not present

## 2017-04-24 DIAGNOSIS — I10 Essential (primary) hypertension: Secondary | ICD-10-CM | POA: Diagnosis not present

## 2017-04-25 DIAGNOSIS — I10 Essential (primary) hypertension: Secondary | ICD-10-CM | POA: Diagnosis not present

## 2017-04-25 DIAGNOSIS — Z471 Aftercare following joint replacement surgery: Secondary | ICD-10-CM | POA: Diagnosis not present

## 2017-04-25 DIAGNOSIS — Z96651 Presence of right artificial knee joint: Secondary | ICD-10-CM | POA: Diagnosis not present

## 2017-04-26 DIAGNOSIS — Z471 Aftercare following joint replacement surgery: Secondary | ICD-10-CM | POA: Diagnosis not present

## 2017-04-26 DIAGNOSIS — I10 Essential (primary) hypertension: Secondary | ICD-10-CM | POA: Diagnosis not present

## 2017-04-26 DIAGNOSIS — Z96651 Presence of right artificial knee joint: Secondary | ICD-10-CM | POA: Diagnosis not present

## 2017-04-30 DIAGNOSIS — Z96651 Presence of right artificial knee joint: Secondary | ICD-10-CM | POA: Diagnosis not present

## 2017-04-30 DIAGNOSIS — I10 Essential (primary) hypertension: Secondary | ICD-10-CM | POA: Diagnosis not present

## 2017-04-30 DIAGNOSIS — Z471 Aftercare following joint replacement surgery: Secondary | ICD-10-CM | POA: Diagnosis not present

## 2017-05-01 DIAGNOSIS — I10 Essential (primary) hypertension: Secondary | ICD-10-CM | POA: Diagnosis not present

## 2017-05-01 DIAGNOSIS — Z471 Aftercare following joint replacement surgery: Secondary | ICD-10-CM | POA: Diagnosis not present

## 2017-05-01 DIAGNOSIS — Z96651 Presence of right artificial knee joint: Secondary | ICD-10-CM | POA: Diagnosis not present

## 2017-05-02 DIAGNOSIS — M25461 Effusion, right knee: Secondary | ICD-10-CM | POA: Diagnosis not present

## 2017-05-02 DIAGNOSIS — Z96651 Presence of right artificial knee joint: Secondary | ICD-10-CM | POA: Diagnosis not present

## 2017-05-02 DIAGNOSIS — Z471 Aftercare following joint replacement surgery: Secondary | ICD-10-CM | POA: Diagnosis not present

## 2017-05-03 DIAGNOSIS — M25561 Pain in right knee: Secondary | ICD-10-CM | POA: Diagnosis not present

## 2017-05-03 DIAGNOSIS — M25661 Stiffness of right knee, not elsewhere classified: Secondary | ICD-10-CM | POA: Diagnosis not present

## 2017-05-07 DIAGNOSIS — M25561 Pain in right knee: Secondary | ICD-10-CM | POA: Diagnosis not present

## 2017-05-07 DIAGNOSIS — M25661 Stiffness of right knee, not elsewhere classified: Secondary | ICD-10-CM | POA: Diagnosis not present

## 2017-05-09 DIAGNOSIS — M25661 Stiffness of right knee, not elsewhere classified: Secondary | ICD-10-CM | POA: Diagnosis not present

## 2017-05-09 DIAGNOSIS — M25561 Pain in right knee: Secondary | ICD-10-CM | POA: Diagnosis not present

## 2017-05-13 DIAGNOSIS — M25561 Pain in right knee: Secondary | ICD-10-CM | POA: Diagnosis not present

## 2017-05-13 DIAGNOSIS — M25661 Stiffness of right knee, not elsewhere classified: Secondary | ICD-10-CM | POA: Diagnosis not present

## 2017-05-16 DIAGNOSIS — M25561 Pain in right knee: Secondary | ICD-10-CM | POA: Diagnosis not present

## 2017-05-16 DIAGNOSIS — M25661 Stiffness of right knee, not elsewhere classified: Secondary | ICD-10-CM | POA: Diagnosis not present

## 2017-05-21 DIAGNOSIS — M25561 Pain in right knee: Secondary | ICD-10-CM | POA: Diagnosis not present

## 2017-05-21 DIAGNOSIS — M25661 Stiffness of right knee, not elsewhere classified: Secondary | ICD-10-CM | POA: Diagnosis not present

## 2017-05-23 DIAGNOSIS — M25561 Pain in right knee: Secondary | ICD-10-CM | POA: Diagnosis not present

## 2017-05-23 DIAGNOSIS — M25661 Stiffness of right knee, not elsewhere classified: Secondary | ICD-10-CM | POA: Diagnosis not present

## 2017-05-28 DIAGNOSIS — M25561 Pain in right knee: Secondary | ICD-10-CM | POA: Diagnosis not present

## 2017-05-28 DIAGNOSIS — M25661 Stiffness of right knee, not elsewhere classified: Secondary | ICD-10-CM | POA: Diagnosis not present

## 2017-06-12 DIAGNOSIS — E782 Mixed hyperlipidemia: Secondary | ICD-10-CM | POA: Diagnosis not present

## 2017-06-12 DIAGNOSIS — I10 Essential (primary) hypertension: Secondary | ICD-10-CM | POA: Diagnosis not present

## 2017-06-12 DIAGNOSIS — R5383 Other fatigue: Secondary | ICD-10-CM | POA: Diagnosis not present

## 2017-06-12 DIAGNOSIS — E559 Vitamin D deficiency, unspecified: Secondary | ICD-10-CM | POA: Diagnosis not present

## 2017-06-12 DIAGNOSIS — Z23 Encounter for immunization: Secondary | ICD-10-CM | POA: Diagnosis not present

## 2017-06-12 DIAGNOSIS — R5381 Other malaise: Secondary | ICD-10-CM | POA: Diagnosis not present

## 2017-06-13 DIAGNOSIS — M5136 Other intervertebral disc degeneration, lumbar region: Secondary | ICD-10-CM | POA: Diagnosis not present

## 2017-06-27 DIAGNOSIS — H353132 Nonexudative age-related macular degeneration, bilateral, intermediate dry stage: Secondary | ICD-10-CM | POA: Diagnosis not present

## 2017-06-27 DIAGNOSIS — H35371 Puckering of macula, right eye: Secondary | ICD-10-CM | POA: Diagnosis not present

## 2017-06-27 DIAGNOSIS — Z961 Presence of intraocular lens: Secondary | ICD-10-CM | POA: Diagnosis not present

## 2017-06-27 DIAGNOSIS — H43811 Vitreous degeneration, right eye: Secondary | ICD-10-CM | POA: Diagnosis not present

## 2017-06-27 DIAGNOSIS — H35363 Drusen (degenerative) of macula, bilateral: Secondary | ICD-10-CM | POA: Diagnosis not present

## 2017-07-16 DIAGNOSIS — M5136 Other intervertebral disc degeneration, lumbar region: Secondary | ICD-10-CM | POA: Diagnosis not present

## 2017-09-06 DIAGNOSIS — J40 Bronchitis, not specified as acute or chronic: Secondary | ICD-10-CM | POA: Diagnosis not present

## 2017-09-06 DIAGNOSIS — J329 Chronic sinusitis, unspecified: Secondary | ICD-10-CM | POA: Diagnosis not present

## 2018-08-06 ENCOUNTER — Other Ambulatory Visit: Payer: Self-pay | Admitting: Physical Medicine and Rehabilitation

## 2019-08-05 ENCOUNTER — Other Ambulatory Visit: Payer: Self-pay

## 2019-08-05 ENCOUNTER — Emergency Department (HOSPITAL_COMMUNITY)
Admission: EM | Admit: 2019-08-05 | Discharge: 2019-08-06 | Discharge status: Against Medical Advice | Payer: Medicare Other | Attending: Emergency Medicine | Admitting: Emergency Medicine

## 2019-08-05 DIAGNOSIS — D649 Anemia, unspecified: Secondary | ICD-10-CM | POA: Diagnosis present

## 2019-08-05 DIAGNOSIS — Z5321 Procedure and treatment not carried out due to patient leaving prior to being seen by health care provider: Secondary | ICD-10-CM | POA: Diagnosis not present

## 2019-08-05 LAB — CBC
HCT: 35.9 % — ABNORMAL LOW (ref 36.0–46.0)
Hemoglobin: 11 g/dL — ABNORMAL LOW (ref 12.0–15.0)
MCH: 31.9 pg (ref 26.0–34.0)
MCHC: 30.6 g/dL (ref 30.0–36.0)
MCV: 104.1 fL — ABNORMAL HIGH (ref 80.0–100.0)
Platelets: 275 10*3/uL (ref 150–400)
RBC: 3.45 MIL/uL — ABNORMAL LOW (ref 3.87–5.11)
RDW: 13.6 % (ref 11.5–15.5)
WBC: 4.9 10*3/uL (ref 4.0–10.5)
nRBC: 0 % (ref 0.0–0.2)

## 2019-08-05 LAB — COMPREHENSIVE METABOLIC PANEL
ALT: 18 U/L (ref 0–44)
AST: 24 U/L (ref 15–41)
Albumin: 3.5 g/dL (ref 3.5–5.0)
Alkaline Phosphatase: 64 U/L (ref 38–126)
Anion gap: 8 (ref 5–15)
BUN: 26 mg/dL — ABNORMAL HIGH (ref 8–23)
CO2: 24 mmol/L (ref 22–32)
Calcium: 9 mg/dL (ref 8.9–10.3)
Chloride: 107 mmol/L (ref 98–111)
Creatinine, Ser: 0.96 mg/dL (ref 0.44–1.00)
GFR calc Af Amer: 59 mL/min — ABNORMAL LOW (ref >60)
GFR calc non Af Amer: 51 mL/min — ABNORMAL LOW (ref >60)
Glucose, Bld: 213 mg/dL — ABNORMAL HIGH (ref 70–99)
Potassium: 4.4 mmol/L (ref 3.5–5.1)
Sodium: 139 mmol/L (ref 135–145)
Total Bilirubin: 0.3 mg/dL (ref 0.3–1.2)
Total Protein: 6.2 g/dL — ABNORMAL LOW (ref 6.5–8.1)

## 2019-08-05 LAB — ABO/RH: ABO/RH(D): A POS

## 2019-08-05 LAB — TYPE AND SCREEN
ABO/RH(D): A POS
Antibody Screen: NEGATIVE

## 2019-08-05 NOTE — ED Triage Notes (Signed)
Pt sent from PCP office for low Hgb.  9.8.  Pt reports onset 2 weeks she has had dark smears when she passed gas or wiped and weakness.   Yesterday she passed formed stool that was red or orange tinged.   Denies abd pain.  Appetite WNL.

## 2019-08-05 NOTE — ED Notes (Signed)
Pt's daughter states that she is taking her home because she "doesn't want her 83 year old mother waiting anymore."

## 2019-08-06 NOTE — ED Notes (Signed)
Called Pt for vitals no answer. 

## 2019-08-13 ENCOUNTER — Ambulatory Visit (INDEPENDENT_AMBULATORY_CARE_PROVIDER_SITE_OTHER): Payer: Medicare Other | Admitting: Gastroenterology

## 2019-08-13 ENCOUNTER — Encounter

## 2019-08-13 ENCOUNTER — Encounter: Payer: Self-pay | Admitting: Gastroenterology

## 2019-08-13 VITALS — BP 138/74 | HR 82 | Temp 98.4°F | Ht 60.0 in | Wt 125.2 lb

## 2019-08-13 DIAGNOSIS — D649 Anemia, unspecified: Secondary | ICD-10-CM | POA: Diagnosis not present

## 2019-08-13 DIAGNOSIS — Z1159 Encounter for screening for other viral diseases: Secondary | ICD-10-CM | POA: Diagnosis not present

## 2019-08-13 DIAGNOSIS — K921 Melena: Secondary | ICD-10-CM | POA: Diagnosis not present

## 2019-08-13 DIAGNOSIS — R195 Other fecal abnormalities: Secondary | ICD-10-CM | POA: Diagnosis not present

## 2019-08-13 NOTE — Progress Notes (Signed)
08/13/2019 [name] [id number] February 02, 1923   HISTORY OF PRESENT ILLNESS: This is a very pleasant 85 year old female who is new to our office.  She was referred here by her PCP, Dr. Unk Lightning, for evaluation regarding anemia and heme positive stools.  She is here today with her daughter.  Her hemoglobin in December 2019 was 12.3 g.  She apparently reported black stools last week and check of hemoglobin on October 28 was 9.8 g.  Stools were hemoccult positive.  The black stools resolved and recheck hemoglobin 3 days ago was up to 10.8 g.  She is now on an iron supplement so her stools are darker in color, but not black and tarry like they were last week when all of this transpired.  She tells me that her last colonoscopy was probably over 10 years ago with Dr. Melina Copa in [place].  She does not recall ever having an endoscopy.  Overall she is very healthy, takes minimal medications.  She helps care for her 40 year old husband at home.  She just had a knee replacement in July 2018.  She is a retired Therapist, sports.   Past Medical History:  Diagnosis Date  . Arthritis    "all over" (04/23/2017)  . GERD (gastroesophageal reflux disease)   . High cholesterol   . History of pelvic fracture 03/2016  . Hypertension   . Macular degeneration of both eyes   . Migraine    "started when I was in the 9th grade; lasted a couple years; started back in my 79's; had one daily right before OR" (04/23/2017)  . Type II diabetes mellitus ([place])    "took Metformin for awhile; made me sick; diabetes is diet controlled" (04/23/2017)   Past Surgical History:  Procedure Laterality Date  . ABDOMINAL HYSTERECTOMY    . BREAST CYST EXCISION Bilateral    "all benign"  . CARPAL TUNNEL RELEASE Right   . CATARACT EXTRACTION W/ INTRAOCULAR LENS  IMPLANT, BILATERAL Bilateral   . DILATION AND CURETTAGE OF UTERUS    . JOINT REPLACEMENT    . KYPHOPLASTY    . LAPAROSCOPIC CHOLECYSTECTOMY    . TONSILLECTOMY AND ADENOIDECTOMY     as  child  . TOTAL KNEE ARTHROPLASTY Right 04/22/2017  . TOTAL KNEE ARTHROPLASTY Right 04/22/2017   Procedure: TOTAL KNEE ARTHROPLASTY;  Surgeon: Vickey Huger, MD;  Location: [facility];  Service: Orthopedics;  Laterality: Right;    reports that she has never smoked. She has never used smokeless tobacco. She reports current alcohol use. She reports that she does not use drugs. family history is not on file. Allergies  Allergen Reactions  . Sulfamethoxazole Diarrhea and Other (See Comments)    GI UPSET      Outpatient Encounter Medications as of 08/13/2019  Medication Sig  . acetaminophen (TYLENOL) 650 MG CR tablet Take 650 mg by mouth every 8 (eight) hours as needed for pain.  Marland Kitchen atorvastatin (LIPITOR) 20 MG tablet Take 20 mg by mouth at bedtime.  Marland Kitchen CALCIUM CITRATE PO Take 1 tablet by mouth daily.  . Ferrous Gluconate (IRON 27 PO) Take 1 tablet by mouth 2 (two) times daily.  . Glucosamine-Chondroitin (COSAMIN DS PO) Take 1 tablet by mouth 2 (two) times daily.  Marland Kitchen lisinopril (PRINIVIL,ZESTRIL) 20 MG tablet Take 20 mg by mouth daily.  . Magnesium 250 MG TABS Take 250 mg by mouth at bedtime.  Marland Kitchen omeprazole (PRILOSEC) 40 MG capsule Take 40 mg by mouth 2 (two) times daily.   Marland Kitchen  Polyethyl Glycol-Propyl Glycol (SYSTANE) 0.4-0.3 % SOLN Place 1 drop into both eyes 2 (two) times daily as needed (for tired/dry eyes.).  Marland Kitchen zoledronic acid (RECLAST) 5 MG/100ML SOLN injection Inject 5 mg into the vein See admin instructions. Once a year.  . [DISCONTINUED] aspirin EC 325 MG EC tablet Take 1 tablet (325 mg total) by mouth 2 (two) times daily.  . [DISCONTINUED] HYDROcodone-acetaminophen (NORCO/VICODIN) 5-325 MG tablet Take 0.5-1 tablets by mouth every 6 (six) hours as needed for severe pain.  . [DISCONTINUED] methocarbamol (ROBAXIN) 500 MG tablet Take 1 tablet (500 mg total) by mouth every 6 (six) hours as needed for muscle spasms.  . [DISCONTINUED] Multiple Vitamins-Minerals (ICAPS MV PO) Take 1 capsule by mouth 2  (two) times daily.  . [DISCONTINUED] Omega-3 Fatty Acids (FISH OIL PO) Take 1 capsule by mouth daily.  . [DISCONTINUED] psyllium (METAMUCIL) 58.6 % powder Take 1 packet by mouth daily after lunch.  . [DISCONTINUED] traMADol (ULTRAM) 50 MG tablet Take 1-2 tablets (50-100 mg total) by mouth every 6 (six) hours.   No facility-administered encounter medications on file as of 08/13/2019.      REVIEW OF SYSTEMS  : All other systems reviewed and negative except where noted in the History of Present Illness.   PHYSICAL EXAM: BP 138/74 (BP Location: Left Arm, Patient Position: Sitting, Cuff Size: Normal)   Pulse 82   Temp 98.4 F (36.9 C) (Oral)   Ht 5' (1.524 m)   Wt 125 lb 4 oz (56.8 kg)   BMI 24.46 kg/m  General: Well developed white female in no acute distress Head: Normocephalic and atraumatic Eyes:  Sclerae anicteric, conjunctiva pink. Ears: Normal auditory acuity Lungs: Clear throughout to auscultation; no increased WOB. Heart: Regular rate and rhythm; no M/R/G. Abdomen: Soft, non-distended.  BS present.  Non-tender.  She did have a little bit of skin breakdown across her bra line/in her epigastric region. Musculoskeletal: Symmetrical with no gross deformities  Skin: No lesions on visible extremities Extremities: No edema  Neurological: Alert oriented x 4, grossly non-focal Psychological:  Alert and cooperative. Normal mood and affect  ASSESSMENT AND PLAN: 85 year old female with drop in hemoglobin, 12.3 in December 2019 and then 9.8 g just last week.  When hemoglobin was repeated this week it was up to 10.8 g.  She reported black stools and they were in fact heme positive.  Colonoscopy over 10 years ago.  Does not think she ever had an EGD.  Had a long conversation with the patient and her daughter.  We discussed options in regards to evaluation including EGD alone versus EGD and colonoscopy versus observation and close monitoring of hemoglobin.  She is at increased risk due to her  advanced age, but she is overall very healthy for 85 years old.  They decided to proceed with EGD alone first before putting her through a colonoscopy bowel preparation and colonoscopy procedure.  We will schedule with Dr. Bryan Lemma as he had something available on his schedule sooner and she also lives in [place].  **The risks, benefits, and alternatives to EGD were discussed with the patient and she consents to proceed.   **45 minutes were spent with the patient and her daughter, at least 50% of which was used to discuss options for evaluation, treatment, etc.  CC:  Myrlene Broker, MD

## 2019-08-13 NOTE — Patient Instructions (Signed)
If you are age 83 or older, your body mass index should be between 23-30. Your Body mass index is 24.46 kg/m. If this is out of the aforementioned range listed, please consider follow up with your Primary Care Provider.  If you are age 25 or younger, your body mass index should be between 19-25. Your Body mass index is 24.46 kg/m. If this is out of the aformentioned range listed, please consider follow up with your Primary Care Provider.   You have been scheduled for an endoscopy. Please follow written instructions given to you at your visit today. If you use inhalers (even only as needed), please bring them with you on the day of your procedure. Your physician has requested that you go to www.startemmi.com and enter the access code given to you at your visit today. This web site gives a general overview about your procedure. However, you should still follow specific instructions given to you by our office regarding your preparation for the procedure.  Thank you for choosing me and [facility].   Alonza Bogus, PA-C

## 2019-08-14 NOTE — Progress Notes (Signed)
Agree with the assessment and plan as outlined by Alonza Bogus, PA-C.  Plan for expedited EGD for diagnostic and potentially therapeutic intent.  Ja Pistole, DO, [facility]

## 2019-08-17 LAB — SARS CORONAVIRUS 2 (TAT 6-24 HRS): SARS Coronavirus 2: NEGATIVE

## 2019-08-18 ENCOUNTER — Other Ambulatory Visit (INDEPENDENT_AMBULATORY_CARE_PROVIDER_SITE_OTHER): Payer: Medicare Other

## 2019-08-18 ENCOUNTER — Other Ambulatory Visit: Payer: Self-pay | Admitting: Gastroenterology

## 2019-08-18 ENCOUNTER — Other Ambulatory Visit: Payer: Self-pay

## 2019-08-18 ENCOUNTER — Ambulatory Visit (AMBULATORY_SURGERY_CENTER): Payer: Medicare Other | Admitting: Gastroenterology

## 2019-08-18 ENCOUNTER — Encounter: Payer: Self-pay | Admitting: Gastroenterology

## 2019-08-18 VITALS — BP 148/63 | HR 79 | Temp 98.2°F | Resp 19 | Ht 61.0 in | Wt 125.0 lb

## 2019-08-18 DIAGNOSIS — K921 Melena: Secondary | ICD-10-CM | POA: Diagnosis not present

## 2019-08-18 DIAGNOSIS — K449 Diaphragmatic hernia without obstruction or gangrene: Secondary | ICD-10-CM

## 2019-08-18 DIAGNOSIS — K222 Esophageal obstruction: Secondary | ICD-10-CM | POA: Diagnosis not present

## 2019-08-18 DIAGNOSIS — D508 Other iron deficiency anemias: Secondary | ICD-10-CM

## 2019-08-18 DIAGNOSIS — R195 Other fecal abnormalities: Secondary | ICD-10-CM

## 2019-08-18 DIAGNOSIS — K317 Polyp of stomach and duodenum: Secondary | ICD-10-CM | POA: Diagnosis not present

## 2019-08-18 DIAGNOSIS — K2970 Gastritis, unspecified, without bleeding: Secondary | ICD-10-CM | POA: Diagnosis not present

## 2019-08-18 LAB — IBC + FERRITIN
Ferritin: 52.1 ng/mL (ref 10.0–291.0)
Iron: 88 ug/dL (ref 42–145)
Saturation Ratios: 22.1 % (ref 20.0–50.0)
Transferrin: 284 mg/dL (ref 212.0–360.0)

## 2019-08-18 LAB — B12 AND FOLATE PANEL
Folate: >24.1 ng/mL (ref >5.9)
Vitamin B-12: 1440 pg/mL — ABNORMAL HIGH (ref 211–911)

## 2019-08-18 MED ORDER — SODIUM CHLORIDE 0.9 % IV SOLN: 500.0000 mL | Freq: Once | INTRAVENOUS | Status: DC

## 2019-08-18 NOTE — Progress Notes (Signed)
Pt tolerated well. Teeth unchanged. Bilateral HA insitu. VSS. arousable and to recovery.

## 2019-08-18 NOTE — Progress Notes (Signed)
Called to room to assist during endoscopic procedure.  Patient ID and intended procedure confirmed with present staff. Received instructions for my participation in the procedure from the performing physician.  

## 2019-08-18 NOTE — Op Note (Signed)
[facility] Patient Name: [name] Procedure Date: 08/18/2019 1:51 PM MRN: [id number] Endoscopist: Gerrit Heck , MD Age: 85 Referring MD:  Date of Birth: Jul 27, 1923 Gender: Female Account #: [id number] Procedure:                Upper GI endoscopy Indications:              Heme positive stool, Melena, Anemia Medicines:                Monitored Anesthesia Care Procedure:                Pre-Anesthesia Assessment:                           - Prior to the procedure, a History and Physical                            was performed, and patient medications and                            allergies were reviewed. The patient's tolerance of                            previous anesthesia was also reviewed. The risks                            and benefits of the procedure and the sedation                            options and risks were discussed with the patient.                            All questions were answered, and informed consent                            was obtained. Prior Anticoagulants: The patient has                            taken no previous anticoagulant or antiplatelet                            agents. ASA Grade Assessment: II - A patient with                            mild systemic disease. After reviewing the risks                            and benefits, the patient was deemed in                            satisfactory condition to undergo the procedure.                           After obtaining informed consent, the endoscope was  passed under direct vision. Throughout the                            procedure, the patient's blood pressure, pulse, and                            oxygen saturations were monitored continuously. The                            Endoscope was introduced through the mouth, and                            advanced to the second part of duodenum. The upper                            GI endoscopy was  accomplished without difficulty.                            The patient tolerated the procedure well. Scope In: Scope Out: 2:17:42 PM Findings:                 A non-obstructing Schatzki ring was found in the                            lower third of the esophagus.                           The Z-line was regular and was found 35 cm from the                            incisors.                           A 3 cm hiatal hernia was present.                           Localized mild inflammation characterized by                            erythema was found at the incisura and in the                            gastric antrum. Biopsies were taken with a cold                            forceps for Helicobacter pylori testing. Estimated                            blood loss was minimal.                           A few small sessile polyps with no stigmata of                            recent  bleeding were found in the gastric fundus                            and in the gastric body. A few of these polyps were                            removed with a cold biopsy forceps. Resection and                            retrieval were complete. Estimated blood loss was                            minimal. The mucosa in the gastric body and fundus                            were otherwise normal. This was biopsied to                            evaluate for Helicobacter pylori. There was an area                            of apparent extraluminal compression in the mid                            gastric body. The overlying mucosa was normal                            appearing.                           The duodenal bulb, first portion of the duodenum                            and second portion of the duodenum were normal.                            Biopsies for histology were taken with a cold                            forceps for evaluation of celiac disease. Estimated                            blood  loss was minimal. Complications:            No immediate complications. Estimated Blood Loss:     Estimated blood loss was minimal. Impression:               - Non-obstructing Schatzki ring.                           - Z-line regular, 35 cm from the incisors.                           - 3 cm hiatal hernia.                           -  Mild, non-ulcer gastritis. Biopsied.                           - A few gastric polyps. Resected and retrieved.                           - Normal duodenal bulb, first portion of the                            duodenum and second portion of the duodenum.                            Biopsied.                           - No ulcers, erosions, or areas of active bleeding                            or stigmata of bleeding. Recommendation:           - Patient has a contact number available for                            emergencies. The signs and symptoms of potential                            delayed complications were discussed with the                            patient. Return to normal activities tomorrow.                            Written discharge instructions were provided to the                            patient.                           - Resume previous diet.                           - Continue present medications.                           - Await pathology results.                           - Return to GI clinic at appointment to be                            scheduled.                           - Check ferritin, iron panel, B12, folate.                           - Recommend scheduling colonoscopy for further  evaluation of anemia and FOBT+ stools. Gerrit Heck, MD 08/18/2019 2:30:44 PM

## 2019-08-18 NOTE — Patient Instructions (Signed)
HANDOUTS PROVIDED ON: HIATAL HERNIA   THE BIOPSIES TAKEN TODAY HAVE BEEN SENT FOR PATHOLOGY.  THE RESULTS CAN TAKE 2-3 WEEKS TO RECEIVE.   YOU MAY RESUME YOUR PREVIOUS DIET AND MEDICATION SCHEDULE.  THE GI OFFICE WILL CONTACT YOU WITH A FOLLOW UP APPOINTMENT AND TO SCHEDULE A COLONOSCOPY.  [place] YOU FOR ALLOWING [place] TO CARE FOR YOU TODAY!!!  YOU HAD AN ENDOSCOPIC PROCEDURE TODAY AT [facility]:   Refer to the procedure report that was given to you for any specific questions about what was found during the examination.  If the procedure report does not answer your questions, please call your gastroenterologist to clarify.  If you requested that your care partner not be given the details of your procedure findings, then the procedure report has been included in a sealed envelope for you to review at your convenience later.  YOU SHOULD EXPECT: Some feelings of bloating in the abdomen. Passage of more gas than usual.  Walking can help get rid of the air that was put into your GI tract during the procedure and reduce the bloating. If you had a lower endoscopy (such as a colonoscopy or flexible sigmoidoscopy) you may notice spotting of blood in your stool or on the toilet paper. If you underwent a bowel prep for your procedure, you may not have a normal bowel movement for a few days.  Please Note:  You might notice some irritation and congestion in your nose or some drainage.  This is from the oxygen used during your procedure.  There is no need for concern and it should clear up in a day or so.  SYMPTOMS TO REPORT IMMEDIATELY:   Following upper endoscopy (EGD)  Vomiting of blood or coffee ground material  New chest pain or pain under the shoulder blades  Painful or persistently difficult swallowing  New shortness of breath  Fever of 100F or higher  Black, tarry-looking stools  For urgent or emergent issues, a gastroenterologist can be reached at any hour by calling [phone]  [phone].   DIET:  We do recommend a small meal at first, but then you may proceed to your regular diet.  Drink plenty of fluids but you should avoid alcoholic beverages for 24 hours.  ACTIVITY:  You should plan to take it easy for the rest of today and you should NOT DRIVE or use heavy machinery until tomorrow (because of the sedation medicines used during the test).    FOLLOW UP: Our staff will call the number listed on your records 48-72 hours following your procedure to check on you and address any questions or concerns that you may have regarding the information given to you following your procedure. If we do not reach you, we will leave a message.  We will attempt to reach you two times.  During this call, we will ask if you have developed any symptoms of COVID 19. If you develop any symptoms (ie: fever, flu-like symptoms, shortness of breath, cough etc.) before then, please call [phone].  If you test positive for Covid 19 in the 2 weeks post procedure, please call and report this information to [place].    If any biopsies were taken you will be contacted by phone or by letter within the next 1-3 weeks.  Please call us at [phone] if you have not heard about the biopsies in 3 weeks.    SIGNATURES/CONFIDENTIALITY: You and/or your care partner have signed paperwork which will be entered into your  electronic medical record.  These signatures attest to the fact that that the information above on your After Visit Summary has been reviewed and is understood.  Full responsibility of the confidentiality of this discharge information lies with you and/or your care-partner.

## 2019-08-20 ENCOUNTER — Telehealth: Payer: Self-pay

## 2019-08-20 DIAGNOSIS — D508 Other iron deficiency anemias: Secondary | ICD-10-CM

## 2019-08-20 DIAGNOSIS — R195 Other fecal abnormalities: Secondary | ICD-10-CM

## 2019-08-20 MED ORDER — NA SULFATE-K SULFATE-MG SULF 17.5-3.13-1.6 GM/177ML PO SOLN: 1.0000 | Freq: Once | ORAL | 0 refills | Status: AC

## 2019-08-20 NOTE — Telephone Encounter (Signed)
First attempt follow up call to pt, lm on daughter's vm.

## 2019-08-20 NOTE — Telephone Encounter (Signed)
Called and spoke with patient's daughter-[name]-verified DPR- [name] reports the patient is really weak and wants to know if the patient can go ahead and be scheduled for her colonoscopy-RN spoke with Dr. Bryan Lemma and received the verbal order for the patient to be scheduled at next earliest appt- RN called and spoke with patient's daughter-[name] was informed of MD recommendations and has scheduled the patient for her COVID screening on 08/24/2019 at 2:20 pm; colonoscopy has been scheduled on 08/26/2019 at 2:00 pm; [name] verbalized she needed the instructions emailed to her at [email or web address]-this has been done as well as the instructions being mailed to the [place] advised to call back to the office should questions or concerns arise; [name] verbalized understanding of information/instructions;

## 2019-08-20 NOTE — Telephone Encounter (Signed)
Pt said she is still very weak  Cleda Mccreedy [phone]

## 2019-08-20 NOTE — Telephone Encounter (Signed)
  Follow up Call-  Call back number 08/18/2019  Post procedure Call Back phone  # Daughter Dorian Pod  [phone]  Pt. is heard of hearing.  Permission to leave phone message Yes  Some recent data might be hidden     Patient questions:  Do you have a fever, pain , or abdominal swelling? No. Pain Score  0 *  Have you tolerated food without any problems? Yes.    Have you been able to return to your normal activities? Yes.    Do you have any questions about your discharge instructions: Diet   No. Medications  No. Follow up visit  No.  Do you have questions or concerns about your Care? No.  Actions: * If pain score is 4 or above: No action needed, pain <4. 1. Have you developed a fever since your procedure? no  2.   Have you had an respiratory symptoms (SOB or cough) since your procedure? no  3.   Have you tested positive for COVID 19 since your procedure no  4.   Have you had any family members/close contacts diagnosed with the COVID 19 since your procedure?  no   If yes to any of these questions please route to Joylene John, RN and Alphonsa Gin, Therapist, sports.

## 2019-08-21 ENCOUNTER — Telehealth: Payer: Self-pay | Admitting: Gastroenterology

## 2019-08-21 DIAGNOSIS — R195 Other fecal abnormalities: Secondary | ICD-10-CM

## 2019-08-21 DIAGNOSIS — K449 Diaphragmatic hernia without obstruction or gangrene: Secondary | ICD-10-CM

## 2019-08-21 DIAGNOSIS — D508 Other iron deficiency anemias: Secondary | ICD-10-CM

## 2019-08-21 DIAGNOSIS — K921 Melena: Secondary | ICD-10-CM

## 2019-08-21 MED ORDER — NA SULFATE-K SULFATE-MG SULF 17.5-3.13-1.6 GM/177ML PO SOLN: 1.0000 | Freq: Once | ORAL | 0 refills | Status: AC

## 2019-08-21 NOTE — Telephone Encounter (Signed)
Second follow up call made per Arman Bogus, RN

## 2019-08-21 NOTE — Telephone Encounter (Signed)
Called and spoke with patient's daughter-Ellen-verified DPR-Ellen was informed that the prep for the patient is suprep and the RX has been sent to the pharmacy-Ellen is appreciative of this information and will contact the office if further questions arise; Dorian Pod verbalized understanding of information/instructions;

## 2019-08-24 ENCOUNTER — Ambulatory Visit (INDEPENDENT_AMBULATORY_CARE_PROVIDER_SITE_OTHER): Payer: Medicare Other

## 2019-08-24 ENCOUNTER — Other Ambulatory Visit: Payer: Self-pay | Admitting: Gastroenterology

## 2019-08-24 DIAGNOSIS — Z1159 Encounter for screening for other viral diseases: Secondary | ICD-10-CM

## 2019-08-25 LAB — SARS CORONAVIRUS 2 (TAT 6-24 HRS): SARS Coronavirus 2: NEGATIVE

## 2019-08-26 ENCOUNTER — Ambulatory Visit (AMBULATORY_SURGERY_CENTER): Payer: Medicare Other | Admitting: Gastroenterology

## 2019-08-26 ENCOUNTER — Other Ambulatory Visit: Payer: Self-pay

## 2019-08-26 ENCOUNTER — Encounter: Payer: Self-pay | Admitting: Gastroenterology

## 2019-08-26 VITALS — BP 118/80 | HR 78 | Temp 97.9°F | Resp 13

## 2019-08-26 DIAGNOSIS — R195 Other fecal abnormalities: Secondary | ICD-10-CM

## 2019-08-26 DIAGNOSIS — K641 Second degree hemorrhoids: Secondary | ICD-10-CM

## 2019-08-26 DIAGNOSIS — D120 Benign neoplasm of cecum: Secondary | ICD-10-CM

## 2019-08-26 DIAGNOSIS — K5140 Inflammatory polyps of colon without complications: Secondary | ICD-10-CM

## 2019-08-26 DIAGNOSIS — K5730 Diverticulosis of large intestine without perforation or abscess without bleeding: Secondary | ICD-10-CM | POA: Diagnosis not present

## 2019-08-26 DIAGNOSIS — K529 Noninfective gastroenteritis and colitis, unspecified: Secondary | ICD-10-CM

## 2019-08-26 MED ORDER — SODIUM CHLORIDE 0.9 % IV SOLN: 500.0000 mL | Freq: Once | INTRAVENOUS | Status: DC

## 2019-08-26 NOTE — Progress Notes (Signed)
A/ox3, pleased with MAC, report to RN 

## 2019-08-26 NOTE — Progress Notes (Signed)
Pt's states no medical or surgical changes since previsit or office visit.  Covid- June Vitals- Courtney  

## 2019-08-26 NOTE — Progress Notes (Signed)
Called to room to assist during endoscopic procedure.  Patient ID and intended procedure confirmed with present staff. Received instructions for my participation in the procedure from the performing physician.  

## 2019-08-26 NOTE — Op Note (Signed)
[facility] Patient Name: [name] Procedure Date: 08/26/2019 2:18 PM MRN: [id number] Endoscopist: Gerrit Heck , MD Age: 85 Referring MD:  Date of Birth: [birth year] Gender: Female Account #: [id number] Procedure:                Colonoscopy Indications:              Heme positive stool, Anemia                           85 yo female with normocytic anemia, dark stools,                            and FOBT+ stools. Recent EGD otherwise unrevealing. Medicines:                Monitored Anesthesia Care Procedure:                Pre-Anesthesia Assessment:                           - Prior to the procedure, a History and Physical                            was performed, and patient medications and                            allergies were reviewed. The patient's tolerance of                            previous anesthesia was also reviewed. The risks                            and benefits of the procedure and the sedation                            options and risks were discussed with the patient.                            All questions were answered, and informed consent                            was obtained. Prior Anticoagulants: The patient has                            taken no previous anticoagulant or antiplatelet                            agents. ASA Grade Assessment: II - A patient with                            mild systemic disease. After reviewing the risks                            and benefits, the patient was deemed in  satisfactory condition to undergo the procedure.                           After obtaining informed consent, the colonoscope                            was passed under direct vision. Throughout the                            procedure, the patient's blood pressure, pulse, and                            oxygen saturations were monitored continuously. The                            Colonoscope was introduced  through the anus and                            advanced to the the cecum, identified by                            appendiceal orifice and ileocecal valve. The                            colonoscopy was performed without difficulty. The                            patient tolerated the procedure well. The quality                            of the bowel preparation was excellent. The                            ileocecal valve, appendiceal orifice, and rectum                            were photographed. Scope In: 2:29:27 PM Scope Out: [id number] PM Scope Withdrawal Time: 0 hours 11 minutes 45 seconds  Total Procedure Duration: 0 hours 17 minutes 32 seconds  Findings:                 Hemorrhoids were found on perianal exam.                           A 2 mm polyp was found in the cecum, adjacent to                            the appendiceal orifice. The polyp was sessile. The                            polyp was removed with a cold biopsy forceps.                            Resection and retrieval were complete. Estimated  blood loss was minimal.                           Multiple small and large-mouthed diverticula were                            found in the sigmoid colon.                           A localized area of mildly erythematous mucosa was                            found in the sigmoid colon, located 20-22 cm from                            the anal verge. This was located adjacent to dense                            diverticlosis. Biopsies were taken with a cold                            forceps for histology. Estimated blood loss was                            minimal.                           Non-bleeding internal hemorrhoids were found during                            retroflexion. The hemorrhoids were small.                           The exam was otherwise normal throughout the                            remainder of the colon. Complications:             No immediate complications. Estimated Blood Loss:     Estimated blood loss was minimal. Impression:               - Hemorrhoids found on perianal exam.                           - One 2 mm polyp in the cecum, removed with a cold                            biopsy forceps. Resected and retrieved.                           - Diverticulosis in the sigmoid colon.                           - Erythematous mucosa in the sigmoid colon.  Biopsied.                           - Non-bleeding internal hemorrhoids. Recommendation:           - Patient has a contact number available for                            emergencies. The signs and symptoms of potential                            delayed complications were discussed with the                            patient. Return to normal activities tomorrow.                            Written discharge instructions were provided to the                            patient.                           - Resume previous diet.                           - Continue present medications.                           - Await pathology results.                           - Repeat colonoscopy for routine                            screening/surveillance is not recommended due to                            current age (49 years or older).                           - Return to GI clinic at appointment to be                            scheduled.                           - Use fiber, for example Citrucel, Fibercon, Konsyl                            or Metamucil. Gerrit Heck, MD 08/26/2019 2:54:38 PM

## 2019-08-26 NOTE — Patient Instructions (Signed)
HANDOUTS PROVIDED ON: POLYPS, DIVERTICULOSIS, & HEMORRHOIDS   THE POLYP TAKEN TODAY HAVE BEEN SENT FOR PATHOLOGY.  THE RESULTS CAN TAKE 2-3 WEEKS TO RECEIVE.    YOU MAY RESUME YOUR PREVIOUS DIET AND MEDICATION SCHEDULE.  ADD A FIBER SUPPLEMENT TO YOUR DIET.  SOME EXAMPLES ARE CITRUCEL, FIBERCON, KONSYL, OR METAMUCIL.  [place] YOU FOR ALLOWING [place] TO CARE FOR YOU TODAY!!!  YOU HAD AN ENDOSCOPIC PROCEDURE TODAY AT [facility]:   Refer to the procedure report that was given to you for any specific questions about what was found during the examination.  If the procedure report does not answer your questions, please call your gastroenterologist to clarify.  If you requested that your care partner not be given the details of your procedure findings, then the procedure report has been included in a sealed envelope for you to review at your convenience later.  YOU SHOULD EXPECT: Some feelings of bloating in the abdomen. Passage of more gas than usual.  Walking can help get rid of the air that was put into your GI tract during the procedure and reduce the bloating. If you had a lower endoscopy (such as a colonoscopy or flexible sigmoidoscopy) you may notice spotting of blood in your stool or on the toilet paper. If you underwent a bowel prep for your procedure, you may not have a normal bowel movement for a few days.  Please Note:  You might notice some irritation and congestion in your nose or some drainage.  This is from the oxygen used during your procedure.  There is no need for concern and it should clear up in a day or so.  SYMPTOMS TO REPORT IMMEDIATELY:   Following lower endoscopy (colonoscopy or flexible sigmoidoscopy):  Excessive amounts of blood in the stool  Significant tenderness or worsening of abdominal pains  Swelling of the abdomen that is new, acute  Fever of 100F or higher  For urgent or emergent issues, a gastroenterologist can be reached at any hour by calling [phone]  [phone].   DIET:  We do recommend a small meal at first, but then you may proceed to your regular diet.  Drink plenty of fluids but you should avoid alcoholic beverages for 24 hours.  ACTIVITY:  You should plan to take it easy for the rest of today and you should NOT DRIVE or use heavy machinery until tomorrow (because of the sedation medicines used during the test).    FOLLOW UP: Our staff will call the number listed on your records 48-72 hours following your procedure to check on you and address any questions or concerns that you may have regarding the information given to you following your procedure. If we do not reach you, we will leave a message.  We will attempt to reach you two times.  During this call, we will ask if you have developed any symptoms of COVID 19. If you develop any symptoms (ie: fever, flu-like symptoms, shortness of breath, cough etc.) before then, please call [phone].  If you test positive for Covid 19 in the 2 weeks post procedure, please call and report this information to [place].    If any biopsies were taken you will be contacted by phone or by letter within the next 1-3 weeks.  Please call us at [phone] if you have not heard about the biopsies in 3 weeks.    SIGNATURES/CONFIDENTIALITY: You and/or your care partner have signed paperwork which will be entered into your electronic medical  record.  These signatures attest to the fact that that the information above on your After Visit Summary has been reviewed and is understood.  Full responsibility of the confidentiality of this discharge information lies with you and/or your care-partner. 

## 2019-08-27 ENCOUNTER — Encounter: Payer: Self-pay | Admitting: Gastroenterology

## 2019-08-28 ENCOUNTER — Telehealth: Payer: Self-pay | Admitting: *Deleted

## 2019-08-28 NOTE — Telephone Encounter (Signed)
Second follow up call attempt.  No answer. 

## 2019-08-28 NOTE — Telephone Encounter (Signed)
  Follow up Call-  Call back number 08/26/2019 08/18/2019  Post procedure Call Back phone  # [phone] Daughter Dorian Pod  [phone]  Pt. is heard of hearing.  Permission to leave phone message Yes Yes  Some recent data might be hidden     Patient questions:  Message left to call us if necessary.

## 2019-09-02 ENCOUNTER — Encounter: Payer: Self-pay | Admitting: Gastroenterology

## 2019-10-07 ENCOUNTER — Ambulatory Visit: Payer: Medicare Other | Admitting: Gastroenterology

## 2019-11-03 ENCOUNTER — Telehealth (INDEPENDENT_AMBULATORY_CARE_PROVIDER_SITE_OTHER): Payer: Medicare Other | Admitting: Gastroenterology

## 2019-11-03 ENCOUNTER — Other Ambulatory Visit: Payer: Self-pay

## 2019-11-03 VITALS — Ht 65.0 in | Wt 120.0 lb

## 2019-11-03 DIAGNOSIS — D649 Anemia, unspecified: Secondary | ICD-10-CM

## 2019-11-03 DIAGNOSIS — K641 Second degree hemorrhoids: Secondary | ICD-10-CM | POA: Diagnosis not present

## 2019-11-03 DIAGNOSIS — K449 Diaphragmatic hernia without obstruction or gangrene: Secondary | ICD-10-CM | POA: Diagnosis not present

## 2019-11-03 DIAGNOSIS — K5730 Diverticulosis of large intestine without perforation or abscess without bleeding: Secondary | ICD-10-CM | POA: Diagnosis not present

## 2019-11-03 DIAGNOSIS — K317 Polyp of stomach and duodenum: Secondary | ICD-10-CM

## 2019-11-03 DIAGNOSIS — K649 Unspecified hemorrhoids: Secondary | ICD-10-CM

## 2019-11-03 DIAGNOSIS — K222 Esophageal obstruction: Secondary | ICD-10-CM

## 2019-11-03 DIAGNOSIS — K219 Gastro-esophageal reflux disease without esophagitis: Secondary | ICD-10-CM

## 2019-11-03 NOTE — Patient Instructions (Signed)
Return as needed.   It was a pleasure to see you today!  Vito Cirigliano, D.O.  

## 2019-11-03 NOTE — Progress Notes (Signed)
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  Had initially reported black stools, which resolved.  -09/2018: Hgb 12.3 -07/2019: Hgb 9.8, FOBT positive stool, black stools/melena -08/2019: Hgb 10.8.  Started on iron supplement, and stools back to black, but not tarry, normal B12, folate, iron panel  Endoscopic history: -EGD (08/2019, Dr. Bryan Lemma): Nonobstructing Schatzki's ring, 3 cm HH, mild non-H. pylori gastritis, benign gastric polyps, normal duodenum (biopsies normal) -Colonoscopy (08/2019, Dr. Bryan Lemma): 2 mm inflammatory cecal polyp, 2 cm segment of mild erythema in the sigmoid colon (biopsy: Nonspecific inflammation), sigmoid diverticulosis, internal hemorrhoids  HPI:    Due to current restrictions/limitations of in-office visits due to the COVID-19 pandemic, this scheduled clinical appointment was converted to a telehealth consultation via telephone.  -Time of medical discussion: 18 minutes -The patient did consent to this telephone visit and is aware of possible charges through their insurance for this visit.  -Names of all parties present: [name] (patient), patient's daughter, Gerrit Heck, DO, [facility] (physician)  [name] is a 86 y.o. female presenting to the Gastroenterology Clinic for routine follow-up.  Initially seen in 08/2019 as outlined above, with subsequent expedited EGD in 08/2019.  EGD largely unrevealing, so referred for colonoscopy which was also largely unrevealing.  Internal hemorrhoids noted, which certainly could have caused FOBT positive.  Today, she states bowel habits back to normal 1-2 formed stools. Good PO intake, weight stable. No abdominal pain, n/v/f/c/d/c. Still taking ferrous sulfate.  Takes Metamucil  daily with soft stools without straining to have BM.  States she is doing quite well without any complaints.  Past medical history, past surgical history, social history, family history, medications, and allergies reviewed in the chart and with patient over the phone.  Past Medical History:  Diagnosis Date  . Anemia   . Anxiety   . Arthritis    "all over" (04/23/2017)  . Cataract 2015   bilateral cat. extr.  . Depression   . GERD (gastroesophageal reflux disease)   . Heart murmur   . High cholesterol   . History of pelvic fracture 03/2016  . Hypertension   . Macular degeneration of both eyes   . Migraine    "started when I was in the 9th grade; lasted a couple years; started back in my 54's; had one daily right before OR" (04/23/2017)  . Osteoporosis   . Type II diabetes mellitus ([place])    "took Metformin for awhile; made me sick; diabetes is diet controlled" (04/23/2017)     Past Surgical History:  Procedure Laterality Date  . ABDOMINAL HYSTERECTOMY    . APPENDECTOMY    . BREAST CYST EXCISION Bilateral    "all benign"  . CARPAL TUNNEL RELEASE Right   . CATARACT EXTRACTION W/ INTRAOCULAR LENS  IMPLANT, BILATERAL Bilateral   . DILATION AND CURETTAGE OF UTERUS    . JOINT REPLACEMENT    . KYPHOPLASTY    . LAPAROSCOPIC CHOLECYSTECTOMY    . TONSILLECTOMY AND ADENOIDECTOMY     as child  . TOTAL KNEE ARTHROPLASTY Right 04/22/2017  . TOTAL KNEE ARTHROPLASTY Right 04/22/2017   Procedure: TOTAL KNEE ARTHROPLASTY;  Surgeon: Vickey Huger, MD;  Location: [facility];  Service: Orthopedics;  Laterality: Right;   Family History  Problem Relation  Age of Onset  . Colon cancer Father   . Diabetes Father   . Pancreatic cancer Father   . Colon cancer Sister    Social History   Tobacco Use  . Smoking status: Never Smoker  . Smokeless tobacco: Never Used  Substance Use Topics  . Alcohol use: Not Currently    Comment: 04/23/2017 "glass of wine q couple months; if that"  . Drug use: No    Current Outpatient Medications  Medication Sig Dispense Refill  . acetaminophen (TYLENOL) 650 MG CR tablet Take 650 mg by mouth every 8 (eight) hours as needed for pain.    Marland Kitchen atorvastatin (LIPITOR) 20 MG tablet Take 20 mg by mouth at bedtime.    Marland Kitchen CALCIUM CITRATE PO Take 1 tablet by mouth daily.    . COD LIVER OIL PO Take 1 tablet by mouth 2 (two) times daily.    . Ferrous Gluconate (IRON 27 PO) Take 1 tablet by mouth 2 (two) times daily.    . Glucosamine-Chondroitin (COSAMIN DS PO) Take 1 tablet by mouth 2 (two) times daily. 1000mg  daily    . lisinopril (PRINIVIL,ZESTRIL) 20 MG tablet Take 20 mg by mouth daily.    . Magnesium 250 MG TABS Take 250 mg by mouth at bedtime.    . meloxicam (MOBIC) 7.5 MG tablet Take 7.5 mg by mouth daily.    . Multiple Vitamins-Minerals (ICAPS PO) Take 1 tablet by mouth 2 (two) times daily.    Marland Kitchen omeprazole (PRILOSEC) 40 MG capsule Take 40 mg by mouth daily.     Marland Kitchen PARoxetine (PAXIL) 10 MG tablet Take 10 mg by mouth daily.    Vladimir Faster Glycol-Propyl Glycol (SYSTANE) 0.4-0.3 % SOLN Place 1 drop into both eyes 2 (two) times daily as needed (for tired/dry eyes.).    Marland Kitchen zoledronic acid (RECLAST) 5 MG/100ML SOLN injection Inject 5 mg into the vein See admin instructions. Once a year.     No current facility-administered medications for this visit.   Allergies  Allergen Reactions  . Sulfamethoxazole Diarrhea and Other (See Comments)    GI UPSET     Review of Systems: All systems reviewed and negative except where noted in HPI.     Physical Exam:    Physical exam not completed due to the nature of this telehealth communication.  Patient was otherwise alert and oriented and well communicative.   ASSESSMENT AND PLAN;   1) Anemia -Recent onset normocytic anemia without iron, B12, folate deficiency.  Had an uptrending hemoglobin in 08/2019, then was started on iron therapy.  Otherwise without overt GI blood loss.  EGD/colonoscopy otherwise largely  unrevealing -Repeat CBC with next set of labs.  Patient requests to have these done with her PCM, which I feel is reasonable -If hemoglobin back to baseline, can stop oral iron therapy -No further endoscopic evaluation needed at this time  2) Internal hemorrhoids -Essentially asymptomatic.  Continue Metamucil  3) Diverticulosis -Left-sided diverticulosis with a small segment of possible diverticular associated colitis noted on colonoscopy.  Otherwise asymptomatic, so no plan for 5-ASA therapy or other intervention.  No overt GI blood loss -Resume Metamucil as already doing  4) GERD 5) Hiatal hernia 6) Schatzki's ring -Reflux symptoms well-controlled on Prilosec -Schatzki's ring is nonobstructing.  No clinical dysphagia.  No need for repeat with dilation  RTC as needed     Lavena Bullion, DO, FACG  11/03/2019, 1:15 PM   Myrlene Broker, MD

## 2020-03-08 ENCOUNTER — Inpatient Hospital Stay (HOSPITAL_COMMUNITY)
Admission: EM | Admit: 2020-03-08 | Discharge: 2020-03-15 | DRG: 552 | Disposition: A | Discharge status: Home Health | Payer: Medicare Other | Attending: Internal Medicine | Admitting: Internal Medicine

## 2020-03-08 ENCOUNTER — Emergency Department (HOSPITAL_COMMUNITY): Payer: Medicare Other

## 2020-03-08 ENCOUNTER — Encounter (HOSPITAL_COMMUNITY): Payer: Self-pay

## 2020-03-08 ENCOUNTER — Other Ambulatory Visit: Payer: Self-pay

## 2020-03-08 DIAGNOSIS — S22089A Unspecified fracture of T11-T12 vertebra, initial encounter for closed fracture: Principal | ICD-10-CM

## 2020-03-08 DIAGNOSIS — Z9841 Cataract extraction status, right eye: Secondary | ICD-10-CM

## 2020-03-08 DIAGNOSIS — Z800 Family history of malignant neoplasm of digestive organs: Secondary | ICD-10-CM

## 2020-03-08 DIAGNOSIS — E1169 Type 2 diabetes mellitus with other specified complication: Secondary | ICD-10-CM | POA: Diagnosis present

## 2020-03-08 DIAGNOSIS — Z961 Presence of intraocular lens: Secondary | ICD-10-CM | POA: Diagnosis present

## 2020-03-08 DIAGNOSIS — E119 Type 2 diabetes mellitus without complications: Secondary | ICD-10-CM

## 2020-03-08 DIAGNOSIS — Y92009 Unspecified place in unspecified non-institutional (private) residence as the place of occurrence of the external cause: Secondary | ICD-10-CM

## 2020-03-08 DIAGNOSIS — M549 Dorsalgia, unspecified: Secondary | ICD-10-CM | POA: Diagnosis present

## 2020-03-08 DIAGNOSIS — F0390 Unspecified dementia without behavioral disturbance: Secondary | ICD-10-CM | POA: Diagnosis present

## 2020-03-08 DIAGNOSIS — Z79890 Hormone replacement therapy (postmenopausal): Secondary | ICD-10-CM

## 2020-03-08 DIAGNOSIS — E871 Hypo-osmolality and hyponatremia: Secondary | ICD-10-CM | POA: Diagnosis present

## 2020-03-08 DIAGNOSIS — W1830XA Fall on same level, unspecified, initial encounter: Secondary | ICD-10-CM | POA: Diagnosis present

## 2020-03-08 DIAGNOSIS — Z9049 Acquired absence of other specified parts of digestive tract: Secondary | ICD-10-CM

## 2020-03-08 DIAGNOSIS — M810 Age-related osteoporosis without current pathological fracture: Secondary | ICD-10-CM | POA: Diagnosis present

## 2020-03-08 DIAGNOSIS — F329 Major depressive disorder, single episode, unspecified: Secondary | ICD-10-CM | POA: Diagnosis present

## 2020-03-08 DIAGNOSIS — Z882 Allergy status to sulfonamides status: Secondary | ICD-10-CM

## 2020-03-08 DIAGNOSIS — Z833 Family history of diabetes mellitus: Secondary | ICD-10-CM

## 2020-03-08 DIAGNOSIS — E7800 Pure hypercholesterolemia, unspecified: Secondary | ICD-10-CM | POA: Diagnosis present

## 2020-03-08 DIAGNOSIS — E1159 Type 2 diabetes mellitus with other circulatory complications: Secondary | ICD-10-CM

## 2020-03-08 DIAGNOSIS — W19XXXA Unspecified fall, initial encounter: Secondary | ICD-10-CM

## 2020-03-08 DIAGNOSIS — Z96651 Presence of right artificial knee joint: Secondary | ICD-10-CM | POA: Diagnosis present

## 2020-03-08 DIAGNOSIS — Z791 Long term (current) use of non-steroidal anti-inflammatories (NSAID): Secondary | ICD-10-CM

## 2020-03-08 DIAGNOSIS — R911 Solitary pulmonary nodule: Secondary | ICD-10-CM | POA: Diagnosis present

## 2020-03-08 DIAGNOSIS — K219 Gastro-esophageal reflux disease without esophagitis: Secondary | ICD-10-CM | POA: Diagnosis present

## 2020-03-08 DIAGNOSIS — Z66 Do not resuscitate: Secondary | ICD-10-CM | POA: Diagnosis present

## 2020-03-08 DIAGNOSIS — Z9181 History of falling: Secondary | ICD-10-CM

## 2020-03-08 DIAGNOSIS — Y92098 Other place in other non-institutional residence as the place of occurrence of the external cause: Secondary | ICD-10-CM

## 2020-03-08 DIAGNOSIS — K5903 Drug induced constipation: Secondary | ICD-10-CM | POA: Diagnosis present

## 2020-03-08 DIAGNOSIS — S22009A Unspecified fracture of unspecified thoracic vertebra, initial encounter for closed fracture: Secondary | ICD-10-CM

## 2020-03-08 DIAGNOSIS — Z79899 Other long term (current) drug therapy: Secondary | ICD-10-CM

## 2020-03-08 DIAGNOSIS — T402X5A Adverse effect of other opioids, initial encounter: Secondary | ICD-10-CM | POA: Diagnosis present

## 2020-03-08 DIAGNOSIS — F419 Anxiety disorder, unspecified: Secondary | ICD-10-CM | POA: Diagnosis present

## 2020-03-08 DIAGNOSIS — Z20822 Contact with and (suspected) exposure to covid-19: Secondary | ICD-10-CM | POA: Diagnosis present

## 2020-03-08 DIAGNOSIS — Z90710 Acquired absence of both cervix and uterus: Secondary | ICD-10-CM

## 2020-03-08 DIAGNOSIS — I152 Hypertension secondary to endocrine disorders: Secondary | ICD-10-CM | POA: Diagnosis present

## 2020-03-08 DIAGNOSIS — Z9842 Cataract extraction status, left eye: Secondary | ICD-10-CM

## 2020-03-08 DIAGNOSIS — E039 Hypothyroidism, unspecified: Secondary | ICD-10-CM | POA: Diagnosis present

## 2020-03-08 DIAGNOSIS — E785 Hyperlipidemia, unspecified: Secondary | ICD-10-CM | POA: Diagnosis present

## 2020-03-08 DIAGNOSIS — F32A Depression, unspecified: Secondary | ICD-10-CM

## 2020-03-08 LAB — COMPREHENSIVE METABOLIC PANEL
ALT: 27 U/L (ref 0–44)
AST: 31 U/L (ref 15–41)
Albumin: 3.3 g/dL — ABNORMAL LOW (ref 3.5–5.0)
Alkaline Phosphatase: 79 U/L (ref 38–126)
Anion gap: 14 (ref 5–15)
BUN: 22 mg/dL (ref 8–23)
CO2: 24 mmol/L (ref 22–32)
Calcium: 9 mg/dL (ref 8.9–10.3)
Chloride: 94 mmol/L — ABNORMAL LOW (ref 98–111)
Creatinine, Ser: 0.71 mg/dL (ref 0.44–1.00)
GFR calc Af Amer: >60 mL/min (ref >60)
GFR calc non Af Amer: >60 mL/min (ref >60)
Glucose, Bld: 122 mg/dL — ABNORMAL HIGH (ref 70–99)
Potassium: 4.2 mmol/L (ref 3.5–5.1)
Sodium: 132 mmol/L — ABNORMAL LOW (ref 135–145)
Total Bilirubin: 1.1 mg/dL (ref 0.3–1.2)
Total Protein: 6.5 g/dL (ref 6.5–8.1)

## 2020-03-08 LAB — URINALYSIS, ROUTINE W REFLEX MICROSCOPIC
Bacteria, UA: NONE SEEN
Bilirubin Urine: NEGATIVE
Glucose, UA: NEGATIVE mg/dL
Hgb urine dipstick: NEGATIVE
Ketones, ur: NEGATIVE mg/dL
Leukocytes,Ua: NEGATIVE
Nitrite: NEGATIVE
Protein, ur: 30 mg/dL — AB
Specific Gravity, Urine: 1.01 (ref 1.005–1.030)
pH: 7 (ref 5.0–8.0)

## 2020-03-08 LAB — CBC WITH DIFFERENTIAL/PLATELET
Abs Immature Granulocytes: 0.06 10*3/uL (ref 0.00–0.07)
Basophils Absolute: 0 10*3/uL (ref 0.0–0.1)
Basophils Relative: 1 %
Eosinophils Absolute: 0.1 10*3/uL (ref 0.0–0.5)
Eosinophils Relative: 2 %
HCT: 35.8 % — ABNORMAL LOW (ref 36.0–46.0)
Hemoglobin: 11.9 g/dL — ABNORMAL LOW (ref 12.0–15.0)
Immature Granulocytes: 1 %
Lymphocytes Relative: 12 %
Lymphs Abs: 1.1 10*3/uL (ref 0.7–4.0)
MCH: 32.8 pg (ref 26.0–34.0)
MCHC: 33.2 g/dL (ref 30.0–36.0)
MCV: 98.6 fL (ref 80.0–100.0)
Monocytes Absolute: 0.7 10*3/uL (ref 0.1–1.0)
Monocytes Relative: 8 %
Neutro Abs: 6.6 10*3/uL (ref 1.7–7.7)
Neutrophils Relative %: 76 %
Platelets: 316 10*3/uL (ref 150–400)
RBC: 3.63 MIL/uL — ABNORMAL LOW (ref 3.87–5.11)
RDW: 13.3 % (ref 11.5–15.5)
WBC: 8.6 10*3/uL (ref 4.0–10.5)
nRBC: 0 % (ref 0.0–0.2)

## 2020-03-08 LAB — LIPASE, BLOOD: Lipase: 40 U/L (ref 11–51)

## 2020-03-08 LAB — SARS CORONAVIRUS 2 BY RT PCR (HOSPITAL ORDER, PERFORMED IN ~~LOC~~ HOSPITAL LAB): SARS Coronavirus 2: NEGATIVE

## 2020-03-08 MED ORDER — MORPHINE SULFATE (PF) 2 MG/ML IV SOLN
1.0000 mg | INTRAVENOUS | Status: DC | PRN
  Administered 2020-03-08 – 2020-03-13 (×10): 1 mg via INTRAVENOUS
  Filled 2020-03-08 (×10): qty 1

## 2020-03-08 MED ORDER — IOHEXOL 300 MG/ML  SOLN
100.0000 mL | Freq: Once | INTRAMUSCULAR | Status: AC | PRN
  Administered 2020-03-08: 100 mL via INTRAVENOUS

## 2020-03-08 MED ORDER — SENNA 8.6 MG PO TABS
1.0000 | ORAL_TABLET | Freq: Two times a day (BID) | ORAL | Status: DC
  Administered 2020-03-08 – 2020-03-15 (×13): 8.6 mg via ORAL
  Filled 2020-03-08 (×13): qty 1

## 2020-03-08 MED ORDER — LISINOPRIL 20 MG PO TABS: 20.0000 mg | ORAL_TABLET | Freq: Every day | ORAL | Status: DC

## 2020-03-08 MED ORDER — ACETAMINOPHEN 650 MG RE SUPP: 650.0000 mg | Freq: Four times a day (QID) | RECTAL | Status: DC | PRN

## 2020-03-08 MED ORDER — HEPARIN SODIUM (PORCINE) 5000 UNIT/ML IJ SOLN
5000.0000 [IU] | Freq: Three times a day (TID) | INTRAMUSCULAR | Status: DC
  Administered 2020-03-08 – 2020-03-15 (×20): 5000 [IU] via SUBCUTANEOUS
  Filled 2020-03-08 (×21): qty 1

## 2020-03-08 MED ORDER — FENTANYL CITRATE (PF) 100 MCG/2ML IJ SOLN
50.0000 ug | Freq: Once | INTRAMUSCULAR | Status: AC
  Administered 2020-03-08: 50 ug via INTRAVENOUS
  Filled 2020-03-08: qty 2

## 2020-03-08 MED ORDER — ONDANSETRON HCL 4 MG/2ML IJ SOLN
4.0000 mg | Freq: Four times a day (QID) | INTRAMUSCULAR | Status: DC | PRN
  Administered 2020-03-13: 4 mg via INTRAVENOUS
  Filled 2020-03-08: qty 2

## 2020-03-08 MED ORDER — ATORVASTATIN CALCIUM 10 MG PO TABS
20.0000 mg | ORAL_TABLET | Freq: Every day | ORAL | Status: DC
  Administered 2020-03-08 – 2020-03-14 (×7): 20 mg via ORAL
  Filled 2020-03-08 (×7): qty 2

## 2020-03-08 MED ORDER — PANTOPRAZOLE SODIUM 40 MG PO TBEC
40.0000 mg | DELAYED_RELEASE_TABLET | Freq: Every day | ORAL | Status: DC
  Administered 2020-03-09 – 2020-03-15 (×7): 40 mg via ORAL
  Filled 2020-03-08 (×7): qty 1

## 2020-03-08 MED ORDER — ACETAMINOPHEN 500 MG PO TABS
1000.0000 mg | ORAL_TABLET | Freq: Four times a day (QID) | ORAL | Status: DC | PRN
  Administered 2020-03-09 – 2020-03-15 (×12): 1000 mg via ORAL
  Filled 2020-03-08 (×12): qty 2

## 2020-03-08 MED ORDER — ONDANSETRON HCL 4 MG PO TABS: 4.0000 mg | ORAL_TABLET | Freq: Four times a day (QID) | ORAL | Status: DC | PRN

## 2020-03-08 MED ORDER — OXYCODONE HCL 5 MG PO TABS
5.0000 mg | ORAL_TABLET | Freq: Four times a day (QID) | ORAL | Status: DC | PRN
  Administered 2020-03-08 – 2020-03-14 (×14): 5 mg via ORAL
  Filled 2020-03-08 (×15): qty 1

## 2020-03-08 MED ORDER — PAROXETINE HCL 10 MG PO TABS
10.0000 mg | ORAL_TABLET | Freq: Every day | ORAL | Status: DC
  Administered 2020-03-08 – 2020-03-14 (×7): 10 mg via ORAL
  Filled 2020-03-08 (×8): qty 1

## 2020-03-08 MED ORDER — LEVOTHYROXINE SODIUM 25 MCG PO TABS
25.0000 ug | ORAL_TABLET | Freq: Every day | ORAL | Status: DC
  Administered 2020-03-09 – 2020-03-15 (×7): 25 ug via ORAL
  Filled 2020-03-08 (×7): qty 1

## 2020-03-08 NOTE — ED Triage Notes (Signed)
Pt bib ems, reporting that the pt fell last Monday outside and was in the yard for several hrs before vbeing discovered. brusing on the lower back and right flank. Today she is complaing of abdominal pain that is radiating to the back.   150/74 84pulse 94%ra

## 2020-03-08 NOTE — H&P (Signed)
History and Physical    [name] [id number] DOB: [birth year] DOA: 03/08/2020  PCP: Myrlene Broker, MD  Patient coming from: Home  I have personally briefly reviewed patient's old medical records in [place]  Chief Complaint: Abdominal and back pain  HPI: [name] is a 86 y.o. female with medical history significant for type 2 diabetes, hypertension, hyperlipidemia, hypothyroidism, GERD, anxiety/depression, and back pain who presents to the ED for evaluation of abdominal and back pain.  Patient states she fell about 1 week ago.  She says she was outside her home try to get water from the faucet.  She was bent over and when she stood up she became lightheaded and fell down onto the cement/brick walkway.  She began to have lower back pain and was unable to get up on her own power.  She tried calling out to her husband but nobody was near enough to hear her.  She says she was down for about 2 hours until she was able to crawl to an area where a neighbor was passing by to help her.  She was taken to [facility] ED for further evaluation.  She was given fluids for dehydration and hydrocodone to help with pain control.  Since then patient has been having continued lower back and right flank pain which radiates into her abdomen.  She has been having difficulty ambulating even with the use of a walker and has significant trouble transferring into and out of bed.  She has had poor oral intake in the last few days and difficulty with her ADLs.  She denies any recent chest pain, dyspnea, fevers, chills, diaphoresis, or diarrhea.  She says she has had some recent constipation and used a suppository to find some relief.  She currently lives at home with her husband.  Daughter states that family lives close by and neighbors also check in on her.  ED Course:  Initial vitals showed BP 176/82, pulse 85, RR 15, temp 98.7 Fahrenheit, SPO2 93% on room air.  Labs notable for WBC 8.6,  hemoglobin 11.9, platelets 316,000, sodium 132, potassium 4.2, bicarb 24, BUN 22, creatinine 0.71, LFTs within normal limits, lipase 40, urinalysis negative for UTI.  CT lumbar spine showed augmented fracture and inferior T12 and inferior T11 fracture.  Bilateral pedicle fractures at the level of T12 are more conspicuous when compared to prior study.  CT chest/abdomen/pelvis with contrast from same day prior to ED arrival also showed likely acute fractures involving T11 and T12 without other acute posttraumatic deformity seen.  Small bilateral pleural effusions with compressive atelectasis seen in both lung bases.  Cholecystectomy with mild biliary duct dilatation was seen.  Sclerotic lesion within the right ischium is seen and felt most likely a bone island.  Right apical pulmonary nodule a 4 mm also seen.  Patient was given IV fentanyl for pain control.  She was unable to ambulate safely and therefore the hospitalist service was consulted admit for further evaluation management.  Review of Systems: All systems reviewed and are negative except as documented in history of present illness above.   Past Medical History:  Diagnosis Date  . Anemia   . Anxiety   . Arthritis    "all over" (04/23/2017)  . Cataract 2015   bilateral cat. extr.  . Depression   . GERD (gastroesophageal reflux disease)   . Heart murmur   . High cholesterol   . History of pelvic fracture 03/2016  . Hypertension   .  Macular degeneration of both eyes   . Migraine    "started when I was in the 9th grade; lasted a couple years; started back in my 60's; had one daily right before OR" (04/23/2017)  . Osteoporosis   . Type II diabetes mellitus ([place])    "took Metformin for awhile; made me sick; diabetes is diet controlled" (04/23/2017)    Past Surgical History:  Procedure Laterality Date  . ABDOMINAL HYSTERECTOMY    . APPENDECTOMY    . BREAST CYST EXCISION Bilateral    "all benign"  . CARPAL TUNNEL RELEASE Right   .  CATARACT EXTRACTION W/ INTRAOCULAR LENS  IMPLANT, BILATERAL Bilateral   . DILATION AND CURETTAGE OF UTERUS    . JOINT REPLACEMENT    . KYPHOPLASTY    . LAPAROSCOPIC CHOLECYSTECTOMY    . TONSILLECTOMY AND ADENOIDECTOMY     as child  . TOTAL KNEE ARTHROPLASTY Right 04/22/2017  . TOTAL KNEE ARTHROPLASTY Right 04/22/2017   Procedure: TOTAL KNEE ARTHROPLASTY;  Surgeon: Vickey Huger, MD;  Location: [place];  Service: Orthopedics;  Laterality: Right;    Social History:  reports that she has never smoked. She has never used smokeless tobacco. She reports previous alcohol use. She reports that she does not use drugs.  Allergies  Allergen Reactions  . Sulfamethoxazole Diarrhea and Other (See Comments)    GI UPSET    Family History  Problem Relation Age of Onset  . Colon cancer Father   . Diabetes Father   . Pancreatic cancer Father   . Colon cancer Sister      Prior to Admission medications   Medication Sig Start Date End Date Taking? Authorizing Provider  acetaminophen (TYLENOL) 650 MG CR tablet Take 650 mg by mouth every 8 (eight) hours as needed for pain.   Yes [provider]  atorvastatin (LIPITOR) 20 MG tablet Take 20 mg by mouth at bedtime. 01/23/17  Yes [provider]  CALCIUM CITRATE PO Take 1 tablet by mouth daily.   Yes [provider]  COD LIVER OIL PO Take 1 tablet by mouth daily.    Yes [provider]  Ferrous Gluconate (IRON 27 PO) Take 1 tablet by mouth at bedtime.    Yes [provider]  Glucosamine-Chondroitin (COSAMIN DS PO) Take 1 tablet by mouth 2 (two) times daily. 1000mg  daily   Yes [provider]  lisinopril (PRINIVIL,ZESTRIL) 20 MG tablet Take 20 mg by mouth daily. 01/23/17  Yes [provider]  Magnesium 250 MG TABS Take 250 mg by mouth at bedtime.   Yes [provider]  meloxicam (MOBIC) 7.5 MG tablet Take 7.5 mg by mouth daily.   Yes [provider]  Multiple Vitamins-Minerals  (ICAPS PO) Take 1 tablet by mouth 2 (two) times daily.   Yes [provider]  omeprazole (PRILOSEC) 40 MG capsule Take 40 mg by mouth daily.  01/23/17  Yes [provider]  ondansetron (ZOFRAN) 4 MG tablet Take 4 mg by mouth every 8 (eight) hours as needed for nausea/vomiting. 03/02/20  Yes [provider]  PARoxetine (PAXIL) 10 MG tablet Take 10 mg by mouth at bedtime.    Yes [provider]  PERCOCET 5-325 MG tablet Take 1 tablet by mouth every 6 (six) hours as needed for pain. 03/05/20  Yes [provider]  Polyethyl Glycol-Propyl Glycol (SYSTANE) 0.4-0.3 % SOLN Place 1 drop into both eyes 2 (two) times daily as needed (for tired/dry eyes.).   Yes [provider]  psyllium (METAMUCIL) 58.6 % packet Take 1 packet by mouth daily at 12 noon.   Yes [provider]  traMADol (ULTRAM) 50 MG tablet Take 50 mg by mouth every 6 (six) hours as needed for pain. 03/02/20   [provider]    Physical Exam: Vitals:   03/08/20 1330 03/08/20 1350 03/08/20 1358 03/08/20 1400  BP: (!) 184/87 (!) 197/99  (!) 176/82  Pulse: 85 88  86  Resp:   12 15  Temp:      TempSrc:      SpO2: 92% 95%  93%   Constitutional: Elderly woman resting supine in bed, NAD, calm, comfortable Eyes: PERRL, lids and conjunctivae normal ENMT: Mucous membranes are dry. Posterior pharynx clear of any exudate or lesions.Normal dentition.  Neck: normal, supple, no masses. Respiratory: clear to auscultation anteriorly. Normal respiratory effort. No accessory muscle use.  Cardiovascular: Regular rate and rhythm, no murmurs / rubs / gallops. No extremity edema. 2+ pedal pulses. Abdomen: Mild generalized tenderness, no masses palpated. No hepatosplenomegaly. Bowel sounds positive.  Musculoskeletal: no clubbing / cyanosis. No joint deformity upper and lower extremities.  ROM limited at lower back due to pain, patient able to lift bilateral lower extremities off bed.  ROM of  upper extremities intact. skin: no rashes, lesions, ulcers. No induration Neurologic: CN 2-12 grossly intact. Sensation intact, Strength 5/5 in all 4 extremities however has difficulty sitting up and turning to side due to thoracic back pain.  Psychiatric: Normal judgment and insight. Alert and oriented x 3. Normal mood.   Labs on Admission: I have personally reviewed following labs and imaging studies  CBC: Recent Labs  Lab 03/08/20 1336  WBC 8.6  NEUTROABS 6.6  HGB 11.9*  HCT 35.8*  MCV 98.6  PLT 123XX123   Basic Metabolic Panel: Recent Labs  Lab 03/08/20 1336  NA 132*  K 4.2  CL 94*  CO2 24  GLUCOSE 122*  BUN 22  CREATININE 0.71  CALCIUM 9.0   GFR: CrCl cannot be calculated (Unknown ideal weight.). Liver Function Tests: Recent Labs  Lab 03/08/20 1336  AST 31  ALT 27  ALKPHOS 79  BILITOT 1.1  PROT 6.5  ALBUMIN 3.3*   Recent Labs  Lab 03/08/20 1336  LIPASE 40   No results for input(s): AMMONIA in the last 168 hours. Coagulation Profile: No results for input(s): INR, PROTIME in the last 168 hours. Cardiac Enzymes: No results for input(s): CKTOTAL, CKMB, CKMBINDEX, TROPONINI in the last 168 hours. BNP (last 3 results) No results for input(s): PROBNP in the last 8760 hours. HbA1C: No results for input(s): HGBA1C in the last 72 hours. CBG: No results for input(s): GLUCAP in the last 168 hours. Lipid Profile: No results for input(s): CHOL, HDL, LDLCALC, TRIG, CHOLHDL, LDLDIRECT in the last 72 hours. Thyroid Function Tests: No results for input(s): TSH, T4TOTAL, FREET4, T3FREE, THYROIDAB in the last 72 hours. Anemia Panel: No results for input(s): VITAMINB12, FOLATE, FERRITIN, TIBC, IRON, RETICCTPCT in the last 72 hours. Urine analysis:    Component Value Date/Time   COLORURINE YELLOW 03/08/2020 [place] 03/08/2020 1355   LABSPEC 1.010 03/08/2020 1355   PHURINE 7.0 03/08/2020 1355   GLUCOSEU NEGATIVE 03/08/2020 1355   HGBUR NEGATIVE  03/08/2020 1355   BILIRUBINUR NEGATIVE 03/08/2020 1355   KETONESUR NEGATIVE 03/08/2020 1355   PROTEINUR 30 (A) 03/08/2020 1355   NITRITE NEGATIVE 03/08/2020 1355   LEUKOCYTESUR NEGATIVE 03/08/2020 1355    Radiological Exams on Admission:  DG Ribs Unilateral W/Chest Right  Result Date: 03/08/2020 CLINICAL DATA:  [place] 1 week ago with persistent right-sided pain. EXAM: RIGHT RIBS AND CHEST - 3+ VIEW COMPARISON:  02/29/2020 FINDINGS: Rib films do not show any visible rib fracture. There does appear to be mild bibasilar atelectasis. No pneumothorax. IMPRESSION: No rib injury seen. The patient does appear to have mild bibasilar atelectasis. This raises concern about the possibility of an occult/nondisplaced fracture. Electronically Signed   By: Nelson Chimes M.D.   On: 03/08/2020 15:14   CT L-SPINE NO CHARGE  Result Date: 03/08/2020 CLINICAL DATA:  [place] a week ago with persistent back pain. EXAM: CT LUMBAR SPINE WITHOUT CONTRAST TECHNIQUE: Multidetector CT imaging of the lumbar spine was performed without intravenous contrast administration. Multiplanar CT image reconstructions were also generated. COMPARISON:  02/29/2020 FINDINGS: Segmentation: 5 lumbar type vertebral bodies. Alignment: Normal except for mild curvature. 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 No lumbar region fracture. Old healed sacral fracture at the S2 level. Previously augmented fracture at T12. We do not see above the inferior aspect on this lumbar examination. Referring to the whole body CT, there appears to be pedicle fractures present. I think these were visible on the previous study but were not nearly as conspicuous. There is also some loss of height at the inferior T11 vertebral body that could be active. Paraspinal and other soft tissues: See results of abdominal CT. Disc levels: Moderate stenosis at the L2-3 level due to endplate osteophytes, bulging of the disc and facet and ligamentous hypertrophy. Mild  lateral recess stenosis the L4-5 level because of similar findings. IMPRESSION: No acute fracture in the lumbar region. Previously augmented fracture at inferior T12. No evidence of progressive loss of height at that level. Patient does have bilateral pedicle fractures at this level which are more conspicuous than on the study of 02/29/2020. This could have been exacerbated by the recent fall. Inferior T11 fracture which is probably recent. This was present at least to a degree on 02/29/2020 but appears to have worsened. These thoracic findings are taken from the larger CT of the chest and abdomen done today rather than the reconstructed images ordered in the lumbar spine. Electronically Signed   By: Nelson Chimes M.D.   On: 03/08/2020 16:22    EKG: Independently reviewed. Normal sinus rhythm without acute ischemic changes.  Not significantly changed when compared to previous from 2018.  Assessment/Plan Principal Problem:   Closed T12 fracture (HCC) Active Problems:   Type II diabetes mellitus (HCC)   Depression   Hyperlipidemia associated with type 2 diabetes mellitus (HCC)   Hypothyroidism   Hypertension associated with diabetes ([place])  [name] is a 86 y.o. female with medical history significant for type 2 diabetes, hypertension, hyperlipidemia, hypothyroidism, GERD, anxiety/depression, and back pain who is admitted with T11 and T12 fractures after recent fall with uncontrolled pain and difficulty ambulating.  Mechanical fall at home with T11 and T12 fractures: She is having uncontrolled pain and is currently unsafe to ambulate on her own or with assistive device.  She remains a high risk for recurrent falls at this time. -Request PT/OT eval -Analgesics as needed with hold parameters -Fall precautions -Start bowel regimen  Hypertension: BP slightly elevated in setting of pain.  Resume home lisinopril.  Type 2 diabetes:  Diet controlled.  Continue to  monitor.  Hypothyroidism: Continue levothyroxine.  Hyperlipidemia: Continue atorvastatin.  Depression: Continue paroxetine.  DVT prophylaxis: Subcutaneous heparin Code Status: DNR, confirmed with  patient Family Communication: Discussed with patient's daughter at bedside Disposition Plan: From home, discharge pending PT/OT eval Consults called: None Admission status:  Status is: Observation  The patient remains OBS appropriate and will d/c before 2 midnights.  Dispo: The patient is from: Home              Anticipated d/c is to: TBD pending PT/OT eval              Anticipated d/c date is: 1 day              Patient currently is not medically stable to d/c.   Zada Finders MD [facility]  If 7PM-7AM, please contact night-coverage [email or web address]  03/08/2020, 6:50 PM

## 2020-03-08 NOTE — ED Provider Notes (Signed)
[facility] EMERGENCY DEPARTMENT Provider Note   CSN: [id number] Arrival date & time: 03/08/20  1316     History Chief Complaint  Patient presents with  . Abdominal Pain    [name] is a 86 y.o. female.  86 y.o female with an since a past medical history including arthritis, anemia, diabetes type 2 presents to the ED with a chief complaint of low back pain along with abdominal pain x1 week.  According to patient who is able to provide limited history, she had a fall about a week ago, was seen at [facility].  Now reports significant pain to the right side of her flank with radiation into her abdomen.  According to daughter at the bedside, since fall patient has had worsening pain, has been unable to be comfortable.  Reports she is had decreased function such as ambulating along with getting up from bed.  States she is taking hydrocodone 5 mg to rest but reports pain has not been improved.  Does endorse that she tells her the pain is more so from her back to her abdomen and from her abdomen onto her back.  Seems to be exacerbated with any sort of movement.  Cording to daughter, imaging such as x-rays were performed at [facility], unsure whether there was any CT or MRI.  She has had multiple falls in the past couple of months however this last fall a week ago was the most severe.  She reports patient has had some decline in her intake.  Reports her blood pressure has been significantly elevated at home, reports that this is likely due to her pain with a systolic in the [id number].  No urinary symptoms, last bowel movement was yesterday without any blood in her stool.  The history is provided by the patient and a relative.       Past Medical History:  Diagnosis Date  . Anemia   . Anxiety   . Arthritis    "all over" (04/23/2017)  . Cataract 2015   bilateral cat. extr.  . Depression   . GERD (gastroesophageal reflux disease)   . Heart murmur   . High cholesterol    . History of pelvic fracture 03/2016  . Hypertension   . Macular degeneration of both eyes   . Migraine    "started when I was in the 9th grade; lasted a couple years; started back in my 50's; had one daily right before OR" (04/23/2017)  . Osteoporosis   . Type II diabetes mellitus ([place])    "took Metformin for awhile; made me sick; diabetes is diet controlled" (04/23/2017)    Patient Active Problem List   Diagnosis Date Noted  . Closed T12 fracture ([place]) 03/08/2020  . Type II diabetes mellitus ([place])   . Depression   . Hyperlipidemia associated with type 2 diabetes mellitus ([place])   . Hypothyroidism   . Hypertension associated with diabetes ([place])   . Screening for viral disease 08/13/2019  . Heme positive stool 08/13/2019  . Anemia 08/13/2019  . Melena 08/13/2019  . S/P total knee replacement 04/22/2017    Past Surgical History:  Procedure Laterality Date  . ABDOMINAL HYSTERECTOMY    . APPENDECTOMY    . BREAST CYST EXCISION Bilateral    "all benign"  . CARPAL TUNNEL RELEASE Right   . CATARACT EXTRACTION W/ INTRAOCULAR LENS  IMPLANT, BILATERAL Bilateral   . DILATION AND CURETTAGE OF UTERUS    . JOINT REPLACEMENT    .  KYPHOPLASTY    . LAPAROSCOPIC CHOLECYSTECTOMY    . TONSILLECTOMY AND ADENOIDECTOMY     as child  . TOTAL KNEE ARTHROPLASTY Right 04/22/2017  . TOTAL KNEE ARTHROPLASTY Right 04/22/2017   Procedure: TOTAL KNEE ARTHROPLASTY;  Surgeon: Vickey Huger, MD;  Location: [place];  Service: Orthopedics;  Laterality: Right;     OB History   No obstetric history on file.     Family History  Problem Relation Age of Onset  . Colon cancer Father   . Diabetes Father   . Pancreatic cancer Father   . Colon cancer Sister     Social History   Tobacco Use  . Smoking status: Never Smoker  . Smokeless tobacco: Never Used  Substance Use Topics  . Alcohol use: Not Currently    Comment: 04/23/2017 "glass of wine q couple months; if that"  . Drug use: No    Home  Medications Prior to Admission medications   Medication Sig Start Date End Date Taking? Authorizing Provider  acetaminophen (TYLENOL) 650 MG CR tablet Take 650 mg by mouth every 8 (eight) hours as needed for pain.   Yes [provider]  atorvastatin (LIPITOR) 20 MG tablet Take 20 mg by mouth at bedtime. 01/23/17  Yes [provider]  CALCIUM CITRATE PO Take 1 tablet by mouth daily.   Yes [provider]  COD LIVER OIL PO Take 1 tablet by mouth daily.    Yes [provider]  Ferrous Gluconate (IRON 27 PO) Take 1 tablet by mouth at bedtime.    Yes [provider]  Glucosamine-Chondroitin (COSAMIN DS PO) Take 1 tablet by mouth 2 (two) times daily. 1000mg  daily   Yes [provider]  lisinopril (PRINIVIL,ZESTRIL) 20 MG tablet Take 20 mg by mouth daily. 01/23/17  Yes [provider]  Magnesium 250 MG TABS Take 250 mg by mouth at bedtime.   Yes [provider]  meloxicam (MOBIC) 7.5 MG tablet Take 7.5 mg by mouth daily.   Yes [provider]  Multiple Vitamins-Minerals (ICAPS PO) Take 1 tablet by mouth 2 (two) times daily.   Yes [provider]  omeprazole (PRILOSEC) 40 MG capsule Take 40 mg by mouth daily.  01/23/17  Yes [provider]  ondansetron (ZOFRAN) 4 MG tablet Take 4 mg by mouth every 8 (eight) hours as needed for nausea/vomiting. 03/02/20  Yes [provider]  PARoxetine (PAXIL) 10 MG tablet Take 10 mg by mouth at bedtime.    Yes [provider]  PERCOCET 5-325 MG tablet Take 1 tablet by mouth every 6 (six) hours as needed for pain. 03/05/20  Yes [provider]  Polyethyl Glycol-Propyl Glycol (SYSTANE) 0.4-0.3 % SOLN Place 1 drop into both eyes 2 (two) times daily as needed (for tired/dry eyes.).   Yes [provider]  psyllium (METAMUCIL) 58.6 % packet Take 1 packet by mouth daily at 12 noon.   Yes [provider]  traMADol (ULTRAM) 50 MG tablet Take 50  mg by mouth every 6 (six) hours as needed for pain. 03/02/20   [provider]    Allergies    Sulfamethoxazole  Review of Systems   Review of Systems  Constitutional: Negative for chills and fever.  HENT: Negative for ear pain and sore throat.   Eyes: Negative for pain and visual disturbance.  Respiratory: Negative for cough and shortness of breath.   Cardiovascular: Negative for chest pain and palpitations.  Gastrointestinal: Negative for abdominal pain and vomiting.  Genitourinary: Positive for flank pain. Negative for dysuria and hematuria.  Musculoskeletal: Positive for myalgias. Negative for arthralgias and back pain.  Skin: Negative for color change and rash.  Neurological: Negative for seizures and syncope.  All other systems reviewed and are negative.   Physical Exam Updated Vital Signs BP (!) 176/82   Pulse 86   Temp 98.7 F (37.1 C) (Oral)   Resp 15   SpO2 93%   Physical Exam Vitals and nursing note reviewed.  Constitutional:      Appearance: She is ill-appearing.  HENT:     Head: Normocephalic and atraumatic.  Eyes:     Extraocular Movements: Extraocular movements intact.     Pupils: Pupils are equal, round, and reactive to light.  Pulmonary:     Effort: Pulmonary effort is normal.     Breath sounds: No wheezing or rales.  Abdominal:     General: Abdomen is flat. Bowel sounds are normal. There is distension.     Palpations: Abdomen is soft.     Tenderness: There is generalized abdominal tenderness and tenderness in the right upper quadrant. There is no guarding.     Hernia: No hernia is present.  Skin:    General: Skin is warm and dry.  Neurological:     Mental Status: She is alert.     Comments: Alert, oriented, thought content appropriate. Speech fluent without evidence of aphasia. Able to follow 2 step commands without difficulty.  Cranial Nerves:  II:  Peripheral visual fields grossly normal, pupils, round, reactive to light III,IV, VI:  ptosis not present, extra-ocular motions intact bilaterally  V,VII: smile symmetric, facial light touch sensation equal VIII: hearing grossly normal bilaterally  IX,X: midline uvula rise  XI: bilateral shoulder shrug equal and strong XII: midline tongue extension  Motor:  5/5 in upper and lower extremities bilaterally including strong and equal grip strength and dorsiflexion/plantar flexion Sensory: light touch normal in all extremities.  Cerebellar: normal finger-to-nose with bilateral upper extremities, pronator drift negative       ED Results / Procedures / Treatments   Labs (all labs ordered are listed, but only abnormal results are displayed) Labs Reviewed  CBC WITH DIFFERENTIAL/PLATELET - Abnormal; Notable for the following components:      Result Value   RBC 3.63 (*)    Hemoglobin 11.9 (*)    HCT 35.8 (*)    All other components within normal limits  COMPREHENSIVE METABOLIC PANEL - Abnormal; Notable for the following components:   Sodium 132 (*)    Chloride 94 (*)    Glucose, Bld 122 (*)    Albumin 3.3 (*)    All other components within normal limits  URINALYSIS, ROUTINE W REFLEX MICROSCOPIC - Abnormal; Notable for the following components:   Protein, ur 30 (*)    All other components within normal limits  URINE CULTURE  SARS CORONAVIRUS 2 BY RT PCR [facility] ORDER, [facility])  LIPASE, BLOOD    EKG EKG Interpretation  Date/Time:  Tuesday March 08 2020 13:58:43 EDT Ventricular Rate:  83 PR Interval:    QRS Duration: 81 QT Interval:  355 QTC Calculation: 418 R Axis:   5 Text Interpretation: Sinus rhythm No significant change since last tracing Confirmed by Blanchie Dessert [id number]) on 03/08/2020 2:29:54 PM   Radiology DG Ribs Unilateral W/Chest Right  Result Date: 03/08/2020 CLINICAL DATA:  [place] 1 week ago with persistent right-sided pain. EXAM: RIGHT RIBS AND CHEST - 3+ VIEW COMPARISON:  02/29/2020 FINDINGS: Rib films do not show  any visible rib fracture. There does appear to be mild bibasilar atelectasis. No pneumothorax. IMPRESSION: No rib injury seen. The patient does appear to have mild bibasilar atelectasis. This raises concern about the possibility of an occult/nondisplaced fracture. Electronically Signed   By: Nelson Chimes M.D.   On: 03/08/2020 15:14   CT L-SPINE NO CHARGE  Result Date: 03/08/2020 CLINICAL DATA:  [place] a week ago with persistent back pain. EXAM: CT LUMBAR SPINE WITHOUT CONTRAST TECHNIQUE: Multidetector CT imaging of the lumbar spine was performed without intravenous contrast administration. Multiplanar CT image reconstructions were also generated. COMPARISON:  02/29/2020 FINDINGS: Segmentation: 5 lumbar type vertebral bodies. Alignment: Normal except for mild curvature. 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 No lumbar region fracture. Old healed sacral fracture at the S2 level. Previously augmented fracture at T12. We do not see above the inferior aspect on this lumbar examination. Referring to the whole body CT, there appears to be pedicle fractures present. I think these were visible on the previous study but were not nearly as conspicuous. There is also some loss of height at the inferior T11 vertebral body that could be active. Paraspinal and other soft tissues: See results of abdominal CT. Disc levels: Moderate stenosis at the L2-3 level due to endplate osteophytes, bulging of the disc and facet and ligamentous hypertrophy. Mild lateral recess stenosis the L4-5 level because of similar findings. IMPRESSION: No acute fracture in the lumbar region. Previously augmented fracture at inferior T12. No evidence of progressive loss of height at that level. Patient does have bilateral pedicle fractures at this level which are more conspicuous than on the study of 02/29/2020. This could have been exacerbated by the recent fall. Inferior T11 fracture which is probably recent. This was present at least  to a degree on 02/29/2020 but appears to have worsened. These thoracic findings are taken from the larger CT of the chest and abdomen done today rather than the reconstructed images ordered in the lumbar spine. Electronically Signed   By: Nelson Chimes M.D.   On: 03/08/2020 16:22    Procedures Procedures (including critical care time)  Medications Ordered in ED Medications  fentaNYL (SUBLIMAZE) injection 50 mcg (50 mcg Intravenous Given 03/08/20 1419)  iohexol (OMNIPAQUE) 300 MG/ML solution 100 mL (100 mLs Intravenous Contrast Given 03/08/20 1602)  fentaNYL (SUBLIMAZE) injection 50 mcg (50 mcg Intravenous Given 03/08/20 1711)    ED Course  I have reviewed the triage vital signs and the nursing notes.  Pertinent labs & imaging results that were available during my care of the patient were reviewed by me and considered in my medical decision making (see chart for details).    MDM Rules/Calculators/A&P    Patient presents via EMS for complaints of abdominal pain which began a week ago.  According to EMS, patient had a fall, was seen at [facility], was discharged home with hydrocodone 5 mg without improvement in her symptoms.  Collateral information obtained from daughter who reports patient has been unable to perform her ADLs at home, has had decreased intake, has reported severe abdominal pain although she does have bowel movements at home.  Prior to fall patient was a spry 86 year old who currently lives with her 39th-year-old husband.   During evaluation, patient appears to be uncomfortable, there is significant pain with palpation of the right ribs along with her right thoracic spine.  She is able to move all extremities fully although slowly.  No  chest pain, shortness of breath, abdomen is somewhat distended bowel sounds are present and normal.  She is neurologically intact, although hard of hearing and slow to respond.  Additional labs reveal a CBC without any leukocytosis, slight decrease  in her hemoglobin but stable.  CMP without any electrolyte derangement, creatinine function is within normal limits.  LFTs are unremarkable, there is pain with palpation of the right upper quadrant, I feel that this is more so along the bony structures and thoracic spine.  Will obtain imaging to further evaluate condition.   Xray of her chest showed:  No rib injury seen. The patient does appear to have mild bibasilar  atelectasis. This raises concern about the possibility of an  occult/nondisplaced fracture.       CT Chest and Abdomen showed: 1. Status post T12 vertebral augmentation with complex, likely acute fractures involving T11 and T12. Consider thoracic spine MRI. 2. No other acute posttraumatic deformity identified. 3. Small bilateral pleural effusions with compressive atelectasis in both lung bases. 4. Cholecystectomy with mild biliary duct dilatation. Correlate with bilirubin levels. If normal, this is likely within normal variation for age. 5. Sclerotic lesion within the right ischium is most likely a bone island. Correlate with any history of primary malignancy. 6. Right apical pulmonary nodule of 4 mm. No follow-up needed if patient is low-risk. Non-contrast chest CT can be considered in 12 months if patient is high-risk. This recommendation follows the consensus statement: Guidelines for Management of Incidental   I have discussed case with my attending Dr. Maryan Rued who has evaluated the patient and agrees with plan and management.  Patient did receive a total of 100 mics of fentanyl for pain control.  6:46 PM Spoke to Dr. Posey Pronto hospitalist service who will admit patient for further management of her T11-T12 fractures.    Portions of this note were generated with [employer]. Dictation errors may occur despite best attempts at proofreading.  Final Clinical Impression(s) / ED Diagnoses Final diagnoses:  Fall  Closed fracture of multiple thoracic  vertebrae, initial encounter [facility])    Rx / [place] Orders ED Discharge Orders    None       Janeece Fitting, Hershal Coria 03/08/20 1857    Blanchie Dessert, MD 03/08/20 2152

## 2020-03-08 NOTE — Plan of Care (Signed)

## 2020-03-09 DIAGNOSIS — Z90710 Acquired absence of both cervix and uterus: Secondary | ICD-10-CM | POA: Diagnosis not present

## 2020-03-09 DIAGNOSIS — Z9049 Acquired absence of other specified parts of digestive tract: Secondary | ICD-10-CM | POA: Diagnosis not present

## 2020-03-09 DIAGNOSIS — I152 Hypertension secondary to endocrine disorders: Secondary | ICD-10-CM | POA: Diagnosis present

## 2020-03-09 DIAGNOSIS — Z961 Presence of intraocular lens: Secondary | ICD-10-CM | POA: Diagnosis present

## 2020-03-09 DIAGNOSIS — K5903 Drug induced constipation: Secondary | ICD-10-CM | POA: Diagnosis present

## 2020-03-09 DIAGNOSIS — T402X5A Adverse effect of other opioids, initial encounter: Secondary | ICD-10-CM | POA: Diagnosis present

## 2020-03-09 DIAGNOSIS — Y92098 Other place in other non-institutional residence as the place of occurrence of the external cause: Secondary | ICD-10-CM | POA: Diagnosis not present

## 2020-03-09 DIAGNOSIS — Z96651 Presence of right artificial knee joint: Secondary | ICD-10-CM | POA: Diagnosis present

## 2020-03-09 DIAGNOSIS — K219 Gastro-esophageal reflux disease without esophagitis: Secondary | ICD-10-CM | POA: Diagnosis present

## 2020-03-09 DIAGNOSIS — Z9841 Cataract extraction status, right eye: Secondary | ICD-10-CM | POA: Diagnosis not present

## 2020-03-09 DIAGNOSIS — E785 Hyperlipidemia, unspecified: Secondary | ICD-10-CM | POA: Diagnosis present

## 2020-03-09 DIAGNOSIS — E7800 Pure hypercholesterolemia, unspecified: Secondary | ICD-10-CM | POA: Diagnosis present

## 2020-03-09 DIAGNOSIS — F419 Anxiety disorder, unspecified: Secondary | ICD-10-CM | POA: Diagnosis present

## 2020-03-09 DIAGNOSIS — M549 Dorsalgia, unspecified: Secondary | ICD-10-CM | POA: Diagnosis present

## 2020-03-09 DIAGNOSIS — Z79890 Hormone replacement therapy (postmenopausal): Secondary | ICD-10-CM | POA: Diagnosis not present

## 2020-03-09 DIAGNOSIS — Z9842 Cataract extraction status, left eye: Secondary | ICD-10-CM | POA: Diagnosis not present

## 2020-03-09 DIAGNOSIS — Y92009 Unspecified place in unspecified non-institutional (private) residence as the place of occurrence of the external cause: Secondary | ICD-10-CM | POA: Diagnosis not present

## 2020-03-09 DIAGNOSIS — M810 Age-related osteoporosis without current pathological fracture: Secondary | ICD-10-CM | POA: Diagnosis present

## 2020-03-09 DIAGNOSIS — E039 Hypothyroidism, unspecified: Secondary | ICD-10-CM | POA: Diagnosis present

## 2020-03-09 DIAGNOSIS — Z20822 Contact with and (suspected) exposure to covid-19: Secondary | ICD-10-CM | POA: Diagnosis present

## 2020-03-09 DIAGNOSIS — Z882 Allergy status to sulfonamides status: Secondary | ICD-10-CM | POA: Diagnosis not present

## 2020-03-09 DIAGNOSIS — E871 Hypo-osmolality and hyponatremia: Secondary | ICD-10-CM | POA: Diagnosis present

## 2020-03-09 DIAGNOSIS — E1169 Type 2 diabetes mellitus with other specified complication: Secondary | ICD-10-CM | POA: Diagnosis present

## 2020-03-09 DIAGNOSIS — W1830XA Fall on same level, unspecified, initial encounter: Secondary | ICD-10-CM | POA: Diagnosis present

## 2020-03-09 DIAGNOSIS — Z66 Do not resuscitate: Secondary | ICD-10-CM | POA: Diagnosis present

## 2020-03-09 DIAGNOSIS — F329 Major depressive disorder, single episode, unspecified: Secondary | ICD-10-CM | POA: Diagnosis present

## 2020-03-09 DIAGNOSIS — S22089A Unspecified fracture of T11-T12 vertebra, initial encounter for closed fracture: Secondary | ICD-10-CM | POA: Diagnosis present

## 2020-03-09 DIAGNOSIS — R911 Solitary pulmonary nodule: Secondary | ICD-10-CM | POA: Diagnosis present

## 2020-03-09 LAB — CBC
HCT: 34.7 % — ABNORMAL LOW (ref 36.0–46.0)
Hemoglobin: 11.5 g/dL — ABNORMAL LOW (ref 12.0–15.0)
MCH: 32.3 pg (ref 26.0–34.0)
MCHC: 33.1 g/dL (ref 30.0–36.0)
MCV: 97.5 fL (ref 80.0–100.0)
Platelets: 327 10*3/uL (ref 150–400)
RBC: 3.56 MIL/uL — ABNORMAL LOW (ref 3.87–5.11)
RDW: 13.3 % (ref 11.5–15.5)
WBC: 9.2 10*3/uL (ref 4.0–10.5)
nRBC: 0 % (ref 0.0–0.2)

## 2020-03-09 LAB — BASIC METABOLIC PANEL
Anion gap: 11 (ref 5–15)
BUN: 18 mg/dL (ref 8–23)
CO2: 26 mmol/L (ref 22–32)
Calcium: 8.6 mg/dL — ABNORMAL LOW (ref 8.9–10.3)
Chloride: 96 mmol/L — ABNORMAL LOW (ref 98–111)
Creatinine, Ser: 0.72 mg/dL (ref 0.44–1.00)
GFR calc Af Amer: >60 mL/min (ref >60)
GFR calc non Af Amer: >60 mL/min (ref >60)
Glucose, Bld: 122 mg/dL — ABNORMAL HIGH (ref 70–99)
Potassium: 3.6 mmol/L (ref 3.5–5.1)
Sodium: 133 mmol/L — ABNORMAL LOW (ref 135–145)

## 2020-03-09 LAB — URINE CULTURE: Culture: NO GROWTH

## 2020-03-09 MED ORDER — METHOCARBAMOL 1000 MG/10ML IJ SOLN
500.0000 mg | Freq: Three times a day (TID) | INTRAVENOUS | Status: DC | PRN
  Filled 2020-03-09 (×2): qty 5

## 2020-03-09 MED ORDER — LISINOPRIL 20 MG PO TABS
40.0000 mg | ORAL_TABLET | Freq: Every day | ORAL | Status: DC
  Administered 2020-03-09 – 2020-03-15 (×7): 40 mg via ORAL
  Filled 2020-03-09 (×7): qty 2

## 2020-03-09 MED ORDER — SODIUM CHLORIDE 0.9 % IV SOLN: INTRAVENOUS | Status: DC

## 2020-03-09 NOTE — Evaluation (Signed)
Physical Therapy Evaluation Patient Details Name: [name] MRN: [id number] DOB: 1922/12/05 Today's Date: 03/09/2020   History of Present Illness  Pt is a 86 y/o female who presents s/p fall at home, down 2-3 hours. Pt sustained T11-T12 fractures and is being treated without surgical intervention at this time. PMH signifcant for DMII, osteoporosis, migraine, macular degeneration in both eyes, HTN, pelvic fracture 2017, heart murmur, R TKR, kyphoplasty.  Clinical Impression  Pt admitted with above diagnosis. At the time of PT eval pt was able to perform transfers and ambulation with gross min assist to min guard assist with RW for support. Pt with increased pain during OOB mobility and tolerance for functional activity is decreased. Daughter present and providing additional history details. It appears that this pt was fairly independent prior to falling, and she has had a significant decline in function. Pt and daughter are hopeful for SNF level rehab in preparation to return home with husband. Pt currently with functional limitations due to the deficits listed below (see PT Problem List). Acutely, pt will benefit from skilled PT to increase their independence and safety with mobility to allow discharge to the venue listed below.       Follow Up Recommendations SNF;Supervision/Assistance - 24 hour    Equipment Recommendations  None recommended by PT    Recommendations for Other Services       Precautions / Restrictions Precautions Precautions: Fall;Back Precaution Booklet Issued: Yes (comment) Precaution Comments: Back precautions for comfort Restrictions Weight Bearing Restrictions: No      Mobility  Bed Mobility Overal bed mobility: Needs Assistance Bed Mobility: Rolling;Sidelying to Sit Rolling: Min assist Sidelying to sit: Min assist;+2 for physical assistance       General bed mobility comments: Log roll for comfort. Assist required to get LE's off EOB, and to initiate  rolling and transition to sitting.   Transfers Overall transfer level: Needs assistance Equipment used: Rolling walker (2 wheeled) Transfers: Sit to/from Stand Sit to Stand: Min assist         General transfer comment: Assist for power-up to full standing position. Increased time required to achieve full upright posture.   Ambulation/Gait Ambulation/Gait assistance: Min guard Gait Distance (Feet): 12 Feet Assistive device: Rolling walker (2 wheeled) Gait Pattern/deviations: Step-through pattern;Decreased stride length;Shuffle;Trunk flexed;Narrow base of support Gait velocity: Decreased Gait velocity interpretation: <1.31 ft/sec, indicative of household ambulator General Gait Details: Ambulated in room only with min guard assist and RW for support. Pt reports feeling weak in her legs but no significant buckling noted.   Stairs            Wheelchair Mobility    Modified Rankin (Stroke Patients Only)       Balance Overall balance assessment: Needs assistance Sitting-balance support: Feet supported;No upper extremity supported Sitting balance-Leahy Scale: Fair     Standing balance support: Bilateral upper extremity supported;During functional activity Standing balance-Leahy Scale: Poor Standing balance comment: Reliant on UE support                             Pertinent Vitals/Pain Pain Assessment: Faces Faces Pain Scale: Hurts even more Pain Location: back/abdomen Pain Descriptors / Indicators: Grimacing;Guarding Pain Intervention(s): Limited activity within patient's tolerance;Monitored during session;Repositioned    Home Living Family/patient expects to be discharged to:: Private residence Living Arrangements: Spouse/significant other Available Help at Discharge: Family;Available 24 hours/day(Husband (89) 24 hours, daughter/aide intermittently) Type of Home: House Home Access: Stairs to  enter Entrance Stairs-Rails: Right Entrance Stairs-Number  of Steps: 2 Home Layout: One level Home Equipment: Clinical cytogeneticist - 2 wheels;Cane - single point      Prior Function Level of Independence: Independent;Independent with assistive device(s)         Comments: Occasional SPC use prior to fall     Hand Dominance   Dominant Hand: Right    Extremity/Trunk Assessment   Upper Extremity Assessment Upper Extremity Assessment: Defer to OT evaluation    Lower Extremity Assessment Lower Extremity Assessment: Generalized weakness    Cervical / Trunk Assessment Cervical / Trunk Assessment: Kyphotic  Communication   Communication: HOH(hearing aides)  Cognition Arousal/Alertness: Awake/alert Behavior During Therapy: WFL for tasks assessed/performed Overall Cognitive Status: Impaired/Different from baseline Area of Impairment: Memory;Safety/judgement;Awareness                     Memory: Decreased recall of precautions;Decreased short-term memory   Safety/Judgement: Decreased awareness of safety Awareness: Emergent   General Comments: Daughter present and indicated pt with some changes due to pain medication. Mild deficits noted      General Comments      Exercises     Assessment/Plan    PT Assessment Patient needs continued PT services  PT Problem List Decreased strength;Decreased activity tolerance;Decreased balance;Decreased mobility;Decreased knowledge of use of DME;Decreased safety awareness;Decreased knowledge of precautions;Pain       PT Treatment Interventions DME instruction;Gait training;Functional mobility training;Therapeutic activities;Therapeutic exercise;Neuromuscular re-education;Balance training;Patient/family education    PT Goals (Current goals can be found in the Care Plan section)  Acute Rehab PT Goals Patient Stated Goal: Decrease pain; get better. Daughter is hopeful for return home after rehab PT Goal Formulation: With patient/family Time For Goal Achievement: 03/16/20 Potential to  Achieve Goals: Good    Frequency Min 3X/week   Barriers to discharge Decreased caregiver support 24 hour support not available at this time    Co-evaluation PT/OT/SLP Co-Evaluation/Treatment: Yes Reason for Co-Treatment: For patient/therapist safety;To address functional/ADL transfers PT goals addressed during session: Mobility/safety with mobility;Balance;Proper use of DME         AM-PAC PT "6 Clicks" Mobility  Outcome Measure Help needed turning from your back to your side while in a flat bed without using bedrails?: A Little Help needed moving from lying on your back to sitting on the side of a flat bed without using bedrails?: A Little Help needed moving to and from a bed to a chair (including a wheelchair)?: A Little Help needed standing up from a chair using your arms (e.g., wheelchair or bedside chair)?: A Little Help needed to walk in hospital room?: A Little Help needed climbing 3-5 steps with a railing? : A Lot 6 Click Score: 17    End of Session Equipment Utilized During Treatment: Gait belt Activity Tolerance: Patient tolerated treatment well Patient left: in chair;with call bell/phone within reach;with family/visitor present;with chair alarm set Nurse Communication: Mobility status PT Visit Diagnosis: Unsteadiness on feet (R26.81);Pain Pain - part of body: (back)    Time: 1132-1202 PT Time Calculation (min) (ACUTE ONLY): 30 min   Charges:   PT Evaluation $PT Eval Moderate Complexity: 1 Mod          Rolinda Roan, PT, DPT Acute Rehabilitation Services Pager: [phone] Office: [phone]   Thelma Comp 03/09/2020, 12:47 PM

## 2020-03-09 NOTE — Evaluation (Signed)
Occupational Therapy Evaluation Patient Details Name: [name] MRN: [id number] DOB: January 26, 1923 Today's Date: 03/09/2020    History of Present Illness Pt is a 86 y/o female who presents s/p fall at home, down 2-3 hours. Pt sustained T11-T12 fractures and is being treated without surgical intervention at this time. PMH signifcant for DMII, osteoporosis, migraine, macular degeneration in both eyes, HTN, pelvic fracture 2017, heart murmur, R TKR, kyphoplasty.   Clinical Impression   PTA, pt was living at home with her husband who has cognitive limitations, pt was independent with ADL/IADL and functional mobility. She has a life alert at home but does not wear it consistently. Pt currently requires minA+2 for bed mobility, minA for functional mobility at RW level. She requires minA for LB dressing. Due to decline in current level of function, pt would benefit from acute OT to address established goals to facilitate safe D/C to venue listed below. At this time, recommend SNF follow-up. Will continue to follow acutely.     Follow Up Recommendations  SNF;Supervision/Assistance - 24 hour    Equipment Recommendations  Other (comment)(TBD at next venue)    Recommendations for Other Services       Precautions / Restrictions Precautions Precautions: Fall;Back Precaution Booklet Issued: Yes (comment) Precaution Comments: Back precautions for comfort Restrictions Weight Bearing Restrictions: No      Mobility Bed Mobility Overal bed mobility: Needs Assistance Bed Mobility: Rolling;Sidelying to Sit Rolling: Min assist Sidelying to sit: Min assist;+2 for physical assistance       General bed mobility comments: Log roll for comfort. Assist required to get LE's off EOB, and to initiate rolling and transition to sitting.   Transfers Overall transfer level: Needs assistance Equipment used: Rolling walker (2 wheeled) Transfers: Sit to/from Stand Sit to Stand: Min assist;+2  safety/equipment         General transfer comment: Assist for power-up to full standing position. Increased time required to achieve full upright posture.     Balance Overall balance assessment: Needs assistance Sitting-balance support: Feet supported;No upper extremity supported Sitting balance-Leahy Scale: Fair     Standing balance support: Bilateral upper extremity supported;During functional activity Standing balance-Leahy Scale: Poor Standing balance comment: Reliant on UE support                           ADL either performed or assessed with clinical judgement   ADL Overall ADL's : Needs assistance/impaired Eating/Feeding: Set up;Sitting   Grooming: Set up;Sitting   Upper Body Bathing: Min guard;Sitting   Lower Body Bathing: Minimal assistance;Sit to/from stand;+2 for safety/equipment   Upper Body Dressing : Min guard;Sitting   Lower Body Dressing: Minimal assistance;Sit to/from stand   Toilet Transfer: Minimal assistance;+2 for safety/equipment;RW;Ambulation Toilet Transfer Details (indicate cue type and reason): in room mobility with minA+2 Toileting- Clothing Manipulation and Hygiene: Minimal assistance;+2 for safety/equipment       Functional mobility during ADLs: Minimal assistance;+2 for safety/equipment;Rolling walker General ADL Comments: pt limited by pain, decreased activity tolerance, and cognition     Vision         Perception     Praxis      Pertinent Vitals/Pain Pain Assessment: Faces Faces Pain Scale: Hurts even more Pain Location: back/abdomen Pain Descriptors / Indicators: Grimacing;Guarding Pain Intervention(s): Limited activity within patient's tolerance;Monitored during session;Repositioned     Hand Dominance Right   Extremity/Trunk Assessment Upper Extremity Assessment Upper Extremity Assessment: Generalized weakness   Lower Extremity Assessment Lower  Extremity Assessment: Defer to PT evaluation   Cervical /  Trunk Assessment Cervical / Trunk Assessment: Kyphotic   Communication Communication Communication: HOH(hearing aides)   Cognition Arousal/Alertness: Awake/alert Behavior During Therapy: WFL for tasks assessed/performed Overall Cognitive Status: Impaired/Different from baseline Area of Impairment: Memory;Safety/judgement;Awareness                     Memory: Decreased recall of precautions;Decreased short-term memory   Safety/Judgement: Decreased awareness of safety Awareness: Emergent   General Comments: Daughter present and indicated pt with some changes due to pain medication. Mild deficits noted   General Comments  pt's daughter present during session    Exercises Exercises: Other exercises Other Exercises Other Exercises: provided and educated pt and daughter on back precautions   Shoulder Instructions      Home Living Family/patient expects to be discharged to:: Private residence Living Arrangements: Spouse/significant other Available Help at Discharge: Family;Available 24 hours/day(Husband (89) 24 hours, daughter/aide intermittently) Type of Home: House Home Access: Stairs to enter [employer] of Steps: 2 Entrance Stairs-Rails: Right Home Layout: One level     Bathroom Shower/Tub: Walk-in shower;Tub only   Bathroom Toilet: Standard Bathroom Accessibility: No   Home Equipment: Clinical cytogeneticist - 2 wheels;Cane - single point          Prior Functioning/Environment Level of Independence: Independent;Independent with assistive device(s)        Comments: Occasional SPC use prior to fall        OT Problem List: Impaired balance (sitting and/or standing);Decreased activity tolerance;Decreased knowledge of precautions;Decreased knowledge of use of DME or AE;Pain      OT Treatment/Interventions: Self-care/ADL training;Therapeutic exercise;Energy conservation;DME and/or AE instruction;Therapeutic activities;Patient/family  education;Balance training;Cognitive remediation/compensation    OT Goals(Current goals can be found in the care plan section) Acute Rehab OT Goals Patient Stated Goal: Decrease pain; get better. Daughter is hopeful for return home after rehab OT Goal Formulation: With patient/family Time For Goal Achievement: 03/23/20 Potential to Achieve Goals: Good ADL Goals Pt Will Perform Grooming: with modified independence;standing Pt Will Perform Lower Body Dressing: with modified independence;sit to/from stand Pt Will Transfer to Toilet: with modified independence;ambulating Pt Will Perform Tub/Shower Transfer: with modified independence;ambulating Additional ADL Goal #1: Pt will demonstrate independence with 3/3 back precautions during ADL/IADL and functional mobility.  OT Frequency: Min 2X/week   Barriers to D/C: Decreased caregiver support  pt lives with husband who is unable to provide assistance       Co-evaluation PT/OT/SLP Co-Evaluation/Treatment: Yes Reason for Co-Treatment: Complexity of the patient's impairments (multi-system involvement);To address functional/ADL transfers;For patient/therapist safety PT goals addressed during session: Mobility/safety with mobility;Balance;Proper use of DME OT goals addressed during session: ADL's and self-care      AM-PAC OT "6 Clicks" Daily Activity     Outcome Measure Help from another person eating meals?: A Little Help from another person taking care of personal grooming?: A Little Help from another person toileting, which includes using toliet, bedpan, or urinal?: A Little Help from another person bathing (including washing, rinsing, drying)?: A Little Help from another person to put on and taking off regular upper body clothing?: A Little Help from another person to put on and taking off regular lower body clothing?: A Little 6 Click Score: 18   End of Session Equipment Utilized During Treatment: Gait belt;Rolling walker Nurse  Communication: Mobility status  Activity Tolerance: Patient tolerated treatment well;Patient limited by pain Patient left: in chair;with call bell/phone within reach;with chair alarm set;with family/visitor  present  OT Visit Diagnosis: Other abnormalities of gait and mobility (R26.89);Pain Pain - part of body: (back)                Time: 1131-1200 OT Time Calculation (min): 29 min Charges:  OT General Charges $OT Visit: 1 Visit OT Evaluation $OT Eval Moderate Complexity: [place] OTR/L Acute Rehabilitation Services Office: [place] 03/09/2020, 1:18 PM

## 2020-03-09 NOTE — Progress Notes (Signed)
PROGRESS NOTE    [name]  [id number] DOB: 1923-10-06 DOA: 03/08/2020 PCP: Myrlene Broker, MD   Chef Complaints: back pain  Brief Narrative: 86 y.o. female with medical history significant for type 2 diabetes, hypertension, hyperlipidemia, hypothyroidism, GERD, anxiety/depression, and back pain who presents to the ED for evaluation of abdominal and back pain.  Patient states she fell about 1 week ago.  She says she was outside her home try to get water from the faucet.  She was bent over and when she stood up she became lightheaded and fell down onto the cement/brick walkway.  She began to have lower back pain and was unable to get up on her own power.  She tried calling out to her husband but nobody was near enough to hear her.  She says she was down for about 2 hours until she was able to crawl to an area where a neighbor was passing by to help her.  She was taken to [facility] ED for further evaluation.  She was given fluids for dehydration and hydrocodone to help with pain control.  Since then patient has been having continued lower back and right flank pain which radiates into her abdomen.  She has been having difficulty ambulating even with the use of a walker and has significant trouble transferring into and out of bed.  She has had poor oral intake in the last few days and difficulty with her ADLs.  She denies any recent chest pain, dyspnea, fevers, chills, diaphoresis, or diarrhea.  She says she has had some recent constipation and used a suppository to find some relief.  She currently lives at home with her husband.  Daughter states that family lives close by and neighbors also check in on her.  Subjective:  Still having back pain, reports at 10/10 and cannot eat when hurting on RA. Having her meal. moving her extremities, ext foley-purewick in place- with urine BP running high- lisinopril increased Lives with 54yo husband in [place] Still needing iv morphine  for pain  Assessment & Plan:  Mechanical fall with T11 and T12 fractures with uncontrolled pain: CT abdomen showed  T12 vertebral augmentation with complex likely acute fractures involving T11, T12, consider MRI thoracic spine.With uncontrolled pain needing IV morphine, continue oral opiates, muscle relaxant.  PT OT eval lumbar brace.  Continue bowel regimen.  At risk of dehydration not being able to eat well due to pain, add gentle IV fluid hydration  Hyponatremia mild -due to poor intake.  Adding IV fluids as above.  Hypertension: Blood pressure uncontrolled likely due to pain.  Crease lisinopril to 40 mg daily.  Monitor.  TyPe II diabetes mellitus: Diet controlled, continue to monitor  Depression: Mood is stable on paroxetine.  Hyperlipidemia associated with type 2 diabetes mellitus : Stable on Lipitor.  Hypothyroidism: Continue home Synthroid.  DVT prophylaxis:Heparin Code Status:DNR Family Communication: plan of care discussed with patient at bedside.  Status is: Admitted as observation Patient remains hospitalized due to ongoing severe pain needing IV opiates.Continue PT OT, lives with husband who is also elderly likely to skilled nursing facility.  Dispo: The patient is from: Home              Anticipated d/c is to: TBD              Anticipated d/c date is: 1 day              Patient currently is not medically stable to d/c.  Diet Order            Diet regular Room service appropriate? Yes; Fluid consistency: Thin  Diet effective now              There is no height or weight on file to calculate BMI.  Consultants:see note  Procedures:see note Microbiology:see note  Medications: Scheduled Meds: . atorvastatin  20 mg Oral QHS  . heparin  5,000 Units Subcutaneous Q8H  . levothyroxine  25 mcg Oral Q0600  . lisinopril  40 mg Oral Daily  . pantoprazole  40 mg Oral Daily  . PARoxetine  10 mg Oral QHS  . senna  1 tablet Oral BID   Continuous  Infusions:  Antimicrobials: Anti-infectives (From admission, onward)   None       Objective: Vitals: Today's Vitals   03/08/20 2258 03/08/20 2359 03/09/20 0404 03/09/20 0818  BP:  (!) 156/67 (!) 180/88 (!) 179/75  Pulse:  90 99 87  Resp:  16 16 17   Temp:  98.6 F (37 C) 98.6 F (37 C) 97.9 F (36.6 C)  TempSrc:  Oral Oral Oral  SpO2:  95% 95% 96%  PainSc: 10-Worst pain ever 0-No pain 10-Worst pain ever    No intake or output data in the 24 hours ending 03/09/20 0924 There were no vitals filed for this visit. Weight change:    Intake/Output from previous day: No intake/output data recorded. Intake/Output this shift: No intake/output data recorded.  Examination:  General exam: AAOx3,in pain , weak appearing. HEENT:Oral mucosa moist, Ear/Nose WNL grossly,dentition normal. Respiratory system: bilaterally clear,no wheezing or crackles,no use of accessory muscle, non tender. Cardiovascular system: S1 & S2 +, regular, No JVD. Gastrointestinal system: Abdomen soft, NT,ND, BS+. Nervous System:Alert, awake, moving extremities and grossly nonfocal Extremities: No edema, distal peripheral pulses palpable.  Skin: No rashes,no icterus. MSK: Normal muscle bulk,tone, power Mid-lower back is tender to touch - more on rt side  Data Reviewed: I have personally reviewed following labs and imaging studies CBC: Recent Labs  Lab 03/08/20 1336 03/09/20 0337  WBC 8.6 9.2  NEUTROABS 6.6  --   HGB 11.9* 11.5*  HCT 35.8* 34.7*  MCV 98.6 97.5  PLT 316 [id number]   Basic Metabolic Panel: Recent Labs  Lab 03/08/20 1336 03/09/20 0337  NA 132* 133*  K 4.2 3.6  CL 94* 96*  CO2 24 26  GLUCOSE 122* 122*  BUN 22 18  CREATININE 0.71 0.72  CALCIUM 9.0 8.6*   GFR: CrCl cannot be calculated (Unknown ideal weight.). Liver Function Tests: Recent Labs  Lab 03/08/20 1336  AST 31  ALT 27  ALKPHOS 79  BILITOT 1.1  PROT 6.5  ALBUMIN 3.3*   Recent Labs  Lab 03/08/20 1336  LIPASE 40    No results for input(s): AMMONIA in the last 168 hours. Coagulation Profile: No results for input(s): INR, PROTIME in the last 168 hours. Cardiac Enzymes: No results for input(s): CKTOTAL, CKMB, CKMBINDEX, TROPONINI in the last 168 hours. BNP (last 3 results) No results for input(s): PROBNP in the last 8760 hours. HbA1C: No results for input(s): HGBA1C in the last 72 hours. CBG: No results for input(s): GLUCAP in the last 168 hours. Lipid Profile: No results for input(s): CHOL, HDL, LDLCALC, TRIG, CHOLHDL, LDLDIRECT in the last 72 hours. Thyroid Function Tests: No results for input(s): TSH, T4TOTAL, FREET4, T3FREE, THYROIDAB in the last 72 hours. Anemia Panel: No results for input(s): VITAMINB12, FOLATE, FERRITIN, TIBC, IRON, RETICCTPCT in the last 72  hours. Sepsis Labs: No results for input(s): PROCALCITON, LATICACIDVEN in the last 168 hours.  Recent Results (from the past 240 hour(s))  SARS Coronavirus 2 by RT PCR (hospital order, performed in [facility] hospital lab) Nasopharyngeal Nasopharyngeal Swab     Status: None   Collection Time: 03/08/20  7:02 PM   Specimen: Nasopharyngeal Swab  Result Value Ref Range Status   SARS Coronavirus 2 NEGATIVE NEGATIVE Final    Comment: (NOTE) SARS-CoV-2 target nucleic acids are NOT DETECTED. The SARS-CoV-2 RNA is generally detectable in upper and lower respiratory specimens during the acute phase of infection. The lowest concentration of SARS-CoV-2 viral copies this assay can detect is 250 copies / mL. A negative result does not preclude SARS-CoV-2 infection and should not be used as the sole basis for treatment or other patient management decisions.  A negative result may occur with improper specimen collection / handling, submission of specimen other than nasopharyngeal swab, presence of viral mutation(s) within the areas targeted by this assay, and inadequate number of viral copies (<250 copies / mL). A negative result must be  combined with clinical observations, patient history, and epidemiological information. Fact Sheet for Patients:   StrictlyIdeas.no Fact Sheet for Healthcare Providers: BankingDealers.co.za This test is not yet approved or cleared  by the [place] FDA and has been authorized for detection and/or diagnosis of SARS-CoV-2 by FDA under an Emergency Use Authorization (EUA).  This EUA will remain in effect (meaning this test can be used) for the duration of the COVID-19 declaration under Section 564(b)(1) of the Act, 21 U.S.C. section 360bbb-3(b)(1), unless the authorization is terminated or revoked sooner. Performed at [facility], [place] [address] [place], [place] [address]       Radiology Studies: DG Ribs Unilateral W/Chest Right  Result Date: 03/08/2020 CLINICAL DATA:  [place] 1 week ago with persistent right-sided pain. EXAM: RIGHT RIBS AND CHEST - 3+ VIEW COMPARISON:  02/29/2020 FINDINGS: Rib films do not show any visible rib fracture. There does appear to be mild bibasilar atelectasis. No pneumothorax. IMPRESSION: No rib injury seen. The patient does appear to have mild bibasilar atelectasis. This raises concern about the possibility of an occult/nondisplaced fracture. Electronically Signed   By: Nelson Chimes M.D.   On: 03/08/2020 15:14   CT Chest W Contrast  Result Date: 03/08/2020 CLINICAL DATA:  Fall last Monday. Bruising on lower back and right flank. Abdominal pain. EXAM: CT CHEST, ABDOMEN, AND PELVIS WITH CONTRAST TECHNIQUE: Multidetector CT imaging of the chest, abdomen and pelvis was performed following the standard protocol during bolus administration of intravenous contrast. CONTRAST:  121mL OMNIPAQUE IOHEXOL 300 MG/ML  SOLN COMPARISON:  Chest and rib radiographs 03/08/2020. FINDINGS: CT CHEST FINDINGS Cardiovascular: Aortic atherosclerosis. Tortuous thoracic aorta. Mild cardiomegaly, without pericardial effusion.  Mediastinum/Nodes: No mediastinal or hilar adenopathy. Lungs/Pleura: Small, right greater than left pleural effusions. No pneumothorax. Right apical pulmonary nodule measures 4 mm on 29/4. Bibasilar atelectasis. Calcified right upper lobe granuloma. Musculoskeletal: T12 vertebral augmentation with an underlying moderate compression deformity. Coronal fracture lines are identified at the junction of the body and pedicles including on 47/3. Complex fracture is identified within the T11 vertebral body as well, including on coronal image 109. This is primarily within the transverse plane. Lower cervical spondylosis. CT ABDOMEN PELVIS FINDINGS Hepatobiliary: Normal liver. Cholecystectomy. The common duct measures up to 1.2 cm on 55/3. No obstructive stone or mass. Pancreas: Normal pancreas, without duct dilatation or acute inflammation. Spleen: Normal in size, without focal  abnormality. Adrenals/Urinary Tract: Normal adrenal glands. Bilateral renal cortical thinning, within normal variation for age. No hydronephrosis. Normal urinary bladder. Stomach/Bowel: Proximal gastric underdistention. Scattered colonic diverticula. Colonic stool burden suggests constipation. Normal terminal ileum. Normal small bowel. Vascular/Lymphatic: Aortic atherosclerosis. Mild ectasia of the celiac. No abdominopelvic adenopathy. Reproductive: Hysterectomy.  No adnexal mass. Other: No significant free fluid.  No free intraperitoneal air. Musculoskeletal: Remote bilateral pubic rami fractures. Sclerotic lesion within the right ischium measures 1.2 cm on 107/3. Osteopenia. L1 vertebral hemangioma. Lumbar spine to be evaluated on dedicated report, dictated separately. IMPRESSION: 1. Status post T12 vertebral augmentation with complex, likely acute fractures involving T11 and T12. Consider thoracic spine MRI. 2. No other acute posttraumatic deformity identified. 3. Small bilateral pleural effusions with compressive atelectasis in both lung bases. 4.  Cholecystectomy with mild biliary duct dilatation. Correlate with bilirubin levels. If normal, this is likely within normal variation for age. 5. Sclerotic lesion within the right ischium is most likely a bone island. Correlate with any history of primary malignancy. 6. Right apical pulmonary nodule of 4 mm. No follow-up needed if patient is low-risk. Non-contrast chest CT can be considered in 12 months if patient is high-risk. This recommendation follows the consensus statement: Guidelines for Management of Incidental Pulmonary Nodules Detected on CT Images: From the Fleischner Society 2017; Radiology 2017; 284:228-243. Electronically Signed   By: Abigail Miyamoto M.D.   On: 03/08/2020 16:42   CT ABDOMEN PELVIS W CONTRAST  Result Date: 03/08/2020 CLINICAL DATA:  Fall last Monday. Bruising on lower back and right flank. Abdominal pain. EXAM: CT CHEST, ABDOMEN, AND PELVIS WITH CONTRAST TECHNIQUE: Multidetector CT imaging of the chest, abdomen and pelvis was performed following the standard protocol during bolus administration of intravenous contrast. CONTRAST:  187mL OMNIPAQUE IOHEXOL 300 MG/ML  SOLN COMPARISON:  Chest and rib radiographs 03/08/2020. FINDINGS: CT CHEST FINDINGS Cardiovascular: Aortic atherosclerosis. Tortuous thoracic aorta. Mild cardiomegaly, without pericardial effusion. Mediastinum/Nodes: No mediastinal or hilar adenopathy. Lungs/Pleura: Small, right greater than left pleural effusions. No pneumothorax. Right apical pulmonary nodule measures 4 mm on 29/4. Bibasilar atelectasis. Calcified right upper lobe granuloma. Musculoskeletal: T12 vertebral augmentation with an underlying moderate compression deformity. Coronal fracture lines are identified at the junction of the body and pedicles including on 47/3. Complex fracture is identified within the T11 vertebral body as well, including on coronal image 109. This is primarily within the transverse plane. Lower cervical spondylosis. CT ABDOMEN PELVIS  FINDINGS Hepatobiliary: Normal liver. Cholecystectomy. The common duct measures up to 1.2 cm on 55/3. No obstructive stone or mass. Pancreas: Normal pancreas, without duct dilatation or acute inflammation. Spleen: Normal in size, without focal abnormality. Adrenals/Urinary Tract: Normal adrenal glands. Bilateral renal cortical thinning, within normal variation for age. No hydronephrosis. Normal urinary bladder. Stomach/Bowel: Proximal gastric underdistention. Scattered colonic diverticula. Colonic stool burden suggests constipation. Normal terminal ileum. Normal small bowel. Vascular/Lymphatic: Aortic atherosclerosis. Mild ectasia of the celiac. No abdominopelvic adenopathy. Reproductive: Hysterectomy.  No adnexal mass. Other: No significant free fluid.  No free intraperitoneal air. Musculoskeletal: Remote bilateral pubic rami fractures. Sclerotic lesion within the right ischium measures 1.2 cm on 107/3. Osteopenia. L1 vertebral hemangioma. Lumbar spine to be evaluated on dedicated report, dictated separately. IMPRESSION: 1. Status post T12 vertebral augmentation with complex, likely acute fractures involving T11 and T12. Consider thoracic spine MRI. 2. No other acute posttraumatic deformity identified. 3. Small bilateral pleural effusions with compressive atelectasis in both lung bases. 4. Cholecystectomy with mild biliary duct dilatation. Correlate with bilirubin levels.  If normal, this is likely within normal variation for age. 5. Sclerotic lesion within the right ischium is most likely a bone island. Correlate with any history of primary malignancy. 6. Right apical pulmonary nodule of 4 mm. No follow-up needed if patient is low-risk. Non-contrast chest CT can be considered in 12 months if patient is high-risk. This recommendation follows the consensus statement: Guidelines for Management of Incidental Pulmonary Nodules Detected on CT Images: From the Fleischner Society 2017; Radiology 2017; 284:228-243.  Electronically Signed   By: Abigail Miyamoto M.D.   On: 03/08/2020 16:42   CT L-SPINE NO CHARGE  Result Date: 03/08/2020 CLINICAL DATA:  [place] a week ago with persistent back pain. EXAM: CT LUMBAR SPINE WITHOUT CONTRAST TECHNIQUE: Multidetector CT imaging of the lumbar spine was performed without intravenous contrast administration. Multiplanar CT image reconstructions were also generated. COMPARISON:  02/29/2020 FINDINGS: Segmentation: 5 lumbar type vertebral bodies. Alignment: Normal except for mild curvature. 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 No lumbar region fracture. Old healed sacral fracture at the S2 level. Previously augmented fracture at T12. We do not see above the inferior aspect on this lumbar examination. Referring to the whole body CT, there appears to be pedicle fractures present. I think these were visible on the previous study but were not nearly as conspicuous. There is also some loss of height at the inferior T11 vertebral body that could be active. Paraspinal and other soft tissues: See results of abdominal CT. Disc levels: Moderate stenosis at the L2-3 level due to endplate osteophytes, bulging of the disc and facet and ligamentous hypertrophy. Mild lateral recess stenosis the L4-5 level because of similar findings. IMPRESSION: No acute fracture in the lumbar region. Previously augmented fracture at inferior T12. No evidence of progressive loss of height at that level. Patient does have bilateral pedicle fractures at this level which are more conspicuous than on the study of 02/29/2020. This could have been exacerbated by the recent fall. Inferior T11 fracture which is probably recent. This was present at least to a degree on 02/29/2020 but appears to have worsened. These thoracic findings are taken from the larger CT of the chest and abdomen done today rather than the reconstructed images ordered in the lumbar spine. Electronically Signed   By: Nelson Chimes M.D.   On:  03/08/2020 16:22     LOS: 0 days   Antonieta Pert, MD [facility]  03/09/2020, 9:24 AM

## 2020-03-09 NOTE — NC FL2 (Signed)
[place] LEVEL OF CARE SCREENING TOOL     IDENTIFICATION  Patient Name: [name] Birthdate: 08-Jul-1923 Sex: female Admission Date (Current Location): 03/08/2020  [facility] and [place] Number:  Herbalist and Address:  [facility] [place], [place] [address], [place], [place] [address]      Provider Number: [id number]  Attending Physician Name and Address:  Antonieta Pert, MD  Relative Name and Phone Number:  Cleda Mccreedy - daughter - [phone]    Current Level of Care: Hospital Recommended Level of Care: [place] Prior Approval Number:    Date Approved/Denied:   PASRR Number: [id number]  Discharge Plan: SNF    Current Diagnoses: Patient Active Problem List   Diagnosis Date Noted   Closed T12 fracture ([place]) 03/08/2020   Type II diabetes mellitus ([place])    Depression    Hyperlipidemia associated with type 2 diabetes mellitus ([place])    Hypothyroidism    Hypertension associated with diabetes ([place])    Screening for viral disease 08/13/2019   Heme positive stool 08/13/2019   Anemia 08/13/2019   Melena 08/13/2019   S/P total knee replacement 04/22/2017    Orientation RESPIRATION BLADDER Height & Weight     Self, Time, Situation, Place  Normal External catheter Weight:   Height:     BEHAVIORAL SYMPTOMS/MOOD NEUROLOGICAL BOWEL NUTRITION STATUS      Continent Diet(see discharge summary)  AMBULATORY STATUS COMMUNICATION OF NEEDS Skin   Limited Assist Verbally Bruising(buttocks)                       Personal Care Assistance Level of Assistance  Bathing, Feeding, Dressing Bathing Assistance: Limited assistance Feeding assistance: Independent Dressing Assistance: Limited assistance     Functional Limitations Info  Sight, Hearing, Speech Sight Info: Impaired Hearing Info: Impaired(wears hearing aids) Speech Info: Adequate    SPECIAL CARE FACTORS FREQUENCY  PT (By licensed PT), OT (By  licensed OT)     PT Frequency: 5 times per day OT Frequency: 5 times per day            Contractures Contractures Info: Not present    Additional Factors Info  Code Status, Allergies Code Status Info: DNR Allergies Info: Sulfa           Current Medications (03/09/2020):  This is the current hospital active medication list Current Facility-Administered Medications  Medication Dose Route Frequency Provider Last Rate Last Admin   0.9 %  sodium chloride infusion   Intravenous Continuous Kc, Ramesh, MD 50 mL/hr at 03/09/20 1408 New Bag at 03/09/20 1408   acetaminophen (TYLENOL) tablet 1,000 mg  1,000 mg Oral Q6H PRN Zada Finders R, MD   1,000 mg at 03/09/20 1026   Or   acetaminophen (TYLENOL) suppository 650 mg  650 mg Rectal Q6H PRN Lenore Cordia, MD       atorvastatin (LIPITOR) tablet 20 mg  20 mg Oral QHS Zada Finders R, MD   20 mg at 03/08/20 2135   heparin injection 5,000 Units  5,000 Units Subcutaneous Q8H Zada Finders R, MD   5,000 Units at 03/09/20 1408   levothyroxine (SYNTHROID) tablet 25 mcg  25 mcg Oral Q0600 Zada Finders R, MD   25 mcg at 03/09/20 0602   lisinopril (ZESTRIL) tablet 40 mg  40 mg Oral Daily Kc, Maren [place], MD   40 mg at 03/09/20 0832   methocarbamol (ROBAXIN) 500 mg in dextrose 5 % 50 mL  IVPB  500 mg Intravenous Q8H PRN Kc, Ramesh, MD       morphine 2 MG/ML injection 1 mg  1 mg Intravenous Q4H PRN Zada Finders R, MD   1 mg at 03/09/20 1026   ondansetron (ZOFRAN) tablet 4 mg  4 mg Oral Q6H PRN Lenore Cordia, MD       Or   ondansetron (ZOFRAN) injection 4 mg  4 mg Intravenous Q6H PRN Zada Finders R, MD       oxyCODONE (Oxy IR/ROXICODONE) immediate release tablet 5 mg  5 mg Oral Q6H PRN Zada Finders R, MD   5 mg at 03/09/20 0832   pantoprazole (PROTONIX) EC tablet 40 mg  40 mg Oral Daily Lenore Cordia, MD   40 mg at 03/09/20 [id number]   PARoxetine (PAXIL) tablet 10 mg  10 mg Oral QHS Zada Finders R, MD   10 mg at 03/08/20 2148   senna  (SENOKOT) tablet 8.6 mg  1 tablet Oral BID Lenore Cordia, MD   8.6 mg at 03/09/20 0831     Discharge Medications: Please see discharge summary for a list of discharge medications.  Relevant Imaging Results:  Relevant Lab Results:   Additional Information SS # [id number]  Curlene Labrum, RN

## 2020-03-09 NOTE — Plan of Care (Signed)

## 2020-03-09 NOTE — Progress Notes (Signed)
Orthopedic Tech Progress Note Patient Details:  [name] [birth year] [id number] Called in order to [facility] for a LUMBAR CORSETT Patient ID: [name], female   DOB: March 25, 1923, 86 y.o.   MRN: [id number]   [name] 03/09/2020, 12:24 PM

## 2020-03-09 NOTE — TOC Initial Note (Signed)
Transition of Care [facility]) - Initial/Assessment Note    Patient Details  Name: [name] MRN: [id number] Date of Birth: [birth year]  Transition of Care [facility]) CM/SW Contact:    Curlene Labrum, RN Phone Number: 03/09/2020, 3:52 PM  Clinical Narrative:                 Case management met with the patient and daughter at the bedside.  The patient fell at home with S/P closed T12 fracture.  The patient takes care of her husband at home with assistance from family.  PT evaluated the patient and patient is agreeable to SNF placement.  I gave medicare choice to the family and placed the patient in the hub for SNF placement.  The family gave [facility] SNF as first choice  - but will confirm this by tomorrow.  Patient has had both COVID vaccines.  Expected Discharge Plan: Skilled Nursing Facility Barriers to Discharge: (SNF placement)   Patient Goals and CMS Choice Patient states their goals for this hospitalization and ongoing recovery are:: Patient wants to get better. CMS Medicare.gov Compare Post Acute Care list provided to:: Patient Choice offered to / list presented to : Patient, [facility] POA / Guardian  Expected Discharge Plan and Services Expected Discharge Plan: [place]   Discharge Planning Services: CM Consult Post Acute Care Choice: Durable Medical Equipment, [facility] Living arrangements for the past 2 months: Single Family Home                                      Prior Living Arrangements/Services Living arrangements for the past 2 months: Single Family Home Lives with:: Spouse Patient language and need for interpreter reviewed:: Yes Do you feel safe going back to the place where you live?: Yes      Need for Family Participation in Patient Care: Yes (Comment) Care giver support system in place?: Yes (comment) Current home services: DME Criminal Activity/Legal Involvement Pertinent to Current Situation/Hospitalization: No -  Comment as needed  Activities of Daily Living Home Assistive Devices/Equipment: Walker (specify type) ADL Screening (condition at time of admission) Patient's cognitive ability adequate to safely complete daily activities?: Yes Is the patient deaf or have difficulty hearing?: Yes Does the patient have difficulty seeing, even when wearing glasses/contacts?: No Does the patient have difficulty concentrating, remembering, or making decisions?: No Patient able to express need for assistance with ADLs?: Yes Does the patient have difficulty dressing or bathing?: No Independently performs ADLs?: Yes (appropriate for developmental age) Does the patient have difficulty walking or climbing stairs?: No Weakness of Legs: None Weakness of Arms/Hands: None  Permission Sought/Granted Permission sought to share information with : Case Manager Permission granted to share information with : Yes, Verbal Permission Granted     Permission granted to share info w AGENCY: SNF facilities        Emotional Assessment Appearance:: Appears stated age Attitude/Demeanor/Rapport: Gracious Affect (typically observed): Accepting Orientation: : Oriented to Self, Oriented to Place, Oriented to  Time, Oriented to Situation Alcohol / Substance Use: Not Applicable Psych Involvement: No (comment)  Admission diagnosis:  Fall [W19.XXXA] Closed T12 fracture ([place]) [S22.089A] Closed fracture of multiple thoracic vertebrae, initial encounter ([place]) [S22.009A] Back pain [M54.9] Patient Active Problem List   Diagnosis Date Noted  . Back pain 03/09/2020  . Closed T12 fracture ([place]) 03/08/2020  . Type II diabetes mellitus ([place])   .  Depression   . Hyperlipidemia associated with type 2 diabetes mellitus ([place])   . Hypothyroidism   . Hypertension associated with diabetes ([place])   . Screening for viral disease 08/13/2019  . Heme positive stool 08/13/2019  . Anemia 08/13/2019  . Melena 08/13/2019  . S/P total knee  replacement 04/22/2017   PCP:  Myrlene Broker, MD Pharmacy:   [facility] [address] [place] [address] Phone: [phone] Fax: [phone]     Social Determinants of Health (SDOH) Interventions    Readmission Risk Interventions No flowsheet data found.

## 2020-03-09 NOTE — Plan of Care (Signed)
  Problem: Education: Goal: Knowledge of General Education information will improve Description: Including pain rating scale, medication(s)/side effects and non-pharmacologic comfort measures Outcome: Progressing   Problem: Activity: Goal: Risk for activity intolerance will decrease Outcome: Progressing   

## 2020-03-10 ENCOUNTER — Inpatient Hospital Stay (HOSPITAL_COMMUNITY): Payer: Medicare Other

## 2020-03-10 LAB — BASIC METABOLIC PANEL
Anion gap: 6 (ref 5–15)
BUN: 23 mg/dL (ref 8–23)
CO2: 29 mmol/L (ref 22–32)
Calcium: 8.3 mg/dL — ABNORMAL LOW (ref 8.9–10.3)
Chloride: 98 mmol/L (ref 98–111)
Creatinine, Ser: 0.82 mg/dL (ref 0.44–1.00)
GFR calc Af Amer: >60 mL/min (ref >60)
GFR calc non Af Amer: >60 mL/min (ref >60)
Glucose, Bld: 129 mg/dL — ABNORMAL HIGH (ref 70–99)
Potassium: 4.2 mmol/L (ref 3.5–5.1)
Sodium: 133 mmol/L — ABNORMAL LOW (ref 135–145)

## 2020-03-10 MED ORDER — POLYETHYLENE GLYCOL 3350 17 G PO PACK: 17.0000 g | PACK | Freq: Every day | ORAL | Status: DC | PRN

## 2020-03-10 MED ORDER — SODIUM CHLORIDE 0.9 % IV SOLN: INTRAVENOUS | Status: DC

## 2020-03-10 MED ORDER — MAGNESIUM CITRATE PO SOLN
1.0000 | Freq: Once | ORAL | Status: AC
  Administered 2020-03-10: 1 via ORAL
  Filled 2020-03-10: qty 296

## 2020-03-10 MED ORDER — DOCUSATE SODIUM 100 MG PO CAPS
100.0000 mg | ORAL_CAPSULE | Freq: Two times a day (BID) | ORAL | Status: DC
  Administered 2020-03-10 – 2020-03-15 (×10): 100 mg via ORAL
  Filled 2020-03-10 (×10): qty 1

## 2020-03-10 NOTE — Progress Notes (Signed)
PROGRESS NOTE    [name]  [id number] DOB: 19-Sep-1923 DOA: 03/08/2020 PCP: Myrlene Broker, MD   Chef Complaints: back pain  Brief Narrative: 86 y.o. female with medical history significant for type 2 diabetes, hypertension, hyperlipidemia, hypothyroidism, GERD, anxiety/depression, and back pain who presents to the ED for evaluation of abdominal and back pain.  Patient states she fell about 1 week ago.  She says she was outside her home try to get water from the faucet.  She was bent over and when she stood up she became lightheaded and fell down onto the cement/brick walkway.  She began to have lower back pain and was unable to get up on her own power.  She tried calling out to her husband but nobody was near enough to hear her.  She says she was down for about 2 hours until she was able to crawl to an area where a neighbor was passing by to help her.  She was taken to [facility] ED for further evaluation.  She was given fluids for dehydration and hydrocodone to help with pain control.  Since then patient has been having continued lower back and right flank pain which radiates into her abdomen.  She has been having difficulty ambulating even with the use of a walker and has significant trouble transferring into and out of bed.  She has had poor oral intake in the last few days and difficulty with her ADLs.  She denies any recent chest pain, dyspnea, fevers, chills, diaphoresis, or diarrhea.  She says she has had some recent constipation and used a suppository to find some relief.  She currently lives at home with her husband.  Daughter states that family lives close by and neighbors also check in on her.  Subjective:  Alert,awaek,. Heard of hearing, on ra still c/o ongoing pain, hurts more on rt side with leg movement needing morphine iv for pain  Assessment & Plan:  Mechanical fall with T11-12 fractures with uncontrolled pain : CT abdomen showed  T12 vertebral  augmentation with complex likely acute fractures involving T11, T12.  Patient with ongoing pain, cont pain management with IV morphine, oral opiates, muscle relaxant.  PT OT,lumbar brace. Obtain MRI T Spine due to uncontrolled pain. Continue bowel regimen.  At risk of dehydration not eating well due to pain continue gentle IV fluids. Hyponatremia sodium at lower side, continue normal saline fluids gently.  HTN: BP was is poorly controlled, continue on increased lisinopril 40 mg daily.  Optimize pain medication which could be contributing her hypertension   T2DM: Diet controlled.  Depression: Mood is stable on paroxetine.  Hyperlipidemia associated with type 2 diabetes mellitus : Stable on Lipitor.  Hypothyroidism: Continue home Synthroid.  DVT prophylaxis:Heparin Code Status:DNR Family Communication: plan of care discussed with patient at bedside.  I called patient's daughter's number no answer.  Status is: Admitted as observation Patient remains hospitalized due to ongoing severe pain needing IV opiates.Continue PT OT, lives with husband who is also elderly likely to skilled nursing facility.  Dispo: The patient is from: Home              Anticipated d/c is to: TBD              Anticipated d/c date is: 1 day              Patient currently is not medically stable to d/c.  Diet Order  Diet regular Room service appropriate? Yes; Fluid consistency: Thin  Diet effective now              There is no height or weight on file to calculate BMI.  Consultants:see note  Procedures:see note Microbiology:see note  Medications: Scheduled Meds: . atorvastatin  20 mg Oral QHS  . heparin  5,000 Units Subcutaneous Q8H  . levothyroxine  25 mcg Oral Q0600  . lisinopril  40 mg Oral Daily  . pantoprazole  40 mg Oral Daily  . PARoxetine  10 mg Oral QHS  . senna  1 tablet Oral BID   Continuous Infusions: . sodium chloride 50 mL/hr at 03/09/20 1408  . methocarbamol (ROBAXIN) IV       Antimicrobials: Anti-infectives (From admission, onward)   None       Objective: Vitals: Today's Vitals   03/09/20 1949 03/09/20 2027 03/10/20 0442 03/10/20 0737  BP: 138/65  (!) 173/78 (!) 165/78  Pulse: 79  79 83  Resp: 14  14 17   Temp: 98.3 F (36.8 C)  97.8 F (36.6 C) 98.5 F (36.9 C)  TempSrc: Oral  Oral Oral  SpO2: 91%  93% 93%  PainSc:  10-Worst pain ever      Intake/Output Summary (Last 24 hours) at 03/10/2020 0859 Last data filed at 03/10/2020 0442 Gross per 24 hour  Intake 240 ml  Output 800 ml  Net -560 ml   There were no vitals filed for this visit. Weight change:    Intake/Output from previous day: 06/02 0701 - 06/03 0700 In: 240 [P.O.:240] Out: 800 [Urine:800] Intake/Output this shift: No intake/output data recorded.  Examination:  General exam: AAO at baseline, heard of hearing,in pain HEENT:Oral mucosa moist, Ear/Nose WNL grossly, dentition normal. Respiratory system: bilaterally clear,no wheezing or crackles,no use of accessory muscle Cardiovascular system: S1 & S2 +, No JVD,. Gastrointestinal system: Abdomen soft, NT,ND, BS+ Nervous System:Alert, awake, moving extremities and grossly nonfocal Extremities: No edema, distal peripheral pulses palpable.  Skin: No rashes,no icterus. MSK: Normal muscle bulk,tone, and painful rt leg movement. Tender on rt flank area,   Data Reviewed: I have personally reviewed following labs and imaging studies CBC: Recent Labs  Lab 03/08/20 1336 03/09/20 0337  WBC 8.6 9.2  NEUTROABS 6.6  --   HGB 11.9* 11.5*  HCT 35.8* 34.7*  MCV 98.6 97.5  PLT 316 [id number]   Basic Metabolic Panel: Recent Labs  Lab 03/08/20 1336 03/09/20 0337 03/10/20 0438  NA 132* 133* 133*  K 4.2 3.6 4.2  CL 94* 96* 98  CO2 24 26 29   GLUCOSE 122* 122* 129*  BUN 22 18 23   CREATININE 0.71 0.72 0.82  CALCIUM 9.0 8.6* 8.3*   GFR: CrCl cannot be calculated (Unknown ideal weight.). Liver Function Tests: Recent Labs  Lab  03/08/20 1336  AST 31  ALT 27  ALKPHOS 79  BILITOT 1.1  PROT 6.5  ALBUMIN 3.3*   Recent Labs  Lab 03/08/20 1336  LIPASE 40   No results for input(s): AMMONIA in the last 168 hours. Coagulation Profile: No results for input(s): INR, PROTIME in the last 168 hours. Cardiac Enzymes: No results for input(s): CKTOTAL, CKMB, CKMBINDEX, TROPONINI in the last 168 hours. BNP (last 3 results) No results for input(s): PROBNP in the last 8760 hours. HbA1C: No results for input(s): HGBA1C in the last 72 hours. CBG: No results for input(s): GLUCAP in the last 168 hours. Lipid Profile: No results for input(s): CHOL, HDL, LDLCALC, TRIG, CHOLHDL, LDLDIRECT in  the last 72 hours. Thyroid Function Tests: No results for input(s): TSH, T4TOTAL, FREET4, T3FREE, THYROIDAB in the last 72 hours. Anemia Panel: No results for input(s): VITAMINB12, FOLATE, FERRITIN, TIBC, IRON, RETICCTPCT in the last 72 hours. Sepsis Labs: No results for input(s): PROCALCITON, LATICACIDVEN in the last 168 hours.  Recent Results (from the past 240 hour(s))  Urine culture     Status: None   Collection Time: 03/08/20  1:55 PM   Specimen: Urine, Random  Result Value Ref Range Status   Specimen Description URINE, RANDOM  Final   Special Requests NONE  Final   Culture   Final    NO GROWTH Performed at [facility], [address], [place], [place] [address]    Report Status 03/09/2020 FINAL  Final  SARS Coronavirus 2 by RT PCR (hospital order, performed in [facility] hospital lab) Nasopharyngeal Nasopharyngeal Swab     Status: None   Collection Time: 03/08/20  7:02 PM   Specimen: Nasopharyngeal Swab  Result Value Ref Range Status   SARS Coronavirus 2 NEGATIVE NEGATIVE Final    Comment: (NOTE) SARS-CoV-2 target nucleic acids are NOT DETECTED. The SARS-CoV-2 RNA is generally detectable in upper and lower respiratory specimens during the acute phase of infection. The lowest concentration of SARS-CoV-2 viral  copies this assay can detect is 250 copies / mL. A negative result does not preclude SARS-CoV-2 infection and should not be used as the sole basis for treatment or other patient management decisions.  A negative result may occur with improper specimen collection / handling, submission of specimen other than nasopharyngeal swab, presence of viral mutation(s) within the areas targeted by this assay, and inadequate number of viral copies (<250 copies / mL). A negative result must be combined with clinical observations, patient history, and epidemiological information. Fact Sheet for Patients:   StrictlyIdeas.no Fact Sheet for Healthcare Providers: BankingDealers.co.za This test is not yet approved or cleared  by the [place] FDA and has been authorized for detection and/or diagnosis of SARS-CoV-2 by FDA under an Emergency Use Authorization (EUA).  This EUA will remain in effect (meaning this test can be used) for the duration of the COVID-19 declaration under Section 564(b)(1) of the Act, 21 U.S.C. section 360bbb-3(b)(1), unless the authorization is terminated or revoked sooner. Performed at [facility], [place] [address] [place], [place] [address]       Radiology Studies: DG Ribs Unilateral W/Chest Right  Result Date: 03/08/2020 CLINICAL DATA:  [place] 1 week ago with persistent right-sided pain. EXAM: RIGHT RIBS AND CHEST - 3+ VIEW COMPARISON:  02/29/2020 FINDINGS: Rib films do not show any visible rib fracture. There does appear to be mild bibasilar atelectasis. No pneumothorax. IMPRESSION: No rib injury seen. The patient does appear to have mild bibasilar atelectasis. This raises concern about the possibility of an occult/nondisplaced fracture. Electronically Signed   By: Nelson Chimes M.D.   On: 03/08/2020 15:14   CT Chest W Contrast  Result Date: 03/08/2020 CLINICAL DATA:  Fall last Monday. Bruising on lower back and right flank.  Abdominal pain. EXAM: CT CHEST, ABDOMEN, AND PELVIS WITH CONTRAST TECHNIQUE: Multidetector CT imaging of the chest, abdomen and pelvis was performed following the standard protocol during bolus administration of intravenous contrast. CONTRAST:  197mL OMNIPAQUE IOHEXOL 300 MG/ML  SOLN COMPARISON:  Chest and rib radiographs 03/08/2020. FINDINGS: CT CHEST FINDINGS Cardiovascular: Aortic atherosclerosis. Tortuous thoracic aorta. Mild cardiomegaly, without pericardial effusion. Mediastinum/Nodes: No mediastinal or hilar adenopathy. Lungs/Pleura: Small, right  greater than left pleural effusions. No pneumothorax. Right apical pulmonary nodule measures 4 mm on 29/4. Bibasilar atelectasis. Calcified right upper lobe granuloma. Musculoskeletal: T12 vertebral augmentation with an underlying moderate compression deformity. Coronal fracture lines are identified at the junction of the body and pedicles including on 47/3. Complex fracture is identified within the T11 vertebral body as well, including on coronal image 109. This is primarily within the transverse plane. Lower cervical spondylosis. CT ABDOMEN PELVIS FINDINGS Hepatobiliary: Normal liver. Cholecystectomy. The common duct measures up to 1.2 cm on 55/3. No obstructive stone or mass. Pancreas: Normal pancreas, without duct dilatation or acute inflammation. Spleen: Normal in size, without focal abnormality. Adrenals/Urinary Tract: Normal adrenal glands. Bilateral renal cortical thinning, within normal variation for age. No hydronephrosis. Normal urinary bladder. Stomach/Bowel: Proximal gastric underdistention. Scattered colonic diverticula. Colonic stool burden suggests constipation. Normal terminal ileum. Normal small bowel. Vascular/Lymphatic: Aortic atherosclerosis. Mild ectasia of the celiac. No abdominopelvic adenopathy. Reproductive: Hysterectomy.  No adnexal mass. Other: No significant free fluid.  No free intraperitoneal air. Musculoskeletal: Remote bilateral pubic  rami fractures. Sclerotic lesion within the right ischium measures 1.2 cm on 107/3. Osteopenia. L1 vertebral hemangioma. Lumbar spine to be evaluated on dedicated report, dictated separately. IMPRESSION: 1. Status post T12 vertebral augmentation with complex, likely acute fractures involving T11 and T12. Consider thoracic spine MRI. 2. No other acute posttraumatic deformity identified. 3. Small bilateral pleural effusions with compressive atelectasis in both lung bases. 4. Cholecystectomy with mild biliary duct dilatation. Correlate with bilirubin levels. If normal, this is likely within normal variation for age. 5. Sclerotic lesion within the right ischium is most likely a bone island. Correlate with any history of primary malignancy. 6. Right apical pulmonary nodule of 4 mm. No follow-up needed if patient is low-risk. Non-contrast chest CT can be considered in 12 months if patient is high-risk. This recommendation follows the consensus statement: Guidelines for Management of Incidental Pulmonary Nodules Detected on CT Images: From the Fleischner Society 2017; Radiology 2017; 284:228-243. Electronically Signed   By: Abigail Miyamoto M.D.   On: 03/08/2020 16:42   CT ABDOMEN PELVIS W CONTRAST  Result Date: 03/08/2020 CLINICAL DATA:  Fall last Monday. Bruising on lower back and right flank. Abdominal pain. EXAM: CT CHEST, ABDOMEN, AND PELVIS WITH CONTRAST TECHNIQUE: Multidetector CT imaging of the chest, abdomen and pelvis was performed following the standard protocol during bolus administration of intravenous contrast. CONTRAST:  157mL OMNIPAQUE IOHEXOL 300 MG/ML  SOLN COMPARISON:  Chest and rib radiographs 03/08/2020. FINDINGS: CT CHEST FINDINGS Cardiovascular: Aortic atherosclerosis. Tortuous thoracic aorta. Mild cardiomegaly, without pericardial effusion. Mediastinum/Nodes: No mediastinal or hilar adenopathy. Lungs/Pleura: Small, right greater than left pleural effusions. No pneumothorax. Right apical pulmonary  nodule measures 4 mm on 29/4. Bibasilar atelectasis. Calcified right upper lobe granuloma. Musculoskeletal: T12 vertebral augmentation with an underlying moderate compression deformity. Coronal fracture lines are identified at the junction of the body and pedicles including on 47/3. Complex fracture is identified within the T11 vertebral body as well, including on coronal image 109. This is primarily within the transverse plane. Lower cervical spondylosis. CT ABDOMEN PELVIS FINDINGS Hepatobiliary: Normal liver. Cholecystectomy. The common duct measures up to 1.2 cm on 55/3. No obstructive stone or mass. Pancreas: Normal pancreas, without duct dilatation or acute inflammation. Spleen: Normal in size, without focal abnormality. Adrenals/Urinary Tract: Normal adrenal glands. Bilateral renal cortical thinning, within normal variation for age. No hydronephrosis. Normal urinary bladder. Stomach/Bowel: Proximal gastric underdistention. Scattered colonic diverticula. Colonic stool burden suggests constipation. Normal  terminal ileum. Normal small bowel. Vascular/Lymphatic: Aortic atherosclerosis. Mild ectasia of the celiac. No abdominopelvic adenopathy. Reproductive: Hysterectomy.  No adnexal mass. Other: No significant free fluid.  No free intraperitoneal air. Musculoskeletal: Remote bilateral pubic rami fractures. Sclerotic lesion within the right ischium measures 1.2 cm on 107/3. Osteopenia. L1 vertebral hemangioma. Lumbar spine to be evaluated on dedicated report, dictated separately. IMPRESSION: 1. Status post T12 vertebral augmentation with complex, likely acute fractures involving T11 and T12. Consider thoracic spine MRI. 2. No other acute posttraumatic deformity identified. 3. Small bilateral pleural effusions with compressive atelectasis in both lung bases. 4. Cholecystectomy with mild biliary duct dilatation. Correlate with bilirubin levels. If normal, this is likely within normal variation for age. 5. Sclerotic  lesion within the right ischium is most likely a bone island. Correlate with any history of primary malignancy. 6. Right apical pulmonary nodule of 4 mm. No follow-up needed if patient is low-risk. Non-contrast chest CT can be considered in 12 months if patient is high-risk. This recommendation follows the consensus statement: Guidelines for Management of Incidental Pulmonary Nodules Detected on CT Images: From the Fleischner Society 2017; Radiology 2017; 284:228-243. Electronically Signed   By: Abigail Miyamoto M.D.   On: 03/08/2020 16:42   CT L-SPINE NO CHARGE  Result Date: 03/08/2020 CLINICAL DATA:  [place] a week ago with persistent back pain. EXAM: CT LUMBAR SPINE WITHOUT CONTRAST TECHNIQUE: Multidetector CT imaging of the lumbar spine was performed without intravenous contrast administration. Multiplanar CT image reconstructions were also generated. COMPARISON:  02/29/2020 FINDINGS: Segmentation: 5 lumbar type vertebral bodies. Alignment: Normal except for mild curvature. 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 No lumbar region fracture. Old healed sacral fracture at the S2 level. Previously augmented fracture at T12. We do not see above the inferior aspect on this lumbar examination. Referring to the whole body CT, there appears to be pedicle fractures present. I think these were visible on the previous study but were not nearly as conspicuous. There is also some loss of height at the inferior T11 vertebral body that could be active. Paraspinal and other soft tissues: See results of abdominal CT. Disc levels: Moderate stenosis at the L2-3 level due to endplate osteophytes, bulging of the disc and facet and ligamentous hypertrophy. Mild lateral recess stenosis the L4-5 level because of similar findings. IMPRESSION: No acute fracture in the lumbar region. Previously augmented fracture at inferior T12. No evidence of progressive loss of height at that level. Patient does have bilateral pedicle  fractures at this level which are more conspicuous than on the study of 02/29/2020. This could have been exacerbated by the recent fall. Inferior T11 fracture which is probably recent. This was present at least to a degree on 02/29/2020 but appears to have worsened. These thoracic findings are taken from the larger CT of the chest and abdomen done today rather than the reconstructed images ordered in the lumbar spine. Electronically Signed   By: Nelson Chimes M.D.   On: 03/08/2020 16:22     LOS: 1 day   Antonieta Pert, MD [facility]  03/10/2020, 8:59 AM

## 2020-03-10 NOTE — Consult Note (Signed)
Reason for Consult: T11 compression fracture Referring Physician: Dr. Girard Perry is an 86 y.o. female.  HPI: Patient with a medical history outlines below. She fell on 02/29/2020 and had low back pain and abdominal pain. She presented to the ED on 03/08/2020. Imaging performed on 03/10/2020 revealed acute compression fracture at T11 and a mild depression at the endplate of 624THL. Patient has a history of a vertebroplasty at T12 performed by Dr. Estanislado Perry in 2003. She complains of mid back pain that is worse with movement. She denies numbness, tingling, or weakness of her lower extremities. She was issues a lumbosacral corset earlier this admission.  Past Medical History:  Diagnosis Date  . Anemia   . Anxiety   . Arthritis    "all over" (04/23/2017)  . Cataract 2015   bilateral cat. extr.  . Depression   . GERD (gastroesophageal reflux disease)   . Heart murmur   . High cholesterol   . History of pelvic fracture 03/2016  . Hypertension   . Macular degeneration of both eyes   . Migraine    "started when I was in the 9th grade; lasted a couple years; started back in my 78's; had one daily right before OR" (04/23/2017)  . Osteoporosis   . Type II diabetes mellitus ([place])    "took Metformin for awhile; made me sick; diabetes is diet controlled" (04/23/2017)    Past Surgical History:  Procedure Laterality Date  . ABDOMINAL HYSTERECTOMY    . APPENDECTOMY    . BREAST CYST EXCISION Bilateral    "all benign"  . CARPAL TUNNEL RELEASE Right   . CATARACT EXTRACTION W/ INTRAOCULAR LENS  IMPLANT, BILATERAL Bilateral   . DILATION AND CURETTAGE OF UTERUS    . JOINT REPLACEMENT    . KYPHOPLASTY    . LAPAROSCOPIC CHOLECYSTECTOMY    . TONSILLECTOMY AND ADENOIDECTOMY     as child  . TOTAL KNEE ARTHROPLASTY Right 04/22/2017  . TOTAL KNEE ARTHROPLASTY Right 04/22/2017   Procedure: TOTAL KNEE ARTHROPLASTY;  Surgeon: Stacy Huger, MD;  Location: [facility];  Service: Orthopedics;  Laterality: Right;     Family History  Problem Relation Age of Onset  . Colon cancer Father   . Diabetes Father   . Pancreatic cancer Father   . Colon cancer Sister     Social History:  reports that she has never smoked. She has never used smokeless tobacco. She reports previous alcohol use. She reports that she does not use drugs.  Allergies:  Allergies  Allergen Reactions  . Sulfamethoxazole Diarrhea and Other (See Comments)    GI UPSET    Medications: I have reviewed the patient's current medications.  Results for orders placed or performed during the hospital encounter of 03/08/20 (from the past 48 hour(s))  CBC     Status: Abnormal   Collection Time: 03/09/20  3:37 AM  Result Value Ref Range   WBC 9.2 4.0 - 10.5 K/uL   RBC 3.56 (L) 3.87 - 5.11 MIL/uL   Hemoglobin 11.5 (L) 12.0 - 15.0 g/dL   HCT 34.7 (L) 36.0 - 46.0 %   MCV 97.5 80.0 - 100.0 fL   MCH 32.3 26.0 - 34.0 pg   MCHC 33.1 30.0 - 36.0 g/dL   RDW 13.3 11.5 - 15.5 %   Platelets 327 150 - 400 K/uL   nRBC 0.0 0.0 - 0.2 %    Comment: Performed at [facility], [place] [address] [place],  [address]  Basic metabolic panel     Status: Abnormal   Collection Time: 03/09/20  3:37 AM  Result Value Ref Range   Sodium 133 (L) 135 - 145 mmol/L   Potassium 3.6 3.5 - 5.1 mmol/L   Chloride 96 (L) 98 - 111 mmol/L   CO2 26 22 - 32 mmol/L   Glucose, Bld 122 (H) 70 - 99 mg/dL    Comment: Glucose reference range applies only to samples taken after fasting for at least 8 hours.   BUN 18 8 - 23 mg/dL   Creatinine, Ser 0.72 0.44 - 1.00 mg/dL   Calcium 8.6 (L) 8.9 - 10.3 mg/dL   GFR calc non Af Amer >60 >60 mL/min   GFR calc Af Amer >60 >60 mL/min   Anion gap 11 5 - 15    Comment: Performed at [facility] [address] [place], [place] [id number]  Basic metabolic panel     Status: Abnormal   Collection Time: 03/10/20  4:38 AM  Result Value Ref Range   Sodium 133 (L) 135 - 145 mmol/L   Potassium 4.2 3.5 - 5.1 mmol/L    Chloride 98 98 - 111 mmol/L   CO2 29 22 - 32 mmol/L   Glucose, Bld 129 (H) 70 - 99 mg/dL    Comment: Glucose reference range applies only to samples taken after fasting for at least 8 hours.   BUN 23 8 - 23 mg/dL   Creatinine, Ser 0.82 0.44 - 1.00 mg/dL   Calcium 8.3 (L) 8.9 - 10.3 mg/dL   GFR calc non Af Amer >60 >60 mL/min   GFR calc Af Amer >60 >60 mL/min   Anion gap 6 5 - 15    Comment: Performed at [facility] [address], [place], [place] [address]    MR THORACIC SPINE WO CONTRAST  Result Date: 03/10/2020 CLINICAL DATA:  Mechanical fall. Fractures of T11 and T12. History of T12 vertebroplasty EXAM: MRI THORACIC SPINE WITHOUT CONTRAST TECHNIQUE: Multiplanar, multisequence MR imaging of the thoracic spine was performed. No intravenous contrast was administered. COMPARISON:  CT 03/08/2020 FINDINGS: Alignment: No static listhesis. Slightly exaggerated thoracic kyphosis. Vertebrae: Complex fracture of the T11 vertebral body with a fluid-filled fracture cleft which is predominantly oriented in the transverse plane extending from the inferior endplate on the left to the junction of the vertebral body and right pedicle. The fracture margins appear partially corticated (series 6, images 4-8). There is mild residual marrow edema within the T11 vertebral body, suggesting an acute on chronic fracture with likely abnormal motion across the fracture site. Low T1/T2 signal changes within the inferior aspect of the T12 segment compatible with prior cement augmentation. Similar degree of superior endplate depression at 624THL subtle marrow edema is evident at the T12 superior endplate. Large intraosseous hemangioma within the L1 level. Remaining vertebral body heights are maintained. No additional fractures. Discogenic endplate marrow changes are noted throughout the spine including the visualized lower cervical and upper lumbar spines. No evidence to suggest discitis. Cord:  No cord signal abnormality  identified. Paraspinal and other soft tissues: Small right greater than left bilateral pleural effusions. Disc levels: No foraminal or canal stenosis at the T1-2 through T9-10 levels. T10-11: Mild canal stenosis secondary to posterior disc osteophyte complex and posterior element hypertrophy. Mild-to-moderate left foraminal stenosis. T11-T12: Moderate to severe canal stenosis without least moderate bilateral foraminal stenosis secondary to slight bony retropulsion of the inferior aspect of T11 and posterior element hypertrophy. T12-L1:  Bilateral facet arthropathy without foraminal or canal stenosis. IMPRESSION: 1. Complex T11 vertebral body fracture with a fluid-filled fracture cleft. Mild residual marrow edema within the T11 vertebral body, suggesting an acute on chronic fracture with likely abnormal motion across the fracture site. 2. Moderate to severe canal stenosis at T11-12 without cord signal abnormality. Bony retropulsion of the inferior aspect of T11 and posterior element hypertrophy contribute to moderate bilateral foraminal stenosis at this level. 3. Mild superior endplate depression at 624THL with subtle marrow edema at the superior endplate, which may represent a subtle acute on chronic fracture. There has been prior vertebroplasty at the T12 level. 4. Mild canal stenosis at T10-11. 5. Small right greater than left bilateral pleural effusions. Electronically Signed   By: Davina Poke D.O.   On: 03/10/2020 17:52    Review of Systems  Constitutional: Positive for activity change. Negative for chills and fever.  HENT: Negative.   Eyes: Negative.   Respiratory: Negative.   Cardiovascular: Negative.   Gastrointestinal: Positive for abdominal pain. Negative for constipation, diarrhea, nausea and vomiting.  Endocrine: Negative.   Genitourinary: Negative.   Musculoskeletal: Positive for arthralgias and back pain. Negative for neck pain.  Skin: Negative.   Neurological: Negative for weakness and  numbness.  Hematological: Negative.   Psychiatric/Behavioral: Negative.    Blood pressure (!) 168/70, pulse 86, temperature 98.7 F (37.1 C), temperature source Oral, resp. rate 14, SpO2 95 %. Physical Exam  Constitutional: She is oriented to person, place, and time. She appears well-developed and well-nourished.  HENT:  Head: Normocephalic and atraumatic.  Eyes: Pupils are equal, round, and reactive to light. Conjunctivae are normal.  Cardiovascular: Normal rate.  Respiratory: Effort normal. No respiratory distress.  GI: Soft. She exhibits no distension.  Musculoskeletal:        General: Normal range of motion.     Cervical back: Normal range of motion and neck supple.     Thoracic back: Pain and bony tenderness present.  Neurological: She is alert and oriented to person, place, and time. No cranial nerve deficit.  Skin: Skin is warm and dry.  Psychiatric: She has a normal mood and affect.    Assessment/Plan: Patient with an acute T11 compression fracture. Recommend TLSO brace when out of bed for comfort and pain management. Patient can follow up with Dr. Arnoldo Morale in 6 weeks as an outpatient.  [name] 03/10/2020, 8:36 PM

## 2020-03-10 NOTE — Progress Notes (Signed)
Orthopedic Tech Progress Note Patient Details:  [name] 07-02-1923 [id number] Called order to hanger Patient ID: [name], female   DOB: [birth year], 86 y.o.   MRN: [id number]   [name] 03/10/2020, 9:21 PM

## 2020-03-10 NOTE — TOC Progression Note (Signed)
Transition of Care [facility]) - Progression Note    Patient Details  Name: [name] MRN: [id number] Date of Birth: 1923-01-01  Transition of Care [facility]) CM/SW [place], RN Phone Number: 03/10/2020, 11:55 AM  Clinical Narrative:    Case management talked with the family regarding medicare choice for SNF placement.  The patient's family was given the list of accepting facilities and [facility] was their top choice.  I called the facility and the SNF short term rehab building is under Architect.  I gave the family the other accepting SNF facilities and I will followup this afternoon regarding choice.   Expected Discharge Plan: Skilled Nursing Facility Barriers to Discharge: (SNF placement)  Expected Discharge Plan and Services Expected Discharge Plan: [place]   Discharge Planning Services: CM Consult Post Acute Care Choice: [place], [place] Living arrangements for the past 2 months: Single Family Home                                       Social Determinants of Health (SDOH) Interventions    Readmission Risk Interventions No flowsheet data found.

## 2020-03-11 LAB — BASIC METABOLIC PANEL
Anion gap: 11 (ref 5–15)
BUN: 20 mg/dL (ref 8–23)
CO2: 25 mmol/L (ref 22–32)
Calcium: 8.3 mg/dL — ABNORMAL LOW (ref 8.9–10.3)
Chloride: 97 mmol/L — ABNORMAL LOW (ref 98–111)
Creatinine, Ser: 0.68 mg/dL (ref 0.44–1.00)
GFR calc Af Amer: >60 mL/min (ref >60)
GFR calc non Af Amer: >60 mL/min (ref >60)
Glucose, Bld: 105 mg/dL — ABNORMAL HIGH (ref 70–99)
Potassium: 3.9 mmol/L (ref 3.5–5.1)
Sodium: 133 mmol/L — ABNORMAL LOW (ref 135–145)

## 2020-03-11 MED ORDER — BISACODYL 10 MG RE SUPP
10.0000 mg | Freq: Once | RECTAL | Status: AC
  Administered 2020-03-11: 10 mg via RECTAL
  Filled 2020-03-11: qty 1

## 2020-03-11 MED ORDER — LIDOCAINE 5 % EX PTCH
1.0000 | MEDICATED_PATCH | CUTANEOUS | Status: DC
  Administered 2020-03-11 – 2020-03-15 (×5): 1 via TRANSDERMAL
  Filled 2020-03-11 (×5): qty 1

## 2020-03-11 NOTE — Plan of Care (Signed)
  Problem: Clinical Measurements: Goal: Will remain free from infection Outcome: Progressing Goal: Cardiovascular complication will be avoided Outcome: Progressing   Problem: Nutrition: Goal: Adequate nutrition will be maintained Outcome: Progressing

## 2020-03-11 NOTE — Progress Notes (Signed)
Physical Therapy Treatment Patient Details Name: [name] MRN: [id number] DOB: [birth year] Today's Date: 03/11/2020    History of Present Illness Pt is a 86 y/o female who presents s/p fall at home, down 2-3 hours. Pt sustained T11-T12 fractures and is being treated without surgical intervention at this time. PMH signifcant for DMII, osteoporosis, migraine, macular degeneration in both eyes, HTN, pelvic fracture 2017, heart murmur, R TKR, kyphoplasty.    PT Comments    Pt in bed upon arrival of PT, agreeable to session with focus on mobility and brace adjustment. The pt was able to recall log roll technique from prior session, but still requires minA to complete bed mobility. The pt also continues to need support in seated position to maintain balance (possibly due to pain) as well as significant assist to don brace, ensure fit, and complete stand from EOB. The pt was only able to complete short lateral steps to recliner today due to onset of pain with mobility, and will continue to benefit from skilled PT to further improve functional endurance, activity tolerance, stability, and power prior to d/c to improve safety and independence with mobility.     Follow Up Recommendations  SNF;Supervision/Assistance - 24 hour     Equipment Recommendations  None recommended by PT(defer to post acute)    Recommendations for Other Services       Precautions / Restrictions Precautions Precautions: Fall;Back Precaution Booklet Issued: Yes (comment) Precaution Comments: Back precautions for comfort Required Braces or Orthoses: Spinal Brace Spinal Brace: Thoracolumbosacral orthotic(TLSO when OOB (can be applied at EOB)) Restrictions Weight Bearing Restrictions: No    Mobility  Bed Mobility Overal bed mobility: Needs Assistance Bed Mobility: Rolling;Sidelying to Sit Rolling: Min assist Sidelying to sit: Min assist       General bed mobility comments: log roll for comfort, assist to get BLE  to EOB, minA to raise trunk from elevated HOB. minA to scoot hips forward, pt assisting  Transfers Overall transfer level: Needs assistance Equipment used: Rolling walker (2 wheeled) Transfers: Sit to/from Stand Sit to Stand: Min assist;From elevated surface         General transfer comment: minA to power up with RW. increased time and cues to achieve full stand. assist stabilizing RW  Ambulation/Gait Ambulation/Gait assistance: Min assist Gait Distance (Feet): 3 Feet Assistive device: Rolling walker (2 wheeled) Gait Pattern/deviations: Step-through pattern;Decreased stride length;Shuffle;Trunk flexed;Narrow base of support Gait velocity: Decreased Gait velocity interpretation: <1.31 ft/sec, indicative of household ambulator General Gait Details: minA to maintain upright with small lateral steps to recliner. pt limited by pain, difficulty donning brace   Stairs             Wheelchair Mobility    Modified Rankin (Stroke Patients Only)       Balance Overall balance assessment: Needs assistance Sitting-balance support: Feet supported;No upper extremity supported Sitting balance-Leahy Scale: Fair Sitting balance - Comments: pt with posterior lean, able to correct, daughter providing posterior support to reduce pain   Standing balance support: Bilateral upper extremity supported;During functional activity Standing balance-Leahy Scale: Poor Standing balance comment: Reliant on UE support                            Cognition Arousal/Alertness: Awake/alert Behavior During Therapy: WFL for tasks assessed/performed;Anxious Overall Cognitive Status: Impaired/Different from baseline Area of Impairment: Memory;Safety/judgement;Awareness  Memory: Decreased recall of precautions;Decreased short-term memory   Safety/Judgement: Decreased awareness of safety Awareness: Emergent   General Comments: Pt with some anxiety about movement.  Daughter present and indicated pt with some changes due to pain medication. Mild deficits noted      Exercises      General Comments General comments (skin integrity, edema, etc.): daughter present during session. difficulty donning TLSO prior to PT arrival, demonstrated appropriate fit and adjustment for pt daughter and RN      Pertinent Vitals/Pain Pain Assessment: Faces Faces Pain Scale: Hurts whole lot Pain Location: back/abdomen Pain Descriptors / Indicators: Grimacing;Guarding Pain Intervention(s): Limited activity within patient's tolerance;Monitored during session;Repositioned;Patient requesting pain meds-RN notified    Home Living                      Prior Function            PT Goals (current goals can now be found in the care plan section) Acute Rehab PT Goals Patient Stated Goal: Decrease pain; get better. Daughter is hopeful for return home after rehab PT Goal Formulation: With patient/family Time For Goal Achievement: 03/16/20 Potential to Achieve Goals: Good Progress towards PT goals: Progressing toward goals    Frequency    Min 3X/week      PT Plan Current plan remains appropriate    Co-evaluation              AM-PAC PT "6 Clicks" Mobility   Outcome Measure  Help needed turning from your back to your side while in a flat bed without using bedrails?: A Little Help needed moving from lying on your back to sitting on the side of a flat bed without using bedrails?: A Little Help needed moving to and from a bed to a chair (including a wheelchair)?: A Little Help needed standing up from a chair using your arms (e.g., wheelchair or bedside chair)?: A Little Help needed to walk in hospital room?: A Lot Help needed climbing 3-5 steps with a railing? : A Lot 6 Click Score: 16    End of Session Equipment Utilized During Treatment: Back brace;Gait belt Activity Tolerance: Patient tolerated treatment well;Patient limited by pain Patient  left: in chair;with call bell/phone within reach;with family/visitor present;with chair alarm set Nurse Communication: Mobility status PT Visit Diagnosis: Unsteadiness on feet (R26.81);Pain Pain - Right/Left: (central) Pain - part of body: (back)     Time: 8916-9450 PT Time Calculation (min) (ACUTE ONLY): 27 min  Charges:  $Gait Training: 8-22 mins $Therapeutic Activity: 8-22 mins                     Karma Ganja, PT, DPT   Acute Rehabilitation Department Pager #: [phone]   Otho Bellows 03/11/2020, 1:51 PM

## 2020-03-11 NOTE — Progress Notes (Signed)
PROGRESS NOTE    KINDSEY EBLIN  LTJ:[id number] DOB: [birth year] DOA: 03/08/2020 PCP: Myrlene Broker, MD   Chef Complaints: back pain  Brief Narrative: 86 y.o. female with medical history significant for type 2 diabetes, hypertension, hyperlipidemia, hypothyroidism, GERD, anxiety/depression, and back pain who presents to the ED for evaluation of abdominal and back pain.  Patient states she fell about 1 week ago.  She says she was outside her home try to get water from the faucet.  She was bent over and when she stood up she became lightheaded and fell down onto the cement/brick walkway.  She began to have lower back pain and was unable to get up on her own power.  She tried calling out to her husband but nobody was near enough to hear her.  She says she was down for about 2 hours until she was able to crawl to an area where a neighbor was passing by to help her.  She was taken to [facility] ED for further evaluation.  She was given fluids for dehydration and hydrocodone to help with pain control.  Since then patient has been having continued lower back and right flank pain which radiates into her abdomen.  She has been having difficulty ambulating even with the use of a walker and has significant trouble transferring into and out of bed.  She has had poor oral intake in the last few days and difficulty with her ADLs.  She denies any recent chest pain, dyspnea, fevers, chills, diaphoresis, or diarrhea.  She says she has had some recent constipation and used a suppository to find some relief.  She currently lives at home with her husband.  Daughter states that family lives close by and neighbors also check in on her.  Subjective: Alert awake.  Patient reports pain is better today. TSLO brace was applied last night per neurosurgery recommendation Patient reports she is passing gas, no bowel movement yet.  Assessment & Plan:  Mechanical fall with T11 -12 fracture with uncontrolled pain:CT  abdomen showed  T12 vertebral augmentation with complex likely acute fractures involving T11, T12.  Due to uncontrolled pain underwent MRI T-spine-that shows acute compression fracture at T11 and a mild depression at the endplate of [id number] was reviewed by neurosurgery Dr. Arsenio Loader PA and I personally discussed with them-no interventional procedures at this time and will need 6 weeks follow-up and continue pain control PT OT.  Continue oxycodone 5 mg as needed, add lidocaine patch, continue muscle relaxant, IV morphine for severe pain.   Constipation: Daughter reports no bowel for several days trying laxatives stool softener, gave bisacodyl PR this morning.  Can try enema if needed.  At risk of dehydration not eating well due to pain continue with enteral IV fluids..  Mild hyponatremia continue with IV fluids.    Controlled hypertension blood pressure running 190s likely due to pain.Home lisinopril was increased to 40 mg.  Added prn hydralazine continue to comanage pain.  If remains poorly controlled will add amlodipine.  T2DM: Diet controlled.  Depression: Mood is stable on paroxetine.  Hyperlipidemia associated with type 2 diabetes mellitus : Stable on Lipitor.  Hypothyroidism: Continue home Synthroid.  DVT prophylaxis:Heparin Code Status:DNR Family Communication: plan of care discussed with patient at bedside.  I had updated patient's daughter.   Status is: Admitted as observation Patient remains hospitalized due to ongoing severe pain needing IV opiates.Continue PT OT, lives with husband who is also elderly likely to skilled nursing facility.  Dispo:  The patient is from: Home              Anticipated d/c is to: Skilled nursing facility              Anticipated d/c date is: 1-2 days              Patient currently is not medically stable to d/c.  Once pain is optimized having bowel movement and blood pressure controlled- will discharge to skilled nursing facility if insurance  approved over the weekend  Diet Order            Diet regular Room service appropriate? No; Fluid consistency: Thin  Diet effective now              There is no height or weight on file to calculate BMI.  Consultants:see note  Procedures:see note Microbiology:see note  Medications: Scheduled Meds: . atorvastatin  20 mg Oral QHS  . docusate sodium  100 mg Oral BID  . heparin  5,000 Units Subcutaneous Q8H  . levothyroxine  25 mcg Oral Q0600  . lidocaine  1 patch Transdermal Q24H  . lisinopril  40 mg Oral Daily  . pantoprazole  40 mg Oral Daily  . PARoxetine  10 mg Oral QHS  . senna  1 tablet Oral BID   Continuous Infusions: . sodium chloride 30 mL/hr at 03/11/20 0527  . methocarbamol (ROBAXIN) IV      Antimicrobials: Anti-infectives (From admission, onward)   None       Objective: Vitals: Today's Vitals   03/11/20 0434 03/11/20 0620 03/11/20 0735 03/11/20 0836  BP: (!) 144/90   (!) 149/76  Pulse: 88   80  Resp: 14   15  Temp: 98.4 F (36.9 C)   98.7 F (37.1 C)  TempSrc: Oral     SpO2: 94%   95%  PainSc:  6  Asleep     Intake/Output Summary (Last 24 hours) at 03/11/2020 1153 Last data filed at 03/11/2020 0409 Gross per 24 hour  Intake 240 ml  Output 1600 ml  Net -1360 ml   There were no vitals filed for this visit. Weight change:    Intake/Output from previous day: 06/03 0701 - 06/04 0700 In: 360 [P.O.:360] Out: 2150 [Urine:2150] Intake/Output this shift: No intake/output data recorded.  Examination:   General exam: AAOx3, heard of hearing,NAD, weak appearing. HEENT:Oral mucosa moist, Ear/Nose WNL grossly, dentition normal. Respiratory system: bilaterally clear, no wheezing or crackles,no use of accessory muscle Cardiovascular system: S1 & S2 +, No JVD,. Gastrointestinal system: Abdomen soft, NT,ND, BS+ Nervous System:Alert, awake, moving extremities and grossly nonfocal Extremities: No edema, distal peripheral pulses palpable.  Skin: No  rashes,no icterus. MSK: Normal muscle bulk,tone, power Tender rt flank. TSLO +  Data Reviewed: I have personally reviewed following labs and imaging studies CBC: Recent Labs  Lab 03/08/20 1336 03/09/20 0337  WBC 8.6 9.2  NEUTROABS 6.6  --   HGB 11.9* 11.5*  HCT 35.8* 34.7*  MCV 98.6 97.5  PLT 316 884   Basic Metabolic Panel: Recent Labs  Lab 03/08/20 1336 03/09/20 0337 03/10/20 0438 03/11/20 0736  NA 132* 133* 133* 133*  K 4.2 3.6 4.2 3.9  CL 94* 96* 98 97*  CO2 24 26 29 25   GLUCOSE 122* 122* 129* 105*  BUN 22 18 23 20   CREATININE 0.71 0.72 0.82 0.68  CALCIUM 9.0 8.6* 8.3* 8.3*   GFR: CrCl cannot be calculated (Unknown ideal weight.). Liver Function Tests: Recent Labs  Lab 03/08/20 1336  AST 31  ALT 27  ALKPHOS 79  BILITOT 1.1  PROT 6.5  ALBUMIN 3.3*   Recent Labs  Lab 03/08/20 1336  LIPASE 40   No results for input(s): AMMONIA in the last 168 hours. Coagulation Profile: No results for input(s): INR, PROTIME in the last 168 hours. Cardiac Enzymes: No results for input(s): CKTOTAL, CKMB, CKMBINDEX, TROPONINI in the last 168 hours. BNP (last 3 results) No results for input(s): PROBNP in the last 8760 hours. HbA1C: No results for input(s): HGBA1C in the last 72 hours. CBG: No results for input(s): GLUCAP in the last 168 hours. Lipid Profile: No results for input(s): CHOL, HDL, LDLCALC, TRIG, CHOLHDL, LDLDIRECT in the last 72 hours. Thyroid Function Tests: No results for input(s): TSH, T4TOTAL, FREET4, T3FREE, THYROIDAB in the last 72 hours. Anemia Panel: No results for input(s): VITAMINB12, FOLATE, FERRITIN, TIBC, IRON, RETICCTPCT in the last 72 hours. Sepsis Labs: No results for input(s): PROCALCITON, LATICACIDVEN in the last 168 hours.  Recent Results (from the past 240 hour(s))  Urine culture     Status: None   Collection Time: 03/08/20  1:55 PM   Specimen: Urine, Random  Result Value Ref Range Status   Specimen Description URINE, RANDOM   Final   Special Requests NONE  Final   Culture   Final    NO GROWTH Performed at [facility], [address], [place], [place] [address]    Report Status 03/09/2020 FINAL  Final  SARS Coronavirus 2 by RT PCR (hospital order, performed in [facility] hospital lab) Nasopharyngeal Nasopharyngeal Swab     Status: None   Collection Time: 03/08/20  7:02 PM   Specimen: Nasopharyngeal Swab  Result Value Ref Range Status   SARS Coronavirus 2 NEGATIVE NEGATIVE Final    Comment: (NOTE) SARS-CoV-2 target nucleic acids are NOT DETECTED. The SARS-CoV-2 RNA is generally detectable in upper and lower respiratory specimens during the acute phase of infection. The lowest concentration of SARS-CoV-2 viral copies this assay can detect is 250 copies / mL. A negative result does not preclude SARS-CoV-2 infection and should not be used as the sole basis for treatment or other patient management decisions.  A negative result may occur with improper specimen collection / handling, submission of specimen other than nasopharyngeal swab, presence of viral mutation(s) within the areas targeted by this assay, and inadequate number of viral copies (<250 copies / mL). A negative result must be combined with clinical observations, patient history, and epidemiological information. Fact Sheet for Patients:   StrictlyIdeas.no Fact Sheet for Healthcare Providers: BankingDealers.co.za This test is not yet approved or cleared  by the [place] FDA and has been authorized for detection and/or diagnosis of SARS-CoV-2 by FDA under an Emergency Use Authorization (EUA).  This EUA will remain in effect (meaning this test can be used) for the duration of the COVID-19 declaration under Section 564(b)(1) of the Act, 21 U.S.C. section 360bbb-3(b)(1), unless the authorization is terminated or revoked sooner. Performed at [facility], [place] [address], [place],  [place] [address]       Radiology Studies: MR THORACIC SPINE WO CONTRAST  Result Date: 03/10/2020 CLINICAL DATA:  Mechanical fall. Fractures of T11 and T12. History of T12 vertebroplasty EXAM: MRI THORACIC SPINE WITHOUT CONTRAST TECHNIQUE: Multiplanar, multisequence MR imaging of the thoracic spine was performed. No intravenous contrast was administered. COMPARISON:  CT 03/08/2020 FINDINGS: Alignment: No static listhesis. Slightly exaggerated thoracic kyphosis. Vertebrae: Complex fracture of the  T11 vertebral body with a fluid-filled fracture cleft which is predominantly oriented in the transverse plane extending from the inferior endplate on the left to the junction of the vertebral body and right pedicle. The fracture margins appear partially corticated (series 6, images 4-8). There is mild residual marrow edema within the T11 vertebral body, suggesting an acute on chronic fracture with likely abnormal motion across the fracture site. Low T1/T2 signal changes within the inferior aspect of the T12 segment compatible with prior cement augmentation. Similar degree of superior endplate depression at N98 subtle marrow edema is evident at the T12 superior endplate. Large intraosseous hemangioma within the L1 level. Remaining vertebral body heights are maintained. No additional fractures. Discogenic endplate marrow changes are noted throughout the spine including the visualized lower cervical and upper lumbar spines. No evidence to suggest discitis. Cord:  No cord signal abnormality identified. Paraspinal and other soft tissues: Small right greater than left bilateral pleural effusions. Disc levels: No foraminal or canal stenosis at the T1-2 through T9-10 levels. T10-11: Mild canal stenosis secondary to posterior disc osteophyte complex and posterior element hypertrophy. Mild-to-moderate left foraminal stenosis. T11-T12: Moderate to severe canal stenosis without least moderate bilateral foraminal stenosis secondary to  slight bony retropulsion of the inferior aspect of T11 and posterior element hypertrophy. T12-L1: Bilateral facet arthropathy without foraminal or canal stenosis. IMPRESSION: 1. Complex T11 vertebral body fracture with a fluid-filled fracture cleft. Mild residual marrow edema within the T11 vertebral body, suggesting an acute on chronic fracture with likely abnormal motion across the fracture site. 2. Moderate to severe canal stenosis at T11-12 without cord signal abnormality. Bony retropulsion of the inferior aspect of T11 and posterior element hypertrophy contribute to moderate bilateral foraminal stenosis at this level. 3. Mild superior endplate depression at X21 with subtle marrow edema at the superior endplate, which may represent a subtle acute on chronic fracture. There has been prior vertebroplasty at the T12 level. 4. Mild canal stenosis at T10-11. 5. Small right greater than left bilateral pleural effusions. Electronically Signed   By: Davina Poke D.O.   On: 03/10/2020 17:52     LOS: 2 days   Antonieta Pert, MD [facility]  03/11/2020, 11:53 AM

## 2020-03-11 NOTE — TOC Progression Note (Signed)
Transition of Care [facility]) - Progression Note    Patient Details  Name: [name] MRN: [id number] Date of Birth: [birth year]  Transition of Care [name]) CM/SW [place], RN Phone Number: 03/11/2020, 5:00 PM  Clinical Narrative:    [employer] called and gave insurance auth approval for [facility] in [place].  I left a message with Dr. Lupita Leash to get a COVID tonight.  Case management to call the facility about transferring the patient tomorrow to [facility].  Insurance Statistician number is [id number].  Vedia Pereyra at [facility] notified to call CSW tomorrow for transfer.   Expected Discharge Plan: Skilled Nursing Facility Barriers to Discharge: (SNF placement)  Expected Discharge Plan and Services Expected Discharge Plan: [place]   Discharge Planning Services: CM Consult Post Acute Care Choice: [place], [place] Living arrangements for the past 2 months: Single Family Home                                       Social Determinants of Health (SDOH) Interventions    Readmission Risk Interventions No flowsheet data found.

## 2020-03-11 NOTE — TOC Progression Note (Signed)
Transition of Care [facility]) - Progression Note    Patient Details  Name: [name] MRN: [id number] Date of Birth: [birth year]  Transition of Care [facility]) CM/SW [place], RN Phone Number: 03/11/2020, 9:06 AM  Clinical Narrative:    Spoke with Cleda Mccreedy, daughter yesterday and the family has chosen [facility] in [place] for SNF placement - [facility] called and spoke with Vedia Pereyra and confirmed bed availability.  [facility] called and insurance auth started for the patient for placement.  Left number for [facility] to call [facility] in patient received auth over the weekend to expedite placement if needed.  Will continue to follow.   Expected Discharge Plan: Skilled Nursing Facility Barriers to Discharge: (SNF placement)  Expected Discharge Plan and Services Expected Discharge Plan: [place]   Discharge Planning Services: CM Consult Post Acute Care Choice: [place], [place] Living arrangements for the past 2 months: Single Family Home                                       Social Determinants of Health (SDOH) Interventions    Readmission Risk Interventions No flowsheet data found.

## 2020-03-11 NOTE — Plan of Care (Signed)
  Problem: Education: Goal: Knowledge of General Education information will improve Description: Including pain rating scale, medication(s)/side effects and non-pharmacologic comfort measures Outcome: Progressing   Problem: Health Behavior/Discharge Planning: Goal: Ability to manage health-related needs will improve Outcome: Progressing   Problem: Activity: Goal: Risk for activity intolerance will decrease Outcome: Progressing   Problem: Nutrition: Goal: Adequate nutrition will be maintained Outcome: Progressing   Problem: Elimination: Goal: Will not experience complications related to bowel motility Outcome: Progressing   Problem: Safety: Goal: Ability to remain free from injury will improve Outcome: Progressing   Problem: Skin Integrity: Goal: Risk for impaired skin integrity will decrease Outcome: Progressing   

## 2020-03-12 LAB — BASIC METABOLIC PANEL
Anion gap: 9 (ref 5–15)
BUN: 24 mg/dL — ABNORMAL HIGH (ref 8–23)
CO2: 26 mmol/L (ref 22–32)
Calcium: 8.1 mg/dL — ABNORMAL LOW (ref 8.9–10.3)
Chloride: 96 mmol/L — ABNORMAL LOW (ref 98–111)
Creatinine, Ser: 0.73 mg/dL (ref 0.44–1.00)
GFR calc Af Amer: >60 mL/min (ref >60)
GFR calc non Af Amer: >60 mL/min (ref >60)
Glucose, Bld: 114 mg/dL — ABNORMAL HIGH (ref 70–99)
Potassium: 3.9 mmol/L (ref 3.5–5.1)
Sodium: 131 mmol/L — ABNORMAL LOW (ref 135–145)

## 2020-03-12 LAB — SARS CORONAVIRUS 2 BY RT PCR (HOSPITAL ORDER, PERFORMED IN ~~LOC~~ HOSPITAL LAB): SARS Coronavirus 2: NEGATIVE

## 2020-03-12 MED ORDER — OXYCODONE HCL ER 10 MG PO T12A
10.0000 mg | EXTENDED_RELEASE_TABLET | Freq: Two times a day (BID) | ORAL | Status: DC
  Administered 2020-03-12 – 2020-03-13 (×3): 10 mg via ORAL
  Filled 2020-03-12 (×3): qty 1

## 2020-03-12 MED ORDER — POLYETHYLENE GLYCOL 3350 17 G PO PACK: 17.0000 g | PACK | Freq: Every day | ORAL | 0 refills | Status: DC | PRN

## 2020-03-12 MED ORDER — METHOCARBAMOL 500 MG PO TABS
500.0000 mg | ORAL_TABLET | Freq: Four times a day (QID) | ORAL | Status: DC | PRN
  Administered 2020-03-12 – 2020-03-15 (×10): 500 mg via ORAL
  Filled 2020-03-12 (×10): qty 1

## 2020-03-12 MED ORDER — DOCUSATE SODIUM 100 MG PO CAPS: 100.0000 mg | ORAL_CAPSULE | Freq: Two times a day (BID) | ORAL | 0 refills | Status: DC

## 2020-03-12 MED ORDER — LISINOPRIL 40 MG PO TABS: 20.0000 mg | ORAL_TABLET | Freq: Every day | ORAL | Status: DC

## 2020-03-12 MED ORDER — SENNA 8.6 MG PO TABS: 1.0000 | ORAL_TABLET | Freq: Two times a day (BID) | ORAL | 0 refills | Status: DC

## 2020-03-12 MED ORDER — PERCOCET 5-325 MG PO TABS: 1.0000 | ORAL_TABLET | Freq: Four times a day (QID) | ORAL | 0 refills | Status: DC | PRN

## 2020-03-12 MED ORDER — METHOCARBAMOL 500 MG PO TABS: 500.0000 mg | ORAL_TABLET | Freq: Four times a day (QID) | ORAL | Status: DC | PRN

## 2020-03-12 MED ORDER — AMLODIPINE BESYLATE 5 MG PO TABS: 5.0000 mg | ORAL_TABLET | Freq: Every day | ORAL | Status: DC

## 2020-03-12 MED ORDER — AMLODIPINE BESYLATE 5 MG PO TABS
5.0000 mg | ORAL_TABLET | Freq: Every day | ORAL | Status: DC
  Administered 2020-03-12: 5 mg via ORAL
  Filled 2020-03-12: qty 1

## 2020-03-12 NOTE — TOC Progression Note (Addendum)
Transition of Care [facility]) - Progression Note    Patient Details  Name: [name] MRN: [id number] Date of Birth: 26-Jul-1923  Transition of Care [facility]) CM/SW [place], [place] Phone Number: [phone] 03/12/2020, 11:31 AM  Clinical Narrative:     CSW informed authorization is back. CSW followed up with facility to check on bed readiness and had to leave message.  TOC team will continue to follow for discharge planning needs.   Expected Discharge Plan: Skilled Nursing Facility Barriers to Discharge: (SNF placement)  Expected Discharge Plan and Services Expected Discharge Plan: [place]   Discharge Planning Services: CM Consult Post Acute Care Choice: [place], [place] Living arrangements for the past 2 months: Single Family Home                                       Social Determinants of Health (SDOH) Interventions    Readmission Risk Interventions No flowsheet data found.

## 2020-03-12 NOTE — Progress Notes (Signed)
PROGRESS NOTE    [name]  VOH:[id number] DOB: 07/05/1923 DOA: 03/08/2020 PCP: Myrlene Broker, MD   Chef Complaints: back pain  Brief Narrative: 86 y.o. female with medical history significant for type 2 diabetes, hypertension, hyperlipidemia, hypothyroidism, GERD, anxiety/depression, and back pain who presents to the ED for evaluation of abdominal and back pain.  Patient states she fell about 1 week ago.  She says she was outside her home try to get water from the faucet.  She was bent over and when she stood up she became lightheaded and fell down onto the cement/brick walkway.  She began to have lower back pain and was unable to get up on her own power.  She tried calling out to her husband but nobody was near enough to hear her.  She says she was down for about 2 hours until she was able to crawl to an area where a neighbor was passing by to help her.  She was taken to [facility] ED for further evaluation.  She was given fluids for dehydration and hydrocodone to help with pain control.  Since then patient has been having continued lower back and right flank pain which radiates into her abdomen.  She has been having difficulty ambulating even with the use of a walker and has significant trouble transferring into and out of bed.  She has had poor oral intake in the last few days and difficulty with her ADLs.  She denies any recent chest pain, dyspnea, fevers, chills, diaphoresis, or diarrhea.  She says she has had some recent constipation and used a suppository to find some relief.  She currently lives at home with her husband.  Daughter states that family lives close by and neighbors also check in on her.  Subjective:  Patient having severe pain this morning.  Last night oxycodone did not help and needed IV morphine. She just received oxycodone and she feels overall well. Patient reports a lot of pain issues with mobility/position changing  Assessment & Plan:  Mechanical  fall with T11-T12 fracture with uncontrolled pain: CT abdomen showed  T12 vertebral augmentation with complex likely acute fractures involving T11, T12.  Due to uncontrolled pain underwent MRI T-spine-that shows acute compression fracture at T11 and a mild depression at the endplate of [id number] was reviewed by neurosurgery Dr. Arsenio Loader PA and I had discussed with them and will need outpatient follow-up in 6 weeks and continue pain control and TSLO.  Due to uncontrolled pain will add OxyContin 10 mg every 12 hours continue as needed meds, add p.o. muscle relaxant.  Continue lidocaine patch.  Constipation: No bowel for several days patient had a large bowel movement 6/4 S/P suppository bisacodyl as well as stool softener.  Continue current stool softener and MiraLAX.  Hyponatremia sodium is still at 131 continue gentle normal saline.  Patient not eating well at risk of dehydration BUN trending up.  Keep on IV fluids   Uncontrolled hypertension: BP was again in 190s, continue lisinopril at increased dose of 40 mg at amlodipine, continue pain control.  Monitor  T2DM: Diet controlled.  Depression: Mood is stable on paroxetine.  Hyperlipidemia associated with type 2 diabetes mellitus:Stable on Lipitor.  Hypothyroidism: Continue home Synthroid.  DVT prophylaxis:Heparin Code Status:DNR Family Communication: plan of care discussed with patient at bedside. I had updated patient's daughter.  Spoke w/ POA Dorian Pod and updated-they are currently struggling about making decision been has dementia and after care at home, they said they could hire  private nurse but at this time patient is needing a lot of assistance physical therapy and SNF has been recommended.  Status is: Admitted as observation Patient remains hospitalized due to ongoing severe pain needing IV opiates.Continue PT OT, lives with husband who is also elderly likely to skilled nursing facility.  Dispo: The patient is from:Home.              Anticipated d/c is PY:[facility] nursing facility.             Anticipated d/c date is:1 days.             Patient currently is not medically stable to d/c.Planning for discharge to skilled nursing facility likely in the next day if pain is better controlled.Continue to work with PT/OT.   Diet Order            Diet regular Room service appropriate? No; Fluid consistency: Thin  Diet effective now              There is no height or weight on file to calculate BMI.  Consultants:see note  Procedures:see note Microbiology:see note  Medications: Scheduled Meds:  amLODipine  5 mg Oral Daily   atorvastatin  20 mg Oral QHS   docusate sodium  100 mg Oral BID   heparin  5,000 Units Subcutaneous Q8H   levothyroxine  25 mcg Oral Q0600   lidocaine  1 patch Transdermal Q24H   lisinopril  40 mg Oral Daily   oxyCODONE  10 mg Oral Q12H   pantoprazole  40 mg Oral Daily   PARoxetine  10 mg Oral QHS   senna  1 tablet Oral BID   Continuous Infusions:  sodium chloride 30 mL/hr at 03/12/20 0610    Antimicrobials: Anti-infectives (From admission, onward)   None       Objective: Vitals: Today's Vitals   03/12/20 0817 03/12/20 0827 03/12/20 0927 03/12/20 1029  BP: (!) 190/80   (!) 141/65  Pulse: 95   80  Resp: 18     Temp: 98 F (36.7 C)     TempSrc:      SpO2: 97%   96%  PainSc:  10-Worst pain ever Asleep     Intake/Output Summary (Last 24 hours) at 03/12/2020 1151 Last data filed at 03/11/2020 1900 Gross per 24 hour  Intake 440.94 ml  Output --  Net 440.94 ml   There were no vitals filed for this visit. Weight change:    Intake/Output from previous day: 06/04 0701 - 06/05 0700 In: 440.9 [P.O.:120; I.V.:320.9] Out: -  Intake/Output this shift: No intake/output data recorded.  Examination:  General exam:AAOx3,weak frail elderly in pain, NAD, weak appearing. HEENT:Oral mucosa moist, Ear/Nose WNL grossly, dentition normal. Respiratory system: bilaterally  clear,no wheezing or crackles,no use of accessory muscle Cardiovascular system: S1 & S2 +, No JVD,. Gastrointestinal system: Abdomen soft, NT,ND, BS+ Nervous System:Alert, awake, moving extremities and grossly nonfocal Extremities: No edema, distal peripheral pulses palpable.  Skin: No rashes,no icterus. MSK: Normal muscle bulk,tone, power.  Data Reviewed: I have personally reviewed following labs and imaging studies CBC: Recent Labs  Lab 03/08/20 1336 03/09/20 0337  WBC 8.6 9.2  NEUTROABS 6.6  --   HGB 11.9* 11.5*  HCT 35.8* 34.7*  MCV 98.6 97.5  PLT 316 250   Basic Metabolic Panel: Recent Labs  Lab 03/08/20 1336 03/09/20 0337 03/10/20 0438 03/11/20 0736 03/12/20 0242  NA 132* 133* 133* 133* 131*  K 4.2 3.6 4.2 3.9 3.9  CL  94* 96* 98 97* 96*  CO2 24 26 29 25 26   GLUCOSE 122* 122* 129* 105* 114*  BUN 22 18 23 20  24*  CREATININE 0.71 0.72 0.82 0.68 0.73  CALCIUM 9.0 8.6* 8.3* 8.3* 8.1*   GFR: CrCl cannot be calculated (Unknown ideal weight.). Liver Function Tests: Recent Labs  Lab 03/08/20 1336  AST 31  ALT 27  ALKPHOS 79  BILITOT 1.1  PROT 6.5  ALBUMIN 3.3*   Recent Labs  Lab 03/08/20 1336  LIPASE 40   No results for input(s): AMMONIA in the last 168 hours. Coagulation Profile: No results for input(s): INR, PROTIME in the last 168 hours. Cardiac Enzymes: No results for input(s): CKTOTAL, CKMB, CKMBINDEX, TROPONINI in the last 168 hours. BNP (last 3 results) No results for input(s): PROBNP in the last 8760 hours. HbA1C: No results for input(s): HGBA1C in the last 72 hours. CBG: No results for input(s): GLUCAP in the last 168 hours. Lipid Profile: No results for input(s): CHOL, HDL, LDLCALC, TRIG, CHOLHDL, LDLDIRECT in the last 72 hours. Thyroid Function Tests: No results for input(s): TSH, T4TOTAL, FREET4, T3FREE, THYROIDAB in the last 72 hours. Anemia Panel: No results for input(s): VITAMINB12, FOLATE, FERRITIN, TIBC, IRON, RETICCTPCT in the  last 72 hours. Sepsis Labs: No results for input(s): PROCALCITON, LATICACIDVEN in the last 168 hours.  Recent Results (from the past 240 hour(s))  Urine culture     Status: None   Collection Time: 03/08/20  1:55 PM   Specimen: Urine, Random  Result Value Ref Range Status   Specimen Description URINE, RANDOM  Final   Special Requests NONE  Final   Culture   Final    NO GROWTH Performed at [facility], [address], [place], [place] [address]    Report Status 03/09/2020 FINAL  Final  SARS Coronavirus 2 by RT PCR (hospital order, performed in [facility] hospital lab) Nasopharyngeal Nasopharyngeal Swab     Status: None   Collection Time: 03/08/20  7:02 PM   Specimen: Nasopharyngeal Swab  Result Value Ref Range Status   SARS Coronavirus 2 NEGATIVE NEGATIVE Final    Comment: (NOTE) SARS-CoV-2 target nucleic acids are NOT DETECTED. The SARS-CoV-2 RNA is generally detectable in upper and lower respiratory specimens during the acute phase of infection. The lowest concentration of SARS-CoV-2 viral copies this assay can detect is 250 copies / mL. A negative result does not preclude SARS-CoV-2 infection and should not be used as the sole basis for treatment or other patient management decisions.  A negative result may occur with improper specimen collection / handling, submission of specimen other than nasopharyngeal swab, presence of viral mutation(s) within the areas targeted by this assay, and inadequate number of viral copies (<250 copies / mL). A negative result must be combined with clinical observations, patient history, and epidemiological information. Fact Sheet for Patients:   StrictlyIdeas.no Fact Sheet for Healthcare Providers: BankingDealers.co.za This test is not yet approved or cleared  by the [place] FDA and has been authorized for detection and/or diagnosis of SARS-CoV-2 by FDA under an Emergency Use  Authorization (EUA).  This EUA will remain in effect (meaning this test can be used) for the duration of the COVID-19 declaration under Section 564(b)(1) of the Act, 21 U.S.C. section 360bbb-3(b)(1), unless the authorization is terminated or revoked sooner. Performed at [facility], [place] [address], [place],  [address]   SARS Coronavirus 2 by RT PCR (hospital order, performed in [facility] hospital  lab) Nasopharyngeal Nasopharyngeal Swab     Status: None   Collection Time: 03/12/20  8:43 AM   Specimen: Nasopharyngeal Swab  Result Value Ref Range Status   SARS Coronavirus 2 NEGATIVE NEGATIVE Final    Comment: (NOTE) SARS-CoV-2 target nucleic acids are NOT DETECTED. The SARS-CoV-2 RNA is generally detectable in upper and lower respiratory specimens during the acute phase of infection. The lowest concentration of SARS-CoV-2 viral copies this assay can detect is 250 copies / mL. A negative result does not preclude SARS-CoV-2 infection and should not be used as the sole basis for treatment or other patient management decisions.  A negative result may occur with improper specimen collection / handling, submission of specimen other than nasopharyngeal swab, presence of viral mutation(s) within the areas targeted by this assay, and inadequate number of viral copies (<250 copies / mL). A negative result must be combined with clinical observations, patient history, and epidemiological information. Fact Sheet for Patients:   StrictlyIdeas.no Fact Sheet for Healthcare Providers: BankingDealers.co.za This test is not yet approved or cleared  by the [place] FDA and has been authorized for detection and/or diagnosis of SARS-CoV-2 by FDA under an Emergency Use Authorization (EUA).  This EUA will remain in effect (meaning this test can be used) for the duration of the COVID-19 declaration under Section 564(b)(1) of the Act, 21  U.S.C. section 360bbb-3(b)(1), unless the authorization is terminated or revoked sooner. Performed at [facility], [place] [address] [place], [place] [address]       Radiology Studies: MR THORACIC SPINE WO CONTRAST  Result Date: 03/10/2020 CLINICAL DATA:  Mechanical fall. Fractures of T11 and T12. History of T12 vertebroplasty EXAM: MRI THORACIC SPINE WITHOUT CONTRAST TECHNIQUE: Multiplanar, multisequence MR imaging of the thoracic spine was performed. No intravenous contrast was administered. COMPARISON:  CT 03/08/2020 FINDINGS: Alignment: No static listhesis. Slightly exaggerated thoracic kyphosis. Vertebrae: Complex fracture of the T11 vertebral body with a fluid-filled fracture cleft which is predominantly oriented in the transverse plane extending from the inferior endplate on the left to the junction of the vertebral body and right pedicle. The fracture margins appear partially corticated (series 6, images 4-8). There is mild residual marrow edema within the T11 vertebral body, suggesting an acute on chronic fracture with likely abnormal motion across the fracture site. Low T1/T2 signal changes within the inferior aspect of the T12 segment compatible with prior cement augmentation. Similar degree of superior endplate depression at L38 subtle marrow edema is evident at the T12 superior endplate. Large intraosseous hemangioma within the L1 level. Remaining vertebral body heights are maintained. No additional fractures. Discogenic endplate marrow changes are noted throughout the spine including the visualized lower cervical and upper lumbar spines. No evidence to suggest discitis. Cord:  No cord signal abnormality identified. Paraspinal and other soft tissues: Small right greater than left bilateral pleural effusions. Disc levels: No foraminal or canal stenosis at the T1-2 through T9-10 levels. T10-11: Mild canal stenosis secondary to posterior disc osteophyte complex and posterior element  hypertrophy. Mild-to-moderate left foraminal stenosis. T11-T12: Moderate to severe canal stenosis without least moderate bilateral foraminal stenosis secondary to slight bony retropulsion of the inferior aspect of T11 and posterior element hypertrophy. T12-L1: Bilateral facet arthropathy without foraminal or canal stenosis. IMPRESSION: 1. Complex T11 vertebral body fracture with a fluid-filled fracture cleft. Mild residual marrow edema within the T11 vertebral body, suggesting an acute on chronic fracture with likely abnormal motion across the fracture site. 2. Moderate to severe canal  stenosis at T11-12 without cord signal abnormality. Bony retropulsion of the inferior aspect of T11 and posterior element hypertrophy contribute to moderate bilateral foraminal stenosis at this level. 3. Mild superior endplate depression at Z61 with subtle marrow edema at the superior endplate, which may represent a subtle acute on chronic fracture. There has been prior vertebroplasty at the T12 level. 4. Mild canal stenosis at T10-11. 5. Small right greater than left bilateral pleural effusions. Electronically Signed   By: Davina Poke D.O.   On: 03/10/2020 17:52     LOS: 3 days   Antonieta Pert, MD [facility]  03/12/2020, 11:51 AM

## 2020-03-12 NOTE — Progress Notes (Signed)
1640 Assisted pt out of bed, mod x2 assist to the chair. Pain meds was given prior and getting back to bed.

## 2020-03-13 LAB — BASIC METABOLIC PANEL
Anion gap: 10 (ref 5–15)
BUN: 17 mg/dL (ref 8–23)
CO2: 26 mmol/L (ref 22–32)
Calcium: 8.3 mg/dL — ABNORMAL LOW (ref 8.9–10.3)
Chloride: 96 mmol/L — ABNORMAL LOW (ref 98–111)
Creatinine, Ser: 0.69 mg/dL (ref 0.44–1.00)
GFR calc Af Amer: >60 mL/min (ref >60)
GFR calc non Af Amer: >60 mL/min (ref >60)
Glucose, Bld: 118 mg/dL — ABNORMAL HIGH (ref 70–99)
Potassium: 4.1 mmol/L (ref 3.5–5.1)
Sodium: 132 mmol/L — ABNORMAL LOW (ref 135–145)

## 2020-03-13 MED ORDER — AMLODIPINE BESYLATE 2.5 MG PO TABS
2.5000 mg | ORAL_TABLET | Freq: Every day | ORAL | Status: DC
  Administered 2020-03-13 – 2020-03-14 (×2): 2.5 mg via ORAL
  Filled 2020-03-13 (×2): qty 1

## 2020-03-13 MED ORDER — OXYCODONE HCL ER 15 MG PO T12A
15.0000 mg | EXTENDED_RELEASE_TABLET | Freq: Two times a day (BID) | ORAL | Status: DC
  Administered 2020-03-13 – 2020-03-14 (×3): 15 mg via ORAL
  Filled 2020-03-13 (×4): qty 1

## 2020-03-13 MED ORDER — AMLODIPINE BESYLATE 2.5 MG PO TABS: 2.5000 mg | ORAL_TABLET | Freq: Every day | ORAL | Status: DC

## 2020-03-13 NOTE — Plan of Care (Signed)

## 2020-03-13 NOTE — Progress Notes (Signed)
PROGRESS NOTE    [name]  AST:[id number] DOB: 1922-10-17 DOA: 03/08/2020 PCP: Myrlene Broker, MD   Chef Complaints: Back pain  Brief Narrative: 86 y.o. female with medical history significant for type 2 diabetes, hypertension, hyperlipidemia, hypothyroidism, GERD, anxiety/depression, and back pain who presents to the ED for evaluation of abdominal and back pain.  Patient states she fell about 1 week ago.  She says she was outside her home try to get water from the faucet.  She was bent over and when she stood up she became lightheaded and fell down onto the cement/brick walkway.  She began to have lower back pain and was unable to get up on her own power.  She tried calling out to her husband but nobody was near enough to hear her.  She says she was down for about 2 hours until she was able to crawl to an area where a neighbor was passing by to help her.  She was taken to [facility] ED for further evaluation.  She was given fluids for dehydration and hydrocodone to help with pain control.  Since then patient has been having continued lower back and right flank pain which radiates into her abdomen.  She has been having difficulty ambulating even with the use of a walker and has significant trouble transferring into and out of bed.  She has had poor oral intake in the last few days and difficulty with her ADLs.  She denies any recent chest pain, dyspnea, fevers, chills, diaphoresis, or diarrhea.  She says she has had some recent constipation and used a suppository to find some relief.  She currently lives at home with her husband.  Daughter states that family lives close by and neighbors also check in on her. Patient was admitted, pain medication adjusted with uncontrolled pain MRI was done reviewed by neurosurgery and no intervention recommended advised pain control PT OT and follow-up with neurosurgery as outpatient.  Subjective:  Daughter at the bedside.  Patient reports  uncontrolled pain.  Difficulty with mobility. Needed IV morphine last night, tolerating OxyContin 10 mg without any drowsiness. " Give me something to knock out"   Assessment & Plan:  Mechanical fall with T11 -T12 fractures with uncontrolled pain:CT abdomen showed  T12 vertebral augmentation with complex likely acute fractures involving T11, T12.  Then underwent MRI T-spine due to ongoing pain- shows acute compression fracture at T11 and a mild depression at the endplate of L89-QJJH was reviewed by neurosurgery Dr. Arsenio Loader PA and I had discussed with them and will need outpatient follow-up in 6 weeks and continue pain control and TSLO.  OxyContin was restarted 10 mg twice daily will increase to 50 mg twice daily, continue oxycodone 5 mg 6 hours as needed and also IV morphine for breakthrough pain.  Continue lidocaine patch, Robaxin.  Continue PT OT  Constipation: No bowel for several days patient had a large bowel movement 6/4 S/P suppository bisacodyl as well as stool softener.  Continue aggressive bowel regimen  Hyponatremia sodium improving at 132, due to poor intake continue IV fluids.Patient not eating well at risk of dehydration BUN trending up.  Uncontrolled hypertension likely in the setting of pain.  BP is improving continue increase dose of lisinopril 40 mg daily, also on amlodipine, adjusted 2.5 mg daily and slowly uptitrate.  Monitor and adjust medications.  T2DM: Diet controlled.  Depression: Mood is stable on paroxetine.  Hyperlipidemia associated with type 2 diabetes mellitus:Stable on Lipitor.  Hypothyroidism: Continue home  Synthroid.  DVT prophylaxis:Heparin Code Status:DNR Family Communication: Plan of care discussed with patient's daughter at bedside again today.  They are looking into skilled nursing facility.  Family wondering about [facility] as Public affairs consultant alerted.   Status is: Admitted as observation Patient remains hospitalized due to ongoing  severe pain and need for pain management adjustment of medication, unsafe discharge to home and looking into skilled nursing facility.    Dispo: The patient is from:Home.             Anticipated d/c is [facility] nursing facility.             Anticipated d/c date is:1 days.             Patient currently is not medically stable to d/c.  Planning for a skilled nursing facility once pain is controlled and SNF available.  Social worker alerted regarding family wishes for [facility].  Diet Order            Diet regular Room service appropriate? No; Fluid consistency: Thin  Diet effective now              There is no height or weight on file to calculate BMI.  Consultants:see note  Procedures:see note Microbiology:see note  Medications: Scheduled Meds: . amLODipine  2.5 mg Oral Daily  . atorvastatin  20 mg Oral QHS  . docusate sodium  100 mg Oral BID  . heparin  5,000 Units Subcutaneous Q8H  . levothyroxine  25 mcg Oral Q0600  . lidocaine  1 patch Transdermal Q24H  . lisinopril  40 mg Oral Daily  . oxyCODONE  15 mg Oral Q12H  . pantoprazole  40 mg Oral Daily  . PARoxetine  10 mg Oral QHS  . senna  1 tablet Oral BID   Continuous Infusions: . sodium chloride 30 mL/hr at 03/12/20 4098    Antimicrobials: Anti-infectives (From admission, onward)   None       Objective: Vitals: Today's Vitals   03/13/20 0719 03/13/20 0801 03/13/20 0922 03/13/20 1300  BP:  (!) 178/84    Pulse:  85    Resp:  16    Temp:  98.2 F (36.8 C)    TempSrc:  Oral    SpO2:  95%    PainSc: Asleep  10-Worst pain ever 10-Worst pain ever    Intake/Output Summary (Last 24 hours) at 03/13/2020 1354 Last data filed at 03/13/2020 0500 Gross per 24 hour  Intake 480 ml  Output 2400 ml  Net -1920 ml   There were no vitals filed for this visit. Weight change:    Intake/Output from previous day: 06/05 0701 - 06/06 0700 In: 720 [P.O.:720] Out: 2400 [Urine:2400] Intake/Output this shift: No  intake/output data recorded.  Examination:  General exam: Mildly sleepy, in pain, elderly, appears frail.   HEENT:Oral mucosa moist, Ear/Nose WNL grossly, dentition normal. Respiratory system: bilaterally clear,no wheezing or crackles,no use of accessory muscle Cardiovascular system: S1 & S2 +, No JVD,. Gastrointestinal system: Abdomen soft, Tender on sides,ND, BS+ Nervous System:Alert, awake, moving extremities and grossly nonfocal Extremities: No edema, distal peripheral pulses palpable.  Skin: No rashes,no icterus. MSK: Normal muscle bulk,tone, power. Moving lower extremities well, tenderness on the flank lower back  Data Reviewed: I have personally reviewed following labs and imaging studies CBC: Recent Labs  Lab 03/08/20 1336 03/09/20 0337  WBC 8.6 9.2  NEUTROABS 6.6  --   HGB 11.9* 11.5*  HCT 35.8* 34.7*  MCV 98.6 97.5  PLT  316 672   Basic Metabolic Panel: Recent Labs  Lab 03/09/20 0337 03/10/20 0438 03/11/20 0736 03/12/20 0242 03/13/20 0311  NA 133* 133* 133* 131* 132*  K 3.6 4.2 3.9 3.9 4.1  CL 96* 98 97* 96* 96*  CO2 26 29 25 26 26   GLUCOSE 122* 129* 105* 114* 118*  BUN 18 23 20  24* 17  CREATININE 0.72 0.82 0.68 0.73 0.69  CALCIUM 8.6* 8.3* 8.3* 8.1* 8.3*   GFR: CrCl cannot be calculated (Unknown ideal weight.). Liver Function Tests: Recent Labs  Lab 03/08/20 1336  AST 31  ALT 27  ALKPHOS 79  BILITOT 1.1  PROT 6.5  ALBUMIN 3.3*   Recent Labs  Lab 03/08/20 1336  LIPASE 40   No results for input(s): AMMONIA in the last 168 hours. Coagulation Profile: No results for input(s): INR, PROTIME in the last 168 hours. Cardiac Enzymes: No results for input(s): CKTOTAL, CKMB, CKMBINDEX, TROPONINI in the last 168 hours. BNP (last 3 results) No results for input(s): PROBNP in the last 8760 hours. HbA1C: No results for input(s): HGBA1C in the last 72 hours. CBG: No results for input(s): GLUCAP in the last 168 hours. Lipid Profile: No results for  input(s): CHOL, HDL, LDLCALC, TRIG, CHOLHDL, LDLDIRECT in the last 72 hours. Thyroid Function Tests: No results for input(s): TSH, T4TOTAL, FREET4, T3FREE, THYROIDAB in the last 72 hours. Anemia Panel: No results for input(s): VITAMINB12, FOLATE, FERRITIN, TIBC, IRON, RETICCTPCT in the last 72 hours. Sepsis Labs: No results for input(s): PROCALCITON, LATICACIDVEN in the last 168 hours.  Recent Results (from the past 240 hour(s))  Urine culture     Status: None   Collection Time: 03/08/20  1:55 PM   Specimen: Urine, Random  Result Value Ref Range Status   Specimen Description URINE, RANDOM  Final   Special Requests NONE  Final   Culture   Final    NO GROWTH Performed at [facility], [address], [place], [place] [address]    Report Status 03/09/2020 FINAL  Final  SARS Coronavirus 2 by RT PCR (hospital order, performed in [facility] hospital lab) Nasopharyngeal Nasopharyngeal Swab     Status: None   Collection Time: 03/08/20  7:02 PM   Specimen: Nasopharyngeal Swab  Result Value Ref Range Status   SARS Coronavirus 2 NEGATIVE NEGATIVE Final    Comment: (NOTE) SARS-CoV-2 target nucleic acids are NOT DETECTED. The SARS-CoV-2 RNA is generally detectable in upper and lower respiratory specimens during the acute phase of infection. The lowest concentration of SARS-CoV-2 viral copies this assay can detect is 250 copies / mL. A negative result does not preclude SARS-CoV-2 infection and should not be used as the sole basis for treatment or other patient management decisions.  A negative result may occur with improper specimen collection / handling, submission of specimen other than nasopharyngeal swab, presence of viral mutation(s) within the areas targeted by this assay, and inadequate number of viral copies (<250 copies / mL). A negative result must be combined with clinical observations, patient history, and epidemiological information. Fact Sheet for Patients:     StrictlyIdeas.no Fact Sheet for Healthcare Providers: BankingDealers.co.za This test is not yet approved or cleared  by the [place] FDA and has been authorized for detection and/or diagnosis of SARS-CoV-2 by FDA under an Emergency Use Authorization (EUA).  This EUA will remain in effect (meaning this test can be used) for the duration of the COVID-19 declaration under Section 564(b)(1) of the Act, 21 U.S.C.  section 360bbb-3(b)(1), unless the authorization is terminated or revoked sooner. Performed at [facility], [place] [address], [place], [place] [address]   SARS Coronavirus 2 by RT PCR (hospital order, performed in [facility] hospital lab) Nasopharyngeal Nasopharyngeal Swab     Status: None   Collection Time: 03/12/20  8:43 AM   Specimen: Nasopharyngeal Swab  Result Value Ref Range Status   SARS Coronavirus 2 NEGATIVE NEGATIVE Final    Comment: (NOTE) SARS-CoV-2 target nucleic acids are NOT DETECTED. The SARS-CoV-2 RNA is generally detectable in upper and lower respiratory specimens during the acute phase of infection. The lowest concentration of SARS-CoV-2 viral copies this assay can detect is 250 copies / mL. A negative result does not preclude SARS-CoV-2 infection and should not be used as the sole basis for treatment or other patient management decisions.  A negative result may occur with improper specimen collection / handling, submission of specimen other than nasopharyngeal swab, presence of viral mutation(s) within the areas targeted by this assay, and inadequate number of viral copies (<250 copies / mL). A negative result must be combined with clinical observations, patient history, and epidemiological information. Fact Sheet for Patients:   StrictlyIdeas.no Fact Sheet for Healthcare Providers: BankingDealers.co.za This test is not yet approved or cleared  by the  [place] FDA and has been authorized for detection and/or diagnosis of SARS-CoV-2 by FDA under an Emergency Use Authorization (EUA).  This EUA will remain in effect (meaning this test can be used) for the duration of the COVID-19 declaration under Section 564(b)(1) of the Act, 21 U.S.C. section 360bbb-3(b)(1), unless the authorization is terminated or revoked sooner. Performed at [facility], [place] [address], [place], [place] [address]       Radiology Studies: No results found.   LOS: 4 days   Antonieta Pert, MD [facility]  03/13/2020, 1:54 PM

## 2020-03-13 NOTE — Plan of Care (Signed)

## 2020-03-14 LAB — BASIC METABOLIC PANEL
Anion gap: 10 (ref 5–15)
BUN: 18 mg/dL (ref 8–23)
CO2: 25 mmol/L (ref 22–32)
Calcium: 8.4 mg/dL — ABNORMAL LOW (ref 8.9–10.3)
Chloride: 97 mmol/L — ABNORMAL LOW (ref 98–111)
Creatinine, Ser: 0.75 mg/dL (ref 0.44–1.00)
GFR calc Af Amer: >60 mL/min (ref >60)
GFR calc non Af Amer: >60 mL/min (ref >60)
Glucose, Bld: 120 mg/dL — ABNORMAL HIGH (ref 70–99)
Potassium: 4.7 mmol/L (ref 3.5–5.1)
Sodium: 132 mmol/L — ABNORMAL LOW (ref 135–145)

## 2020-03-14 MED ORDER — LIDOCAINE 5 % EX PTCH: 1.0000 | MEDICATED_PATCH | CUTANEOUS | 0 refills | Status: DC

## 2020-03-14 MED ORDER — LISINOPRIL 40 MG PO TABS: 40.0000 mg | ORAL_TABLET | Freq: Every day | ORAL | Status: DC

## 2020-03-14 MED ORDER — PERCOCET 5-325 MG PO TABS: 1.0000 | ORAL_TABLET | Freq: Four times a day (QID) | ORAL | 0 refills | Status: DC | PRN

## 2020-03-14 MED ORDER — LEVOTHYROXINE SODIUM 25 MCG PO TABS: 25.0000 ug | ORAL_TABLET | Freq: Every day | ORAL | Status: DC

## 2020-03-14 MED ORDER — OXYCODONE HCL ER 15 MG PO T12A: 15.0000 mg | EXTENDED_RELEASE_TABLET | Freq: Two times a day (BID) | ORAL | 0 refills | Status: DC

## 2020-03-14 NOTE — Progress Notes (Signed)
Physical Therapy Treatment Patient Details Name: [name] MRN: [id number] DOB: 1923/07/07 Today's Date: 03/14/2020    History of Present Illness Pt is a 86 y/o female who presents s/p fall at home, down 2-3 hours. Pt sustained T11-T12 fractures and is being treated without surgical intervention at this time. PMH signifcant for DMII, osteoporosis, migraine, macular degeneration in both eyes, HTN, pelvic fracture 2017, heart murmur, R TKR, kyphoplasty.    PT Comments    Pt progressing slowly towards physical therapy goals. Pt appeared flat and with slow processing this session. Therapist assisted with transfer to/from EOB, and RN present to assist with positioning in the bed at end of session. Pt asking to lay back down and states she cannot stand up due to pain when she sat up. SNF level follow-up remains appropriate. Will continue to follow.    Follow Up Recommendations  SNF;Supervision/Assistance - 24 hour     Equipment Recommendations  None recommended by PT (TBD by next venue of care)    Recommendations for Other Services       Precautions / Restrictions Precautions Precautions: Fall;Back Precaution Booklet Issued: Yes (comment) Precaution Comments: Back precautions for comfort Required Braces or Orthoses: Spinal Brace Spinal Brace: Thoracolumbosacral orthotic(TLSO when OOB (can be applied at EOB)) Restrictions Weight Bearing Restrictions: No    Mobility  Bed Mobility Overal bed mobility: Needs Assistance Bed Mobility: Rolling;Sidelying to Sit;Sit to Sidelying Rolling: Mod assist Sidelying to sit: Mod assist     Sit to sidelying: Mod assist General bed mobility comments: Multimodal cueing required to transition to EOB utilizing log roll technique. Pt reporting increased pain with movement, and noted pt bracing herself with UE's while sitting EOB. Did not feel comfortable donning brace alone, as pt appeared unsteady and needed hands on guarding throughout static  sitting.   Transfers                 General transfer comment: We were not able to progress to OOB transfers at this time due to pain. Pt requesting to lay back down 2 pain after ~30 seconds at EOB.   Ambulation/Gait             General Gait Details: Unable to attempt   Stairs             Wheelchair Mobility    Modified Rankin (Stroke Patients Only)       Balance Overall balance assessment: Needs assistance Sitting-balance support: Feet supported;No upper extremity supported Sitting balance-Leahy Scale: Poor Sitting balance - Comments: Unable to sit without BUE support at this time                                    Cognition Arousal/Alertness: Awake/alert Behavior During Therapy: [facility] for tasks assessed/performed;Anxious Overall Cognitive Status: Impaired/Different from baseline Area of Impairment: Memory;Safety/judgement;Awareness;Problem solving;Following commands;Attention                   Current Attention Level: Sustained Memory: Decreased recall of precautions;Decreased short-term memory Following Commands: Follows one step commands inconsistently;Follows one step commands with increased time Safety/Judgement: Decreased awareness of safety Awareness: Intellectual Problem Solving: Slow processing;Decreased initiation;Difficulty sequencing;Requires verbal cues;Requires tactile cues General Comments: Pt flat this session with increased time required to respond to basic questions.       Exercises      General Comments        Pertinent Vitals/Pain Pain Assessment: Faces  Faces Pain Scale: Hurts whole lot Pain Location: back/abdomen Pain Descriptors / Indicators: Grimacing;Guarding Pain Intervention(s): Limited activity within patient's tolerance;Monitored during session;Repositioned    Home Living                      Prior Function            PT Goals (current goals can now be found in the care plan  section) Acute Rehab PT Goals Patient Stated Goal: Decrease pain; get better. Daughter is hopeful for return home after rehab PT Goal Formulation: With patient Time For Goal Achievement: 03/16/20 Potential to Achieve Goals: Good Progress towards PT goals: Progressing toward goals    Frequency    Min 3X/week      PT Plan Current plan remains appropriate    Co-evaluation              AM-PAC PT "6 Clicks" Mobility   Outcome Measure  Help needed turning from your back to your side while in a flat bed without using bedrails?: A Little Help needed moving from lying on your back to sitting on the side of a flat bed without using bedrails?: A Little Help needed moving to and from a bed to a chair (including a wheelchair)?: A Lot Help needed standing up from a chair using your arms (e.g., wheelchair or bedside chair)?: A Lot Help needed to walk in hospital room?: A Lot Help needed climbing 3-5 steps with a railing? : Total 6 Click Score: 13    End of Session   Activity Tolerance: Patient limited by pain Patient left: in bed;with call bell/phone within reach;with bed alarm set Nurse Communication: Mobility status;Other (comment)(Pt had a pill in her mouth upon PT arrival) PT Visit Diagnosis: Unsteadiness on feet (R26.81);Pain Pain - Right/Left: (central) Pain - part of body: (back)     Time: 7048-8891 PT Time Calculation (min) (ACUTE ONLY): 22 min  Charges:  $Therapeutic Activity: 8-22 mins                     Rolinda Roan, PT, DPT Acute Rehabilitation Services Pager: [phone] Office: [phone]    Thelma Comp 03/14/2020, 12:55 PM

## 2020-03-14 NOTE — Discharge Summary (Signed)
Physician Discharge Summary  CORIANNE BUCCELLATO JOI:[id number] DOB: October 13, 1922 DOA: 03/08/2020  PCP: Myrlene Broker, MD  Admit date: 03/08/2020 Discharge date: 03/14/2020  Admitted From: home Disposition:  SNF  Recommendations for Outpatient Follow-up:  1. Follow up with PCP in 1-2 weeks 2. Please obtain BMP/CBC in one week 3. Please follow up on the following pending results:  Home Health:NO  Equipment/Devices: NONE  Discharge Condition: Stable Code Status: DNR Diet recommendation:  Diet Order            Diet - low sodium heart healthy        Diet regular Room service appropriate? No; Fluid consistency: Thin  Diet effective now               Brief/Interim Summary:  86 y.o.femalewith medical history significant fortype 2 diabetes, hypertension, hyperlipidemia, hypothyroidism, GERD, anxiety/depression, and back pain who presents to the ED for evaluation of abdominal and back pain.Patient states she fell about 1 week ago. She says she was outside her home try to get water from the faucet. She was bent over and when she stood up she became lightheaded and fell down onto the cement/brick walkway. She began to have lower back pain and was unable to get up on her own power. She tried calling out to her husband but nobody was near enough to hear her. She says she was down for about 2 hours until she was able to crawl to an area where a neighbor was passing by to help her.  She was taken to [facility] for further evaluation. She was given fluids for dehydration and hydrocodone to help with pain control. Since then patient has been having continued lower back and right flank pain which radiates into her abdomen. She has been having difficulty ambulating even with the use of a walker and has significant trouble transferring into and out of bed. She has had poor oral intake in the last few days and difficulty with her ADLs. She denies any recent chest pain, dyspnea, fevers,  chills, diaphoresis, or diarrhea. She says she has had some recent constipation and useda suppository to find some relief.  She currently lives at home with her husband. Daughter states that family lives close by and neighbors also check in on her. Patient was admitted, pain medication adjusted with uncontrolled pain MRI was done reviewed by neurosurgery and no intervention recommended advised pain control PT OT and follow-up with neurosurgery as outpatient. Patient will start on OxyContin dose slowly uptitrate, tolerating well no sedation. Pain is now relatively well controlled on OxyContin from twice daily along with as needed oxygen/Percocet. She will need to continue to use a low while ambulating and continue skilled nursing facility rehab and then follow-up with neurosurgery Dr. Arnoldo Morale.  Discharge Diagnoses:   Acute compression fracture of T11 and mild depression of the endplate of O70 with uncontrolled pain:She had CT abdomen  T12 vertebral augmentation with complex likely acute fractures involving T11, T12.Then underwent MRI T-spine due to ongoing pain- shows acute compression fracture at T11 and a mild depression at the endplate of [id number] was reviewed by neurosurgery Dr. Arsenio Loader PA and I had discussed with them and will need outpatient follow-up in 6 weeks and continue pain control and TSLO. OxyContin was restarted 10 mg twice daily will increase to 15 mg twice daily- pain controlled alogn with oxy- will d/c on percocet, cont laxatives.Cont lidocaine patch and Robaxin. Continue PT OT   Constipation: Patient will need to continue  on bowel regimen and as needed bisacodyl suppository.   Hyponatremia: Encourage oral hydration.   Uncontrolled hypertension likely in the setting of pain.  BP stable she is on increased dose of lisinopril 40 mg with also received amlodipine since pain is improving blood pressure also improving so will avoid amlodipine for now.  Can consider  amlodipine 2.5 mg at the facility if blood pressure gets uncontrolled and 170s to 190s   T2DM: Diet controlled.  Depression: Mood is stable on paroxetine.  Hyperlipidemia associated with type 2 diabetes mellitus:Stable on Lipitor.  Hypothyroidism: Continue home Synthroid.  TSH in 4 weeks  Consults:  neurosurgery  Subjective: Patient is alert awake oriented this morning pain is controlled.  Not in acute distress. Nontender to lower back on palpation.  Discharge Exam: Vitals:   03/14/20 0428 03/14/20 0744  BP: (!) 130/52 (!) 117/48  Pulse: 95 86  Resp: 14 19  Temp: (!) 97.5 F (36.4 C) 98 F (36.7 C)  SpO2: 90% 90%   General: Pt is alert, awake, not in acute distress Cardiovascular: RRR, S1/S2 +, no rubs, no gallops Respiratory: CTA bilaterally, no wheezing, no rhonchi Abdominal: Soft, NT, ND, bowel sounds + Extremities: no edema, no cyanosis  Discharge Instructions  Discharge Instructions    Diet - low sodium heart healthy   Complete by: As directed    Discharge instructions   Complete by: As directed    Recommend TLSO brace when out of bed for comfort and pain management.  Please follow up with Dr. Arnoldo Morale in 6 weeks as an outpatient  Patient was started on OxyContin for pain control along with Percocet/oxygen: Please hold this narcotics  if patient is excessively sleepy or has low blood pressure or low respiration rate.  Please call call MD or return to ER for similar or worsening recurring problem that brought you to hospital or if any fever,nausea/vomiting,abdominal pain, uncontrolled pain, chest pain,  shortness of breath or any other alarming symptoms.  Please follow-up your doctor as instructed in a week time and call the office for appointment.  Please avoid alcohol, smoking, or any other illicit substance and maintain healthy habits including taking your regular medications as prescribed.  You were cared for by a hospitalist during your hospital stay.  If you have any questions about your discharge medications or the care you received while you were in the hospital after you are discharged, you can call the unit and ask to speak with the hospitalist on call if the hospitalist that took care of you is not available.  Once you are discharged, your primary care physician will handle any further medical issues. Please note that NO REFILLS for any discharge medications will be authorized once you are discharged, as it is imperative that you return to your primary care physician (or establish a relationship with a primary care physician if you do not have one) for your aftercare needs so that they can reassess your need for medications and monitor your lab values   Increase activity slowly   Complete by: As directed      Allergies as of 03/14/2020      Reactions   Sulfamethoxazole Diarrhea, Other (See Comments)   GI UPSET      Medication List    STOP taking these medications   traMADol 50 MG tablet Commonly known as: ULTRAM     TAKE these medications   acetaminophen 650 MG CR tablet Commonly known as: TYLENOL Take 650 mg by mouth every 8 (  eight) hours as needed for pain.   atorvastatin 20 MG tablet Commonly known as: LIPITOR Take 20 mg by mouth at bedtime.   CALCIUM CITRATE PO Take 1 tablet by mouth daily.   COD LIVER OIL PO Take 1 tablet by mouth daily.   COSAMIN DS PO Take 1 tablet by mouth 2 (two) times daily. 1000mg  daily   docusate sodium 100 MG capsule Commonly known as: COLACE Take 1 capsule (100 mg total) by mouth 2 (two) times daily.   ICAPS PO Take 1 tablet by mouth 2 (two) times daily.   IRON 27 PO Take 1 tablet by mouth at bedtime.   levothyroxine 25 MCG tablet Commonly known as: SYNTHROID Take 1 tablet (25 mcg total) by mouth daily at 6 (six) AM. Start taking on: March 15, 2020   lidocaine 5 % Commonly known as: LIDODERM Place 1 patch onto the skin daily. Remove & Discard patch within 12 hours or as  directed by MD Start taking on: March 15, 2020   lisinopril 40 MG tablet Commonly known as: ZESTRIL Take 1 tablet (40 mg total) by mouth daily. What changed:   medication strength  how much to take   Magnesium 250 MG Tabs Take 250 mg by mouth at bedtime.   meloxicam 7.5 MG tablet Commonly known as: MOBIC Take 7.5 mg by mouth daily.   methocarbamol 500 MG tablet Commonly known as: ROBAXIN Take 1 tablet (500 mg total) by mouth every 6 (six) hours as needed for muscle spasms.   omeprazole 40 MG capsule Commonly known as: PRILOSEC Take 40 mg by mouth daily.   ondansetron 4 MG tablet Commonly known as: ZOFRAN Take 4 mg by mouth every 8 (eight) hours as needed for nausea/vomiting.   oxyCODONE 15 mg 12 hr tablet Commonly known as: OXYCONTIN Take 1 tablet (15 mg total) by mouth every 12 (twelve) hours for 4 days. Hold for sedation, if bp <100 or if hr < 60/min   PARoxetine 10 MG tablet Commonly known as: PAXIL Take 10 mg by mouth at bedtime.   Percocet 5-325 MG tablet Generic drug: oxyCODONE-acetaminophen Take 1 tablet by mouth every 6 (six) hours as needed for up to 6 doses for severe pain. What changed: reasons to take this   polyethylene glycol 17 g packet Commonly known as: MIRALAX / GLYCOLAX Take 17 g by mouth daily as needed for moderate constipation.   psyllium 58.6 % packet Commonly known as: METAMUCIL Take 1 packet by mouth daily at 12 noon.   senna 8.6 MG Tabs tablet Commonly known as: SENOKOT Take 1 tablet (8.6 mg total) by mouth 2 (two) times daily.   Systane 0.4-0.3 % Soln Generic drug: Polyethyl Glycol-Propyl Glycol Place 1 drop into both eyes 2 (two) times daily as needed (for tired/dry eyes.).      Follow-up Information    Myrlene Broker, MD Follow up in 1 week(s).   Specialty: Family Medicine Contact information: [place] [address] [phone]        Newman Pies, MD Follow up in 2 week(s).   Specialty:  Neurosurgery Contact information: [address] [place]  [address] [phone]          Allergies  Allergen Reactions  . Sulfamethoxazole Diarrhea and Other (See Comments)    GI UPSET    The results of significant diagnostics from this hospitalization (including imaging, microbiology, ancillary and laboratory) are listed below for reference.    Microbiology: Recent Results (  from the past 240 hour(s))  Urine culture     Status: None   Collection Time: 03/08/20  1:55 PM   Specimen: Urine, Random  Result Value Ref Range Status   Specimen Description URINE, RANDOM  Final   Special Requests NONE  Final   Culture   Final    NO GROWTH Performed at [facility], [address], [place], [place] [address]    Report Status 03/09/2020 FINAL  Final  SARS Coronavirus 2 by RT PCR (hospital order, performed in [facility] hospital lab) Nasopharyngeal Nasopharyngeal Swab     Status: None   Collection Time: 03/08/20  7:02 PM   Specimen: Nasopharyngeal Swab  Result Value Ref Range Status   SARS Coronavirus 2 NEGATIVE NEGATIVE Final    Comment: (NOTE) SARS-CoV-2 target nucleic acids are NOT DETECTED. The SARS-CoV-2 RNA is generally detectable in upper and lower respiratory specimens during the acute phase of infection. The lowest concentration of SARS-CoV-2 viral copies this assay can detect is 250 copies / mL. A negative result does not preclude SARS-CoV-2 infection and should not be used as the sole basis for treatment or other patient management decisions.  A negative result may occur with improper specimen collection / handling, submission of specimen other than nasopharyngeal swab, presence of viral mutation(s) within the areas targeted by this assay, and inadequate number of viral copies (<250 copies / mL). A negative result must be combined with clinical observations, patient history, and epidemiological information. Fact Sheet for Patients:    StrictlyIdeas.no Fact Sheet for Healthcare Providers: BankingDealers.co.za This test is not yet approved or cleared  by the [place] FDA and has been authorized for detection and/or diagnosis of SARS-CoV-2 by FDA under an Emergency Use Authorization (EUA).  This EUA will remain in effect (meaning this test can be used) for the duration of the COVID-19 declaration under Section 564(b)(1) of the Act, 21 U.S.C. section 360bbb-3(b)(1), unless the authorization is terminated or revoked sooner. Performed at [facility], [place] [address], [place], [place] [address]   SARS Coronavirus 2 by RT PCR (hospital order, performed in [facility] hospital lab) Nasopharyngeal Nasopharyngeal Swab     Status: None   Collection Time: 03/12/20  8:43 AM   Specimen: Nasopharyngeal Swab  Result Value Ref Range Status   SARS Coronavirus 2 NEGATIVE NEGATIVE Final    Comment: (NOTE) SARS-CoV-2 target nucleic acids are NOT DETECTED. The SARS-CoV-2 RNA is generally detectable in upper and lower respiratory specimens during the acute phase of infection. The lowest concentration of SARS-CoV-2 viral copies this assay can detect is 250 copies / mL. A negative result does not preclude SARS-CoV-2 infection and should not be used as the sole basis for treatment or other patient management decisions.  A negative result may occur with improper specimen collection / handling, submission of specimen other than nasopharyngeal swab, presence of viral mutation(s) within the areas targeted by this assay, and inadequate number of viral copies (<250 copies / mL). A negative result must be combined with clinical observations, patient history, and epidemiological information. Fact Sheet for Patients:   StrictlyIdeas.no Fact Sheet for Healthcare Providers: BankingDealers.co.za This test is not yet approved or cleared  by the  [place] FDA and has been authorized for detection and/or diagnosis of SARS-CoV-2 by FDA under an Emergency Use Authorization (EUA).  This EUA will remain in effect (meaning this test can be used) for the duration of the COVID-19 declaration under Section 564(b)(1) of the  Act, 21 U.S.C. section 360bbb-3(b)(1), unless the authorization is terminated or revoked sooner. Performed at [facility], [place] [address] [place], [place] [address]     Procedures/Studies: DG Ribs Unilateral W/Chest Right  Result Date: 03/08/2020 CLINICAL DATA:  [address] 1 week ago with persistent right-sided pain. EXAM: RIGHT RIBS AND CHEST - 3+ VIEW COMPARISON:  02/29/2020 FINDINGS: Rib films do not show any visible rib fracture. There does appear to be mild bibasilar atelectasis. No pneumothorax. IMPRESSION: No rib injury seen. The patient does appear to have mild bibasilar atelectasis. This raises concern about the possibility of an occult/nondisplaced fracture. Electronically Signed   By: Nelson Chimes M.D.   On: 03/08/2020 15:14   CT Chest W Contrast  Result Date: 03/08/2020 CLINICAL DATA:  Fall last Monday. Bruising on lower back and right flank. Abdominal pain. EXAM: CT CHEST, ABDOMEN, AND PELVIS WITH CONTRAST TECHNIQUE: Multidetector CT imaging of the chest, abdomen and pelvis was performed following the standard protocol during bolus administration of intravenous contrast. CONTRAST:  146mL OMNIPAQUE IOHEXOL 300 MG/ML  SOLN COMPARISON:  Chest and rib radiographs 03/08/2020. FINDINGS: CT CHEST FINDINGS Cardiovascular: Aortic atherosclerosis. Tortuous thoracic aorta. Mild cardiomegaly, without pericardial effusion. Mediastinum/Nodes: No mediastinal or hilar adenopathy. Lungs/Pleura: Small, right greater than left pleural effusions. No pneumothorax. Right apical pulmonary nodule measures 4 mm on 29/4. Bibasilar atelectasis. Calcified right upper lobe granuloma. Musculoskeletal: T12 vertebral augmentation with an  underlying moderate compression deformity. Coronal fracture lines are identified at the junction of the body and pedicles including on 47/3. Complex fracture is identified within the T11 vertebral body as well, including on coronal image 109. This is primarily within the transverse plane. Lower cervical spondylosis. CT ABDOMEN PELVIS FINDINGS Hepatobiliary: Normal liver. Cholecystectomy. The common duct measures up to 1.2 cm on 55/3. No obstructive stone or mass. Pancreas: Normal pancreas, without duct dilatation or acute inflammation. Spleen: Normal in size, without focal abnormality. Adrenals/Urinary Tract: Normal adrenal glands. Bilateral renal cortical thinning, within normal variation for age. No hydronephrosis. Normal urinary bladder. Stomach/Bowel: Proximal gastric underdistention. Scattered colonic diverticula. Colonic stool burden suggests constipation. Normal terminal ileum. Normal small bowel. Vascular/Lymphatic: Aortic atherosclerosis. Mild ectasia of the celiac. No abdominopelvic adenopathy. Reproductive: Hysterectomy.  No adnexal mass. Other: No significant free fluid.  No free intraperitoneal air. Musculoskeletal: Remote bilateral pubic rami fractures. Sclerotic lesion within the right ischium measures 1.2 cm on 107/3. Osteopenia. L1 vertebral hemangioma. Lumbar spine to be evaluated on dedicated report, dictated separately. IMPRESSION: 1. Status post T12 vertebral augmentation with complex, likely acute fractures involving T11 and T12. Consider thoracic spine MRI. 2. No other acute posttraumatic deformity identified. 3. Small bilateral pleural effusions with compressive atelectasis in both lung bases. 4. Cholecystectomy with mild biliary duct dilatation. Correlate with bilirubin levels. If normal, this is likely within normal variation for age. 5. Sclerotic lesion within the right ischium is most likely a bone island. Correlate with any history of primary malignancy. 6. Right apical pulmonary nodule  of 4 mm. No follow-up needed if patient is low-risk. Non-contrast chest CT can be considered in 12 months if patient is high-risk. This recommendation follows the consensus statement: Guidelines for Management of Incidental Pulmonary Nodules Detected on CT Images: From the Fleischner Society 2017; Radiology 2017; 284:228-243. Electronically Signed   By: Abigail Miyamoto M.D.   On: 03/08/2020 16:42   MR THORACIC SPINE WO CONTRAST  Result Date: 03/10/2020 CLINICAL DATA:  Mechanical fall. Fractures of T11 and T12. History of T12 vertebroplasty EXAM: MRI  THORACIC SPINE WITHOUT CONTRAST TECHNIQUE: Multiplanar, multisequence MR imaging of the thoracic spine was performed. No intravenous contrast was administered. COMPARISON:  CT 03/08/2020 FINDINGS: Alignment: No static listhesis. Slightly exaggerated thoracic kyphosis. Vertebrae: Complex fracture of the T11 vertebral body with a fluid-filled fracture cleft which is predominantly oriented in the transverse plane extending from the inferior endplate on the left to the junction of the vertebral body and right pedicle. The fracture margins appear partially corticated (series 6, images 4-8). There is mild residual marrow edema within the T11 vertebral body, suggesting an acute on chronic fracture with likely abnormal motion across the fracture site. Low T1/T2 signal changes within the inferior aspect of the T12 segment compatible with prior cement augmentation. Similar degree of superior endplate depression at L38 subtle marrow edema is evident at the T12 superior endplate. Large intraosseous hemangioma within the L1 level. Remaining vertebral body heights are maintained. No additional fractures. Discogenic endplate marrow changes are noted throughout the spine including the visualized lower cervical and upper lumbar spines. No evidence to suggest discitis. Cord:  No cord signal abnormality identified. Paraspinal and other soft tissues: Small right greater than left bilateral  pleural effusions. Disc levels: No foraminal or canal stenosis at the T1-2 through T9-10 levels. T10-11: Mild canal stenosis secondary to posterior disc osteophyte complex and posterior element hypertrophy. Mild-to-moderate left foraminal stenosis. T11-T12: Moderate to severe canal stenosis without least moderate bilateral foraminal stenosis secondary to slight bony retropulsion of the inferior aspect of T11 and posterior element hypertrophy. T12-L1: Bilateral facet arthropathy without foraminal or canal stenosis. IMPRESSION: 1. Complex T11 vertebral body fracture with a fluid-filled fracture cleft. Mild residual marrow edema within the T11 vertebral body, suggesting an acute on chronic fracture with likely abnormal motion across the fracture site. 2. Moderate to severe canal stenosis at T11-12 without cord signal abnormality. Bony retropulsion of the inferior aspect of T11 and posterior element hypertrophy contribute to moderate bilateral foraminal stenosis at this level. 3. Mild superior endplate depression at B01 with subtle marrow edema at the superior endplate, which may represent a subtle acute on chronic fracture. There has been prior vertebroplasty at the T12 level. 4. Mild canal stenosis at T10-11. 5. Small right greater than left bilateral pleural effusions. Electronically Signed   By: Davina Poke D.O.   On: 03/10/2020 17:52   CT ABDOMEN PELVIS W CONTRAST  Result Date: 03/08/2020 CLINICAL DATA:  Fall last Monday. Bruising on lower back and right flank. Abdominal pain. EXAM: CT CHEST, ABDOMEN, AND PELVIS WITH CONTRAST TECHNIQUE: Multidetector CT imaging of the chest, abdomen and pelvis was performed following the standard protocol during bolus administration of intravenous contrast. CONTRAST:  179mL OMNIPAQUE IOHEXOL 300 MG/ML  SOLN COMPARISON:  Chest and rib radiographs 03/08/2020. FINDINGS: CT CHEST FINDINGS Cardiovascular: Aortic atherosclerosis. Tortuous thoracic aorta. Mild cardiomegaly,  without pericardial effusion. Mediastinum/Nodes: No mediastinal or hilar adenopathy. Lungs/Pleura: Small, right greater than left pleural effusions. No pneumothorax. Right apical pulmonary nodule measures 4 mm on 29/4. Bibasilar atelectasis. Calcified right upper lobe granuloma. Musculoskeletal: T12 vertebral augmentation with an underlying moderate compression deformity. Coronal fracture lines are identified at the junction of the body and pedicles including on 47/3. Complex fracture is identified within the T11 vertebral body as well, including on coronal image 109. This is primarily within the transverse plane. Lower cervical spondylosis. CT ABDOMEN PELVIS FINDINGS Hepatobiliary: Normal liver. Cholecystectomy. The common duct measures up to 1.2 cm on 55/3. No obstructive stone or mass. Pancreas: Normal pancreas, without duct dilatation  or acute inflammation. Spleen: Normal in size, without focal abnormality. Adrenals/Urinary Tract: Normal adrenal glands. Bilateral renal cortical thinning, within normal variation for age. No hydronephrosis. Normal urinary bladder. Stomach/Bowel: Proximal gastric underdistention. Scattered colonic diverticula. Colonic stool burden suggests constipation. Normal terminal ileum. Normal small bowel. Vascular/Lymphatic: Aortic atherosclerosis. Mild ectasia of the celiac. No abdominopelvic adenopathy. Reproductive: Hysterectomy.  No adnexal mass. Other: No significant free fluid.  No free intraperitoneal air. Musculoskeletal: Remote bilateral pubic rami fractures. Sclerotic lesion within the right ischium measures 1.2 cm on 107/3. Osteopenia. L1 vertebral hemangioma. Lumbar spine to be evaluated on dedicated report, dictated separately. IMPRESSION: 1. Status post T12 vertebral augmentation with complex, likely acute fractures involving T11 and T12. Consider thoracic spine MRI. 2. No other acute posttraumatic deformity identified. 3. Small bilateral pleural effusions with compressive  atelectasis in both lung bases. 4. Cholecystectomy with mild biliary duct dilatation. Correlate with bilirubin levels. If normal, this is likely within normal variation for age. 5. Sclerotic lesion within the right ischium is most likely a bone island. Correlate with any history of primary malignancy. 6. Right apical pulmonary nodule of 4 mm. No follow-up needed if patient is low-risk. Non-contrast chest CT can be considered in 12 months if patient is high-risk. This recommendation follows the consensus statement: Guidelines for Management of Incidental Pulmonary Nodules Detected on CT Images: From the Fleischner Society 2017; Radiology 2017; 284:228-243. Electronically Signed   By: Abigail Miyamoto M.D.   On: 03/08/2020 16:42   CT L-SPINE NO CHARGE  Result Date: 03/08/2020 CLINICAL DATA:  [address] a week ago with persistent back pain. EXAM: CT LUMBAR SPINE WITHOUT CONTRAST TECHNIQUE: Multidetector CT imaging of the lumbar spine was performed without intravenous contrast administration. Multiplanar CT image reconstructions were also generated. COMPARISON:  02/29/2020 FINDINGS: Segmentation: 5 lumbar type vertebral bodies. Alignment: Normal except for mild curvature. 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 No lumbar region fracture. Old healed sacral fracture at the S2 level. Previously augmented fracture at T12. We do not see above the inferior aspect on this lumbar examination. Referring to the whole body CT, there appears to be pedicle fractures present. I think these were visible on the previous study but were not nearly as conspicuous. There is also some loss of height at the inferior T11 vertebral body that could be active. Paraspinal and other soft tissues: See results of abdominal CT. Disc levels: Moderate stenosis at the L2-3 level due to endplate osteophytes, bulging of the disc and facet and ligamentous hypertrophy. Mild lateral recess stenosis the L4-5 level because of similar findings.  IMPRESSION: No acute fracture in the lumbar region. Previously augmented fracture at inferior T12. No evidence of progressive loss of height at that level. Patient does have bilateral pedicle fractures at this level which are more conspicuous than on the study of 02/29/2020. This could have been exacerbated by the recent fall. Inferior T11 fracture which is probably recent. This was present at least to a degree on 02/29/2020 but appears to have worsened. These thoracic findings are taken from the larger CT of the chest and abdomen done today rather than the reconstructed images ordered in the lumbar spine. Electronically Signed   By: Nelson Chimes M.D.   On: 03/08/2020 16:22    Labs: BNP (last 3 results) No results for input(s): BNP in the last 8760 hours. Basic Metabolic Panel: Recent Labs  Lab 03/10/20 0438 03/11/20 0736 03/12/20 0242 03/13/20 0311 03/14/20 0500  NA 133* 133* 131* 132* 132*  K  4.2 3.9 3.9 4.1 4.7  CL 98 97* 96* 96* 97*  CO2 29 25 26 26 25   GLUCOSE 129* 105* 114* 118* 120*  BUN 23 20 24* 17 18  CREATININE 0.82 0.68 0.73 0.69 0.75  CALCIUM 8.3* 8.3* 8.1* 8.3* 8.4*   Liver Function Tests: Recent Labs  Lab 03/08/20 1336  AST 31  ALT 27  ALKPHOS 79  BILITOT 1.1  PROT 6.5  ALBUMIN 3.3*   Recent Labs  Lab 03/08/20 1336  LIPASE 40   No results for input(s): AMMONIA in the last 168 hours. CBC: Recent Labs  Lab 03/08/20 1336 03/09/20 0337  WBC 8.6 9.2  NEUTROABS 6.6  --   HGB 11.9* 11.5*  HCT 35.8* 34.7*  MCV 98.6 97.5  PLT 316 327   Cardiac Enzymes: No results for input(s): CKTOTAL, CKMB, CKMBINDEX, TROPONINI in the last 168 hours. BNP: Invalid input(s): POCBNP CBG: No results for input(s): GLUCAP in the last 168 hours. D-Dimer No results for input(s): DDIMER in the last 72 hours. Hgb A1c No results for input(s): HGBA1C in the last 72 hours. Lipid Profile No results for input(s): CHOL, HDL, LDLCALC, TRIG, CHOLHDL, LDLDIRECT in the last 72  hours. Thyroid function studies No results for input(s): TSH, T4TOTAL, T3FREE, THYROIDAB in the last 72 hours.  Invalid input(s): FREET3 Anemia work up No results for input(s): VITAMINB12, FOLATE, FERRITIN, TIBC, IRON, RETICCTPCT in the last 72 hours. Urinalysis    Component Value Date/Time   COLORURINE YELLOW 03/08/2020 1355   APPEARANCEUR CLEAR 03/08/2020 1355   LABSPEC 1.010 03/08/2020 1355   PHURINE 7.0 03/08/2020 1355   GLUCOSEU NEGATIVE 03/08/2020 1355   HGBUR NEGATIVE 03/08/2020 1355   BILIRUBINUR NEGATIVE 03/08/2020 1355   KETONESUR NEGATIVE 03/08/2020 1355   PROTEINUR 30 (A) 03/08/2020 1355   NITRITE NEGATIVE 03/08/2020 1355   LEUKOCYTESUR NEGATIVE 03/08/2020 1355   Sepsis Labs Invalid input(s): PROCALCITONIN,  WBC,  LACTICIDVEN Microbiology Recent Results (from the past 240 hour(s))  Urine culture     Status: None   Collection Time: 03/08/20  1:55 PM   Specimen: Urine, Random  Result Value Ref Range Status   Specimen Description URINE, RANDOM  Final   Special Requests NONE  Final   Culture   Final    NO GROWTH Performed at [facility], [place] [address] [place], [place] [address]    Report Status 03/09/2020 FINAL  Final  SARS Coronavirus 2 by RT PCR (hospital order, performed in [facility] hospital lab) Nasopharyngeal Nasopharyngeal Swab     Status: None   Collection Time: 03/08/20  7:02 PM   Specimen: Nasopharyngeal Swab  Result Value Ref Range Status   SARS Coronavirus 2 NEGATIVE NEGATIVE Final    Comment: (NOTE) SARS-CoV-2 target nucleic acids are NOT DETECTED. The SARS-CoV-2 RNA is generally detectable in upper and lower respiratory specimens during the acute phase of infection. The lowest concentration of SARS-CoV-2 viral copies this assay can detect is 250 copies / mL. A negative result does not preclude SARS-CoV-2 infection and should not be used as the sole basis for treatment or other patient management decisions.  A negative result may occur  with improper specimen collection / handling, submission of specimen other than nasopharyngeal swab, presence of viral mutation(s) within the areas targeted by this assay, and inadequate number of viral copies (<250 copies / mL). A negative result must be combined with clinical observations, patient history, and epidemiological information. Fact Sheet for Patients:   StrictlyIdeas.no Fact Sheet for  Healthcare Providers: BankingDealers.co.za This test is not yet approved or cleared  by the [place] and has been authorized for detection and/or diagnosis of SARS-CoV-2 by FDA under an Emergency Use Authorization (EUA).  This EUA will remain in effect (meaning this test can be used) for the duration of the COVID-19 declaration under Section 564(b)(1) of the Act, 21 U.S.C. section 360bbb-3(b)(1), unless the authorization is terminated or revoked sooner. Performed at [facility], [place] [address] [place], [place] [address]   SARS Coronavirus 2 by RT PCR (hospital order, performed in [facility] hospital lab) Nasopharyngeal Nasopharyngeal Swab     Status: None   Collection Time: 03/12/20  8:43 AM   Specimen: Nasopharyngeal Swab  Result Value Ref Range Status   SARS Coronavirus 2 NEGATIVE NEGATIVE Final    Comment: (NOTE) SARS-CoV-2 target nucleic acids are NOT DETECTED. The SARS-CoV-2 RNA is generally detectable in upper and lower respiratory specimens during the acute phase of infection. The lowest concentration of SARS-CoV-2 viral copies this assay can detect is 250 copies / mL. A negative result does not preclude SARS-CoV-2 infection and should not be used as the sole basis for treatment or other patient management decisions.  A negative result may occur with improper specimen collection / handling, submission of specimen other than nasopharyngeal swab, presence of viral mutation(s) within the areas targeted by this assay, and  inadequate number of viral copies (<250 copies / mL). A negative result must be combined with clinical observations, patient history, and epidemiological information. Fact Sheet for Patients:   StrictlyIdeas.no Fact Sheet for Healthcare Providers: BankingDealers.co.za This test is not yet approved or cleared  by the [place] FDA and has been authorized for detection and/or diagnosis of SARS-CoV-2 by FDA under an Emergency Use Authorization (EUA).  This EUA will remain in effect (meaning this test can be used) for the duration of the COVID-19 declaration under Section 564(b)(1) of the Act, 21 U.S.C. section 360bbb-3(b)(1), unless the authorization is terminated or revoked sooner. Performed at [facility], [place] [address], [place], [place] [address]      Time coordinating discharge: 25  minutes  SIGNED: Antonieta Pert, MD  [facility] 03/14/2020, 1:17 PM  If 7PM-7AM, please contact night-coverage [email or web address]

## 2020-03-14 NOTE — Plan of Care (Signed)

## 2020-03-14 NOTE — Progress Notes (Signed)
Report given to [place], LPN at [facility] facility. Discharge package printed and will give to [facility].

## 2020-03-14 NOTE — Care Management (Cosign Needed)
    Durable Medical Equipment  (From admission, onward)         Start     Ordered   03/14/20 1657  For home use only DME 3 n 1  Once     03/14/20 1656   03/14/20 1657  For home use only DME Hospital bed  Once    Question Answer Comment  Length of Need 12 Months   Patient has (list medical condition): T12 closed fracture   The above medical condition requires: Patient requires the ability to reposition frequently   Head must be elevated greater than: 30 degrees   Bed type Semi-electric   Support Surface: Gel Overlay      03/14/20 1656

## 2020-03-14 NOTE — Progress Notes (Signed)
The patient will be discharged to home tomorrow per family member.

## 2020-03-14 NOTE — TOC Transition Note (Signed)
Transition of Care [facility]) - CM/SW Discharge Note   Patient Details  Name: [name] MRN: [id number] Date of Birth: 28-Apr-1923  [facility] CM/SW Contact:  Curlene Labrum, RN Phone Number: 03/14/2020, 4:49 PM   Clinical Narrative:    Case management met with the family after the family decided to not transfer the patient to the skilled facility - [facility] due to visitation policy of 2 visits per week.  I spoke with the daughter, nancy regarding Medicare choice for [facility] options.  The daughter did not have a preference and [facility] was called for PT/OT/aide, MSW. Tommi Rumps, with [facility] will check regarding Home first qualification for the patient.  Patient will discharge to home tomorrow.  The patient will need a hospital bed and 3:1 and [employer] was called to facility this equipment delivery for the family.  Will follow for discharge needs tomorrow.   Final next level of care: Skilled Nursing Facility Barriers to Discharge: No Barriers Identified   Patient Goals and CMS Choice Patient states their goals for this hospitalization and ongoing recovery are:: Patient wants to get better. CMS Medicare.gov Compare Post Acute Care list provided to:: Patient Choice offered to / list presented to : Patient, [facility] POA / Guardian  Discharge Placement                Patient to be transferred to facility by: [place] Name of family member notified: nancy [place], daughter Patient and family notified of of transfer: 03/14/20  Discharge Plan and Services   Discharge Planning Services: CM Consult Post Acute Care Choice: Durable Medical Equipment, [place]                               Social Determinants of Health (SDOH) Interventions     Readmission Risk Interventions No flowsheet data found.

## 2020-03-15 LAB — BASIC METABOLIC PANEL
Anion gap: 12 (ref 5–15)
BUN: 14 mg/dL (ref 8–23)
CO2: 25 mmol/L (ref 22–32)
Calcium: 8.7 mg/dL — ABNORMAL LOW (ref 8.9–10.3)
Chloride: 96 mmol/L — ABNORMAL LOW (ref 98–111)
Creatinine, Ser: 0.69 mg/dL (ref 0.44–1.00)
GFR calc Af Amer: >60 mL/min (ref >60)
GFR calc non Af Amer: >60 mL/min (ref >60)
Glucose, Bld: 114 mg/dL — ABNORMAL HIGH (ref 70–99)
Potassium: 4.4 mmol/L (ref 3.5–5.1)
Sodium: 133 mmol/L — ABNORMAL LOW (ref 135–145)

## 2020-03-15 MED ORDER — ACETAMINOPHEN 500 MG PO TABS: 1000.0000 mg | ORAL_TABLET | Freq: Three times a day (TID) | ORAL | 0 refills | Status: DC

## 2020-03-15 MED ORDER — OXYCODONE HCL 5 MG PO TABS
5.0000 mg | ORAL_TABLET | Freq: Four times a day (QID) | ORAL | Status: DC | PRN
  Administered 2020-03-15: 10 mg via ORAL
  Filled 2020-03-15: qty 2

## 2020-03-15 MED ORDER — OXYCODONE HCL 5 MG PO TABS: 5.0000 mg | ORAL_TABLET | Freq: Four times a day (QID) | ORAL | 0 refills | Status: DC | PRN

## 2020-03-15 MED ORDER — METHOCARBAMOL 500 MG PO TABS: 500.0000 mg | ORAL_TABLET | Freq: Four times a day (QID) | ORAL | 0 refills | Status: DC | PRN

## 2020-03-15 MED ORDER — ACETAMINOPHEN 500 MG PO TABS: 1000.0000 mg | ORAL_TABLET | Freq: Three times a day (TID) | ORAL | 2 refills | Status: AC

## 2020-03-15 MED ORDER — ACETAMINOPHEN 500 MG PO TABS
1000.0000 mg | ORAL_TABLET | Freq: Three times a day (TID) | ORAL | Status: DC
  Administered 2020-03-15 (×2): 1000 mg via ORAL
  Filled 2020-03-15 (×2): qty 2

## 2020-03-15 MED ORDER — BISACODYL 10 MG RE SUPP
10.0000 mg | Freq: Every day | RECTAL | Status: DC | PRN
  Administered 2020-03-15: 10 mg via RECTAL
  Filled 2020-03-15: qty 1

## 2020-03-15 NOTE — TOC Transition Note (Signed)
Transition of Care [facility]) - CM/SW Discharge Note   Patient Details  Name: ALAWNA GRAYBEAL MRN: [id number] Date of Birth: [birth year]  [facility]) CM/SW Contact:  Curlene Labrum, RN Phone Number: 03/15/2020, 2:59 PM   Clinical Narrative:    Case management met with the patient and family and the hospital bed was delivered to the home.  [facility] called for transport and the patient and daughter, Izora Gala, are aware of transport plans for home by [facility].   Final next level of care: Skilled Nursing Facility Barriers to Discharge: No Barriers Identified   Patient Goals and CMS Choice Patient states their goals for this hospitalization and ongoing recovery are:: Patient wants to get better. CMS Medicare.gov Compare Post Acute Care list provided to:: Patient Choice offered to / list presented to : Patient, [facility] POA / Guardian  Discharge Placement                Patient to be transferred to facility by: [place] Name of family member notified: nancy [place], daughter Patient and family notified of of transfer: 03/14/20  Discharge Plan and Services   Discharge Planning Services: CM Consult Post Acute Care Choice: Durable Medical Equipment, [place]                               Social Determinants of Health (SDOH) Interventions     Readmission Risk Interventions No flowsheet data found.

## 2020-03-15 NOTE — Discharge Summary (Signed)
Physician Discharge Summary  [name]:[id number] DOB: 25-Apr-1923 DOA: 03/08/2020  PCP: Myrlene Broker, MD  Admit date: 03/08/2020 Discharge date: 03/15/2020  Admitted From: home Discharge disposition: home   Recommendations for Outpatient Follow-Up:   1. Needs bowel regimen 2. Community palliative care referral   Discharge Diagnosis:   Principal Problem:   Closed T12 fracture ([place]) Active Problems:   Type II diabetes mellitus (HCC)   Depression   Hyperlipidemia associated with type 2 diabetes mellitus ([place])   Hypothyroidism   Hypertension associated with diabetes ([place])   Back pain    Discharge Condition: Improved.  Diet recommendation: Low sodium, heart healthy.  Carbohydrate-modified  Wound care: None.  Code status:dnr   History of Present Illness:   [name] is a 86 y.o. female with medical history significant for type 2 diabetes, hypertension, hyperlipidemia, hypothyroidism, GERD, anxiety/depression, and back pain who presents to the ED for evaluation of abdominal and back pain.  Patient states she fell about 1 week ago.  She says she was outside her home try to get water from the faucet.  She was bent over and when she stood up she became lightheaded and fell down onto the cement/brick walkway.  She began to have lower back pain and was unable to get up on her own power.  She tried calling out to her husband but nobody was near enough to hear her.  She says she was down for about 2 hours until she was able to crawl to an area where a neighbor was passing by to help her.  She was taken to [facility] ED for further evaluation.  She was given fluids for dehydration and hydrocodone to help with pain control.  Since then patient has been having continued lower back and right flank pain which radiates into her abdomen.  She has been having difficulty ambulating even with the use of a walker and has significant trouble transferring into and out  of bed.  She has had poor oral intake in the last few days and difficulty with her ADLs.  She denies any recent chest pain, dyspnea, fevers, chills, diaphoresis, or diarrhea.  She says she has had some recent constipation and used a suppository to find some relief.  She currently lives at home with her husband.  Daughter states that family lives close by and neighbors also check in on her.   Hospital Course by Problem:   Acute compression fracture of T11 and mild depression of the endplate of W65 with uncontrolled pain:She had CT abdomen T12 vertebral augmentation with complex likely acute fractures involving T11, T12.Then underwent MRI T-spine due to ongoing pain- showsacute compression fracture at T11 and a mild depression at the endplate of [id number] was reviewed by neurosurgery Dr. Arsenio Loader PA a and will need outpatient follow-up in 6 weeks and continue pain control and TSLO. -Cont lidocaine patch and Robaxin. -patient very sedate on oxycodone 15 mg-- will change to scheduled tylenol and PRN oxy IR  Constipation: Patient will need to continue on bowel regimen and as needed bisacodyl suppository.  Due to opioid pain med  Hyponatremia: Encourage oral hydration.   Uncontrolled hypertension likely in the setting of pain.   -titrate medications as able  T2DM:Diet controlled.  Depression: Mood is stable on paroxetine.  Hyperlipidemiaassociated with type 2 diabetes mellitus:Stable on Lipitor.  Hypothyroidism: Continue home Synthroid.  TSH in 4 weeks     Medical Consultants:      Discharge  Exam:   Vitals:   03/15/20 0307 03/15/20 0854  BP: (!) 171/82 (!) 186/78  Pulse: 99 97  Resp: 18 17  Temp: 97.9 F (36.6 C) (!) 97.4 F (36.3 C)  SpO2: 91% 95%   Vitals:   03/14/20 1651 03/14/20 1950 03/15/20 0307 03/15/20 0854  BP: (!) 136/56 127/62 (!) 171/82 (!) 186/78  Pulse: 75 84 99 97  Resp: 16 17 18 17   Temp: 98.3 F (36.8 C) 98.1 F (36.7 C) 97.9 F  (36.6 C) (!) 97.4 F (36.3 C)  TempSrc: Oral Oral Oral Oral  SpO2: 91% 93% 91% 95%  Weight:      Height:        General exam: very sleepy, difficult to wake up   The results of significant diagnostics from this hospitalization (including imaging, microbiology, ancillary and laboratory) are listed below for reference.     Procedures and Diagnostic Studies:   DG Ribs Unilateral W/Chest Right  Result Date: 03/08/2020 CLINICAL DATA:  [place] 1 week ago with persistent right-sided pain. EXAM: RIGHT RIBS AND CHEST - 3+ VIEW COMPARISON:  02/29/2020 FINDINGS: Rib films do not show any visible rib fracture. There does appear to be mild bibasilar atelectasis. No pneumothorax. IMPRESSION: No rib injury seen. The patient does appear to have mild bibasilar atelectasis. This raises concern about the possibility of an occult/nondisplaced fracture. Electronically Signed   By: Nelson Chimes M.D.   On: 03/08/2020 15:14   CT Chest W Contrast  Result Date: 03/08/2020 CLINICAL DATA:  Fall last Monday. Bruising on lower back and right flank. Abdominal pain. EXAM: CT CHEST, ABDOMEN, AND PELVIS WITH CONTRAST TECHNIQUE: Multidetector CT imaging of the chest, abdomen and pelvis was performed following the standard protocol during bolus administration of intravenous contrast. CONTRAST:  118mL OMNIPAQUE IOHEXOL 300 MG/ML  SOLN COMPARISON:  Chest and rib radiographs 03/08/2020. FINDINGS: CT CHEST FINDINGS Cardiovascular: Aortic atherosclerosis. Tortuous thoracic aorta. Mild cardiomegaly, without pericardial effusion. Mediastinum/Nodes: No mediastinal or hilar adenopathy. Lungs/Pleura: Small, right greater than left pleural effusions. No pneumothorax. Right apical pulmonary nodule measures 4 mm on 29/4. Bibasilar atelectasis. Calcified right upper lobe granuloma. Musculoskeletal: T12 vertebral augmentation with an underlying moderate compression deformity. Coronal fracture lines are identified at the junction of the body and  pedicles including on 47/3. Complex fracture is identified within the T11 vertebral body as well, including on coronal image 109. This is primarily within the transverse plane. Lower cervical spondylosis. CT ABDOMEN PELVIS FINDINGS Hepatobiliary: Normal liver. Cholecystectomy. The common duct measures up to 1.2 cm on 55/3. No obstructive stone or mass. Pancreas: Normal pancreas, without duct dilatation or acute inflammation. Spleen: Normal in size, without focal abnormality. Adrenals/Urinary Tract: Normal adrenal glands. Bilateral renal cortical thinning, within normal variation for age. No hydronephrosis. Normal urinary bladder. Stomach/Bowel: Proximal gastric underdistention. Scattered colonic diverticula. Colonic stool burden suggests constipation. Normal terminal ileum. Normal small bowel. Vascular/Lymphatic: Aortic atherosclerosis. Mild ectasia of the celiac. No abdominopelvic adenopathy. Reproductive: Hysterectomy.  No adnexal mass. Other: No significant free fluid.  No free intraperitoneal air. Musculoskeletal: Remote bilateral pubic rami fractures. Sclerotic lesion within the right ischium measures 1.2 cm on 107/3. Osteopenia. L1 vertebral hemangioma. Lumbar spine to be evaluated on dedicated report, dictated separately. IMPRESSION: 1. Status post T12 vertebral augmentation with complex, likely acute fractures involving T11 and T12. Consider thoracic spine MRI. 2. No other acute posttraumatic deformity identified. 3. Small bilateral pleural effusions with compressive atelectasis in both lung bases. 4. Cholecystectomy with mild biliary duct dilatation. Correlate  with bilirubin levels. If normal, this is likely within normal variation for age. 5. Sclerotic lesion within the right ischium is most likely a bone island. Correlate with any history of primary malignancy. 6. Right apical pulmonary nodule of 4 mm. No follow-up needed if patient is low-risk. Non-contrast chest CT can be considered in 12 months if  patient is high-risk. This recommendation follows the consensus statement: Guidelines for Management of Incidental Pulmonary Nodules Detected on CT Images: From the Fleischner Society 2017; Radiology 2017; 284:228-243. Electronically Signed   By: Abigail Miyamoto M.D.   On: 03/08/2020 16:42   CT ABDOMEN PELVIS W CONTRAST  Result Date: 03/08/2020 CLINICAL DATA:  Fall last Monday. Bruising on lower back and right flank. Abdominal pain. EXAM: CT CHEST, ABDOMEN, AND PELVIS WITH CONTRAST TECHNIQUE: Multidetector CT imaging of the chest, abdomen and pelvis was performed following the standard protocol during bolus administration of intravenous contrast. CONTRAST:  147mL OMNIPAQUE IOHEXOL 300 MG/ML  SOLN COMPARISON:  Chest and rib radiographs 03/08/2020. FINDINGS: CT CHEST FINDINGS Cardiovascular: Aortic atherosclerosis. Tortuous thoracic aorta. Mild cardiomegaly, without pericardial effusion. Mediastinum/Nodes: No mediastinal or hilar adenopathy. Lungs/Pleura: Small, right greater than left pleural effusions. No pneumothorax. Right apical pulmonary nodule measures 4 mm on 29/4. Bibasilar atelectasis. Calcified right upper lobe granuloma. Musculoskeletal: T12 vertebral augmentation with an underlying moderate compression deformity. Coronal fracture lines are identified at the junction of the body and pedicles including on 47/3. Complex fracture is identified within the T11 vertebral body as well, including on coronal image 109. This is primarily within the transverse plane. Lower cervical spondylosis. CT ABDOMEN PELVIS FINDINGS Hepatobiliary: Normal liver. Cholecystectomy. The common duct measures up to 1.2 cm on 55/3. No obstructive stone or mass. Pancreas: Normal pancreas, without duct dilatation or acute inflammation. Spleen: Normal in size, without focal abnormality. Adrenals/Urinary Tract: Normal adrenal glands. Bilateral renal cortical thinning, within normal variation for age. No hydronephrosis. Normal urinary  bladder. Stomach/Bowel: Proximal gastric underdistention. Scattered colonic diverticula. Colonic stool burden suggests constipation. Normal terminal ileum. Normal small bowel. Vascular/Lymphatic: Aortic atherosclerosis. Mild ectasia of the celiac. No abdominopelvic adenopathy. Reproductive: Hysterectomy.  No adnexal mass. Other: No significant free fluid.  No free intraperitoneal air. Musculoskeletal: Remote bilateral pubic rami fractures. Sclerotic lesion within the right ischium measures 1.2 cm on 107/3. Osteopenia. L1 vertebral hemangioma. Lumbar spine to be evaluated on dedicated report, dictated separately. IMPRESSION: 1. Status post T12 vertebral augmentation with complex, likely acute fractures involving T11 and T12. Consider thoracic spine MRI. 2. No other acute posttraumatic deformity identified. 3. Small bilateral pleural effusions with compressive atelectasis in both lung bases. 4. Cholecystectomy with mild biliary duct dilatation. Correlate with bilirubin levels. If normal, this is likely within normal variation for age. 5. Sclerotic lesion within the right ischium is most likely a bone island. Correlate with any history of primary malignancy. 6. Right apical pulmonary nodule of 4 mm. No follow-up needed if patient is low-risk. Non-contrast chest CT can be considered in 12 months if patient is high-risk. This recommendation follows the consensus statement: Guidelines for Management of Incidental Pulmonary Nodules Detected on CT Images: From the Fleischner Society 2017; Radiology 2017; 284:228-243. Electronically Signed   By: Abigail Miyamoto M.D.   On: 03/08/2020 16:42   CT L-SPINE NO CHARGE  Result Date: 03/08/2020 CLINICAL DATA:  [place] a week ago with persistent back pain. EXAM: CT LUMBAR SPINE WITHOUT CONTRAST TECHNIQUE: Multidetector CT imaging of the lumbar spine was performed without intravenous contrast administration. Multiplanar CT image reconstructions were  also generated. COMPARISON:  02/29/2020  FINDINGS: Segmentation: 5 lumbar type vertebral bodies. Alignment: Normal except for mild curvature. 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 No lumbar region fracture. Old healed sacral fracture at the S2 level. Previously augmented fracture at T12. We do not see above the inferior aspect on this lumbar examination. Referring to the whole body CT, there appears to be pedicle fractures present. I think these were visible on the previous study but were not nearly as conspicuous. There is also some loss of height at the inferior T11 vertebral body that could be active. Paraspinal and other soft tissues: See results of abdominal CT. Disc levels: Moderate stenosis at the L2-3 level due to endplate osteophytes, bulging of the disc and facet and ligamentous hypertrophy. Mild lateral recess stenosis the L4-5 level because of similar findings. IMPRESSION: No acute fracture in the lumbar region. Previously augmented fracture at inferior T12. No evidence of progressive loss of height at that level. Patient does have bilateral pedicle fractures at this level which are more conspicuous than on the study of 02/29/2020. This could have been exacerbated by the recent fall. Inferior T11 fracture which is probably recent. This was present at least to a degree on 02/29/2020 but appears to have worsened. These thoracic findings are taken from the larger CT of the chest and abdomen done today rather than the reconstructed images ordered in the lumbar spine. Electronically Signed   By: Nelson Chimes M.D.   On: 03/08/2020 16:22     Labs:   Basic Metabolic Panel: Recent Labs  Lab 03/11/20 0736 03/11/20 0736 03/12/20 0242 03/12/20 0242 03/13/20 0311 03/13/20 0311 03/14/20 0500 03/15/20 0319  NA 133*  --  131*  --  132*  --  132* 133*  K 3.9   < > 3.9   < > 4.1   < > 4.7 4.4  CL 97*  --  96*  --  96*  --  97* 96*  CO2 25  --  26  --  26  --  25 25  GLUCOSE 105*  --  114*  --  118*  --  120* 114*  BUN  20  --  24*  --  17  --  18 14  CREATININE 0.68  --  0.73  --  0.69  --  0.75 0.69  CALCIUM 8.3*  --  8.1*  --  8.3*  --  8.4* 8.7*   < > = values in this interval not displayed.   GFR Estimated Creatinine Clearance: 37 mL/min (by C-G formula based on SCr of 0.69 mg/dL). Liver Function Tests: No results for input(s): AST, ALT, ALKPHOS, BILITOT, PROT, ALBUMIN in the last 168 hours. No results for input(s): LIPASE, AMYLASE in the last 168 hours. No results for input(s): AMMONIA in the last 168 hours. Coagulation profile No results for input(s): INR, PROTIME in the last 168 hours.  CBC: Recent Labs  Lab 03/09/20 0337  WBC 9.2  HGB 11.5*  HCT 34.7*  MCV 97.5  PLT 327   Cardiac Enzymes: No results for input(s): CKTOTAL, CKMB, CKMBINDEX, TROPONINI in the last 168 hours. BNP: Invalid input(s): POCBNP CBG: No results for input(s): GLUCAP in the last 168 hours. D-Dimer No results for input(s): DDIMER in the last 72 hours. Hgb A1c No results for input(s): HGBA1C in the last 72 hours. Lipid Profile No results for input(s): CHOL, HDL, LDLCALC, TRIG, CHOLHDL, LDLDIRECT in the last 72 hours. Thyroid function studies No results for  input(s): TSH, T4TOTAL, T3FREE, THYROIDAB in the last 72 hours.  Invalid input(s): FREET3 Anemia work up No results for input(s): VITAMINB12, FOLATE, FERRITIN, TIBC, IRON, RETICCTPCT in the last 72 hours. Microbiology Recent Results (from the past 240 hour(s))  Urine culture     Status: None   Collection Time: 03/08/20  1:55 PM   Specimen: Urine, Random  Result Value Ref Range Status   Specimen Description URINE, RANDOM  Final   Special Requests NONE  Final   Culture   Final    NO GROWTH Performed at [facility], [address] [place], [place] [address]    Report Status 03/09/2020 FINAL  Final  SARS Coronavirus 2 by RT PCR (hospital order, performed in [facility] hospital lab) Nasopharyngeal Nasopharyngeal Swab     Status: None    Collection Time: 03/08/20  7:02 PM   Specimen: Nasopharyngeal Swab  Result Value Ref Range Status   SARS Coronavirus 2 NEGATIVE NEGATIVE Final    Comment: (NOTE) SARS-CoV-2 target nucleic acids are NOT DETECTED. The SARS-CoV-2 RNA is generally detectable in upper and lower respiratory specimens during the acute phase of infection. The lowest concentration of SARS-CoV-2 viral copies this assay can detect is 250 copies / mL. A negative result does not preclude SARS-CoV-2 infection and should not be used as the sole basis for treatment or other patient management decisions.  A negative result may occur with improper specimen collection / handling, submission of specimen other than nasopharyngeal swab, presence of viral mutation(s) within the areas targeted by this assay, and inadequate number of viral copies (<250 copies / mL). A negative result must be combined with clinical observations, patient history, and epidemiological information. Fact Sheet for Patients:   StrictlyIdeas.no Fact Sheet for Healthcare Providers: BankingDealers.co.za This test is not yet approved or cleared  by the [place] FDA and has been authorized for detection and/or diagnosis of SARS-CoV-2 by FDA under an Emergency Use Authorization (EUA).  This EUA will remain in effect (meaning this test can be used) for the duration of the COVID-19 declaration under Section 564(b)(1) of the Act, 21 U.S.C. section 360bbb-3(b)(1), unless the authorization is terminated or revoked sooner. Performed at [facility], [place] [address], [place], [place] [address]   SARS Coronavirus 2 by RT PCR (hospital order, performed in [facility] hospital lab) Nasopharyngeal Nasopharyngeal Swab     Status: None   Collection Time: 03/12/20  8:43 AM   Specimen: Nasopharyngeal Swab  Result Value Ref Range Status   SARS Coronavirus 2 NEGATIVE NEGATIVE Final    Comment: (NOTE) SARS-CoV-2  target nucleic acids are NOT DETECTED. The SARS-CoV-2 RNA is generally detectable in upper and lower respiratory specimens during the acute phase of infection. The lowest concentration of SARS-CoV-2 viral copies this assay can detect is 250 copies / mL. A negative result does not preclude SARS-CoV-2 infection and should not be used as the sole basis for treatment or other patient management decisions.  A negative result may occur with improper specimen collection / handling, submission of specimen other than nasopharyngeal swab, presence of viral mutation(s) within the areas targeted by this assay, and inadequate number of viral copies (<250 copies / mL). A negative result must be combined with clinical observations, patient history, and epidemiological information. Fact Sheet for Patients:   StrictlyIdeas.no Fact Sheet for Healthcare Providers: BankingDealers.co.za This test is not yet approved or cleared  by the [place] FDA and has been authorized for detection and/or diagnosis  of SARS-CoV-2 by FDA under an Emergency Use Authorization (EUA).  This EUA will remain in effect (meaning this test can be used) for the duration of the COVID-19 declaration under Section 564(b)(1) of the Act, 21 U.S.C. section 360bbb-3(b)(1), unless the authorization is terminated or revoked sooner. Performed at [facility], [place] [address], [place], [place] [address]      Discharge Instructions:   Discharge Instructions    Diet - low sodium heart healthy   Complete by: As directed    Diet general   Complete by: As directed    Discharge instructions   Complete by: As directed    Recommend TLSO brace when out of bed for comfort and pain management.  Please follow up with Dr. Arnoldo Morale in 6 weeks as an outpatient  Please call call MD or return to ER for similar or worsening recurring problem that brought you to hospital or if any  fever,nausea/vomiting,abdominal pain, uncontrolled pain, chest pain,  shortness of breath or any other alarming symptoms.  Please follow-up your doctor as instructed in a week time and call the office for appointment.  Please avoid alcohol, smoking, or any other illicit substance and maintain healthy habits including taking your regular medications as prescribed.  You were cared for by a hospitalist during your hospital stay. If you have any questions about your discharge medications or the care you received while you were in the hospital after you are discharged, you can call the unit and ask to speak with the hospitalist on call if the hospitalist that took care of you is not available.  Once you are discharged, your primary care physician will handle any further medical issues. Please note that NO REFILLS for any discharge medications will be authorized once you are discharged, as it is imperative that you return to your primary care physician (or establish a relationship with a primary care physician if you do not have one) for your aftercare needs so that they can reassess your need for medications and monitor your lab values   Increase activity slowly   Complete by: As directed    Increase activity slowly   Complete by: As directed      Allergies as of 03/15/2020      Reactions   Sulfamethoxazole Diarrhea, Other (See Comments)   GI UPSET      Medication List    STOP taking these medications   acetaminophen 650 MG CR tablet Commonly known as: TYLENOL Replaced by: acetaminophen 500 MG tablet   Percocet 5-325 MG tablet Generic drug: oxyCODONE-acetaminophen   traMADol 50 MG tablet Commonly known as: ULTRAM     TAKE these medications   acetaminophen 500 MG tablet Commonly known as: TYLENOL Take 2 tablets (1,000 mg total) by mouth in the morning, at noon, and at bedtime. Replaces: acetaminophen 650 MG CR tablet   atorvastatin 20 MG tablet Commonly known as: LIPITOR Take 20 mg  by mouth at bedtime.   CALCIUM CITRATE PO Take 1 tablet by mouth daily.   COD LIVER OIL PO Take 1 tablet by mouth daily.   COSAMIN DS PO Take 1 tablet by mouth 2 (two) times daily. 1000mg  daily   docusate sodium 100 MG capsule Commonly known as: COLACE Take 1 capsule (100 mg total) by mouth 2 (two) times daily.   ICAPS PO Take 1 tablet by mouth 2 (two) times daily.   IRON 27 PO Take 1 tablet by mouth at bedtime.   levothyroxine 25 MCG tablet  Commonly known as: SYNTHROID Take 1 tablet (25 mcg total) by mouth daily at 6 (six) AM.   lidocaine 5 % Commonly known as: LIDODERM Place 1 patch onto the skin daily. Remove & Discard patch within 12 hours or as directed by MD   lisinopril 40 MG tablet Commonly known as: ZESTRIL Take 1 tablet (40 mg total) by mouth daily. What changed:   medication strength  how much to take   Magnesium 250 MG Tabs Take 250 mg by mouth at bedtime.   meloxicam 7.5 MG tablet Commonly known as: MOBIC Take 7.5 mg by mouth daily.   methocarbamol 500 MG tablet Commonly known as: ROBAXIN Take 1 tablet (500 mg total) by mouth every 6 (six) hours as needed for muscle spasms.   omeprazole 40 MG capsule Commonly known as: PRILOSEC Take 40 mg by mouth daily.   ondansetron 4 MG tablet Commonly known as: ZOFRAN Take 4 mg by mouth every 8 (eight) hours as needed for nausea/vomiting.   oxyCODONE 5 MG immediate release tablet Commonly known as: Oxy IR/ROXICODONE Take 1-2 tablets (5-10 mg total) by mouth every 6 (six) hours as needed for severe pain.   PARoxetine 10 MG tablet Commonly known as: PAXIL Take 10 mg by mouth at bedtime.   polyethylene glycol 17 g packet Commonly known as: MIRALAX / GLYCOLAX Take 17 g by mouth daily as needed for moderate constipation.   psyllium 58.6 % packet Commonly known as: METAMUCIL Take 1 packet by mouth daily at 12 noon.   senna 8.6 MG Tabs tablet Commonly known as: SENOKOT Take 1 tablet (8.6 mg total)  by mouth 2 (two) times daily.   Systane 0.4-0.3 % Soln Generic drug: Polyethyl Glycol-Propyl Glycol Place 1 drop into both eyes 2 (two) times daily as needed (for tired/dry eyes.).            Durable Medical Equipment  (From admission, onward)         Start     Ordered   03/14/20 1657  For home use only DME 3 n 1  Once     03/14/20 1656   03/14/20 1657  For home use only DME Hospital bed  Once    Question Answer Comment  Length of Need 12 Months   Patient has (list medical condition): T12 closed fracture   The above medical condition requires: Patient requires the ability to reposition frequently   Head must be elevated greater than: 30 degrees   Bed type Semi-electric   Support Surface: Gel Overlay      03/14/20 1656         Follow-up Information    Myrlene Broker, MD Follow up in 1 week(s).   Specialty: Family Medicine Contact information: [place]  [address] [phone]        Newman Pies, MD Follow up in 2 week(s).   Specialty: Neurosurgery Contact information: [address] [place] [address] [phone]        Care, [facility] Follow up.   Specialty: Home Health Services Why: Alvis Lemmings will provide Home health PT/OT/aide, and MSW.  You should receive a call within 24-48 hours for therapy and services to start. Contact information: [place] [address] [phone]        [place] .        [facility], [facility] Follow up.   Why: [employer] will contact you regarding delivery of a hospital bed  and 3:1 commode. Contact information: [address] [place] [address] [phone]            Time coordinating discharge: 35 min  Signed:  Geradine Girt DO  [facility] 03/15/2020, 2:34 PM

## 2020-03-15 NOTE — Progress Notes (Signed)
Occupational Therapy Treatment Patient Details Name: [name] MRN: [id number] DOB: [birth year] Today's Date: 03/15/2020    History of present illness Pt is a 86 y/o female who presents s/p fall at home, down 2-3 hours. Pt sustained T11-T12 fractures and is being treated without surgical intervention at this time. PMH signifcant for DMII, osteoporosis, migraine, macular degeneration in both eyes, HTN, pelvic fracture 2017, heart murmur, R TKR, kyphoplasty.   OT comments  This 86 yo female admitted with above presents to acute OT with family education this session due to patient to be D/C'd home today with family and [place] therapy services. All current family questions answered, we will continue to follow in case D/C is delayed.  Follow Up Recommendations  SNF;Supervision/Assistance - 24 hour(family prefers home)    Equipment Recommendations  3 in 1 bedside commode;Hospital bed       Precautions / Restrictions Precautions Precautions: Fall;Back Precaution Comments: Back precautions for comfort Required Braces or Orthoses: Spinal Brace(for comfort and pain management per neurosurgery note) Spinal Brace: Thoracolumbosacral orthotic Restrictions Weight Bearing Restrictions: No              ADL either performed or assessed with clinical judgement   ADL                                         General ADL Comments: In starting to work with patient and daughter came in. Pt to D/C home today with family care, [place] therapy , hospital bed and 3n1. Made dtr aware she will need long fitted sheets, needs to check with local medical supply store or online for washable bed pads, make sure she adjusts the bed height to care for her mom so that she does not stress out her own back, gave her a bedpan to take home with her, talked about possibly needing a hoyer lift (but wants to try without one for now), and made her aware that per neurosurgery note brace is for comfort and pain  control (per dter she feels that the brace is acutually causing more pain than it is helping with).     Vision Baseline Vision/History: No visual deficits            Cognition Arousal/Alertness: Awake/alert Behavior During Therapy: WFL for tasks assessed/performed                                                       Pertinent Vitals/ Pain       Pain Assessment: Faces Pain Score: 8  Pain Location: back Pain Descriptors / Indicators: Spasm Pain Intervention(s): (pt had pain meds before I arrived, but not her muscle relaxer (made RN aware pt would like her muscle relaxer))         Frequency  Min 2X/week        Progress Toward Goals  OT Goals(current goals can now be found in the care plan section)  Progress towards OT goals: Not progressing toward goals - comment(increased pain today)  Acute Rehab OT Goals Patient Stated Goal: to feel better (less pain) and go home OT Goal Formulation: With patient/family Time For Goal Achievement: 03/23/20 Potential to Achieve Goals: Good  Plan Discharge plan remains appropriate(but family prefers to take  pateint home with home resources set up)       AM-PAC OT "6 Clicks" Daily Activity     Outcome Measure   Help from another person eating meals?: A Little Help from another person taking care of personal grooming?: A Little Help from another person toileting, which includes using toliet, bedpan, or urinal?: A Lot Help from another person bathing (including washing, rinsing, drying)?: A Lot Help from another person to put on and taking off regular upper body clothing?: A Lot Help from another person to put on and taking off regular lower body clothing?: Total 6 Click Score: 13    End of Session    OT Visit Diagnosis: Other abnormalities of gait and mobility (R26.89);Pain Pain - part of body: (back)   Activity Tolerance Patient limited by pain   Patient Left in bed;with family/visitor present(dtr  reported she would be with patient all day until she was D/C'd so bed alarm not set)   Nurse Communication (patient requests muscle relaxer)        Time: 4585-9292 OT Time Calculation (min): 17 min  Charges: OT General Charges $OT Visit: 1 Visit OT Treatments $Self Care/Home Management : 8-22 mins  [place], OTR/L Acute [employer] Pager [phone] Office [phone]     Almon Register 03/15/2020, 5:27 PM

## 2020-03-15 NOTE — Progress Notes (Signed)
Patient discharged to home via PTAR,  VSS, family  Were at bed with no questions. IV out, pain med given before departure.

## 2020-03-15 NOTE — Discharge Instructions (Signed)
Thoracic Spine Fracture A thoracic spine fracture is a break in one of the bones of the middle part of the back. The fracture can be mild or very bad. The most serious types cause the broken bones to:  Move out of place (unstable).  Damage or press on the main nerve in the spine (spinal cord). In some cases, the bone that connects to the lower part of the back may also have a break (thoracolumbar fracture). What are the causes? This condition may be caused by:  A car accident.  A fall.  A sports accident.  Violent acts. These include assaults or gunshots. What are the signs or symptoms? Symptoms may include:  Back pain.  Trouble standing or walking.  Numbness.  Tingling.  Weakness.  Loss of movement.  Being unable to control when to pee or poop (incontinence). How is this treated? Treatment may include:  Medicines.  A cast or a brace.  Physical therapy.  Surgery. This may be needed for very bad fractures. Follow these instructions at home: Medicines  Take medicines only as told by your doctor.  Do not drive or use heavy machinery while taking pain medicine.  To prevent or treat trouble pooping (constipation) while you are taking prescription pain medicine, your doctor may recommend that you: ? Drink enough fluid to keep your pee (urine) pale yellow. ? Take over-the-counter or prescription medicines. ? Eat foods that are high in fiber. This includes fresh fruits and vegetables, whole grains, and beans. ? Limit foods that are high in fat and processed sugars. This includes fried or sweet foods. If you have a brace:  Wear the back brace as told by your doctor. Remove it only as told by your doctor.  Keep the brace clean.  If the brace is not waterproof: ? Do not let it get wet. ? Cover it with a watertight covering when you take a bath or a shower. Activity  Stay in bed (on bed rest) only as told by your doctor.  Ask your doctor what is safe for you to  do.  Return to your normal activities as told by your doctor.  Do back exercises (physical therapy) as told by your doctor.  Exercise often as told by your doctor. Managing pain, stiffness, and swelling   If told, put ice on the injured area: ? Put ice in a plastic bag. ? Place a towel between your skin and the bag. ? Leave the ice on for 20 minutes, 2-3 times a day. General instructions  Do not use any products that contain nicotine or tobacco, such as cigarettes and e-cigarettes. If you need help quitting, ask your doctor.  Do not drink alcohol.  Keep all follow-up visits as told by your doctor. This is important. Contact a doctor if:  You have a fever.  You have a cough that makes your pain worse.  Your pain medicine is not helping.  Your pain does not get better over time.  You cannot return to your normal activities as planned. Get help right away if:  Your pain is bad and it suddenly gets worse.  You are not able to move any part of your body (paralysis) that is below the level of your injury.  You have numbness, tingling, or weakness in any part of your body that is below the level of your injury.  You cannot control when you pee (urinate) or when you poop (pass stool). Summary  A thoracic spine fracture is a break  in one of the bones of the middle part of the back.  A stable fracture can be treated with a back brace, activity restrictions, pain medicine, and physical therapy. A more severe fracture may require surgery.  Make sure you know what symptoms should cause you to get help right away. This information is not intended to replace advice given to you by your health care provider. Make sure you discuss any questions you have with your health care provider. Document Revised: 11/08/2017 Document Reviewed: 11/08/2017 Elsevier Patient Education  2020 [employer].

## 2021-01-31 ENCOUNTER — Inpatient Hospital Stay (HOSPITAL_COMMUNITY): Payer: Medicare Other

## 2021-01-31 ENCOUNTER — Emergency Department (HOSPITAL_COMMUNITY): Payer: Medicare Other

## 2021-01-31 ENCOUNTER — Inpatient Hospital Stay (HOSPITAL_COMMUNITY)
Admission: EM | Admit: 2021-01-31 | Discharge: 2021-02-08 | DRG: 481 | Disposition: A | Discharge status: Skilled Nursing Facility | Payer: Medicare Other | Attending: Internal Medicine | Admitting: Internal Medicine

## 2021-01-31 ENCOUNTER — Encounter (HOSPITAL_COMMUNITY): Payer: Self-pay | Admitting: Internal Medicine

## 2021-01-31 ENCOUNTER — Other Ambulatory Visit: Payer: Self-pay

## 2021-01-31 DIAGNOSIS — Y92009 Unspecified place in unspecified non-institutional (private) residence as the place of occurrence of the external cause: Secondary | ICD-10-CM | POA: Diagnosis not present

## 2021-01-31 DIAGNOSIS — F419 Anxiety disorder, unspecified: Secondary | ICD-10-CM | POA: Diagnosis present

## 2021-01-31 DIAGNOSIS — S72141K Displaced intertrochanteric fracture of right femur, subsequent encounter for closed fracture with nonunion: Secondary | ICD-10-CM | POA: Diagnosis not present

## 2021-01-31 DIAGNOSIS — W010XXA Fall on same level from slipping, tripping and stumbling without subsequent striking against object, initial encounter: Secondary | ICD-10-CM | POA: Diagnosis present

## 2021-01-31 DIAGNOSIS — S72141A Displaced intertrochanteric fracture of right femur, initial encounter for closed fracture: Principal | ICD-10-CM

## 2021-01-31 DIAGNOSIS — K219 Gastro-esophageal reflux disease without esophagitis: Secondary | ICD-10-CM | POA: Diagnosis present

## 2021-01-31 DIAGNOSIS — Z20822 Contact with and (suspected) exposure to covid-19: Secondary | ICD-10-CM | POA: Diagnosis present

## 2021-01-31 DIAGNOSIS — I152 Hypertension secondary to endocrine disorders: Secondary | ICD-10-CM | POA: Diagnosis present

## 2021-01-31 DIAGNOSIS — E1159 Type 2 diabetes mellitus with other circulatory complications: Secondary | ICD-10-CM

## 2021-01-31 DIAGNOSIS — F32A Depression, unspecified: Secondary | ICD-10-CM | POA: Diagnosis present

## 2021-01-31 DIAGNOSIS — H3530 Unspecified macular degeneration: Secondary | ICD-10-CM | POA: Diagnosis present

## 2021-01-31 DIAGNOSIS — Z791 Long term (current) use of non-steroidal anti-inflammatories (NSAID): Secondary | ICD-10-CM | POA: Diagnosis not present

## 2021-01-31 DIAGNOSIS — Z96653 Presence of artificial knee joint, bilateral: Secondary | ICD-10-CM | POA: Diagnosis present

## 2021-01-31 DIAGNOSIS — Z79899 Other long term (current) drug therapy: Secondary | ICD-10-CM

## 2021-01-31 DIAGNOSIS — E7800 Pure hypercholesterolemia, unspecified: Secondary | ICD-10-CM | POA: Diagnosis present

## 2021-01-31 DIAGNOSIS — Y92008 Other place in unspecified non-institutional (private) residence as the place of occurrence of the external cause: Secondary | ICD-10-CM | POA: Diagnosis not present

## 2021-01-31 DIAGNOSIS — Z833 Family history of diabetes mellitus: Secondary | ICD-10-CM | POA: Diagnosis not present

## 2021-01-31 DIAGNOSIS — Z66 Do not resuscitate: Secondary | ICD-10-CM | POA: Diagnosis present

## 2021-01-31 DIAGNOSIS — Z90710 Acquired absence of both cervix and uterus: Secondary | ICD-10-CM | POA: Diagnosis not present

## 2021-01-31 DIAGNOSIS — R410 Disorientation, unspecified: Secondary | ICD-10-CM | POA: Diagnosis not present

## 2021-01-31 DIAGNOSIS — Z419 Encounter for procedure for purposes other than remedying health state, unspecified: Secondary | ICD-10-CM

## 2021-01-31 DIAGNOSIS — D62 Acute posthemorrhagic anemia: Secondary | ICD-10-CM | POA: Diagnosis not present

## 2021-01-31 DIAGNOSIS — S72001A Fracture of unspecified part of neck of right femur, initial encounter for closed fracture: Secondary | ICD-10-CM

## 2021-01-31 DIAGNOSIS — E119 Type 2 diabetes mellitus without complications: Secondary | ICD-10-CM

## 2021-01-31 DIAGNOSIS — S72009A Fracture of unspecified part of neck of unspecified femur, initial encounter for closed fracture: Secondary | ICD-10-CM | POA: Diagnosis present

## 2021-01-31 DIAGNOSIS — E1169 Type 2 diabetes mellitus with other specified complication: Secondary | ICD-10-CM | POA: Diagnosis present

## 2021-01-31 DIAGNOSIS — E785 Hyperlipidemia, unspecified: Secondary | ICD-10-CM

## 2021-01-31 DIAGNOSIS — W19XXXA Unspecified fall, initial encounter: Secondary | ICD-10-CM

## 2021-01-31 DIAGNOSIS — M810 Age-related osteoporosis without current pathological fracture: Secondary | ICD-10-CM | POA: Diagnosis present

## 2021-01-31 DIAGNOSIS — E039 Hypothyroidism, unspecified: Secondary | ICD-10-CM | POA: Diagnosis present

## 2021-01-31 DIAGNOSIS — M25551 Pain in right hip: Secondary | ICD-10-CM | POA: Diagnosis present

## 2021-01-31 DIAGNOSIS — Z79890 Hormone replacement therapy (postmenopausal): Secondary | ICD-10-CM | POA: Diagnosis not present

## 2021-01-31 DIAGNOSIS — R0902 Hypoxemia: Secondary | ICD-10-CM

## 2021-01-31 DIAGNOSIS — S72001D Fracture of unspecified part of neck of right femur, subsequent encounter for closed fracture with routine healing: Secondary | ICD-10-CM | POA: Diagnosis not present

## 2021-01-31 LAB — CBC
HCT: 39 % (ref 36.0–46.0)
Hemoglobin: 12.1 g/dL (ref 12.0–15.0)
MCH: 32.4 pg (ref 26.0–34.0)
MCHC: 31 g/dL (ref 30.0–36.0)
MCV: 104.3 fL — ABNORMAL HIGH (ref 80.0–100.0)
Platelets: 286 10*3/uL (ref 150–400)
RBC: 3.74 MIL/uL — ABNORMAL LOW (ref 3.87–5.11)
RDW: 13.4 % (ref 11.5–15.5)
WBC: 8.3 10*3/uL (ref 4.0–10.5)
nRBC: 0 % (ref 0.0–0.2)

## 2021-01-31 LAB — RESP PANEL BY RT-PCR (FLU A&B, COVID) ARPGX2
Influenza A by PCR: NEGATIVE
Influenza B by PCR: NEGATIVE
SARS Coronavirus 2 by RT PCR: NEGATIVE

## 2021-01-31 LAB — TSH: TSH: 1.72 u[IU]/mL (ref 0.350–4.500)

## 2021-01-31 LAB — COMPREHENSIVE METABOLIC PANEL
ALT: 19 U/L (ref 0–44)
AST: 24 U/L (ref 15–41)
Albumin: 3.5 g/dL (ref 3.5–5.0)
Alkaline Phosphatase: 57 U/L (ref 38–126)
Anion gap: 8 (ref 5–15)
BUN: 27 mg/dL — ABNORMAL HIGH (ref 8–23)
CO2: 24 mmol/L (ref 22–32)
Calcium: 9.2 mg/dL (ref 8.9–10.3)
Chloride: 107 mmol/L (ref 98–111)
Creatinine, Ser: 0.9 mg/dL (ref 0.44–1.00)
GFR, Estimated: 59 mL/min — ABNORMAL LOW (ref >60)
Glucose, Bld: 155 mg/dL — ABNORMAL HIGH (ref 70–99)
Potassium: 4.2 mmol/L (ref 3.5–5.1)
Sodium: 139 mmol/L (ref 135–145)
Total Bilirubin: 0.8 mg/dL (ref 0.3–1.2)
Total Protein: 6.5 g/dL (ref 6.5–8.1)

## 2021-01-31 LAB — SURGICAL PCR SCREEN
MRSA, PCR: NEGATIVE
Staphylococcus aureus: NEGATIVE

## 2021-01-31 MED ORDER — CEFAZOLIN SODIUM-DEXTROSE 2-4 GM/100ML-% IV SOLN: 2.0000 g | INTRAVENOUS | Status: DC

## 2021-01-31 MED ORDER — DOCUSATE SODIUM 100 MG PO CAPS: 100.0000 mg | ORAL_CAPSULE | Freq: Two times a day (BID) | ORAL | Status: DC | PRN

## 2021-01-31 MED ORDER — MORPHINE SULFATE (PF) 2 MG/ML IV SOLN
0.5000 mg | INTRAVENOUS | Status: DC | PRN
  Administered 2021-02-05: 0.5 mg via INTRAVENOUS
  Filled 2021-01-31: qty 1

## 2021-01-31 MED ORDER — HYDROCODONE-ACETAMINOPHEN 5-325 MG PO TABS
1.0000 | ORAL_TABLET | Freq: Four times a day (QID) | ORAL | Status: DC | PRN
  Administered 2021-01-31 – 2021-02-01 (×2): 2 via ORAL
  Administered 2021-02-02 – 2021-02-03 (×2): 1 via ORAL
  Filled 2021-01-31: qty 2
  Filled 2021-01-31 (×4): qty 1

## 2021-01-31 MED ORDER — POVIDONE-IODINE 10 % EX SWAB: 2.0000 "application " | Freq: Once | CUTANEOUS | Status: DC

## 2021-01-31 MED ORDER — PANTOPRAZOLE SODIUM 40 MG PO TBEC
40.0000 mg | DELAYED_RELEASE_TABLET | Freq: Every day | ORAL | Status: DC
  Administered 2021-02-01 – 2021-02-08 (×8): 40 mg via ORAL
  Filled 2021-01-31 (×8): qty 1

## 2021-01-31 MED ORDER — CEFAZOLIN SODIUM-DEXTROSE 2-4 GM/100ML-% IV SOLN
2.0000 g | INTRAVENOUS | Status: AC
  Administered 2021-02-01: 2 g via INTRAVENOUS
  Filled 2021-01-31: qty 100

## 2021-01-31 MED ORDER — POLYETHYLENE GLYCOL 3350 17 G PO PACK
17.0000 g | PACK | Freq: Every day | ORAL | Status: DC
  Administered 2021-02-02 – 2021-02-03 (×2): 17 g via ORAL
  Filled 2021-01-31 (×2): qty 1

## 2021-01-31 MED ORDER — DICLOFENAC SODIUM 1 % EX GEL
2.0000 g | Freq: Every day | CUTANEOUS | Status: DC
  Administered 2021-02-05 – 2021-02-07 (×3): 2 g via TOPICAL
  Filled 2021-01-31: qty 100

## 2021-01-31 MED ORDER — HYDROMORPHONE HCL 1 MG/ML IJ SOLN
0.5000 mg | Freq: Once | INTRAMUSCULAR | Status: AC
  Administered 2021-01-31: 0.5 mg via INTRAVENOUS
  Filled 2021-01-31: qty 1

## 2021-01-31 MED ORDER — LISINOPRIL 20 MG PO TABS
40.0000 mg | ORAL_TABLET | Freq: Every day | ORAL | Status: DC
  Administered 2021-02-01 – 2021-02-03 (×3): 40 mg via ORAL
  Filled 2021-01-31 (×3): qty 2

## 2021-01-31 MED ORDER — POLYVINYL ALCOHOL 1.4 % OP SOLN
1.0000 [drp] | Freq: Every day | OPHTHALMIC | Status: DC
  Administered 2021-01-31 – 2021-02-07 (×7): 1 [drp] via OPHTHALMIC
  Filled 2021-01-31: qty 15

## 2021-01-31 MED ORDER — MIRTAZAPINE 15 MG PO TBDP
15.0000 mg | ORAL_TABLET | Freq: Every day | ORAL | Status: DC
  Administered 2021-01-31 – 2021-02-07 (×7): 15 mg via ORAL
  Filled 2021-01-31 (×8): qty 1

## 2021-01-31 MED ORDER — LIDOCAINE 5 % EX PTCH: 1.0000 | MEDICATED_PATCH | Freq: Every day | CUTANEOUS | Status: DC | PRN

## 2021-01-31 MED ORDER — ACETAMINOPHEN 500 MG PO TABS
500.0000 mg | ORAL_TABLET | Freq: Four times a day (QID) | ORAL | Status: DC | PRN
  Filled 2021-01-31 (×2): qty 1

## 2021-01-31 MED ORDER — MUPIROCIN 2 % EX OINT
1.0000 "application " | TOPICAL_OINTMENT | Freq: Two times a day (BID) | CUTANEOUS | Status: DC
  Administered 2021-01-31: 1 via NASAL
  Filled 2021-01-31: qty 22

## 2021-01-31 MED ORDER — SENNOSIDES-DOCUSATE SODIUM 8.6-50 MG PO TABS
1.0000 | ORAL_TABLET | Freq: Every day | ORAL | Status: DC
  Administered 2021-02-01 – 2021-02-06 (×4): 1 via ORAL
  Filled 2021-01-31 (×7): qty 1

## 2021-01-31 MED ORDER — MELOXICAM 7.5 MG PO TABS
7.5000 mg | ORAL_TABLET | Freq: Every day | ORAL | Status: DC
  Administered 2021-02-01 – 2021-02-08 (×8): 7.5 mg via ORAL
  Filled 2021-01-31 (×8): qty 1

## 2021-01-31 MED ORDER — TRANEXAMIC ACID-NACL 1000-0.7 MG/100ML-% IV SOLN
1000.0000 mg | INTRAVENOUS | Status: AC
  Administered 2021-02-01: 1000 mg via INTRAVENOUS
  Filled 2021-01-31: qty 100

## 2021-01-31 MED ORDER — SODIUM CHLORIDE 0.9 % IV BOLUS
500.0000 mL | Freq: Once | INTRAVENOUS | Status: AC
  Administered 2021-01-31: 500 mL via INTRAVENOUS

## 2021-01-31 MED ORDER — HYDRALAZINE HCL 20 MG/ML IJ SOLN: 5.0000 mg | INTRAMUSCULAR | Status: DC | PRN

## 2021-01-31 MED ORDER — PAROXETINE HCL 10 MG PO TABS
10.0000 mg | ORAL_TABLET | Freq: Every day | ORAL | Status: DC
  Administered 2021-01-31 – 2021-02-07 (×7): 10 mg via ORAL
  Filled 2021-01-31 (×8): qty 1

## 2021-01-31 MED ORDER — FENTANYL CITRATE (PF) 100 MCG/2ML IJ SOLN
50.0000 ug | Freq: Once | INTRAMUSCULAR | Status: AC
  Administered 2021-01-31: 50 ug via INTRAVENOUS
  Filled 2021-01-31: qty 2

## 2021-01-31 MED ORDER — ENOXAPARIN SODIUM 40 MG/0.4ML ~~LOC~~ SOLN
40.0000 mg | SUBCUTANEOUS | Status: DC
  Administered 2021-01-31 – 2021-02-02 (×2): 40 mg via SUBCUTANEOUS
  Filled 2021-01-31: qty 0.4

## 2021-01-31 MED ORDER — PSYLLIUM 28 % PO PACK
1.0000 | PACK | Freq: Every day | ORAL | Status: DC
  Filled 2021-01-31: qty 30

## 2021-01-31 MED ORDER — CHLORHEXIDINE GLUCONATE 4 % EX LIQD
60.0000 mL | Freq: Once | CUTANEOUS | Status: AC
  Administered 2021-02-01: 4 via TOPICAL
  Filled 2021-01-31: qty 60

## 2021-01-31 MED ORDER — CHLORHEXIDINE GLUCONATE 4 % EX LIQD: 60.0000 mL | Freq: Once | CUTANEOUS | Status: DC

## 2021-01-31 MED ORDER — ATORVASTATIN CALCIUM 10 MG PO TABS
20.0000 mg | ORAL_TABLET | Freq: Every day | ORAL | Status: DC
  Administered 2021-01-31 – 2021-02-07 (×7): 20 mg via ORAL
  Filled 2021-01-31 (×7): qty 2

## 2021-01-31 NOTE — Consult Note (Signed)
Reason for Consult:Right hip fx Referring Physician: [place] Time called: [phone] Time at bedside: [place] is an 87 y.o. female.  HPI: [name] was at home and trying to get through a door when she lost her balance and fell onto her RW. She had immediate right hip pain and could not get up. She was brought to the ED where x-rays showed a hip fx and orthopedic surgery was consulted. She lives at home with assistance.  Past Medical History:  Diagnosis Date  . Anemia   . Anxiety   . Arthritis    "all over" (04/23/2017)  . Cataract 2015   bilateral cat. extr.  . Depression   . GERD (gastroesophageal reflux disease)   . Heart murmur   . High cholesterol   . History of pelvic fracture 03/2016  . Hypertension   . Macular degeneration of both eyes   . Migraine    "started when I was in the 9th grade; lasted a couple years; started back in my 54's; had one daily right before OR" (04/23/2017)  . Osteoporosis   . Type II diabetes mellitus ([place])    "took Metformin for awhile; made me sick; diabetes is diet controlled" (04/23/2017)    Past Surgical History:  Procedure Laterality Date  . ABDOMINAL HYSTERECTOMY    . APPENDECTOMY    . BREAST CYST EXCISION Bilateral    "all benign"  . CARPAL TUNNEL RELEASE Right   . CATARACT EXTRACTION W/ INTRAOCULAR LENS  IMPLANT, BILATERAL Bilateral   . DILATION AND CURETTAGE OF UTERUS    . JOINT REPLACEMENT    . KYPHOPLASTY    . LAPAROSCOPIC CHOLECYSTECTOMY    . TONSILLECTOMY AND ADENOIDECTOMY     as child  . TOTAL KNEE ARTHROPLASTY Right 04/22/2017  . TOTAL KNEE ARTHROPLASTY Right 04/22/2017   Procedure: TOTAL KNEE ARTHROPLASTY;  Surgeon: Vickey Huger, MD;  Location: [facility];  Service: Orthopedics;  Laterality: Right;    Family History  Problem Relation Age of Onset  . Colon cancer Father   . Diabetes Father   . Pancreatic cancer Father   . Colon cancer Sister     Social History:  reports that she has never smoked. She has never used  smokeless tobacco. She reports previous alcohol use. She reports that she does not use drugs.  Allergies:  Allergies  Allergen Reactions  . Sulfamethoxazole Diarrhea and Other (See Comments)    GI UPSET    Medications: I have reviewed the patient's current medications.  Results for orders placed or performed during the hospital encounter of 01/31/21 (from the past 48 hour(s))  CBC     Status: Abnormal   Collection Time: 01/31/21  9:42 AM  Result Value Ref Range   WBC 8.3 4.0 - 10.5 K/uL   RBC 3.74 (L) 3.87 - 5.11 MIL/uL   Hemoglobin 12.1 12.0 - 15.0 g/dL   HCT 39.0 36.0 - 46.0 %   MCV 104.3 (H) 80.0 - 100.0 fL   MCH 32.4 26.0 - 34.0 pg   MCHC 31.0 30.0 - 36.0 g/dL   RDW 13.4 11.5 - 15.5 %   Platelets 286 150 - 400 K/uL   nRBC 0.0 0.0 - 0.2 %    Comment: Performed at [facility], [place] [address] [place], [place] [address]  Comprehensive metabolic panel     Status: Abnormal   Collection Time: 01/31/21  9:42 AM  Result Value Ref Range   Sodium 139 135 - 145  mmol/L   Potassium 4.2 3.5 - 5.1 mmol/L   Chloride 107 98 - 111 mmol/L   CO2 24 22 - 32 mmol/L   Glucose, Bld 155 (H) 70 - 99 mg/dL    Comment: Glucose reference range applies only to samples taken after fasting for at least 8 hours.   BUN 27 (H) 8 - 23 mg/dL   Creatinine, Ser 0.90 0.44 - 1.00 mg/dL   Calcium 9.2 8.9 - 10.3 mg/dL   Total Protein 6.5 6.5 - 8.1 g/dL   Albumin 3.5 3.5 - 5.0 g/dL   AST 24 15 - 41 U/L   ALT 19 0 - 44 U/L   Alkaline Phosphatase 57 38 - 126 U/L   Total Bilirubin 0.8 0.3 - 1.2 mg/dL   GFR, Estimated 59 (L) >60 mL/min    Comment: (NOTE) Calculated using the CKD-EPI Creatinine Equation (2021)    Anion gap 8 5 - 15    Comment: Performed at [facility] [address], [place], [place] [address]    DG Chest 1 View  Result Date: 01/31/2021 CLINICAL DATA:  Fall. EXAM: CHEST  1 VIEW COMPARISON:  None. FINDINGS: Similar cardiomediastinal silhouette. Aortic atherosclerosis. Both  lungs are clear. No visible pleural effusions or pneumothorax on this single AP radiograph. No evidence of acute osseous abnormality. Polyarticular degenerative change. IMPRESSION: No evidence of acute cardiopulmonary disease. Electronically Signed   By: Margaretha Sheffield MD   On: 01/31/2021 10:45   DG Lumbar Spine Complete  Result Date: 01/31/2021 CLINICAL DATA:  87 year old female status post fall at home. Back pain. EXAM: LUMBAR SPINE - COMPLETE 4+ VIEW COMPARISON:  Lumbar spine and chest CT 03/08/2020. FINDINGS: Chronic T11 and T12 vertebral fractures with progressive kyphotic deformity since last year. Previously augmented T12 level as before. No associated spondylolisthesis is evident. L1 through L5 appear stable and intact. L1 benign vertebral body hemangioma better demonstrated by CT. Stable lumbar lordosis. Chronic lumbar disc and endplate degeneration maximal at L1-L2. Other visible thoracic levels appear grossly intact. Grossly stable visible pelvis. Stable cholecystectomy clips. Calcified aortic atherosclerosis. IMPRESSION: 1. No acute osseous abnormality identified in the lumbar spine. 2. Expected evolution of the T11 and T12 vertebral fractures since last year with associated progressive kyphosis there. 3. Chronic lumbar spine degeneration. Aortic Atherosclerosis (ICD10-I70.0). Electronically Signed   By: Genevie Ann M.D.   On: 01/31/2021 10:48   CT Head Wo Contrast  Result Date: 01/31/2021 CLINICAL DATA:  87 year old female status post fall at home. EXAM: CT HEAD WITHOUT CONTRAST CT CERVICAL SPINE WITHOUT CONTRAST TECHNIQUE: Multidetector CT imaging of the head and cervical spine was performed following the standard protocol without intravenous contrast. Multiplanar CT image reconstructions of the cervical spine were also generated. COMPARISON:  Brain MRI 11/05/2005.  Head CT 02/29/2020. Thoracic spine MRI 03/10/2020. FINDINGS: CT HEAD FINDINGS Brain: Stable cerebral volume since last year. No  midline shift, ventriculomegaly, mass effect, evidence of mass lesion, intracranial hemorrhage or evidence of cortically based acute infarction. Patchy and confluent bilateral white matter hypodensity appears stable. No cortical encephalomalacia identified. Vascular: Calcified atherosclerosis at the skull base. No suspicious intracranial vascular hyperdensity. Skull: No fracture identified. Sinuses/Orbits: Bilateral middle ear and mastoid opacification is new from last year. Negative visible nasopharynx. Paranasal sinuses remain well aerated. Other: No acute orbit or scalp soft tissue finding. CT CERVICAL SPINE FINDINGS Alignment: Preserved cervical lordosis. Cervicothoracic junction alignment is within normal limits. Bilateral posterior element alignment is within normal limits. Skull base and vertebrae: Osteopenia.  Visualized skull base is intact. No atlanto-occipital dissociation. C1 and C2 appear intact and aligned. No acute osseous abnormality identified. Multilevel degenerative cervical spine ankylosis, including C7-T1 bilateral facet ankylosis. Soft tissues and spinal canal: No prevertebral fluid or swelling. No visible canal hematoma. Disc levels: Bulky degenerative partially calcified ligamentous hypertrophy about the odontoid. Degenerative subchondral cyst at the right base of the odontoid. Degenerative appearing ankylosis of C2 through C5. Advanced disc degeneration with vacuum dist at the unfused C6-C7 level. C7-T1 facet and interbody ankylosis. Upper chest: Visible upper thoracic levels appear stable and grossly intact. Stable right upper lobe lung nodule on series 9, image 88, benign. Calcified aortic atherosclerosis. IMPRESSION: 1. No acute traumatic injury identified in the head or cervical spine. 2. Stable non contrast CT appearance of the brain since last year. 3. New bilateral middle ear and mastoid opacification with no associated skull base fracture identified. Query bilateral otitis media. 4.  Multilevel cervical spine degeneration and ankylosis. 5. Aortic Atherosclerosis (ICD10-I70.0). Electronically Signed   By: Genevie Ann M.D.   On: 01/31/2021 10:57   CT Cervical Spine Wo Contrast  Result Date: 01/31/2021 CLINICAL DATA:  87 year old female status post fall at home. EXAM: CT HEAD WITHOUT CONTRAST CT CERVICAL SPINE WITHOUT CONTRAST TECHNIQUE: Multidetector CT imaging of the head and cervical spine was performed following the standard protocol without intravenous contrast. Multiplanar CT image reconstructions of the cervical spine were also generated. COMPARISON:  Brain MRI 11/05/2005.  Head CT 02/29/2020. Thoracic spine MRI 03/10/2020. FINDINGS: CT HEAD FINDINGS Brain: Stable cerebral volume since last year. No midline shift, ventriculomegaly, mass effect, evidence of mass lesion, intracranial hemorrhage or evidence of cortically based acute infarction. Patchy and confluent bilateral white matter hypodensity appears stable. No cortical encephalomalacia identified. Vascular: Calcified atherosclerosis at the skull base. No suspicious intracranial vascular hyperdensity. Skull: No fracture identified. Sinuses/Orbits: Bilateral middle ear and mastoid opacification is new from last year. Negative visible nasopharynx. Paranasal sinuses remain well aerated. Other: No acute orbit or scalp soft tissue finding. CT CERVICAL SPINE FINDINGS Alignment: Preserved cervical lordosis. Cervicothoracic junction alignment is within normal limits. Bilateral posterior element alignment is within normal limits. Skull base and vertebrae: Osteopenia. Visualized skull base is intact. No atlanto-occipital dissociation. C1 and C2 appear intact and aligned. No acute osseous abnormality identified. Multilevel degenerative cervical spine ankylosis, including C7-T1 bilateral facet ankylosis. Soft tissues and spinal canal: No prevertebral fluid or swelling. No visible canal hematoma. Disc levels: Bulky degenerative partially calcified  ligamentous hypertrophy about the odontoid. Degenerative subchondral cyst at the right base of the odontoid. Degenerative appearing ankylosis of C2 through C5. Advanced disc degeneration with vacuum dist at the unfused C6-C7 level. C7-T1 facet and interbody ankylosis. Upper chest: Visible upper thoracic levels appear stable and grossly intact. Stable right upper lobe lung nodule on series 9, image 88, benign. Calcified aortic atherosclerosis. IMPRESSION: 1. No acute traumatic injury identified in the head or cervical spine. 2. Stable non contrast CT appearance of the brain since last year. 3. New bilateral middle ear and mastoid opacification with no associated skull base fracture identified. Query bilateral otitis media. 4. Multilevel cervical spine degeneration and ankylosis. 5. Aortic Atherosclerosis (ICD10-I70.0). Electronically Signed   By: Genevie Ann M.D.   On: 01/31/2021 10:57   DG Hip Unilat W or Wo Pelvis 2-3 Views Right  Result Date: 01/31/2021 CLINICAL DATA:  87 year old female status post fall at home.  Pain. EXAM: DG HIP (WITH OR WITHOUT PELVIS) 2-3V RIGHT COMPARISON:  Hip series 02/29/2020.  FINDINGS: Highly comminuted right femur intertrochanteric fracture with varus impaction. The right femoral head remains normally located. Extensive chronic bilateral pubic symphysis fractures appear stable. Grossly intact proximal left femur. No other acute osseous abnormality identified about the pelvis. Negative visible bowel gas pattern. IMPRESSION: 1. Highly comminuted right femur intertrochanteric fracture with varus impaction. 2. Chronic bilateral pubic symphysis fractures. Electronically Signed   By: Genevie Ann M.D.   On: 01/31/2021 10:44    Review of Systems  HENT: Negative for ear discharge, ear pain, hearing loss and tinnitus.   Eyes: Negative for photophobia and pain.  Respiratory: Negative for cough and shortness of breath.   Cardiovascular: Negative for chest pain.  Gastrointestinal: Negative for  abdominal pain, nausea and vomiting.  Genitourinary: Negative for dysuria, flank pain, frequency and urgency.  Musculoskeletal: Positive for arthralgias (Right hip). Negative for back pain, myalgias and neck pain.  Neurological: Negative for dizziness and headaches.  Hematological: Does not bruise/bleed easily.  Psychiatric/Behavioral: The patient is not nervous/anxious.    Blood pressure (!) 158/67, pulse 81, temperature 98.1 F (36.7 C), temperature source Oral, resp. rate 12, height 5\' 5"  (1.651 m), weight 54.4 kg, SpO2 96 %. Physical Exam Constitutional:      General: She is not in acute distress.    Appearance: She is well-developed. She is not diaphoretic.  HENT:     Head: Normocephalic and atraumatic.  Eyes:     General: No scleral icterus.       Right eye: No discharge.        Left eye: No discharge.     Conjunctiva/sclera: Conjunctivae normal.  Cardiovascular:     Rate and Rhythm: Normal rate and regular rhythm.  Pulmonary:     Effort: Pulmonary effort is normal. No respiratory distress.  Musculoskeletal:     Cervical back: Normal range of motion.     Comments: LLE No traumatic wounds, ecchymosis, or rash  Mod TTP hip  No knee or ankle effusion  Knee stable to varus/ valgus and anterior/posterior stress  Sens DPN, SPN, TN grossly intact  Motor EHL, ext, flex, evers grossly intact  DP 2+, PT 1+, No significant edema  Skin:    General: Skin is warm and dry.  Neurological:     Mental Status: She is alert.  Psychiatric:        Behavior: Behavior normal.     Assessment/Plan: Right hip fx -- Plan IMN tomorrow with Dr. Doreatha Martin. Please keep NPO after MN. Multiple medical problems including anemia, anxiety, depression, GERD, hypertension, macular degeneration, and diabetes -- per primary service    Lisette Abu, PA-C Orthopedic Surgery [phone] 01/31/2021, 12:14 PM

## 2021-01-31 NOTE — Plan of Care (Signed)
  Problem: Education: Goal: Knowledge of General Education information will improve Description: Including pain rating scale, medication(s)/side effects and non-pharmacologic comfort measures Outcome: Progressing   Problem: Nutrition: Goal: Adequate nutrition will be maintained Outcome: Not Progressing   Problem: Coping: Goal: Level of anxiety will decrease Outcome: Not Progressing   Problem: Pain Managment: Goal: General experience of comfort will improve Outcome: Not Progressing   Problem: Safety: Goal: Ability to remain free from injury will improve Outcome: Not Progressing

## 2021-01-31 NOTE — ED Notes (Signed)
Patient transported to X-ray 

## 2021-01-31 NOTE — TOC Progression Note (Signed)
Transition of Care [facility]) - Progression Note    Patient Details  Name: [name] MRN: [id number] Date of Birth: May 21, 1923  Transition of Care [facility]) CM/SW Contact  Milinda Antis, [address] Phone Number: 01/31/2021, 3:33 PM  Clinical Narrative:     CSW following patient for any d/c planning needs once medically stable.  Lind Covert, MSW, LCSWA       Expected Discharge Plan and Services                                                 Social Determinants of Health (SDOH) Interventions    Readmission Risk Interventions Readmission Risk Prevention Plan 03/15/2020  Post Dischage Appt Complete  Medication Screening Complete  Transportation Screening Complete  Some recent data might be hidden

## 2021-01-31 NOTE — ED Triage Notes (Signed)
Patient presents to ED via [facility] for a fall.  States that the patient was using her walker and turned around too fast and lost her balance and fell.  Patient is complaining of left hip pain and back pain.  Denies loss of consciousness.  BP: 130/80 95% on RA HR: 82 CBG:165

## 2021-01-31 NOTE — H&P (Signed)
History and Physical    [name] [id number] DOB: 11-Nov-1922 DOA: 01/31/2021  Referring MD/NP/PA: Franchot Heidelberg, PA-C PCP: Myrlene Broker, MD  Patient coming from: Home via EMS  Chief Complaint: Right butt and leg pain  I have personally briefly reviewed patient's old medical records in [place]   HPI: [name] is a 87 y.o. female with medical history significant of HTN, HLD, DM type II, osteoporosis, anxiety, depression, hypothyroidism no longer on medication presents after having a fall at home with complaints of pain on the right buttock and leg.  Normally patient gets around with the use of a rolling walker and has been trying to open the front door when she fell.  She reported that the door was sticking him when she turned around to get back to her walker lost her balance landing on her right side.  She bumped her head, but denies any loss of consciousness.  Thereafter patient reported severe pain in her right leg and also her arm.  Her last fall was back in 03/2020, where she suffered fractures T11 and T12.  Her last bowel movement was yesterday.  She is currently not on any blood thinners.  Daughter notes that she had recently been started on Paxil and mirtazapine for issues with depression.  ED Course: Upon admission into the emergency department patient was seen to be afebrile with blood pressure 151/99-180 7/74 and all other vital signs maintained.  ET imaging of the head and cervical spine showed no acute abnormalities.  X-rays revealed a highly communicated right femur intertrochanteric fracture.  Orthopedics was consulted.  Patient had received 500 mL of normal saline IV fluids and pain medication.  TRH called to admit.  Review of Systems  Constitutional: Negative for chills and fever.  HENT: Positive for hearing loss.   Eyes: Negative for photophobia and pain.  Respiratory: Negative for cough, shortness of breath and stridor.   Cardiovascular: Negative  for chest pain and leg swelling.  Gastrointestinal: Negative for abdominal pain, constipation, nausea and vomiting.  Genitourinary: Negative for dysuria and hematuria.  Musculoskeletal: Positive for falls, joint pain and myalgias.  Neurological: Negative for focal weakness and loss of consciousness.  Psychiatric/Behavioral: Negative for memory loss and substance abuse.    Past Medical History:  Diagnosis Date  . Anemia   . Anxiety   . Arthritis    "all over" (04/23/2017)  . Cataract 2015   bilateral cat. extr.  . Depression   . GERD (gastroesophageal reflux disease)   . Heart murmur   . High cholesterol   . History of pelvic fracture 03/2016  . Hypertension   . Macular degeneration of both eyes   . Migraine    "started when I was in the 9th grade; lasted a couple years; started back in my 88's; had one daily right before OR" (04/23/2017)  . Osteoporosis   . Type II diabetes mellitus ([place])    "took Metformin for awhile; made me sick; diabetes is diet controlled" (04/23/2017)    Past Surgical History:  Procedure Laterality Date  . ABDOMINAL HYSTERECTOMY    . APPENDECTOMY    . BREAST CYST EXCISION Bilateral    "all benign"  . CARPAL TUNNEL RELEASE Right   . CATARACT EXTRACTION W/ INTRAOCULAR LENS  IMPLANT, BILATERAL Bilateral   . DILATION AND CURETTAGE OF UTERUS    . JOINT REPLACEMENT    . KYPHOPLASTY    . LAPAROSCOPIC CHOLECYSTECTOMY    . TONSILLECTOMY AND ADENOIDECTOMY  as child  . TOTAL KNEE ARTHROPLASTY Right 04/22/2017  . TOTAL KNEE ARTHROPLASTY Right 04/22/2017   Procedure: TOTAL KNEE ARTHROPLASTY;  Surgeon: Vickey Huger, MD;  Location: [facility];  Service: Orthopedics;  Laterality: Right;     reports that she has never smoked. She has never used smokeless tobacco. She reports previous alcohol use. She reports that she does not use drugs.  Allergies  Allergen Reactions  . Sulfamethoxazole Diarrhea and Other (See Comments)    GI UPSET    Family History  Problem  Relation Age of Onset  . Colon cancer Father   . Diabetes Father   . Pancreatic cancer Father   . Colon cancer Sister     Prior to Admission medications   Medication Sig Start Date End Date Taking? Authorizing Provider  acetaminophen (TYLENOL) 500 MG tablet Take 2 tablets (1,000 mg total) by mouth in the morning, at noon, and at bedtime. 03/15/20 03/15/21  Geradine Girt, DO  atorvastatin (LIPITOR) 20 MG tablet Take 20 mg by mouth at bedtime. 01/23/17   [provider]  CALCIUM CITRATE PO Take 1 tablet by mouth daily.    [provider]  COD LIVER OIL PO Take 1 tablet by mouth daily.     [provider]  docusate sodium (COLACE) 100 MG capsule Take 1 capsule (100 mg total) by mouth 2 (two) times daily. 03/12/20   Antonieta Pert, MD  Ferrous Gluconate (IRON 27 PO) Take 1 tablet by mouth at bedtime.     [provider]  Glucosamine-Chondroitin (COSAMIN DS PO) Take 1 tablet by mouth 2 (two) times daily. 1000mg  daily    [provider]  levothyroxine (SYNTHROID) 25 MCG tablet Take 1 tablet (25 mcg total) by mouth daily at 6 (six) AM. 03/15/20   Kc, Maren Beach, MD  lidocaine (LIDODERM) 5 % Place 1 patch onto the skin daily. Remove & Discard patch within 12 hours or as directed by MD 03/15/20   Antonieta Pert, MD  lisinopril (ZESTRIL) 40 MG tablet Take 1 tablet (40 mg total) by mouth daily. 03/14/20   Antonieta Pert, MD  Magnesium 250 MG TABS Take 250 mg by mouth at bedtime.    [provider]  meloxicam (MOBIC) 7.5 MG tablet Take 7.5 mg by mouth daily.    [provider]  methocarbamol (ROBAXIN) 500 MG tablet Take 1 tablet (500 mg total) by mouth every 6 (six) hours as needed for muscle spasms. 03/15/20   Geradine Girt, DO  Multiple Vitamins-Minerals (ICAPS PO) Take 1 tablet by mouth 2 (two) times daily.    [provider]  omeprazole (PRILOSEC) 40 MG capsule Take 40 mg by mouth daily.  01/23/17   [provider]  ondansetron (ZOFRAN) 4 MG tablet  Take 4 mg by mouth every 8 (eight) hours as needed for nausea/vomiting. 03/02/20   [provider]  oxyCODONE (OXY IR/ROXICODONE) 5 MG immediate release tablet Take 1-2 tablets (5-10 mg total) by mouth every 6 (six) hours as needed for severe pain. 03/15/20   Geradine Girt, DO  PARoxetine (PAXIL) 10 MG tablet Take 10 mg by mouth at bedtime.     [provider]  Polyethyl Glycol-Propyl Glycol (SYSTANE) 0.4-0.3 % SOLN Place 1 drop into both eyes 2 (two) times daily as needed (for tired/dry eyes.).    [provider]  polyethylene glycol (MIRALAX / GLYCOLAX) 17 g packet Take 17 g by mouth daily as needed for moderate constipation. 03/12/20   Antonieta Pert,  MD  psyllium (METAMUCIL) 58.6 % packet Take 1 packet by mouth daily at 12 noon.    [provider]  senna (SENOKOT) 8.6 MG TABS tablet Take 1 tablet (8.6 mg total) by mouth 2 (two) times daily. 03/12/20   Antonieta Pert, MD    Physical Exam:  Constitutional: Elderly female currently in NAD, calm, comfortable Vitals:   01/31/21 0921 01/31/21 0923 01/31/21 0945 01/31/21 1113  BP:  (!) 187/74 (!) 151/99 (!) 158/67  Pulse:   77 81  Resp:  18 17 12   Temp:  98.1 F (36.7 C)    TempSrc:  Oral    SpO2: 98% 97% 97% 96%  Weight:      Height:       Eyes: PERRL, lids and conjunctivae normal ENMT: Mucous membranes are moist. Posterior pharynx clear of any exudate or lesions.hard of hearing. Neck: normal, supple, no masses, no thyromegaly Respiratory: clear to auscultation bilaterally, no wheezing, no crackles. Normal respiratory effort. No accessory muscle use.  Cardiovascular: Regular rate and rhythm, no murmurs / rubs / gallops. No extremity edema. 2+ pedal pulses. No carotid bruits.  Abdomen: no tenderness, no masses palpated. No hepatosplenomegaly. Bowel sounds positive.  Musculoskeletal: no clubbing / cyanosis.  Right leg externally rotated and mildly shortened. Skin: no rashes, lesions, ulcers. No  induration Neurologic: CN 2-12 grossly intact. Sensation intact, DTR normal. Strength 5/5 in all 4.  Psychiatric: Normal judgment and insight. Alert and oriented x 3. Normal mood.     Labs on Admission: I have personally reviewed following labs and imaging studies  CBC: Recent Labs  Lab 01/31/21 0942  WBC 8.3  HGB 12.1  HCT 39.0  MCV 104.3*  PLT 409   Basic Metabolic Panel: Recent Labs  Lab 01/31/21 0942  NA 139  K 4.2  CL 107  CO2 24  GLUCOSE 155*  BUN 27*  CREATININE 0.90  CALCIUM 9.2   GFR: Estimated Creatinine Clearance: 32.1 mL/min (by C-G formula based on SCr of 0.9 mg/dL). Liver Function Tests: Recent Labs  Lab 01/31/21 0942  AST 24  ALT 19  ALKPHOS 57  BILITOT 0.8  PROT 6.5  ALBUMIN 3.5   No results for input(s): LIPASE, AMYLASE in the last 168 hours. No results for input(s): AMMONIA in the last 168 hours. Coagulation Profile: No results for input(s): INR, PROTIME in the last 168 hours. Cardiac Enzymes: No results for input(s): CKTOTAL, CKMB, CKMBINDEX, TROPONINI in the last 168 hours. BNP (last 3 results) No results for input(s): PROBNP in the last 8760 hours. HbA1C: No results for input(s): HGBA1C in the last 72 hours. CBG: No results for input(s): GLUCAP in the last 168 hours. Lipid Profile: No results for input(s): CHOL, HDL, LDLCALC, TRIG, CHOLHDL, LDLDIRECT in the last 72 hours. Thyroid Function Tests: No results for input(s): TSH, T4TOTAL, FREET4, T3FREE, THYROIDAB in the last 72 hours. Anemia Panel: No results for input(s): VITAMINB12, FOLATE, FERRITIN, TIBC, IRON, RETICCTPCT in the last 72 hours. Urine analysis:    Component Value Date/Time   COLORURINE YELLOW 03/08/2020 [place] 03/08/2020 1355   LABSPEC 1.010 03/08/2020 1355   PHURINE 7.0 03/08/2020 1355   GLUCOSEU NEGATIVE 03/08/2020 1355   HGBUR NEGATIVE 03/08/2020 1355   BILIRUBINUR NEGATIVE 03/08/2020 1355   KETONESUR NEGATIVE 03/08/2020 1355   PROTEINUR  30 (A) 03/08/2020 1355   NITRITE NEGATIVE 03/08/2020 1355   LEUKOCYTESUR NEGATIVE 03/08/2020 1355   Sepsis Labs: No results found for this or any previous visit (from  the past 240 hour(s)).   Radiological Exams on Admission: DG Chest 1 View  Result Date: 01/31/2021 CLINICAL DATA:  Fall. EXAM: CHEST  1 VIEW COMPARISON:  None. FINDINGS: Similar cardiomediastinal silhouette. Aortic atherosclerosis. Both lungs are clear. No visible pleural effusions or pneumothorax on this single AP radiograph. No evidence of acute osseous abnormality. Polyarticular degenerative change. IMPRESSION: No evidence of acute cardiopulmonary disease. Electronically Signed   By: Margaretha Sheffield MD   On: 01/31/2021 10:45   DG Lumbar Spine Complete  Result Date: 01/31/2021 CLINICAL DATA:  87 year old female status post fall at home. Back pain. EXAM: LUMBAR SPINE - COMPLETE 4+ VIEW COMPARISON:  Lumbar spine and chest CT 03/08/2020. FINDINGS: Chronic T11 and T12 vertebral fractures with progressive kyphotic deformity since last year. Previously augmented T12 level as before. No associated spondylolisthesis is evident. L1 through L5 appear stable and intact. L1 benign vertebral body hemangioma better demonstrated by CT. Stable lumbar lordosis. Chronic lumbar disc and endplate degeneration maximal at L1-L2. Other visible thoracic levels appear grossly intact. Grossly stable visible pelvis. Stable cholecystectomy clips. Calcified aortic atherosclerosis. IMPRESSION: 1. No acute osseous abnormality identified in the lumbar spine. 2. Expected evolution of the T11 and T12 vertebral fractures since last year with associated progressive kyphosis there. 3. Chronic lumbar spine degeneration. Aortic Atherosclerosis (ICD10-I70.0). Electronically Signed   By: Genevie Ann M.D.   On: 01/31/2021 10:48   CT Head Wo Contrast  Result Date: 01/31/2021 CLINICAL DATA:  87 year old female status post fall at home. EXAM: CT HEAD WITHOUT CONTRAST CT CERVICAL  SPINE WITHOUT CONTRAST TECHNIQUE: Multidetector CT imaging of the head and cervical spine was performed following the standard protocol without intravenous contrast. Multiplanar CT image reconstructions of the cervical spine were also generated. COMPARISON:  Brain MRI 11/05/2005.  Head CT 02/29/2020. Thoracic spine MRI 03/10/2020. FINDINGS: CT HEAD FINDINGS Brain: Stable cerebral volume since last year. No midline shift, ventriculomegaly, mass effect, evidence of mass lesion, intracranial hemorrhage or evidence of cortically based acute infarction. Patchy and confluent bilateral white matter hypodensity appears stable. No cortical encephalomalacia identified. Vascular: Calcified atherosclerosis at the skull base. No suspicious intracranial vascular hyperdensity. Skull: No fracture identified. Sinuses/Orbits: Bilateral middle ear and mastoid opacification is new from last year. Negative visible nasopharynx. Paranasal sinuses remain well aerated. Other: No acute orbit or scalp soft tissue finding. CT CERVICAL SPINE FINDINGS Alignment: Preserved cervical lordosis. Cervicothoracic junction alignment is within normal limits. Bilateral posterior element alignment is within normal limits. Skull base and vertebrae: Osteopenia. Visualized skull base is intact. No atlanto-occipital dissociation. C1 and C2 appear intact and aligned. No acute osseous abnormality identified. Multilevel degenerative cervical spine ankylosis, including C7-T1 bilateral facet ankylosis. Soft tissues and spinal canal: No prevertebral fluid or swelling. No visible canal hematoma. Disc levels: Bulky degenerative partially calcified ligamentous hypertrophy about the odontoid. Degenerative subchondral cyst at the right base of the odontoid. Degenerative appearing ankylosis of C2 through C5. Advanced disc degeneration with vacuum dist at the unfused C6-C7 level. C7-T1 facet and interbody ankylosis. Upper chest: Visible upper thoracic levels appear stable  and grossly intact. Stable right upper lobe lung nodule on series 9, image 88, benign. Calcified aortic atherosclerosis. IMPRESSION: 1. No acute traumatic injury identified in the head or cervical spine. 2. Stable non contrast CT appearance of the brain since last year. 3. New bilateral middle ear and mastoid opacification with no associated skull base fracture identified. Query bilateral otitis media. 4. Multilevel cervical spine degeneration and ankylosis. 5. Aortic Atherosclerosis (ICD10-I70.0). Electronically  Signed   By: Genevie Ann M.D.   On: 01/31/2021 10:57   CT Cervical Spine Wo Contrast  Result Date: 01/31/2021 CLINICAL DATA:  87 year old female status post fall at home. EXAM: CT HEAD WITHOUT CONTRAST CT CERVICAL SPINE WITHOUT CONTRAST TECHNIQUE: Multidetector CT imaging of the head and cervical spine was performed following the standard protocol without intravenous contrast. Multiplanar CT image reconstructions of the cervical spine were also generated. COMPARISON:  Brain MRI 11/05/2005.  Head CT 02/29/2020. Thoracic spine MRI 03/10/2020. FINDINGS: CT HEAD FINDINGS Brain: Stable cerebral volume since last year. No midline shift, ventriculomegaly, mass effect, evidence of mass lesion, intracranial hemorrhage or evidence of cortically based acute infarction. Patchy and confluent bilateral white matter hypodensity appears stable. No cortical encephalomalacia identified. Vascular: Calcified atherosclerosis at the skull base. No suspicious intracranial vascular hyperdensity. Skull: No fracture identified. Sinuses/Orbits: Bilateral middle ear and mastoid opacification is new from last year. Negative visible nasopharynx. Paranasal sinuses remain well aerated. Other: No acute orbit or scalp soft tissue finding. CT CERVICAL SPINE FINDINGS Alignment: Preserved cervical lordosis. Cervicothoracic junction alignment is within normal limits. Bilateral posterior element alignment is within normal limits. Skull base and  vertebrae: Osteopenia. Visualized skull base is intact. No atlanto-occipital dissociation. C1 and C2 appear intact and aligned. No acute osseous abnormality identified. Multilevel degenerative cervical spine ankylosis, including C7-T1 bilateral facet ankylosis. Soft tissues and spinal canal: No prevertebral fluid or swelling. No visible canal hematoma. Disc levels: Bulky degenerative partially calcified ligamentous hypertrophy about the odontoid. Degenerative subchondral cyst at the right base of the odontoid. Degenerative appearing ankylosis of C2 through C5. Advanced disc degeneration with vacuum dist at the unfused C6-C7 level. C7-T1 facet and interbody ankylosis. Upper chest: Visible upper thoracic levels appear stable and grossly intact. Stable right upper lobe lung nodule on series 9, image 88, benign. Calcified aortic atherosclerosis. IMPRESSION: 1. No acute traumatic injury identified in the head or cervical spine. 2. Stable non contrast CT appearance of the brain since last year. 3. New bilateral middle ear and mastoid opacification with no associated skull base fracture identified. Query bilateral otitis media. 4. Multilevel cervical spine degeneration and ankylosis. 5. Aortic Atherosclerosis (ICD10-I70.0). Electronically Signed   By: Genevie Ann M.D.   On: 01/31/2021 10:57   DG Hip Unilat W or Wo Pelvis 2-3 Views Right  Result Date: 01/31/2021 CLINICAL DATA:  87 year old female status post fall at home.  Pain. EXAM: DG HIP (WITH OR WITHOUT PELVIS) 2-3V RIGHT COMPARISON:  Hip series 02/29/2020. FINDINGS: Highly comminuted right femur intertrochanteric fracture with varus impaction. The right femoral head remains normally located. Extensive chronic bilateral pubic symphysis fractures appear stable. Grossly intact proximal left femur. No other acute osseous abnormality identified about the pelvis. Negative visible bowel gas pattern. IMPRESSION: 1. Highly comminuted right femur intertrochanteric fracture with  varus impaction. 2. Chronic bilateral pubic symphysis fractures. Electronically Signed   By: Genevie Ann M.D.   On: 01/31/2021 10:44    EKG: Independently reviewed.  Sinus rhythm 82 bpm with left atrial abnormality  Assessment/Plan Closed communicated intertrochanteric fracture of the proximal end of the right femur secondary to fall at home: Acute.  Patient presents after losing her balance falling onto her right side.  X-rays revealed a highly communicated right femur intertrochanteric fracture normally patient has been getting around with use of a rolling walker.  Geriatric perioperative risk assessment score for probability of perioperative myocardial infarction or cardiac arrest 0.6%. -Admit to a medical telemetry bed -Hip fracture order set  utilized -Hydrocodone/morphine as needed for moderate to severe pain -N.p.o. after midnight -Appreciate orthopedic consultative services, we will follow-up further recommendations  Essential hypertension: On admission blood pressure elevated up to 187/74.  Home medications include lisinopril 40 mg daily. -Continue lisinopril 40 mg daily -Hydralazine IV as needed for elevated blood pressures  History of hypothyroidism: Patient previously used to be on levothyroxine 25 mcg daily, but has not taken this in quite some time. -Check TSH  Diabetes mellitus type 2: Patient reports being diet controlled and not on any medication for treatment at this time. -Continue to monitor and consider starting sliding scale insulin if blood sugars remain greater than 100 and  Hyperlipidemia -Continue atorvastatin  Depression and anxiety -Continue Paxil and mirtazapine  Osteoporosis: Patient with prior history of T12 and T11 fractures back in 03/2020 after having a fall.  GERD -Pharmacy substitution of Protonix  DNR: Present on admission  DVT prophylaxis: Lovenox Code Status: DNR Family Communication: Daughter updated at bedside Disposition Plan: To be  determined Consults called: Orthopedics Admission status: Inpatient, require more than 2 midnight stay for need of surgical procedure  Norval Morton MD [facility]   If 7PM-7AM, please contact night-coverage   01/31/2021, 12:26 PM

## 2021-01-31 NOTE — ED Provider Notes (Signed)
[facility] EMERGENCY DEPARTMENT Provider Note   CSN: [id number] Arrival date & time: 01/31/21  [id number]     History Chief Complaint  Patient presents with  . Fall    [name] is a 87 y.o. female presenting for evaluation after fall.  Patient states she was trying to open the door, so her walker was behind her, and when she turned to her walker she fell, landing on her back.  She landed on the floor.  She reports since then, she has had severe pain in her entire right leg up to her hip.  It is constant.  She is unable to stand or ambulate since.  She also reports pain in her low back.  She did hit her head, but did not lose consciousness.  Denies headache, neck pain, chest pain, abdominal pain.  She is not on blood thinners.  She is not sure exactly which medication she takes, and does not know if she has had them today.  Additional history taken chart reviewed.  Patient with history of anemia, anxiety, depression, GERD, hypertension, macular degeneration, diabetes which is diet controlled  HPI     Past Medical History:  Diagnosis Date  . Anemia   . Anxiety   . Arthritis    "all over" (04/23/2017)  . Cataract 2015   bilateral cat. extr.  . Depression   . GERD (gastroesophageal reflux disease)   . Heart murmur   . High cholesterol   . History of pelvic fracture 03/2016  . Hypertension   . Macular degeneration of both eyes   . Migraine    "started when I was in the 9th grade; lasted a couple years; started back in my 68's; had one daily right before OR" (04/23/2017)  . Osteoporosis   . Type II diabetes mellitus ([place])    "took Metformin for awhile; made me sick; diabetes is diet controlled" (04/23/2017)    Patient Active Problem List   Diagnosis Date Noted  . Back pain 03/09/2020  . Closed T12 fracture ([place]) 03/08/2020  . Type II diabetes mellitus ([place])   . Depression   . Hyperlipidemia associated with type 2 diabetes mellitus ([place])   .  Hypothyroidism   . Hypertension associated with diabetes ([place])   . Screening for viral disease 08/13/2019  . Heme positive stool 08/13/2019  . Anemia 08/13/2019  . Melena 08/13/2019  . S/P total knee replacement 04/22/2017    Past Surgical History:  Procedure Laterality Date  . ABDOMINAL HYSTERECTOMY    . APPENDECTOMY    . BREAST CYST EXCISION Bilateral    "all benign"  . CARPAL TUNNEL RELEASE Right   . CATARACT EXTRACTION W/ INTRAOCULAR LENS  IMPLANT, BILATERAL Bilateral   . DILATION AND CURETTAGE OF UTERUS    . JOINT REPLACEMENT    . KYPHOPLASTY    . LAPAROSCOPIC CHOLECYSTECTOMY    . TONSILLECTOMY AND ADENOIDECTOMY     as child  . TOTAL KNEE ARTHROPLASTY Right 04/22/2017  . TOTAL KNEE ARTHROPLASTY Right 04/22/2017   Procedure: TOTAL KNEE ARTHROPLASTY;  Surgeon: Vickey Huger, MD;  Location: [facility];  Service: Orthopedics;  Laterality: Right;     OB History   No obstetric history on file.     Family History  Problem Relation Age of Onset  . Colon cancer Father   . Diabetes Father   . Pancreatic cancer Father   . Colon cancer Sister     Social History   Tobacco Use  . Smoking  status: Never Smoker  . Smokeless tobacco: Never Used  Vaping Use  . Vaping Use: Never used  Substance Use Topics  . Alcohol use: Not Currently    Comment: 04/23/2017 "glass of wine q couple months; if that"  . Drug use: No    Home Medications Prior to Admission medications   Medication Sig Start Date End Date Taking? Authorizing Provider  acetaminophen (TYLENOL) 500 MG tablet Take 2 tablets (1,000 mg total) by mouth in the morning, at noon, and at bedtime. 03/15/20 03/15/21  Geradine Girt, DO  atorvastatin (LIPITOR) 20 MG tablet Take 20 mg by mouth at bedtime. 01/23/17   [provider]  CALCIUM CITRATE PO Take 1 tablet by mouth daily.    [provider]  COD LIVER OIL PO Take 1 tablet by mouth daily.     [provider]  docusate sodium (COLACE) 100 MG capsule  Take 1 capsule (100 mg total) by mouth 2 (two) times daily. 03/12/20   Antonieta Pert, MD  Ferrous Gluconate (IRON 27 PO) Take 1 tablet by mouth at bedtime.     [provider]  Glucosamine-Chondroitin (COSAMIN DS PO) Take 1 tablet by mouth 2 (two) times daily. 1000mg  daily    [provider]  levothyroxine (SYNTHROID) 25 MCG tablet Take 1 tablet (25 mcg total) by mouth daily at 6 (six) AM. 03/15/20   Kc, Maren Beach, MD  lidocaine (LIDODERM) 5 % Place 1 patch onto the skin daily. Remove & Discard patch within 12 hours or as directed by MD 03/15/20   Antonieta Pert, MD  lisinopril (ZESTRIL) 40 MG tablet Take 1 tablet (40 mg total) by mouth daily. 03/14/20   Antonieta Pert, MD  Magnesium 250 MG TABS Take 250 mg by mouth at bedtime.    [provider]  meloxicam (MOBIC) 7.5 MG tablet Take 7.5 mg by mouth daily.    [provider]  methocarbamol (ROBAXIN) 500 MG tablet Take 1 tablet (500 mg total) by mouth every 6 (six) hours as needed for muscle spasms. 03/15/20   Geradine Girt, DO  Multiple Vitamins-Minerals (ICAPS PO) Take 1 tablet by mouth 2 (two) times daily.    [provider]  omeprazole (PRILOSEC) 40 MG capsule Take 40 mg by mouth daily.  01/23/17   [provider]  ondansetron (ZOFRAN) 4 MG tablet Take 4 mg by mouth every 8 (eight) hours as needed for nausea/vomiting. 03/02/20   [provider]  oxyCODONE (OXY IR/ROXICODONE) 5 MG immediate release tablet Take 1-2 tablets (5-10 mg total) by mouth every 6 (six) hours as needed for severe pain. 03/15/20   Geradine Girt, DO  PARoxetine (PAXIL) 10 MG tablet Take 10 mg by mouth at bedtime.     [provider]  Polyethyl Glycol-Propyl Glycol (SYSTANE) 0.4-0.3 % SOLN Place 1 drop into both eyes 2 (two) times daily as needed (for tired/dry eyes.).    [provider]  polyethylene glycol (MIRALAX / GLYCOLAX) 17 g packet Take 17 g by mouth daily as needed for moderate constipation. 03/12/20   Antonieta Pert,  MD  psyllium (METAMUCIL) 58.6 % packet Take 1 packet by mouth daily at 12 noon.    [provider]  senna (SENOKOT) 8.6 MG TABS tablet Take 1 tablet (8.6 mg total) by mouth 2 (two) times daily. 03/12/20   Antonieta Pert, MD    Allergies    Sulfamethoxazole  Review of Systems   Review of Systems  Musculoskeletal: Positive for arthralgias and  back pain.  All other systems reviewed and are negative.   Physical Exam Updated Vital Signs BP (!) 151/99   Pulse 77   Temp 98.1 F (36.7 C) (Oral)   Resp 17   Ht 5\' 5"  (1.651 m)   Wt 54.4 kg   SpO2 97%   BMI 19.97 kg/m   Physical Exam Vitals and nursing note reviewed.  Constitutional:      General: She is not in acute distress.    Appearance: She is well-developed.     Comments: Appears uncomfortable due to pain, otherwise nontoxic  HENT:     Head: Normocephalic and atraumatic.     Comments: No signs of head trauma Eyes:     Extraocular Movements: Extraocular movements intact.     Conjunctiva/sclera: Conjunctivae normal.     Comments: Right pupil 4 mm, left pupil 2 mm  Neck:     Comments: Tenderness palpation midline C-spine.  No step-offs or deformities. Cardiovascular:     Rate and Rhythm: Normal rate and regular rhythm.     Pulses: Normal pulses.  Pulmonary:     Effort: Pulmonary effort is normal. No respiratory distress.     Breath sounds: Normal breath sounds. No wheezing.  Abdominal:     General: There is no distension.     Palpations: Abdomen is soft. There is no mass.     Tenderness: There is no abdominal tenderness. There is no guarding or rebound.  Musculoskeletal:        General: Tenderness and deformity present.     Cervical back: Normal range of motion and neck supple. Tenderness present.     Comments: Swelling and tenderness of the right hip/proximal leg.  Right leg is shortened and externally rotated.  Pedal pulses 2+ bilaterally.  No tenderness palpation elsewhere in the right leg.  No tenderness  palpation of the left hip.  Skin:    General: Skin is warm and dry.     Capillary Refill: Capillary refill takes less than 2 seconds.  Neurological:     Mental Status: She is alert and oriented to person, place, and time.     ED Results / Procedures / Treatments   Labs (all labs ordered are listed, but only abnormal results are displayed) Labs Reviewed  CBC - Abnormal; Notable for the following components:      Result Value   RBC 3.74 (*)    MCV 104.3 (*)    All other components within normal limits  RESP PANEL BY RT-PCR (FLU A&B, COVID) ARPGX2  COMPREHENSIVE METABOLIC PANEL    EKG None  Radiology DG Chest 1 View  Result Date: 01/31/2021 CLINICAL DATA:  Fall. EXAM: CHEST  1 VIEW COMPARISON:  None. FINDINGS: Similar cardiomediastinal silhouette. Aortic atherosclerosis. Both lungs are clear. No visible pleural effusions or pneumothorax on this single AP radiograph. No evidence of acute osseous abnormality. Polyarticular degenerative change. IMPRESSION: No evidence of acute cardiopulmonary disease. Electronically Signed   By: Margaretha Sheffield MD   On: 01/31/2021 10:45   DG Lumbar Spine Complete  Result Date: 01/31/2021 CLINICAL DATA:  87 year old female status post fall at home. Back pain. EXAM: LUMBAR SPINE - COMPLETE 4+ VIEW COMPARISON:  Lumbar spine and chest CT 03/08/2020. FINDINGS: Chronic T11 and T12 vertebral fractures with progressive kyphotic deformity since last year. Previously augmented T12 level as before. No associated spondylolisthesis is evident. L1 through L5 appear stable and intact. L1 benign vertebral body hemangioma better demonstrated by CT. Stable lumbar lordosis. Chronic lumbar  disc and endplate degeneration maximal at L1-L2. Other visible thoracic levels appear grossly intact. Grossly stable visible pelvis. Stable cholecystectomy clips. Calcified aortic atherosclerosis. IMPRESSION: 1. No acute osseous abnormality identified in the lumbar spine. 2. Expected  evolution of the T11 and T12 vertebral fractures since last year with associated progressive kyphosis there. 3. Chronic lumbar spine degeneration. Aortic Atherosclerosis (ICD10-I70.0). Electronically Signed   By: Genevie Ann M.D.   On: 01/31/2021 10:48   CT Head Wo Contrast  Result Date: 01/31/2021 CLINICAL DATA:  87 year old female status post fall at home. EXAM: CT HEAD WITHOUT CONTRAST CT CERVICAL SPINE WITHOUT CONTRAST TECHNIQUE: Multidetector CT imaging of the head and cervical spine was performed following the standard protocol without intravenous contrast. Multiplanar CT image reconstructions of the cervical spine were also generated. COMPARISON:  Brain MRI 11/05/2005.  Head CT 02/29/2020. Thoracic spine MRI 03/10/2020. FINDINGS: CT HEAD FINDINGS Brain: Stable cerebral volume since last year. No midline shift, ventriculomegaly, mass effect, evidence of mass lesion, intracranial hemorrhage or evidence of cortically based acute infarction. Patchy and confluent bilateral white matter hypodensity appears stable. No cortical encephalomalacia identified. Vascular: Calcified atherosclerosis at the skull base. No suspicious intracranial vascular hyperdensity. Skull: No fracture identified. Sinuses/Orbits: Bilateral middle ear and mastoid opacification is new from last year. Negative visible nasopharynx. Paranasal sinuses remain well aerated. Other: No acute orbit or scalp soft tissue finding. CT CERVICAL SPINE FINDINGS Alignment: Preserved cervical lordosis. Cervicothoracic junction alignment is within normal limits. Bilateral posterior element alignment is within normal limits. Skull base and vertebrae: Osteopenia. Visualized skull base is intact. No atlanto-occipital dissociation. C1 and C2 appear intact and aligned. No acute osseous abnormality identified. Multilevel degenerative cervical spine ankylosis, including C7-T1 bilateral facet ankylosis. Soft tissues and spinal canal: No prevertebral fluid or swelling.  No visible canal hematoma. Disc levels: Bulky degenerative partially calcified ligamentous hypertrophy about the odontoid. Degenerative subchondral cyst at the right base of the odontoid. Degenerative appearing ankylosis of C2 through C5. Advanced disc degeneration with vacuum dist at the unfused C6-C7 level. C7-T1 facet and interbody ankylosis. Upper chest: Visible upper thoracic levels appear stable and grossly intact. Stable right upper lobe lung nodule on series 9, image 88, benign. Calcified aortic atherosclerosis. IMPRESSION: 1. No acute traumatic injury identified in the head or cervical spine. 2. Stable non contrast CT appearance of the brain since last year. 3. New bilateral middle ear and mastoid opacification with no associated skull base fracture identified. Query bilateral otitis media. 4. Multilevel cervical spine degeneration and ankylosis. 5. Aortic Atherosclerosis (ICD10-I70.0). Electronically Signed   By: Genevie Ann M.D.   On: 01/31/2021 10:57   CT Cervical Spine Wo Contrast  Result Date: 01/31/2021 CLINICAL DATA:  87 year old female status post fall at home. EXAM: CT HEAD WITHOUT CONTRAST CT CERVICAL SPINE WITHOUT CONTRAST TECHNIQUE: Multidetector CT imaging of the head and cervical spine was performed following the standard protocol without intravenous contrast. Multiplanar CT image reconstructions of the cervical spine were also generated. COMPARISON:  Brain MRI 11/05/2005.  Head CT 02/29/2020. Thoracic spine MRI 03/10/2020. FINDINGS: CT HEAD FINDINGS Brain: Stable cerebral volume since last year. No midline shift, ventriculomegaly, mass effect, evidence of mass lesion, intracranial hemorrhage or evidence of cortically based acute infarction. Patchy and confluent bilateral white matter hypodensity appears stable. No cortical encephalomalacia identified. Vascular: Calcified atherosclerosis at the skull base. No suspicious intracranial vascular hyperdensity. Skull: No fracture identified.  Sinuses/Orbits: Bilateral middle ear and mastoid opacification is new from last year. Negative visible nasopharynx. Paranasal sinuses  remain well aerated. Other: No acute orbit or scalp soft tissue finding. CT CERVICAL SPINE FINDINGS Alignment: Preserved cervical lordosis. Cervicothoracic junction alignment is within normal limits. Bilateral posterior element alignment is within normal limits. Skull base and vertebrae: Osteopenia. Visualized skull base is intact. No atlanto-occipital dissociation. C1 and C2 appear intact and aligned. No acute osseous abnormality identified. Multilevel degenerative cervical spine ankylosis, including C7-T1 bilateral facet ankylosis. Soft tissues and spinal canal: No prevertebral fluid or swelling. No visible canal hematoma. Disc levels: Bulky degenerative partially calcified ligamentous hypertrophy about the odontoid. Degenerative subchondral cyst at the right base of the odontoid. Degenerative appearing ankylosis of C2 through C5. Advanced disc degeneration with vacuum dist at the unfused C6-C7 level. C7-T1 facet and interbody ankylosis. Upper chest: Visible upper thoracic levels appear stable and grossly intact. Stable right upper lobe lung nodule on series 9, image 88, benign. Calcified aortic atherosclerosis. IMPRESSION: 1. No acute traumatic injury identified in the head or cervical spine. 2. Stable non contrast CT appearance of the brain since last year. 3. New bilateral middle ear and mastoid opacification with no associated skull base fracture identified. Query bilateral otitis media. 4. Multilevel cervical spine degeneration and ankylosis. 5. Aortic Atherosclerosis (ICD10-I70.0). Electronically Signed   By: Genevie Ann M.D.   On: 01/31/2021 10:57   DG Hip Unilat W or Wo Pelvis 2-3 Views Right  Result Date: 01/31/2021 CLINICAL DATA:  87 year old female status post fall at home.  Pain. EXAM: DG HIP (WITH OR WITHOUT PELVIS) 2-3V RIGHT COMPARISON:  Hip series 02/29/2020.  FINDINGS: Highly comminuted right femur intertrochanteric fracture with varus impaction. The right femoral head remains normally located. Extensive chronic bilateral pubic symphysis fractures appear stable. Grossly intact proximal left femur. No other acute osseous abnormality identified about the pelvis. Negative visible bowel gas pattern. IMPRESSION: 1. Highly comminuted right femur intertrochanteric fracture with varus impaction. 2. Chronic bilateral pubic symphysis fractures. Electronically Signed   By: Genevie Ann M.D.   On: 01/31/2021 10:44    Procedures Procedures   Medications Ordered in ED Medications  fentaNYL (SUBLIMAZE) injection 50 mcg (50 mcg Intravenous Given 01/31/21 0950)  HYDROmorphone (DILAUDID) injection 0.5 mg (0.5 mg Intravenous Given 01/31/21 1109)  sodium chloride 0.9 % bolus 500 mL (500 mLs Intravenous New Bag/Given 01/31/21 1109)    ED Course  I have reviewed the triage vital signs and the nursing notes.  Pertinent labs & imaging results that were available during my care of the patient were reviewed by me and considered in my medical decision making (see chart for details).    MDM Rules/Calculators/A&P                          Patient presented for evaluation after fall.  On exam, patient peers uncomfortable due to pain, otherwise nontoxic.  Pain is concerning for right leg being shortened and externally rotated, concern for hip fracture.  Additionally, patient has unequal pupils, however she does have a history macular degeneration and uses eyedrops, this is more likely the cause and a head injury in the setting of the patient is neurovascularly intact without headache.  However considering that she hit her head, will obtain CT head and neck for further evaluation as well as x-rays of the hip and pelvis.  X-rays viewed and independently interpreted by me, shows right intertrochanteric hip fracture.  Will consult with orthopedics.  Otherwise no acute bony fractures.  Per  radiology, chronic pubic symphysis fractures and improvement  of previous T11 and T12 fractures since last year.  CT head and neck negative for acute findings other than opacification most consistent with fluid in the ears.  No acute fracture.  Discussed with Jacqulynn Cadet, PA-C from orthopedics, who will evaluate patient.  Discussed findings with patient and daughter, who are aware of the diagnosis and the plan.  Patient will need to be admitted to medicine.  Discussed with Dr. Tamala Julian from [facility] hospitalist service, patient to be admitted.  Final Clinical Impression(s) / ED Diagnoses Final diagnoses:  Closed fracture of right hip, initial encounter [facility])  Fall, initial encounter    Rx / [place] Orders ED Discharge Orders    None       Franchot Heidelberg, PA-C 01/31/21 [place], [place], MD 02/01/21 [phone]

## 2021-01-31 NOTE — H&P (View-Only) (Signed)
Reason for Consult:Right hip fx Referring Physician: [place] Time called: [id number] Time at bedside: [place] is an 87 y.o. female.  HPI: [name] was at home and trying to get through a door when she lost her balance and fell onto her RW. She had immediate right hip pain and could not get up. She was brought to the ED where x-rays showed a hip fx and orthopedic surgery was consulted. She lives at home with assistance.  Past Medical History:  Diagnosis Date  . Anemia   . Anxiety   . Arthritis    "all over" (04/23/2017)  . Cataract 2015   bilateral cat. extr.  . Depression   . GERD (gastroesophageal reflux disease)   . Heart murmur   . High cholesterol   . History of pelvic fracture 03/2016  . Hypertension   . Macular degeneration of both eyes   . Migraine    "started when I was in the 9th grade; lasted a couple years; started back in my 54's; had one daily right before OR" (04/23/2017)  . Osteoporosis   . Type II diabetes mellitus ([place])    "took Metformin for awhile; made me sick; diabetes is diet controlled" (04/23/2017)    Past Surgical History:  Procedure Laterality Date  . ABDOMINAL HYSTERECTOMY    . APPENDECTOMY    . BREAST CYST EXCISION Bilateral    "all benign"  . CARPAL TUNNEL RELEASE Right   . CATARACT EXTRACTION W/ INTRAOCULAR LENS  IMPLANT, BILATERAL Bilateral   . DILATION AND CURETTAGE OF UTERUS    . JOINT REPLACEMENT    . KYPHOPLASTY    . LAPAROSCOPIC CHOLECYSTECTOMY    . TONSILLECTOMY AND ADENOIDECTOMY     as child  . TOTAL KNEE ARTHROPLASTY Right 04/22/2017  . TOTAL KNEE ARTHROPLASTY Right 04/22/2017   Procedure: TOTAL KNEE ARTHROPLASTY;  Surgeon: Vickey Huger, MD;  Location: [facility];  Service: Orthopedics;  Laterality: Right;    Family History  Problem Relation Age of Onset  . Colon cancer Father   . Diabetes Father   . Pancreatic cancer Father   . Colon cancer Sister     Social History:  reports that she has never smoked. She has never used  smokeless tobacco. She reports previous alcohol use. She reports that she does not use drugs.  Allergies:  Allergies  Allergen Reactions  . Sulfamethoxazole Diarrhea and Other (See Comments)    GI UPSET    Medications: I have reviewed the patient's current medications.  Results for orders placed or performed during the hospital encounter of 01/31/21 (from the past 48 hour(s))  CBC     Status: Abnormal   Collection Time: 01/31/21  9:42 AM  Result Value Ref Range   WBC 8.3 4.0 - 10.5 K/uL   RBC 3.74 (L) 3.87 - 5.11 MIL/uL   Hemoglobin 12.1 12.0 - 15.0 g/dL   HCT 39.0 36.0 - 46.0 %   MCV 104.3 (H) 80.0 - 100.0 fL   MCH 32.4 26.0 - 34.0 pg   MCHC 31.0 30.0 - 36.0 g/dL   RDW 13.4 11.5 - 15.5 %   Platelets 286 150 - 400 K/uL   nRBC 0.0 0.0 - 0.2 %    Comment: Performed at [facility], [place] [address] [place], [place] [address]  Comprehensive metabolic panel     Status: Abnormal   Collection Time: 01/31/21  9:42 AM  Result Value Ref Range   Sodium 139 135 - 145  mmol/L   Potassium 4.2 3.5 - 5.1 mmol/L   Chloride 107 98 - 111 mmol/L   CO2 24 22 - 32 mmol/L   Glucose, Bld 155 (H) 70 - 99 mg/dL    Comment: Glucose reference range applies only to samples taken after fasting for at least 8 hours.   BUN 27 (H) 8 - 23 mg/dL   Creatinine, Ser 0.90 0.44 - 1.00 mg/dL   Calcium 9.2 8.9 - 10.3 mg/dL   Total Protein 6.5 6.5 - 8.1 g/dL   Albumin 3.5 3.5 - 5.0 g/dL   AST 24 15 - 41 U/L   ALT 19 0 - 44 U/L   Alkaline Phosphatase 57 38 - 126 U/L   Total Bilirubin 0.8 0.3 - 1.2 mg/dL   GFR, Estimated 59 (L) >60 mL/min    Comment: (NOTE) Calculated using the CKD-EPI Creatinine Equation (2021)    Anion gap 8 5 - 15    Comment: Performed at [facility] [address] [place], [place] [address]    DG Chest 1 View  Result Date: 01/31/2021 CLINICAL DATA:  Fall. EXAM: CHEST  1 VIEW COMPARISON:  None. FINDINGS: Similar cardiomediastinal silhouette. Aortic atherosclerosis. Both  lungs are clear. No visible pleural effusions or pneumothorax on this single AP radiograph. No evidence of acute osseous abnormality. Polyarticular degenerative change. IMPRESSION: No evidence of acute cardiopulmonary disease. Electronically Signed   By: Margaretha Sheffield MD   On: 01/31/2021 10:45   DG Lumbar Spine Complete  Result Date: 01/31/2021 CLINICAL DATA:  87 year old female status post fall at home. Back pain. EXAM: LUMBAR SPINE - COMPLETE 4+ VIEW COMPARISON:  Lumbar spine and chest CT 03/08/2020. FINDINGS: Chronic T11 and T12 vertebral fractures with progressive kyphotic deformity since last year. Previously augmented T12 level as before. No associated spondylolisthesis is evident. L1 through L5 appear stable and intact. L1 benign vertebral body hemangioma better demonstrated by CT. Stable lumbar lordosis. Chronic lumbar disc and endplate degeneration maximal at L1-L2. Other visible thoracic levels appear grossly intact. Grossly stable visible pelvis. Stable cholecystectomy clips. Calcified aortic atherosclerosis. IMPRESSION: 1. No acute osseous abnormality identified in the lumbar spine. 2. Expected evolution of the T11 and T12 vertebral fractures since last year with associated progressive kyphosis there. 3. Chronic lumbar spine degeneration. Aortic Atherosclerosis (ICD10-I70.0). Electronically Signed   By: Genevie Ann M.D.   On: 01/31/2021 10:48   CT Head Wo Contrast  Result Date: 01/31/2021 CLINICAL DATA:  87 year old female status post fall at home. EXAM: CT HEAD WITHOUT CONTRAST CT CERVICAL SPINE WITHOUT CONTRAST TECHNIQUE: Multidetector CT imaging of the head and cervical spine was performed following the standard protocol without intravenous contrast. Multiplanar CT image reconstructions of the cervical spine were also generated. COMPARISON:  Brain MRI 11/05/2005.  Head CT 02/29/2020. Thoracic spine MRI 03/10/2020. FINDINGS: CT HEAD FINDINGS Brain: Stable cerebral volume since last year. No  midline shift, ventriculomegaly, mass effect, evidence of mass lesion, intracranial hemorrhage or evidence of cortically based acute infarction. Patchy and confluent bilateral white matter hypodensity appears stable. No cortical encephalomalacia identified. Vascular: Calcified atherosclerosis at the skull base. No suspicious intracranial vascular hyperdensity. Skull: No fracture identified. Sinuses/Orbits: Bilateral middle ear and mastoid opacification is new from last year. Negative visible nasopharynx. Paranasal sinuses remain well aerated. Other: No acute orbit or scalp soft tissue finding. CT CERVICAL SPINE FINDINGS Alignment: Preserved cervical lordosis. Cervicothoracic junction alignment is within normal limits. Bilateral posterior element alignment is within normal limits. Skull base and vertebrae: Osteopenia.  Visualized skull base is intact. No atlanto-occipital dissociation. C1 and C2 appear intact and aligned. No acute osseous abnormality identified. Multilevel degenerative cervical spine ankylosis, including C7-T1 bilateral facet ankylosis. Soft tissues and spinal canal: No prevertebral fluid or swelling. No visible canal hematoma. Disc levels: Bulky degenerative partially calcified ligamentous hypertrophy about the odontoid. Degenerative subchondral cyst at the right base of the odontoid. Degenerative appearing ankylosis of C2 through C5. Advanced disc degeneration with vacuum dist at the unfused C6-C7 level. C7-T1 facet and interbody ankylosis. Upper chest: Visible upper thoracic levels appear stable and grossly intact. Stable right upper lobe lung nodule on series 9, image 88, benign. Calcified aortic atherosclerosis. IMPRESSION: 1. No acute traumatic injury identified in the head or cervical spine. 2. Stable non contrast CT appearance of the brain since last year. 3. New bilateral middle ear and mastoid opacification with no associated skull base fracture identified. Query bilateral otitis media. 4.  Multilevel cervical spine degeneration and ankylosis. 5. Aortic Atherosclerosis (ICD10-I70.0). Electronically Signed   By: Genevie Ann M.D.   On: 01/31/2021 10:57   CT Cervical Spine Wo Contrast  Result Date: 01/31/2021 CLINICAL DATA:  87 year old female status post fall at home. EXAM: CT HEAD WITHOUT CONTRAST CT CERVICAL SPINE WITHOUT CONTRAST TECHNIQUE: Multidetector CT imaging of the head and cervical spine was performed following the standard protocol without intravenous contrast. Multiplanar CT image reconstructions of the cervical spine were also generated. COMPARISON:  Brain MRI 11/05/2005.  Head CT 02/29/2020. Thoracic spine MRI 03/10/2020. FINDINGS: CT HEAD FINDINGS Brain: Stable cerebral volume since last year. No midline shift, ventriculomegaly, mass effect, evidence of mass lesion, intracranial hemorrhage or evidence of cortically based acute infarction. Patchy and confluent bilateral white matter hypodensity appears stable. No cortical encephalomalacia identified. Vascular: Calcified atherosclerosis at the skull base. No suspicious intracranial vascular hyperdensity. Skull: No fracture identified. Sinuses/Orbits: Bilateral middle ear and mastoid opacification is new from last year. Negative visible nasopharynx. Paranasal sinuses remain well aerated. Other: No acute orbit or scalp soft tissue finding. CT CERVICAL SPINE FINDINGS Alignment: Preserved cervical lordosis. Cervicothoracic junction alignment is within normal limits. Bilateral posterior element alignment is within normal limits. Skull base and vertebrae: Osteopenia. Visualized skull base is intact. No atlanto-occipital dissociation. C1 and C2 appear intact and aligned. No acute osseous abnormality identified. Multilevel degenerative cervical spine ankylosis, including C7-T1 bilateral facet ankylosis. Soft tissues and spinal canal: No prevertebral fluid or swelling. No visible canal hematoma. Disc levels: Bulky degenerative partially calcified  ligamentous hypertrophy about the odontoid. Degenerative subchondral cyst at the right base of the odontoid. Degenerative appearing ankylosis of C2 through C5. Advanced disc degeneration with vacuum dist at the unfused C6-C7 level. C7-T1 facet and interbody ankylosis. Upper chest: Visible upper thoracic levels appear stable and grossly intact. Stable right upper lobe lung nodule on series 9, image 88, benign. Calcified aortic atherosclerosis. IMPRESSION: 1. No acute traumatic injury identified in the head or cervical spine. 2. Stable non contrast CT appearance of the brain since last year. 3. New bilateral middle ear and mastoid opacification with no associated skull base fracture identified. Query bilateral otitis media. 4. Multilevel cervical spine degeneration and ankylosis. 5. Aortic Atherosclerosis (ICD10-I70.0). Electronically Signed   By: Genevie Ann M.D.   On: 01/31/2021 10:57   DG Hip Unilat W or Wo Pelvis 2-3 Views Right  Result Date: 01/31/2021 CLINICAL DATA:  87 year old female status post fall at home.  Pain. EXAM: DG HIP (WITH OR WITHOUT PELVIS) 2-3V RIGHT COMPARISON:  Hip series 02/29/2020.  FINDINGS: Highly comminuted right femur intertrochanteric fracture with varus impaction. The right femoral head remains normally located. Extensive chronic bilateral pubic symphysis fractures appear stable. Grossly intact proximal left femur. No other acute osseous abnormality identified about the pelvis. Negative visible bowel gas pattern. IMPRESSION: 1. Highly comminuted right femur intertrochanteric fracture with varus impaction. 2. Chronic bilateral pubic symphysis fractures. Electronically Signed   By: Genevie Ann M.D.   On: 01/31/2021 10:44    Review of Systems  HENT: Negative for ear discharge, ear pain, hearing loss and tinnitus.   Eyes: Negative for photophobia and pain.  Respiratory: Negative for cough and shortness of breath.   Cardiovascular: Negative for chest pain.  Gastrointestinal: Negative for  abdominal pain, nausea and vomiting.  Genitourinary: Negative for dysuria, flank pain, frequency and urgency.  Musculoskeletal: Positive for arthralgias (Right hip). Negative for back pain, myalgias and neck pain.  Neurological: Negative for dizziness and headaches.  Hematological: Does not bruise/bleed easily.  Psychiatric/Behavioral: The patient is not nervous/anxious.    Blood pressure (!) 158/67, pulse 81, temperature 98.1 F (36.7 C), temperature source Oral, resp. rate 12, height 5\' 5"  (1.651 m), weight 54.4 kg, SpO2 96 %. Physical Exam Constitutional:      General: She is not in acute distress.    Appearance: She is well-developed. She is not diaphoretic.  HENT:     Head: Normocephalic and atraumatic.  Eyes:     General: No scleral icterus.       Right eye: No discharge.        Left eye: No discharge.     Conjunctiva/sclera: Conjunctivae normal.  Cardiovascular:     Rate and Rhythm: Normal rate and regular rhythm.  Pulmonary:     Effort: Pulmonary effort is normal. No respiratory distress.  Musculoskeletal:     Cervical back: Normal range of motion.     Comments: LLE No traumatic wounds, ecchymosis, or rash  Mod TTP hip  No knee or ankle effusion  Knee stable to varus/ valgus and anterior/posterior stress  Sens DPN, SPN, TN grossly intact  Motor EHL, ext, flex, evers grossly intact  DP 2+, PT 1+, No significant edema  Skin:    General: Skin is warm and dry.  Neurological:     Mental Status: She is alert.  Psychiatric:        Behavior: Behavior normal.     Assessment/Plan: Right hip fx -- Plan IMN tomorrow with Dr. Doreatha Martin. Please keep NPO after MN. Multiple medical problems including anemia, anxiety, depression, GERD, hypertension, macular degeneration, and diabetes -- per primary service    Lisette Abu, PA-C Orthopedic Surgery [phone] 01/31/2021, 12:14 PM

## 2021-02-01 ENCOUNTER — Inpatient Hospital Stay (HOSPITAL_COMMUNITY): Payer: Medicare Other

## 2021-02-01 ENCOUNTER — Encounter (HOSPITAL_COMMUNITY)
Admission: EM | Disposition: A | Discharge status: Skilled Nursing Facility | Payer: Self-pay | Source: Home / Self Care | Attending: Internal Medicine

## 2021-02-01 ENCOUNTER — Inpatient Hospital Stay (HOSPITAL_COMMUNITY): Payer: Medicare Other | Admitting: Anesthesiology

## 2021-02-01 ENCOUNTER — Encounter (HOSPITAL_COMMUNITY): Payer: Self-pay | Admitting: Internal Medicine

## 2021-02-01 DIAGNOSIS — S72141K Displaced intertrochanteric fracture of right femur, subsequent encounter for closed fracture with nonunion: Secondary | ICD-10-CM

## 2021-02-01 HISTORY — PX: INTRAMEDULLARY (IM) NAIL INTERTROCHANTERIC: SHX5875

## 2021-02-01 LAB — CBC
HCT: 30.5 % — ABNORMAL LOW (ref 36.0–46.0)
HCT: 30.2 % — ABNORMAL LOW (ref 36.0–46.0)
Hemoglobin: 9.4 g/dL — ABNORMAL LOW (ref 12.0–15.0)
Hemoglobin: 9.4 g/dL — ABNORMAL LOW (ref 12.0–15.0)
MCH: 32.3 pg (ref 26.0–34.0)
MCH: 32.4 pg (ref 26.0–34.0)
MCHC: 30.8 g/dL (ref 30.0–36.0)
MCHC: 31.1 g/dL (ref 30.0–36.0)
MCV: 104.8 fL — ABNORMAL HIGH (ref 80.0–100.0)
MCV: 104.1 fL — ABNORMAL HIGH (ref 80.0–100.0)
Platelets: 251 10*3/uL (ref 150–400)
Platelets: 248 10*3/uL (ref 150–400)
RBC: 2.91 MIL/uL — ABNORMAL LOW (ref 3.87–5.11)
RBC: 2.9 MIL/uL — ABNORMAL LOW (ref 3.87–5.11)
RDW: 13.5 % (ref 11.5–15.5)
RDW: 13.5 % (ref 11.5–15.5)
WBC: 7.5 10*3/uL (ref 4.0–10.5)
WBC: 11.7 10*3/uL — ABNORMAL HIGH (ref 4.0–10.5)
nRBC: 0 % (ref 0.0–0.2)
nRBC: 0 % (ref 0.0–0.2)

## 2021-02-01 LAB — BASIC METABOLIC PANEL
Anion gap: 6 (ref 5–15)
BUN: 28 mg/dL — ABNORMAL HIGH (ref 8–23)
CO2: 29 mmol/L (ref 22–32)
Calcium: 8.9 mg/dL (ref 8.9–10.3)
Chloride: 104 mmol/L (ref 98–111)
Creatinine, Ser: 0.88 mg/dL (ref 0.44–1.00)
GFR, Estimated: >60 mL/min (ref >60)
Glucose, Bld: 138 mg/dL — ABNORMAL HIGH (ref 70–99)
Potassium: 4.9 mmol/L (ref 3.5–5.1)
Sodium: 139 mmol/L (ref 135–145)

## 2021-02-01 LAB — GLUCOSE, CAPILLARY: Glucose-Capillary: 105 mg/dL — ABNORMAL HIGH (ref 70–99)

## 2021-02-01 SURGERY — FIXATION, FRACTURE, INTERTROCHANTERIC, WITH INTRAMEDULLARY ROD
Anesthesia: Monitor Anesthesia Care | Laterality: Right

## 2021-02-01 MED ORDER — BUPIVACAINE IN DEXTROSE 0.75-8.25 % IT SOLN
INTRATHECAL | Status: DC | PRN
  Administered 2021-02-01: 1.4 mL via INTRATHECAL

## 2021-02-01 MED ORDER — ONDANSETRON HCL 4 MG PO TABS: 4.0000 mg | ORAL_TABLET | Freq: Four times a day (QID) | ORAL | Status: DC | PRN

## 2021-02-01 MED ORDER — FENTANYL CITRATE (PF) 250 MCG/5ML IJ SOLN
INTRAMUSCULAR | Status: DC | PRN
  Administered 2021-02-01: 25 ug via INTRAVENOUS

## 2021-02-01 MED ORDER — LACTATED RINGERS IV SOLN: INTRAVENOUS | Status: DC | PRN

## 2021-02-01 MED ORDER — ONDANSETRON HCL 4 MG/2ML IJ SOLN: 4.0000 mg | Freq: Four times a day (QID) | INTRAMUSCULAR | Status: DC | PRN

## 2021-02-01 MED ORDER — ONDANSETRON HCL 4 MG/2ML IJ SOLN: 4.0000 mg | Freq: Once | INTRAMUSCULAR | Status: DC | PRN

## 2021-02-01 MED ORDER — PHENYLEPHRINE HCL-NACL 10-0.9 MG/250ML-% IV SOLN
INTRAVENOUS | Status: DC | PRN
  Administered 2021-02-01: 20 ug/min via INTRAVENOUS

## 2021-02-01 MED ORDER — OXYCODONE HCL 5 MG PO TABS
5.0000 mg | ORAL_TABLET | Freq: Once | ORAL | Status: DC | PRN
Start: 2021-02-01 — End: 2021-02-01

## 2021-02-01 MED ORDER — PROPOFOL 10 MG/ML IV BOLUS
INTRAVENOUS | Status: AC
  Filled 2021-02-01: qty 20

## 2021-02-01 MED ORDER — 0.9 % SODIUM CHLORIDE (POUR BTL) OPTIME
TOPICAL | Status: DC | PRN
  Administered 2021-02-01: 1000 mL

## 2021-02-01 MED ORDER — PROPOFOL 500 MG/50ML IV EMUL
INTRAVENOUS | Status: DC | PRN
  Administered 2021-02-01: 20 ug/kg/min via INTRAVENOUS

## 2021-02-01 MED ORDER — METOCLOPRAMIDE HCL 5 MG PO TABS: 5.0000 mg | ORAL_TABLET | Freq: Three times a day (TID) | ORAL | Status: DC | PRN

## 2021-02-01 MED ORDER — LACTATED RINGERS IV SOLN: INTRAVENOUS | Status: DC

## 2021-02-01 MED ORDER — PHENOL 1.4 % MT LIQD: 1.0000 | OROMUCOSAL | Status: DC | PRN

## 2021-02-01 MED ORDER — CHLORHEXIDINE GLUCONATE 0.12 % MT SOLN
OROMUCOSAL | Status: AC
  Filled 2021-02-01: qty 15

## 2021-02-01 MED ORDER — FENTANYL CITRATE (PF) 250 MCG/5ML IJ SOLN
INTRAMUSCULAR | Status: AC
  Filled 2021-02-01: qty 5

## 2021-02-01 MED ORDER — ACETAMINOPHEN 325 MG PO TABS
650.0000 mg | ORAL_TABLET | Freq: Once | ORAL | Status: AC
  Administered 2021-02-01: 650 mg via ORAL
  Filled 2021-02-01: qty 2

## 2021-02-01 MED ORDER — METOCLOPRAMIDE HCL 5 MG/ML IJ SOLN: 5.0000 mg | Freq: Three times a day (TID) | INTRAMUSCULAR | Status: DC | PRN

## 2021-02-01 MED ORDER — CHLORHEXIDINE GLUCONATE 0.12 % MT SOLN
15.0000 mL | OROMUCOSAL | Status: AC
  Filled 2021-02-01 (×2): qty 15

## 2021-02-01 MED ORDER — PSYLLIUM 95 % PO PACK
1.0000 | PACK | Freq: Every day | ORAL | Status: DC
  Administered 2021-02-02 – 2021-02-07 (×6): 1 via ORAL
  Filled 2021-02-01 (×8): qty 1

## 2021-02-01 MED ORDER — OXYCODONE HCL 5 MG/5ML PO SOLN: 5.0000 mg | Freq: Once | ORAL | Status: DC | PRN

## 2021-02-01 MED ORDER — FENTANYL CITRATE (PF) 100 MCG/2ML IJ SOLN: 25.0000 ug | INTRAMUSCULAR | Status: DC | PRN

## 2021-02-01 MED ORDER — DOCUSATE SODIUM 100 MG PO CAPS
100.0000 mg | ORAL_CAPSULE | Freq: Two times a day (BID) | ORAL | Status: DC
  Administered 2021-02-01 – 2021-02-07 (×10): 100 mg via ORAL
  Filled 2021-02-01 (×11): qty 1

## 2021-02-01 MED ORDER — VANCOMYCIN HCL 1000 MG IV SOLR
INTRAVENOUS | Status: AC
  Filled 2021-02-01: qty 1000

## 2021-02-01 MED ORDER — TRANEXAMIC ACID-NACL 1000-0.7 MG/100ML-% IV SOLN
1000.0000 mg | Freq: Once | INTRAVENOUS | Status: AC
  Administered 2021-02-01: 1000 mg via INTRAVENOUS
  Filled 2021-02-01: qty 100

## 2021-02-01 MED ORDER — PROPOFOL 10 MG/ML IV BOLUS
INTRAVENOUS | Status: DC | PRN
  Administered 2021-02-01 (×2): 10 mg via INTRAVENOUS

## 2021-02-01 MED ORDER — MENTHOL 3 MG MT LOZG: 1.0000 | LOZENGE | OROMUCOSAL | Status: DC | PRN

## 2021-02-01 MED ORDER — VANCOMYCIN HCL 1000 MG IV SOLR: INTRAVENOUS | Status: DC | PRN

## 2021-02-01 SURGICAL SUPPLY — 52 items
ADH SKN CLS APL DERMABOND .7 (GAUZE/BANDAGES/DRESSINGS) ×1
APL PRP STRL LF DISP 70% ISPRP (MISCELLANEOUS) ×1
BIT DRILL INTERTAN LAG SCREW (BIT) ×1 IMPLANT
BIT DRILL SHORT 4.0 (BIT) IMPLANT
BRUSH SCRUB EZ PLAIN DRY (MISCELLANEOUS) ×2 IMPLANT
CHLORAPREP W/TINT 26 (MISCELLANEOUS) ×2 IMPLANT
COVER PERINEAL POST (MISCELLANEOUS) ×2 IMPLANT
COVER SURGICAL LIGHT HANDLE (MISCELLANEOUS) ×2 IMPLANT
COVER WAND RF STERILE (DRAPES) IMPLANT
DERMABOND ADVANCED (GAUZE/BANDAGES/DRESSINGS) ×1
DERMABOND ADVANCED .7 DNX12 (GAUZE/BANDAGES/DRESSINGS) ×1 IMPLANT
DRAPE C-ARM 35X43 STRL (DRAPES) ×2 IMPLANT
DRAPE IMP U-DRAPE 54X76 (DRAPES) ×4 IMPLANT
DRAPE INCISE IOBAN 66X45 STRL (DRAPES) ×2 IMPLANT
DRAPE STERI IOBAN 125X83 (DRAPES) ×2 IMPLANT
DRAPE SURG 17X23 STRL (DRAPES) ×4 IMPLANT
DRAPE U-SHAPE 47X51 STRL (DRAPES) ×2 IMPLANT
DRESSING MEPILEX FLEX 4X4 (GAUZE/BANDAGES/DRESSINGS) IMPLANT
DRILL BIT SHORT 4.0 (BIT) ×2
DRSG MEPILEX BORDER 4X4 (GAUZE/BANDAGES/DRESSINGS) ×2 IMPLANT
DRSG MEPILEX BORDER 4X8 (GAUZE/BANDAGES/DRESSINGS) ×1 IMPLANT
DRSG MEPILEX FLEX 4X4 (GAUZE/BANDAGES/DRESSINGS) ×6
ELECT REM PT RETURN 9FT ADLT (ELECTROSURGICAL) ×2
ELECTRODE REM PT RTRN 9FT ADLT (ELECTROSURGICAL) ×1 IMPLANT
GLOVE BIO SURGEON STRL SZ 6.5 (GLOVE) ×6 IMPLANT
GLOVE BIO SURGEON STRL SZ7.5 (GLOVE) ×8 IMPLANT
GLOVE BIOGEL PI IND STRL 7.5 (GLOVE) ×1 IMPLANT
GLOVE BIOGEL PI INDICATOR 7.5 (GLOVE) ×1
GLOVE SURG UNDER POLY LF SZ6.5 (GLOVE) ×2 IMPLANT
GOWN STRL REUS W/ TWL LRG LVL3 (GOWN DISPOSABLE) ×1 IMPLANT
GOWN STRL REUS W/TWL LRG LVL3 (GOWN DISPOSABLE) ×2
GUIDE PIN 3.2X343 (PIN) ×2
GUIDE PIN 3.2X343MM (PIN) ×4
GUIDE ROD 3.0 (MISCELLANEOUS) ×2
KIT BASIN OR (CUSTOM PROCEDURE TRAY) ×2 IMPLANT
KIT TURNOVER KIT B (KITS) ×2 IMPLANT
MANIFOLD NEPTUNE II (INSTRUMENTS) ×2 IMPLANT
NAIL LOCK CANN 10X380 130D RT (Nail) ×1 IMPLANT
NS IRRIG 1000ML POUR BTL (IV SOLUTION) ×2 IMPLANT
PACK GENERAL/GYN (CUSTOM PROCEDURE TRAY) ×2 IMPLANT
PAD ARMBOARD 7.5X6 YLW CONV (MISCELLANEOUS) ×4 IMPLANT
PIN GUIDE 3.2X343MM (PIN) IMPLANT
ROD GUIDE 3.0 (MISCELLANEOUS) IMPLANT
SCREW LAG COMPR KIT 95/90 (Screw) ×1 IMPLANT
SCREW TRIGEN LOW PROF 5.0X37.5 (Screw) ×1 IMPLANT
SUT MNCRL AB 3-0 PS2 18 (SUTURE) ×2 IMPLANT
SUT VIC AB 0 CT1 27 (SUTURE)
SUT VIC AB 0 CT1 27XBRD ANBCTR (SUTURE) IMPLANT
SUT VIC AB 2-0 CT1 27 (SUTURE) ×4
SUT VIC AB 2-0 CT1 TAPERPNT 27 (SUTURE) ×2 IMPLANT
TOWEL GREEN STERILE (TOWEL DISPOSABLE) ×4 IMPLANT
WATER STERILE IRR 1000ML POUR (IV SOLUTION) ×2 IMPLANT

## 2021-02-01 NOTE — Anesthesia Procedure Notes (Signed)
Spinal  Patient location during procedure: OR Start time: 02/01/2021 4:05 PM End time: 02/01/2021 4:11 PM Reason for block: surgical anesthesia Staffing Performed: anesthesiologist  Anesthesiologist: Suzette Battiest, MD Preanesthetic Checklist Completed: patient identified, IV checked, site marked, risks and benefits discussed, surgical consent, monitors and equipment checked, pre-op evaluation and timeout performed Spinal Block Patient position: right lateral decubitus Prep: DuraPrep Patient monitoring: heart rate, cardiac monitor, continuous pulse ox and blood pressure Approach: midline Location: L4-5 Injection technique: single-shot Needle Needle type: Sprotte  Needle gauge: 24 G Needle length: 9 cm Assessment Sensory level: T4 Events: CSF return

## 2021-02-01 NOTE — Progress Notes (Signed)
When completing CAGE - AID, daughter expressed concern that pt has not had any output that was measurable in canister from [place]-- discussed with pt's RN- [place]- who will follow up with pt.

## 2021-02-01 NOTE — Interval H&P Note (Signed)
History and Physical Interval Note:  02/01/2021 3:10 PM  [name]  has presented today for surgery, with the diagnosis of Right hip fx.  The various methods of treatment have been discussed with the patient and family. After consideration of risks, benefits and other options for treatment, the patient has consented to  Procedure(s): INTRAMEDULLARY (IM) NAIL INTERTROCHANTRIC (Right) as a surgical intervention.  The patient's history has been reviewed, patient examined, no change in status, stable for surgery.  I have reviewed the patient's chart and labs.  Questions were answered to the patient's satisfaction.     Lennette Bihari P Britlyn Martine

## 2021-02-01 NOTE — TOC CAGE-AID Note (Signed)
Transition of Care [facility]) - CAGE-AID Screening   Patient Details  Name: [name] MRN: [id number] Date of Birth: 06-21-1923   Elvina Sidle, RN Trauma Response Nurse Phone Number: 02/01/2021, 10:26 AM      CAGE-AID Screening:    Have You Ever Felt You Ought to Cut Down on Your Drinking or Drug Use?: No Have People Annoyed You By [employer] Your Drinking Or Drug Use?: No Have You Felt Bad Or Guilty About Your Drinking Or Drug Use?: No Have You Ever Had a Drink or Used Drugs First Thing In The Morning to Steady Your Nerves or to Get Rid of a Hangover?: No CAGE-AID Score: 0  Substance Abuse Education Offered: No (pt does not drink alcohol or use drugs)

## 2021-02-01 NOTE — Transfer of Care (Signed)
Immediate Anesthesia Transfer of Care Note  Patient: [name]  Procedure(s) Performed: INTRAMEDULLARY (IM) NAIL INTERTROCHANTRIC (Right )  Patient Location: PACU  Anesthesia Type:MAC and Spinal  Level of Consciousness: awake, alert  and patient cooperative  Airway & Oxygen Therapy: Patient Spontanous Breathing  Post-op Assessment: Report given to RN and Post -op Vital signs reviewed and stable  Post vital signs: Reviewed and stable  Last Vitals:  Vitals Value Taken Time  BP 117/64 02/01/21 1732  Temp    Pulse 88 02/01/21 1733  Resp 17 02/01/21 1733  SpO2 99 % 02/01/21 1733  Vitals shown include unvalidated device data.  Last Pain:  Vitals:   02/01/21 0830  TempSrc:   PainSc: Asleep         Complications: No complications documented.

## 2021-02-01 NOTE — Anesthesia Preprocedure Evaluation (Addendum)
Anesthesia Evaluation  Patient identified by MRN, date of birth, ID band Patient awake    Reviewed: Allergy & Precautions, NPO status , Patient's Chart, lab work & pertinent test results  Airway Mallampati: II  TM Distance: >3 FB Neck ROM: Full    Dental  (+) Dental Advisory Given   Pulmonary neg pulmonary ROS,    breath sounds clear to auscultation       Cardiovascular hypertension, Pt. on medications  Rhythm:Regular Rate:Normal     Neuro/Psych  Headaches, PSYCHIATRIC DISORDERS Anxiety Depression    GI/Hepatic Neg liver ROS, GERD  Medicated and Controlled,  Endo/Other  diabetes (diet controlled)Hypothyroidism   Renal/GU negative Renal ROS  negative genitourinary   Musculoskeletal  (+) Arthritis , Osteoarthritis,  Right hip fx   Abdominal   Peds  Hematology negative hematology ROS (+)   Anesthesia Other Findings   Reproductive/Obstetrics negative OB ROS                            Anesthesia Physical Anesthesia Plan  ASA: III  Anesthesia Plan: MAC and Spinal   Post-op Pain Management:    Induction:   PONV Risk Score and Plan: 2 and Propofol infusion and TIVA  Airway Management Planned: Natural Airway and Nasal Cannula  Additional Equipment: None  Intra-op Plan:   Post-operative Plan:   Informed Consent: I have reviewed the patients History and Physical, chart, labs and discussed the procedure including the risks, benefits and alternatives for the proposed anesthesia with the patient or authorized representative who has indicated his/her understanding and acceptance.   Patient has DNR.     Plan Discussed with: CRNA  Anesthesia Plan Comments:       Anesthesia Quick Evaluation

## 2021-02-01 NOTE — Op Note (Signed)
Orthopaedic Surgery Operative Note (CSN: [id number] ) Date of Surgery: 02/01/2021  Admit Date: 01/31/2021   Diagnoses: Pre-Op Diagnoses: Right intertrochanteric femur fracture   Post-Op Diagnosis: Same  Procedures: CPT 27245-Cephalomedullary nailing of right intertrochanteric femur fracture  Surgeons : Primary: Shona Needles, MD  Assistant: Stacy Pace, PA-C  Location: OR 7   Anesthesia:Spinal   Antibiotics: Ancef 2g preop   Tourniquet time:None    Estimated Blood UXLK:440 mL  Complications:None  Specimens:None   Implants: Implant Name Type Inv. Item Serial No. Manufacturer Lot No. LRB No. Used Action  NAIL LOCK CANN 10X380 130D RT - [id number] Nail NAIL LOCK CANN 10X380 130D RT  [employer] [id number] Right 1 Implanted  SCREW LAG COMBO 95.90 - [id number] Screw SCREW LAG COMBO 95.90  [employer] [id number] Right 1 Implanted  SCREW LAG COMBO 95.90 - [id number] Screw SCREW LAG COMBO 95.90  [employer] [id number] Right 1 Implanted  SCREW TRIGEN LOW PROF 5.0X37.5 - [id number] Screw SCREW TRIGEN LOW PROF 5.0X37.5  [employer] [id number] Right 1 Implanted  SCREW TRIGEN LOW PROF 5.0X37.5 - [id number] Screw SCREW TRIGEN LOW PROF 5.0X37.5  [employer] [id number] Right 1 Implanted     Indications for Surgery: 87 year old female who sustained a right intertrochanteric femur fracture.  Due to the unstable nature of her injury I recommend proceeding with cephalomedullary nailing of the right hip.  Risks and benefits were discussed with the patient and her daughter.  Risks include but not limited to bleeding, infection, malunion, nonunion, hardware failure, hardware irritation, nerve or blood vessel injury, DVT, even the possibility anesthetic complications.  The patient agreed to proceed with surgery and consent was obtained.Marland Kitchen  Operative Findings: Cephalomedullary nailing of right intertrochanteric femur  fracture using [employer] InterTAN 10 x 380 mm nail with a 1 distal interlocking screw and a 95 mm lag screw with a 90 mm compression screw.  Procedure: The patient was identified in the preoperative holding area. Consent was confirmed with the patient and their family and all questions were answered. The operative extremity was marked after confirmation with the patient. she was then brought back to the operating room by our anesthesia colleagues.  She was placed under spinal anesthetic and carefully transferred over to the [facility] table.  All bony prominences were well-padded.  Fluoroscopic imaging was obtained of the right hip.  Traction was applied to obtain adequate reduction.  The right lower extremity was then prepped and draped in usual sterile fashion.  A timeout was performed to verify the patient, the procedure, and the extremity.  Preoperative antibiotics were dosed.  Small incision proximal to the greater trochanter was made.  I then directed a threaded guidewire at the tip of the greater trochanter into the proximal metaphysis.  I confirmed positioning with AP and lateral fluoroscopic imaging.  I then used an entry reamer to enter the medullary canal.  I then passed a ball-tipped guidewire down the central canal and seated into the distal metaphysis.  I measured the length and chose to use a 380 mm nail.  I then reamed with a 10 mm nail and increase this to 11.5 to fit a 10 mm nail.  I passed this with the targeting arm attached.  I seated it until it was in the appropriate position on AP and lateral view.  Made incision on the lateral side of the thigh and I used the targeting arm to direct a threaded guidewire at  the the head/neck segment into the appropriate position.  Adequate tip apex distance was obtained.  I then measured the length and chose to use a 95 mm lag screw.  I drilled the path for the compression screw and placed a antirotation bar.  I then drilled the path for the lag screw  and placed the lag screw.  I was able to get a small amount of compression with the compression screw.  I then statically locked the proximal portion of the nail.  I let off traction and I proceeded to place a lateral to medial distal interlocking screw using perfect circle technique.  Final fluoroscopic imaging was obtained.  The incisions were copiously irrigated.  Layered closure 2-0 Vicryl and 3-0 Monocryl Dermabond was used to close the skin.  Sterile dressings were placed.  The patient was then awoken from anesthesia and taken to the PACU in stable condition.   Post Op Plan/Instructions: Patient will be weightbearing as tolerated to the right lower extremity.  She will receive postoperative Ancef.  She will receive Lovenox for DVT prophylaxis.  We will have her mobilize with physical and Occupational Therapy.  I was present and performed the entire surgery.  Stacy Pace, PA-C did assist me throughout the case. An assistant was necessary given the difficulty in approach, maintenance of reduction and ability to instrument the fracture.   Stacy Hamming, MD [facility]

## 2021-02-01 NOTE — Progress Notes (Signed)
Pt arrived to unit. Alert and oriented X4. Vitals stable. No complaints of pain. Pt has decreased sensation to RLE d/t block. Continuous pulse ox hooked up, telemetry on. Night shift will take over.

## 2021-02-01 NOTE — Progress Notes (Signed)
PROGRESS NOTE    [name]  DUK:[id number] DOB: 01-15-1923 DOA: 01/31/2021 PCP: Myrlene Broker, MD    Brief Narrative:  [name] is a 87 y.o. female with medical history significant of HTN, HLD, DM type II, osteoporosis, anxiety, depression, hypothyroidism no longer on medication presents after having a fall at home with complaints of pain on the right buttock and leg.  Normally patient gets around with the use of a rolling walker and has been trying to open the front door when she fell.  She reported that the door was sticking him when she turned around to get back to her walker lost her balance landing on her right side.  She bumped her head, but denies any loss of consciousness.  Thereafter patient reported severe pain in her right leg and also her arm.  Her last fall was back in 03/2020, where she suffered fractures T11 and T12.  Her last bowel movement was yesterday.  She is currently not on any blood thinners.  Daughter notes that she had recently been started on Paxil and mirtazapine for issues with depression.  ED Course: Upon admission into the emergency department patient was seen to be afebrile with blood pressure 151/99-180 7/74 and all other vital signs maintained.  ET imaging of the head and cervical spine showed no acute abnormalities.  X-rays revealed a highly communicated right femur intertrochanteric fracture.  Orthopedics was consulted.  Patient had received 500 mL of normal saline IV fluids and pain medication.  TRH called to admit.  4/27-plan for surgery today    Consultants:   Orthopedics  Procedures:   Antimicrobials:       Subjective: Opens eyes, does not respond to my questions. No overnight issues  Objective: Vitals:   01/31/21 1630 01/31/21 1735 01/31/21 2109 02/01/21 0814  BP: (!) 132/54 113/77 (!) 124/51 (!) 139/57  Pulse: 80 (!) 101 84 81  Resp: 18 17 17 17   Temp: 98.6 F (37 C) 97.7 F (36.5 C) 98.1 F (36.7 C) 97.8 F (36.6 C)   TempSrc: Oral Oral Oral Oral  SpO2: 94% 98% 95% 99%  Weight:      Height:        Intake/Output Summary (Last 24 hours) at 02/01/2021 0842 Last data filed at 01/31/2021 1149 Gross per 24 hour  Intake 500 ml  Output --  Net 500 ml   Filed Weights   01/31/21 0916  Weight: 54.4 kg    Examination:  General exam: Appears calm and comfortable  Respiratory system: Clear to auscultation. Respiratory effort normal. Cardiovascular system: S1 & S2 heard, RRR. No JVD, murmurs, rubs, gallops or clicks.  Gastrointestinal system: Abdomen is nondistended, soft and nontender. . Normal bowel sounds heard. Central nervous system: Grossly intact Extremities: No edema Skin: Warm dry Psychiatry: Mood & affect appropriate in current setting.     Data Reviewed: I have personally reviewed following labs and imaging studies  CBC: Recent Labs  Lab 01/31/21 0942 02/01/21 0308  WBC 8.3 7.5  HGB 12.1 9.4*  HCT 39.0 30.5*  MCV 104.3* 104.8*  PLT 286 376   Basic Metabolic Panel: Recent Labs  Lab 01/31/21 0942 02/01/21 0308  NA 139 139  K 4.2 4.9  CL 107 104  CO2 24 29  GLUCOSE 155* 138*  BUN 27* 28*  CREATININE 0.90 0.88  CALCIUM 9.2 8.9   GFR: Estimated Creatinine Clearance: 32.8 mL/min (by C-G formula based on SCr of 0.88 mg/dL). Liver Function Tests: Recent Labs  Lab 01/31/21 0942  AST 24  ALT 19  ALKPHOS 57  BILITOT 0.8  PROT 6.5  ALBUMIN 3.5   No results for input(s): LIPASE, AMYLASE in the last 168 hours. No results for input(s): AMMONIA in the last 168 hours. Coagulation Profile: No results for input(s): INR, PROTIME in the last 168 hours. Cardiac Enzymes: No results for input(s): CKTOTAL, CKMB, CKMBINDEX, TROPONINI in the last 168 hours. BNP (last 3 results) No results for input(s): PROBNP in the last 8760 hours. HbA1C: No results for input(s): HGBA1C in the last 72 hours. CBG: No results for input(s): GLUCAP in the last 168 hours. Lipid Profile: No results  for input(s): CHOL, HDL, LDLCALC, TRIG, CHOLHDL, LDLDIRECT in the last 72 hours. Thyroid Function Tests: Recent Labs    01/31/21 1555  TSH 1.720   Anemia Panel: No results for input(s): VITAMINB12, FOLATE, FERRITIN, TIBC, IRON, RETICCTPCT in the last 72 hours. Sepsis Labs: No results for input(s): PROCALCITON, LATICACIDVEN in the last 168 hours.  Recent Results (from the past 240 hour(s))  Resp Panel by RT-PCR (Flu A&B, Covid) Nasopharyngeal Swab     Status: None   Collection Time: 01/31/21 10:48 AM   Specimen: Nasopharyngeal Swab; Nasopharyngeal(NP) swabs in vial transport medium  Result Value Ref Range Status   SARS Coronavirus 2 by RT PCR NEGATIVE NEGATIVE Final    Comment: (NOTE) SARS-CoV-2 target nucleic acids are NOT DETECTED.  The SARS-CoV-2 RNA is generally detectable in upper respiratory specimens during the acute phase of infection. The lowest concentration of SARS-CoV-2 viral copies this assay can detect is 138 copies/mL. A negative result does not preclude SARS-Cov-2 infection and should not be used as the sole basis for treatment or other patient management decisions. A negative result may occur with  improper specimen collection/handling, submission of specimen other than nasopharyngeal swab, presence of viral mutation(s) within the areas targeted by this assay, and inadequate number of viral copies(<138 copies/mL). A negative result must be combined with clinical observations, patient history, and epidemiological information. The expected result is Negative.  Fact Sheet for Patients:  EntrepreneurPulse.com.au  Fact Sheet for Healthcare Providers:  IncredibleEmployment.be  This test is no t yet approved or cleared by the [place] FDA and  has been authorized for detection and/or diagnosis of SARS-CoV-2 by FDA under an Emergency Use Authorization (EUA). This EUA will remain  in effect (meaning this test can be used) for  the duration of the COVID-19 declaration under Section 564(b)(1) of the Act, 21 U.S.C.section 360bbb-3(b)(1), unless the authorization is terminated  or revoked sooner.       Influenza A by PCR NEGATIVE NEGATIVE Final   Influenza B by PCR NEGATIVE NEGATIVE Final    Comment: (NOTE) The Xpert Xpress SARS-CoV-2/FLU/RSV plus assay is intended as an aid in the diagnosis of influenza from Nasopharyngeal swab specimens and should not be used as a sole basis for treatment. Nasal washings and aspirates are unacceptable for Xpert Xpress SARS-CoV-2/FLU/RSV testing.  Fact Sheet for Patients: EntrepreneurPulse.com.au  Fact Sheet for Healthcare Providers: IncredibleEmployment.be  This test is not yet approved or cleared by the [place] FDA and has been authorized for detection and/or diagnosis of SARS-CoV-2 by FDA under an Emergency Use Authorization (EUA). This EUA will remain in effect (meaning this test can be used) for the duration of the COVID-19 declaration under Section 564(b)(1) of the Act, 21 U.S.C. section 360bbb-3(b)(1), unless the authorization is terminated or revoked.  Performed at [facility], [place] [address],  [place], [place] [address]   Surgical PCR screen     Status: None   Collection Time: 01/31/21  4:03 PM   Specimen: Nasal Mucosa; Nasal Swab  Result Value Ref Range Status   MRSA, PCR NEGATIVE NEGATIVE Final   Staphylococcus aureus NEGATIVE NEGATIVE Final    Comment: (NOTE) The Xpert SA Assay (FDA approved for NASAL specimens in patients 52 years of age and older), is one component of a comprehensive surveillance program. It is not intended to diagnose infection nor to guide or monitor treatment. Performed at [facility], [place] [address], [place], [place] [address]          Radiology Studies: DG Chest 1 View  Result Date: 01/31/2021 CLINICAL DATA:  Fall. EXAM: CHEST  1 VIEW COMPARISON:  None. FINDINGS:  Similar cardiomediastinal silhouette. Aortic atherosclerosis. Both lungs are clear. No visible pleural effusions or pneumothorax on this single AP radiograph. No evidence of acute osseous abnormality. Polyarticular degenerative change. IMPRESSION: No evidence of acute cardiopulmonary disease. Electronically Signed   By: Margaretha Sheffield MD   On: 01/31/2021 10:45   DG Lumbar Spine Complete  Result Date: 01/31/2021 CLINICAL DATA:  87 year old female status post fall at home. Back pain. EXAM: LUMBAR SPINE - COMPLETE 4+ VIEW COMPARISON:  Lumbar spine and chest CT 03/08/2020. FINDINGS: Chronic T11 and T12 vertebral fractures with progressive kyphotic deformity since last year. Previously augmented T12 level as before. No associated spondylolisthesis is evident. L1 through L5 appear stable and intact. L1 benign vertebral body hemangioma better demonstrated by CT. Stable lumbar lordosis. Chronic lumbar disc and endplate degeneration maximal at L1-L2. Other visible thoracic levels appear grossly intact. Grossly stable visible pelvis. Stable cholecystectomy clips. Calcified aortic atherosclerosis. IMPRESSION: 1. No acute osseous abnormality identified in the lumbar spine. 2. Expected evolution of the T11 and T12 vertebral fractures since last year with associated progressive kyphosis there. 3. Chronic lumbar spine degeneration. Aortic Atherosclerosis (ICD10-I70.0). Electronically Signed   By: Genevie Ann M.D.   On: 01/31/2021 10:48   CT Head Wo Contrast  Result Date: 01/31/2021 CLINICAL DATA:  87 year old female status post fall at home. EXAM: CT HEAD WITHOUT CONTRAST CT CERVICAL SPINE WITHOUT CONTRAST TECHNIQUE: Multidetector CT imaging of the head and cervical spine was performed following the standard protocol without intravenous contrast. Multiplanar CT image reconstructions of the cervical spine were also generated. COMPARISON:  Brain MRI 11/05/2005.  Head CT 02/29/2020. Thoracic spine MRI 03/10/2020. FINDINGS: CT  HEAD FINDINGS Brain: Stable cerebral volume since last year. No midline shift, ventriculomegaly, mass effect, evidence of mass lesion, intracranial hemorrhage or evidence of cortically based acute infarction. Patchy and confluent bilateral white matter hypodensity appears stable. No cortical encephalomalacia identified. Vascular: Calcified atherosclerosis at the skull base. No suspicious intracranial vascular hyperdensity. Skull: No fracture identified. Sinuses/Orbits: Bilateral middle ear and mastoid opacification is new from last year. Negative visible nasopharynx. Paranasal sinuses remain well aerated. Other: No acute orbit or scalp soft tissue finding. CT CERVICAL SPINE FINDINGS Alignment: Preserved cervical lordosis. Cervicothoracic junction alignment is within normal limits. Bilateral posterior element alignment is within normal limits. Skull base and vertebrae: Osteopenia. Visualized skull base is intact. No atlanto-occipital dissociation. C1 and C2 appear intact and aligned. No acute osseous abnormality identified. Multilevel degenerative cervical spine ankylosis, including C7-T1 bilateral facet ankylosis. Soft tissues and spinal canal: No prevertebral fluid or swelling. No visible canal hematoma. Disc levels: Bulky degenerative partially calcified ligamentous hypertrophy about the odontoid. Degenerative subchondral cyst at the right base of the  odontoid. Degenerative appearing ankylosis of C2 through C5. Advanced disc degeneration with vacuum dist at the unfused C6-C7 level. C7-T1 facet and interbody ankylosis. Upper chest: Visible upper thoracic levels appear stable and grossly intact. Stable right upper lobe lung nodule on series 9, image 88, benign. Calcified aortic atherosclerosis. IMPRESSION: 1. No acute traumatic injury identified in the head or cervical spine. 2. Stable non contrast CT appearance of the brain since last year. 3. New bilateral middle ear and mastoid opacification with no associated  skull base fracture identified. Query bilateral otitis media. 4. Multilevel cervical spine degeneration and ankylosis. 5. Aortic Atherosclerosis (ICD10-I70.0). Electronically Signed   By: Genevie Ann M.D.   On: 01/31/2021 10:57   CT Cervical Spine Wo Contrast  Result Date: 01/31/2021 CLINICAL DATA:  87 year old female status post fall at home. EXAM: CT HEAD WITHOUT CONTRAST CT CERVICAL SPINE WITHOUT CONTRAST TECHNIQUE: Multidetector CT imaging of the head and cervical spine was performed following the standard protocol without intravenous contrast. Multiplanar CT image reconstructions of the cervical spine were also generated. COMPARISON:  Brain MRI 11/05/2005.  Head CT 02/29/2020. Thoracic spine MRI 03/10/2020. FINDINGS: CT HEAD FINDINGS Brain: Stable cerebral volume since last year. No midline shift, ventriculomegaly, mass effect, evidence of mass lesion, intracranial hemorrhage or evidence of cortically based acute infarction. Patchy and confluent bilateral white matter hypodensity appears stable. No cortical encephalomalacia identified. Vascular: Calcified atherosclerosis at the skull base. No suspicious intracranial vascular hyperdensity. Skull: No fracture identified. Sinuses/Orbits: Bilateral middle ear and mastoid opacification is new from last year. Negative visible nasopharynx. Paranasal sinuses remain well aerated. Other: No acute orbit or scalp soft tissue finding. CT CERVICAL SPINE FINDINGS Alignment: Preserved cervical lordosis. Cervicothoracic junction alignment is within normal limits. Bilateral posterior element alignment is within normal limits. Skull base and vertebrae: Osteopenia. Visualized skull base is intact. No atlanto-occipital dissociation. C1 and C2 appear intact and aligned. No acute osseous abnormality identified. Multilevel degenerative cervical spine ankylosis, including C7-T1 bilateral facet ankylosis. Soft tissues and spinal canal: No prevertebral fluid or swelling. No visible canal  hematoma. Disc levels: Bulky degenerative partially calcified ligamentous hypertrophy about the odontoid. Degenerative subchondral cyst at the right base of the odontoid. Degenerative appearing ankylosis of C2 through C5. Advanced disc degeneration with vacuum dist at the unfused C6-C7 level. C7-T1 facet and interbody ankylosis. Upper chest: Visible upper thoracic levels appear stable and grossly intact. Stable right upper lobe lung nodule on series 9, image 88, benign. Calcified aortic atherosclerosis. IMPRESSION: 1. No acute traumatic injury identified in the head or cervical spine. 2. Stable non contrast CT appearance of the brain since last year. 3. New bilateral middle ear and mastoid opacification with no associated skull base fracture identified. Query bilateral otitis media. 4. Multilevel cervical spine degeneration and ankylosis. 5. Aortic Atherosclerosis (ICD10-I70.0). Electronically Signed   By: Genevie Ann M.D.   On: 01/31/2021 10:57   DG Knee Right Port  Result Date: 01/31/2021 CLINICAL DATA:  Right hip fracture. EXAM: PORTABLE RIGHT KNEE - 1-2 VIEW COMPARISON:  None. FINDINGS: Right knee arthroplasty in expected alignment. No periprosthetic lucency or fracture. There has been patellar resurfacing. No focal bone abnormality. No significant joint effusion. IMPRESSION: Right knee arthroplasty without complication or acute fracture. Electronically Signed   By: Keith Rake M.D.   On: 01/31/2021 21:38   DG Hip Unilat W or Wo Pelvis 2-3 Views Right  Result Date: 01/31/2021 CLINICAL DATA:  87 year old female status post fall at home.  Pain. EXAM: DG HIP (WITH  OR WITHOUT PELVIS) 2-3V RIGHT COMPARISON:  Hip series 02/29/2020. FINDINGS: Highly comminuted right femur intertrochanteric fracture with varus impaction. The right femoral head remains normally located. Extensive chronic bilateral pubic symphysis fractures appear stable. Grossly intact proximal left femur. No other acute osseous abnormality  identified about the pelvis. Negative visible bowel gas pattern. IMPRESSION: 1. Highly comminuted right femur intertrochanteric fracture with varus impaction. 2. Chronic bilateral pubic symphysis fractures. Electronically Signed   By: Genevie Ann M.D.   On: 01/31/2021 10:44        Scheduled Meds: . acetaminophen  650 mg Oral Once  . atorvastatin  20 mg Oral QHS  . chlorhexidine  60 mL Topical Once  . diclofenac Sodium  2-4 g Topical QHS  . enoxaparin (LOVENOX) injection  40 mg Subcutaneous Q24H  . lisinopril  40 mg Oral Daily  . meloxicam  7.5 mg Oral Daily  . mirtazapine  15 mg Oral QHS  . pantoprazole  40 mg Oral Daily  . PARoxetine  10 mg Oral QHS  . polyethylene glycol  17 g Oral Q lunch  . polyvinyl alcohol  1 drop Both Eyes QHS  . povidone-iodine  2 application Topical Once  . psyllium  1 packet Oral Daily  . senna-docusate  1 tablet Oral QHS   Continuous Infusions: .  ceFAZolin (ANCEF) IV    . tranexamic acid      Assessment & Plan:   Principal Problem:   Hip fracture ([place]) Active Problems:   Type II diabetes mellitus ([facility])   Hyperlipidemia associated with type 2 diabetes mellitus ([place])   Hypertension associated with diabetes ([place])   Fall at home, initial encounter   Closed comminuted intertrochanteric fracture of proximal end of right femur ([place])   DNR (do not resuscitate)   Closed communicated intertrochanteric fracture of the proximal end of the right femur secondary to fall at home: Acute.  Patient presents after losing her balance falling onto her right side.  X-rays revealed a highly communicated right femur intertrochanteric fracture normally patient has been getting around with use of a rolling walker.  Geriatric perioperative risk assessment score for probability of perioperative myocardial infarction or cardiac arrest 0.6%. -Admit to a medical telemetry bed -Hip fracture order set utilized -Hydrocodone/morphine as needed for moderate to severe  pain 4/27-ortho following, plan for surgery today  Essential hypertension:  Stable  Continue to monitor Continue with acei and hydralazine iv prn   History of hypothyroidism: Was on levothyroxine 25 MCG daily but has not taken it in some time Check TSH    Diabetes mellitus type 2:  Apparently diet controlled, not on any medication  continue to monitor      Hyperlipidemia -Continue atorvastatin  Depression and anxiety -Continue Paxil and mirtazapine  Osteoporosis: Patient with prior history of T12 and T11 fractures back in 03/2020 after having a fall.        DVT prophylaxis: Lovenox Code Status: DNR Family Communication: None at bedside  Status is: Inpatient  Remains inpatient appropriate because:Inpatient level of care appropriate due to severity of illness   Dispo: The patient is from: Home              Anticipated d/c is to: SNF              Patient currently is not medically stable to d/c.   Difficult to place patient No            LOS: 1 day   Time spent: 8  minutes with more than 50% on [place], MD [facility] Pager [phone]  If 7PM-7AM, please contact night-coverage 02/01/2021, 8:42 AM

## 2021-02-02 LAB — CBC
HCT: 24.4 % — ABNORMAL LOW (ref 36.0–46.0)
Hemoglobin: 7.8 g/dL — ABNORMAL LOW (ref 12.0–15.0)
MCH: 32.6 pg (ref 26.0–34.0)
MCHC: 32 g/dL (ref 30.0–36.0)
MCV: 102.1 fL — ABNORMAL HIGH (ref 80.0–100.0)
Platelets: 204 10*3/uL (ref 150–400)
RBC: 2.39 MIL/uL — ABNORMAL LOW (ref 3.87–5.11)
RDW: 13.4 % (ref 11.5–15.5)
WBC: 8.3 10*3/uL (ref 4.0–10.5)
nRBC: 0 % (ref 0.0–0.2)

## 2021-02-02 LAB — BASIC METABOLIC PANEL
Anion gap: 6 (ref 5–15)
BUN: 28 mg/dL — ABNORMAL HIGH (ref 8–23)
CO2: 26 mmol/L (ref 22–32)
Calcium: 8.3 mg/dL — ABNORMAL LOW (ref 8.9–10.3)
Chloride: 103 mmol/L (ref 98–111)
Creatinine, Ser: 0.89 mg/dL (ref 0.44–1.00)
GFR, Estimated: 60 mL/min — ABNORMAL LOW (ref >60)
Glucose, Bld: 146 mg/dL — ABNORMAL HIGH (ref 70–99)
Potassium: 4.5 mmol/L (ref 3.5–5.1)
Sodium: 135 mmol/L (ref 135–145)

## 2021-02-02 NOTE — Progress Notes (Signed)
Orthopaedic Trauma Progress Note  SUBJECTIVE: Reports minimal to mild pain about operative site at rest.  Has not been out of bed yet. No chest pain. No SOB. No nausea/vomiting. No other complaints.  Daughter is at bedside.  Patient is from home and lives with her husband who is 85 years old.  They do have around-the-clock care but daughter is leaning towards an acute rehab facility at discharge.  Would prefer a facility in the  area if possible.  OBJECTIVE:  Vitals:   02/01/21 2356 02/02/21 1000  BP: (!) 143/60 136/60  Pulse:  68  Resp: 16 18  Temp:  98.2 F (36.8 C)  SpO2: 94% 98%    General: Laying in bed, no acute distress.  Very hard of hearing. Respiratory: No increased work of breathing.  Right lower extremity: Dressings over hip are clean, dry, intact.  Distal dressing with mild strikethrough.  No significant tenderness with palpation over the lateral hip or throughout the thigh.  Tolerates ankle dorsiflexion and plantarflexion.  Able to wiggle toes.  Endorses sensation to light touch distally.  IMAGING: Stable post op imaging.   LABS:  Results for orders placed or performed during the hospital encounter of 01/31/21 (from the past 24 hour(s))  Glucose, capillary     Status: Abnormal   Collection Time: 02/01/21  5:32 PM  Result Value Ref Range   Glucose-Capillary 105 (H) 70 - 99 mg/dL  CBC     Status: Abnormal   Collection Time: 02/01/21  7:39 PM  Result Value Ref Range   WBC 11.7 (H) 4.0 - 10.5 K/uL   RBC 2.90 (L) 3.87 - 5.11 MIL/uL   Hemoglobin 9.4 (L) 12.0 - 15.0 g/dL   HCT 30.2 (L) 36.0 - 46.0 %   MCV 104.1 (H) 80.0 - 100.0 fL   MCH 32.4 26.0 - 34.0 pg   MCHC 31.1 30.0 - 36.0 g/dL   RDW 13.5 11.5 - 15.5 %   Platelets 248 150 - 400 K/uL   nRBC 0.0 0.0 - 0.2 %  Basic metabolic panel     Status: Abnormal   Collection Time: 02/02/21  4:58 AM  Result Value Ref Range   Sodium 135 135 - 145 mmol/L   Potassium 4.5 3.5 - 5.1 mmol/L   Chloride 103 98 - 111 mmol/L    CO2 26 22 - 32 mmol/L   Glucose, Bld 146 (H) 70 - 99 mg/dL   BUN 28 (H) 8 - 23 mg/dL   Creatinine, Ser 0.89 0.44 - 1.00 mg/dL   Calcium 8.3 (L) 8.9 - 10.3 mg/dL   GFR, Estimated 60 (L) >60 mL/min   Anion gap 6 5 - 15  CBC     Status: Abnormal   Collection Time: 02/02/21  4:58 AM  Result Value Ref Range   WBC 8.3 4.0 - 10.5 K/uL   RBC 2.39 (L) 3.87 - 5.11 MIL/uL   Hemoglobin 7.8 (L) 12.0 - 15.0 g/dL   HCT 24.4 (L) 36.0 - 46.0 %   MCV 102.1 (H) 80.0 - 100.0 fL   MCH 32.6 26.0 - 34.0 pg   MCHC 32.0 30.0 - 36.0 g/dL   RDW 13.4 11.5 - 15.5 %   Platelets 204 150 - 400 K/uL   nRBC 0.0 0.0 - 0.2 %    ASSESSMENT: [name] is a 87 y.o. female, 1 Day Post-Op s/p INTRAMEDULLARY  NAIL RIGHT INTERTROCHANTRIC FEMUR FRACTURE  CV/Blood loss: Acute blood loss anemia, Hgb 7.8 this morning.  Hemodynamically  stable  PLAN: Weightbearing: WBAT RLE Incisional and dressing care: Reinforce dressings as needed  Showering: Okay to begin showering 02/04/2021 if no drainage from incision Orthopedic device(s): None  Pain management:  1. Tylenol 500 mg q 6 hours PRN 2. Meloxicam 7.5 mg daily  3. Norco 5-325 mg q 6 hours PRN 4. Morphine 0.5 mg q 2 hours PRN VTE prophylaxis: Recommend holding Lovenox today until hemoglobin stabilizes, SCDs for now ID:  Ancef 2gm post op Foley/Lines:  No foley, KVO IVFs Impediments to Fracture Healing: Diabetes. Vitamin D level pending, will start supplementation as indicated Dispo: PT/OT evaluation today, will likely need SNF Follow - up plan: 2 weeks after hospital discharge for repeat x-rays and wound check  Contact information:  Katha Hamming MD, Patrecia Pace PA-C. After hours and holidays please check [email or web address] for group call information for [facility]   Miriam Kestler A. Ricci Barker, PA-C [phone] (office) [email or web address]

## 2021-02-02 NOTE — Plan of Care (Signed)
  Problem: Activity: Goal: Risk for activity intolerance will decrease Outcome: Progressing   Problem: Pain Managment: Goal: General experience of comfort will improve Outcome: Progressing   Problem: Safety: Goal: Ability to remain free from injury will improve Outcome: Progressing   

## 2021-02-02 NOTE — Progress Notes (Signed)
PROGRESS NOTE    [name]  [id number] DOB: May 04, 1923 DOA: 01/31/2021 PCP: Myrlene Broker, MD    Brief Narrative:  [name] is a 87 y.o. female with medical history significant of HTN, HLD, DM type II, osteoporosis, anxiety, depression, hypothyroidism no longer on medication presents after having a fall at home with complaints of pain on the right buttock and leg.  Normally patient gets around with the use of a rolling walker and has been trying to open the front door when she fell.  She reported that the door was sticking him when she turned around to get back to her walker lost her balance landing on her right side.  She bumped her head, but denies any loss of consciousness.  Thereafter patient reported severe pain in her right leg and also her arm.  Her last fall was back in 03/2020, where she suffered fractures T11 and T12.  Her last bowel movement was yesterday.  She is currently not on any blood thinners.  Daughter notes that she had recently been started on Paxil and mirtazapine for issues with depression.  ED Course: Upon admission into the emergency department patient was seen to be afebrile with blood pressure 151/99-180 7/74 and all other vital signs maintained.  ET imaging of the head and cervical spine showed no acute abnormalities.  X-rays revealed a highly communicated right femur intertrochanteric fracture.  Orthopedics was consulted.  Patient had received 500 mL of normal saline IV fluids and pain medication.  TRH called to admit.  4/28-no overnight issues. No cp, no sob. Hg 7.8   Consultants:   Orthopedics  Procedures:   Antimicrobials:       Subjective: Daughter at bedside. No complaints except needs to use the bathroom  Objective: Vitals:   02/01/21 1830 02/01/21 1856 02/01/21 2028 02/01/21 2356  BP: 130/71 (!) 155/60 (!) 125/49 (!) 143/60  Pulse: 85 85 67   Resp: 17 18  16   Temp: (!) 97.3 F (36.3 C) (!) 97.3 F (36.3 C) 98.3 F (36.8 C)  (!) 97.5 F (36.4 C)  TempSrc:  Oral Oral Oral  SpO2: 96% 95% 93% 94%  Weight:      Height:        Intake/Output Summary (Last 24 hours) at 02/02/2021 0818 Last data filed at 02/01/2021 1800 Gross per 24 hour  Intake 770 ml  Output 250 ml  Net 520 ml   Filed Weights   01/31/21 0916 02/01/21 1536  Weight: 54.4 kg 54.4 kg    Examination: nad cta no w/r/r Regular s1/s2 no gallop Soft benign +bs No edema Awake and alert Mood and affect appropriate in current setting    Data Reviewed: I have personally reviewed following labs and imaging studies  CBC: Recent Labs  Lab 01/31/21 0942 02/01/21 0308 02/01/21 1939 02/02/21 0458  WBC 8.3 7.5 11.7* 8.3  HGB 12.1 9.4* 9.4* 7.8*  HCT 39.0 30.5* 30.2* 24.4*  MCV 104.3* 104.8* 104.1* 102.1*  PLT 286 251 248 0000000   Basic Metabolic Panel: Recent Labs  Lab 01/31/21 0942 02/01/21 0308 02/02/21 0458  NA 139 139 135  K 4.2 4.9 4.5  CL 107 104 103  CO2 24 29 26   GLUCOSE 155* 138* 146*  BUN 27* 28* 28*  CREATININE 0.90 0.88 0.89  CALCIUM 9.2 8.9 8.3*   GFR: Estimated Creatinine Clearance: 32.5 mL/min (by C-G formula based on SCr of 0.89 mg/dL). Liver Function Tests: Recent Labs  Lab 01/31/21 0942  AST  24  ALT 19  ALKPHOS 57  BILITOT 0.8  PROT 6.5  ALBUMIN 3.5   No results for input(s): LIPASE, AMYLASE in the last 168 hours. No results for input(s): AMMONIA in the last 168 hours. Coagulation Profile: No results for input(s): INR, PROTIME in the last 168 hours. Cardiac Enzymes: No results for input(s): CKTOTAL, CKMB, CKMBINDEX, TROPONINI in the last 168 hours. BNP (last 3 results) No results for input(s): PROBNP in the last 8760 hours. HbA1C: No results for input(s): HGBA1C in the last 72 hours. CBG: Recent Labs  Lab 02/01/21 1732  GLUCAP 105*   Lipid Profile: No results for input(s): CHOL, HDL, LDLCALC, TRIG, CHOLHDL, LDLDIRECT in the last 72 hours. Thyroid Function Tests: Recent Labs     01/31/21 1555  TSH 1.720   Anemia Panel: No results for input(s): VITAMINB12, FOLATE, FERRITIN, TIBC, IRON, RETICCTPCT in the last 72 hours. Sepsis Labs: No results for input(s): PROCALCITON, LATICACIDVEN in the last 168 hours.  Recent Results (from the past 240 hour(s))  Resp Panel by RT-PCR (Flu A&B, Covid) Nasopharyngeal Swab     Status: None   Collection Time: 01/31/21 10:48 AM   Specimen: Nasopharyngeal Swab; Nasopharyngeal(NP) swabs in vial transport medium  Result Value Ref Range Status   SARS Coronavirus 2 by RT PCR NEGATIVE NEGATIVE Final    Comment: (NOTE) SARS-CoV-2 target nucleic acids are NOT DETECTED.  The SARS-CoV-2 RNA is generally detectable in upper respiratory specimens during the acute phase of infection. The lowest concentration of SARS-CoV-2 viral copies this assay can detect is 138 copies/mL. A negative result does not preclude SARS-Cov-2 infection and should not be used as the sole basis for treatment or other patient management decisions. A negative result may occur with  improper specimen collection/handling, submission of specimen other than nasopharyngeal swab, presence of viral mutation(s) within the areas targeted by this assay, and inadequate number of viral copies(<138 copies/mL). A negative result must be combined with clinical observations, patient history, and epidemiological information. The expected result is Negative.  Fact Sheet for Patients:  EntrepreneurPulse.com.au  Fact Sheet for Healthcare Providers:  IncredibleEmployment.be  This test is no t yet approved or cleared by the [place] FDA and  has been authorized for detection and/or diagnosis of SARS-CoV-2 by FDA under an Emergency Use Authorization (EUA). This EUA will remain  in effect (meaning this test can be used) for the duration of the COVID-19 declaration under Section 564(b)(1) of the Act, 21 U.S.C.section 360bbb-3(b)(1), unless the  authorization is terminated  or revoked sooner.       Influenza A by PCR NEGATIVE NEGATIVE Final   Influenza B by PCR NEGATIVE NEGATIVE Final    Comment: (NOTE) The Xpert Xpress SARS-CoV-2/FLU/RSV plus assay is intended as an aid in the diagnosis of influenza from Nasopharyngeal swab specimens and should not be used as a sole basis for treatment. Nasal washings and aspirates are unacceptable for Xpert Xpress SARS-CoV-2/FLU/RSV testing.  Fact Sheet for Patients: EntrepreneurPulse.com.au  Fact Sheet for Healthcare Providers: IncredibleEmployment.be  This test is not yet approved or cleared by the [place] FDA and has been authorized for detection and/or diagnosis of SARS-CoV-2 by FDA under an Emergency Use Authorization (EUA). This EUA will remain in effect (meaning this test can be used) for the duration of the COVID-19 declaration under Section 564(b)(1) of the Act, 21 U.S.C. section 360bbb-3(b)(1), unless the authorization is terminated or revoked.  Performed at [facility], [place] [address], [place], [place] [address]  Surgical PCR screen     Status: None   Collection Time: 01/31/21  4:03 PM   Specimen: Nasal Mucosa; Nasal Swab  Result Value Ref Range Status   MRSA, PCR NEGATIVE NEGATIVE Final   Staphylococcus aureus NEGATIVE NEGATIVE Final    Comment: (NOTE) The Xpert SA Assay (FDA approved for NASAL specimens in patients 93 years of age and older), is one component of a comprehensive surveillance program. It is not intended to diagnose infection nor to guide or monitor treatment. Performed at [facility], [place] [address] [place], [place] [address]          Radiology Studies: DG Chest 1 View  Result Date: 01/31/2021 CLINICAL DATA:  Fall. EXAM: CHEST  1 VIEW COMPARISON:  None. FINDINGS: Similar cardiomediastinal silhouette. Aortic atherosclerosis. Both lungs are clear. No visible pleural effusions or  pneumothorax on this single AP radiograph. No evidence of acute osseous abnormality. Polyarticular degenerative change. IMPRESSION: No evidence of acute cardiopulmonary disease. Electronically Signed   By: Margaretha Sheffield MD   On: 01/31/2021 10:45   DG Lumbar Spine Complete  Result Date: 01/31/2021 CLINICAL DATA:  87 year old female status post fall at home. Back pain. EXAM: LUMBAR SPINE - COMPLETE 4+ VIEW COMPARISON:  Lumbar spine and chest CT 03/08/2020. FINDINGS: Chronic T11 and T12 vertebral fractures with progressive kyphotic deformity since last year. Previously augmented T12 level as before. No associated spondylolisthesis is evident. L1 through L5 appear stable and intact. L1 benign vertebral body hemangioma better demonstrated by CT. Stable lumbar lordosis. Chronic lumbar disc and endplate degeneration maximal at L1-L2. Other visible thoracic levels appear grossly intact. Grossly stable visible pelvis. Stable cholecystectomy clips. Calcified aortic atherosclerosis. IMPRESSION: 1. No acute osseous abnormality identified in the lumbar spine. 2. Expected evolution of the T11 and T12 vertebral fractures since last year with associated progressive kyphosis there. 3. Chronic lumbar spine degeneration. Aortic Atherosclerosis (ICD10-I70.0). Electronically Signed   By: Genevie Ann M.D.   On: 01/31/2021 10:48   CT Head Wo Contrast  Result Date: 01/31/2021 CLINICAL DATA:  87 year old female status post fall at home. EXAM: CT HEAD WITHOUT CONTRAST CT CERVICAL SPINE WITHOUT CONTRAST TECHNIQUE: Multidetector CT imaging of the head and cervical spine was performed following the standard protocol without intravenous contrast. Multiplanar CT image reconstructions of the cervical spine were also generated. COMPARISON:  Brain MRI 11/05/2005.  Head CT 02/29/2020. Thoracic spine MRI 03/10/2020. FINDINGS: CT HEAD FINDINGS Brain: Stable cerebral volume since last year. No midline shift, ventriculomegaly, mass effect,  evidence of mass lesion, intracranial hemorrhage or evidence of cortically based acute infarction. Patchy and confluent bilateral white matter hypodensity appears stable. No cortical encephalomalacia identified. Vascular: Calcified atherosclerosis at the skull base. No suspicious intracranial vascular hyperdensity. Skull: No fracture identified. Sinuses/Orbits: Bilateral middle ear and mastoid opacification is new from last year. Negative visible nasopharynx. Paranasal sinuses remain well aerated. Other: No acute orbit or scalp soft tissue finding. CT CERVICAL SPINE FINDINGS Alignment: Preserved cervical lordosis. Cervicothoracic junction alignment is within normal limits. Bilateral posterior element alignment is within normal limits. Skull base and vertebrae: Osteopenia. Visualized skull base is intact. No atlanto-occipital dissociation. C1 and C2 appear intact and aligned. No acute osseous abnormality identified. Multilevel degenerative cervical spine ankylosis, including C7-T1 bilateral facet ankylosis. Soft tissues and spinal canal: No prevertebral fluid or swelling. No visible canal hematoma. Disc levels: Bulky degenerative partially calcified ligamentous hypertrophy about the odontoid. Degenerative subchondral cyst at the right base of the odontoid. Degenerative appearing ankylosis of  C2 through C5. Advanced disc degeneration with vacuum dist at the unfused C6-C7 level. C7-T1 facet and interbody ankylosis. Upper chest: Visible upper thoracic levels appear stable and grossly intact. Stable right upper lobe lung nodule on series 9, image 88, benign. Calcified aortic atherosclerosis. IMPRESSION: 1. No acute traumatic injury identified in the head or cervical spine. 2. Stable non contrast CT appearance of the brain since last year. 3. New bilateral middle ear and mastoid opacification with no associated skull base fracture identified. Query bilateral otitis media. 4. Multilevel cervical spine degeneration and  ankylosis. 5. Aortic Atherosclerosis (ICD10-I70.0). Electronically Signed   By: Genevie Ann M.D.   On: 01/31/2021 10:57   CT Cervical Spine Wo Contrast  Result Date: 01/31/2021 CLINICAL DATA:  87 year old female status post fall at home. EXAM: CT HEAD WITHOUT CONTRAST CT CERVICAL SPINE WITHOUT CONTRAST TECHNIQUE: Multidetector CT imaging of the head and cervical spine was performed following the standard protocol without intravenous contrast. Multiplanar CT image reconstructions of the cervical spine were also generated. COMPARISON:  Brain MRI 11/05/2005.  Head CT 02/29/2020. Thoracic spine MRI 03/10/2020. FINDINGS: CT HEAD FINDINGS Brain: Stable cerebral volume since last year. No midline shift, ventriculomegaly, mass effect, evidence of mass lesion, intracranial hemorrhage or evidence of cortically based acute infarction. Patchy and confluent bilateral white matter hypodensity appears stable. No cortical encephalomalacia identified. Vascular: Calcified atherosclerosis at the skull base. No suspicious intracranial vascular hyperdensity. Skull: No fracture identified. Sinuses/Orbits: Bilateral middle ear and mastoid opacification is new from last year. Negative visible nasopharynx. Paranasal sinuses remain well aerated. Other: No acute orbit or scalp soft tissue finding. CT CERVICAL SPINE FINDINGS Alignment: Preserved cervical lordosis. Cervicothoracic junction alignment is within normal limits. Bilateral posterior element alignment is within normal limits. Skull base and vertebrae: Osteopenia. Visualized skull base is intact. No atlanto-occipital dissociation. C1 and C2 appear intact and aligned. No acute osseous abnormality identified. Multilevel degenerative cervical spine ankylosis, including C7-T1 bilateral facet ankylosis. Soft tissues and spinal canal: No prevertebral fluid or swelling. No visible canal hematoma. Disc levels: Bulky degenerative partially calcified ligamentous hypertrophy about the odontoid.  Degenerative subchondral cyst at the right base of the odontoid. Degenerative appearing ankylosis of C2 through C5. Advanced disc degeneration with vacuum dist at the unfused C6-C7 level. C7-T1 facet and interbody ankylosis. Upper chest: Visible upper thoracic levels appear stable and grossly intact. Stable right upper lobe lung nodule on series 9, image 88, benign. Calcified aortic atherosclerosis. IMPRESSION: 1. No acute traumatic injury identified in the head or cervical spine. 2. Stable non contrast CT appearance of the brain since last year. 3. New bilateral middle ear and mastoid opacification with no associated skull base fracture identified. Query bilateral otitis media. 4. Multilevel cervical spine degeneration and ankylosis. 5. Aortic Atherosclerosis (ICD10-I70.0). Electronically Signed   By: Genevie Ann M.D.   On: 01/31/2021 10:57   Pelvis Portable  Result Date: 02/01/2021 CLINICAL DATA:  Postop. EXAM: PORTABLE PELVIS 1-2 VIEWS; RIGHT FEMUR PORTABLE 2 VIEW COMPARISON:  None. FINDINGS: AP view of the pelvis with AP and lateral views of the right femur. Intramedullary rod with trans trochanteric and distal locking screw fixation of intertrochanteric femur fracture. Decreased angulation of femur fracture from preoperative radiographs. No acute periprosthetic lucency. Recent postsurgical change includes air and edema in the soft tissues. Remote left pubic rami fractures. Left knee arthroplasty. IMPRESSION: Intramedullary rod with trans trochanteric and distal locking screw fixation of intertrochanteric right femur fracture, with improved alignment from preoperative radiographs. No immediate postoperative complication.  Electronically Signed   By: Keith Rake M.D.   On: 02/01/2021 19:01   DG Knee Right Port  Result Date: 01/31/2021 CLINICAL DATA:  Right hip fracture. EXAM: PORTABLE RIGHT KNEE - 1-2 VIEW COMPARISON:  None. FINDINGS: Right knee arthroplasty in expected alignment. No periprosthetic  lucency or fracture. There has been patellar resurfacing. No focal bone abnormality. No significant joint effusion. IMPRESSION: Right knee arthroplasty without complication or acute fracture. Electronically Signed   By: Keith Rake M.D.   On: 01/31/2021 21:38   DG C-Arm 1-60 Min  Result Date: 02/01/2021 CLINICAL DATA:  Right femur fraction fixation. EXAM: RIGHT FEMUR 2 VIEWS; DG C-ARM 1-60 MIN COMPARISON:  Preoperative radiographs yesterday. FINDINGS: Seven fluoroscopic spot views of the right femur obtained in the operating room. Intramedullary nail with trans trochanteric and distal locking screws traverse intertrochanteric femur fracture. Right knee arthroplasty is partially visualized. Total fluoroscopy time 1 minutes 32 seconds. Total dose 14.6 mGy. IMPRESSION: Intraoperative fluoroscopy during right femur fracture ORIF. Electronically Signed   By: Keith Rake M.D.   On: 02/01/2021 17:58   DG Hip Unilat W or Wo Pelvis 2-3 Views Right  Result Date: 01/31/2021 CLINICAL DATA:  87 year old female status post fall at home.  Pain. EXAM: DG HIP (WITH OR WITHOUT PELVIS) 2-3V RIGHT COMPARISON:  Hip series 02/29/2020. FINDINGS: Highly comminuted right femur intertrochanteric fracture with varus impaction. The right femoral head remains normally located. Extensive chronic bilateral pubic symphysis fractures appear stable. Grossly intact proximal left femur. No other acute osseous abnormality identified about the pelvis. Negative visible bowel gas pattern. IMPRESSION: 1. Highly comminuted right femur intertrochanteric fracture with varus impaction. 2. Chronic bilateral pubic symphysis fractures. Electronically Signed   By: Genevie Ann M.D.   On: 01/31/2021 10:44   DG FEMUR, MIN 2 VIEWS RIGHT  Result Date: 02/01/2021 CLINICAL DATA:  Right femur fraction fixation. EXAM: RIGHT FEMUR 2 VIEWS; DG C-ARM 1-60 MIN COMPARISON:  Preoperative radiographs yesterday. FINDINGS: Seven fluoroscopic spot views of the  right femur obtained in the operating room. Intramedullary nail with trans trochanteric and distal locking screws traverse intertrochanteric femur fracture. Right knee arthroplasty is partially visualized. Total fluoroscopy time 1 minutes 32 seconds. Total dose 14.6 mGy. IMPRESSION: Intraoperative fluoroscopy during right femur fracture ORIF. Electronically Signed   By: Keith Rake M.D.   On: 02/01/2021 17:58   DG FEMUR PORT, MIN 2 VIEWS RIGHT  Result Date: 02/01/2021 CLINICAL DATA:  Postop. EXAM: PORTABLE PELVIS 1-2 VIEWS; RIGHT FEMUR PORTABLE 2 VIEW COMPARISON:  None. FINDINGS: AP view of the pelvis with AP and lateral views of the right femur. Intramedullary rod with trans trochanteric and distal locking screw fixation of intertrochanteric femur fracture. Decreased angulation of femur fracture from preoperative radiographs. No acute periprosthetic lucency. Recent postsurgical change includes air and edema in the soft tissues. Remote left pubic rami fractures. Left knee arthroplasty. IMPRESSION: Intramedullary rod with trans trochanteric and distal locking screw fixation of intertrochanteric right femur fracture, with improved alignment from preoperative radiographs. No immediate postoperative complication. Electronically Signed   By: Keith Rake M.D.   On: 02/01/2021 19:01        Scheduled Meds: . atorvastatin  20 mg Oral QHS  . chlorhexidine  15 mL Mouth/Throat NOW  . diclofenac Sodium  2-4 g Topical QHS  . docusate sodium  100 mg Oral BID  . enoxaparin (LOVENOX) injection  40 mg Subcutaneous Q24H  . lisinopril  40 mg Oral Daily  . meloxicam  7.5 mg Oral  Daily  . mirtazapine  15 mg Oral QHS  . pantoprazole  40 mg Oral Daily  . PARoxetine  10 mg Oral QHS  . polyethylene glycol  17 g Oral Q lunch  . polyvinyl alcohol  1 drop Both Eyes QHS  . psyllium  1 packet Oral Daily  . senna-docusate  1 tablet Oral QHS   Continuous Infusions: . lactated ringers 10 mL/hr at 02/01/21 2359     Assessment & Plan:   Principal Problem:   Hip fracture ([place]) Active Problems:   Type II diabetes mellitus ([place])   Hyperlipidemia associated with type 2 diabetes mellitus ([place])   Hypertension associated with diabetes ([place])   Fall at home, initial encounter   Closed comminuted intertrochanteric fracture of proximal end of right femur ([place])   DNR (do not resuscitate)   Closed communicated intertrochanteric fracture of the proximal end of the right femur secondary to fall at home: Acute.  4/28-POD#1,s/p Intramedullary Rt Intertronchtric femur fx Ortho following Pain control Bowel regimen Ortho rec. Holding lovenox today until Hg stablizes, scd for now Ancef 2gm post op PT/OT consult today Likely needs SNF F/u with orth in 2 weeks after dc for repeat xray and wound ck.  Post op blood loss-2/2 surgry Hg 7.8 Continue to monitor Transfuse if Hg <7   Essential hypertension:  Stable Continue acei, and hydralazine prn  Hyperlipidemia -continue statin  History of hypothyroidism: Was on levothyroxine 25 MCG daily but has not taking it for some time  TSH normal Further monitoring can be done as outpatient by PCP     Diabetes mellitus type 2:  Apparently diet controlled, not on any medication  continue to monitor     Depression and anxiety -Continue Paxil and mirtazapine  Osteoporosis: Patient with prior history of T12 and T11 fractures back in 03/2020 after having a fall.        DVT prophylaxis: scd. Holding lovenox per ortho request Code Status: DNR Family Communication: daughter at bedside  Status is: Inpatient  Remains inpatient appropriate because:Inpatient level of care appropriate due to severity of illness   Dispo: The patient is from: Home              Anticipated d/c is to: SNF              Patient currently is not medically stable to d/c.   Difficult to place patient No            LOS: 2 days   Time spent: 35 minutes with  more than 50% on [place], MD [facility] Pager [phone]  If 7PM-7AM, please contact night-coverage 02/02/2021, 8:18 AM

## 2021-02-02 NOTE — Progress Notes (Signed)
Daughter requested that 0400 vital signs be skipped due to patient is asleep and has had a hard time going to sleep will continue to monitor. Arthor Captain LPN

## 2021-02-02 NOTE — Evaluation (Signed)
Physical Therapy Evaluation Patient Details Name: [name] MRN: [id number] DOB: 07/05/1923 Today's Date: 02/02/2021   History of Present Illness  87 yo female s/p cephalomedullary nailing of R intertrochanteric femur fracture on 4/27 after fall. CT head and neck negative for acute changes. PMH includes anxiety, depression, pelvic fracture, HTN, macular degeneration, osteoporosis, DMII, T12 fx 03/2020, R TKR 2018.  Clinical Impression   Pt presents with generalized weakness, R hip and LE pain, difficulty performing mobility tasks, impaired balance with history of falls, and decreased activity tolerance. Pt to benefit from acute PT to address deficits. Pt requiring max assist +1-2 for bed mobility and repeated transfers to standing, pt fatiguing quickly with multiple episodes of LE buckling. PT instructed pt in LE exercises to maintain strength and promote circulation, handout administered. PT to progress mobility as tolerated, and will continue to follow acutely.      Follow Up Recommendations SNF    Equipment Recommendations  None recommended by PT    Recommendations for Other Services       Precautions / Restrictions Precautions Precautions: Fall Restrictions Weight Bearing Restrictions: No RLE Weight Bearing: Weight bearing as tolerated      Mobility  Bed Mobility Overal bed mobility: Needs Assistance Bed Mobility: Supine to Sit;Sit to Supine     Supine to sit: Max assist;HOB elevated     General bed mobility comments: max assist for trunk and LE management, scooting to EOB.    Transfers Overall transfer level: Needs assistance Equipment used: Rolling walker (2 wheeled) Transfers: Sit to/from Stand Sit to Stand: Max assist;+2 safety/equipment         General transfer comment: max +2 for power up, rise, steady, and maintaining upright; pt with multiple bouts of LE buckling requiring seated rest. STS x3, from bed x2 and BSC x1. Pt placed in recliner with use of  bari stedy to practice for use for back to bed with RN staff.  Ambulation/Gait                Stairs            Wheelchair Mobility    Modified Rankin (Stroke Patients Only)       Balance Overall balance assessment: Needs assistance;History of Falls Sitting-balance support: No upper extremity supported;Feet supported Sitting balance-Leahy Scale: Fair     Standing balance support: Bilateral upper extremity supported;During functional activity Standing balance-Leahy Scale: Zero Standing balance comment: max assist to maintain upright                             Pertinent Vitals/Pain Pain Assessment: Faces Faces Pain Scale: Hurts even more Pain Location: R hip Pain Descriptors / Indicators: Sore;Discomfort;Grimacing Pain Intervention(s): Limited activity within patient's tolerance;Monitored during session;Repositioned    Home Living Family/patient expects to be discharged to:: Private residence Living Arrangements: Spouse/significant other (68 year old husband) Available Help at Discharge: Family Type of Home: House Home Access: Stairs to enter Entrance Stairs-Rails: Right Entrance Stairs-Number of Steps: 2 Home Layout: One level Home Equipment: Clinical cytogeneticist - 2 wheels;Cane - single point      Prior Function Level of Independence: Independent with assistive device(s)         Comments: pt reports use of RW for ambulation, per pt's daughter uses it inconsistently     Hand Dominance   Dominant Hand: Right    Extremity/Trunk Assessment   Upper Extremity Assessment Upper Extremity Assessment: Defer to OT evaluation  Lower Extremity Assessment Lower Extremity Assessment: Generalized weakness    Cervical / Trunk Assessment Cervical / Trunk Assessment: Kyphotic  Communication   Communication: HOH (hearing aide in L ear, still HOH)  Cognition Arousal/Alertness: Awake/alert Behavior During Therapy: WFL for tasks  assessed/performed Overall Cognitive Status: History of cognitive impairments - at baseline                                 General Comments: Per pt's daughter, pt with increased occurrances of sundowning, confusion. Pt pleasant, follows all mobility commands, and is motivated to progress mobility. A&Ox4.      General Comments General comments (skin integrity, edema, etc.): HRmax 123 bpm during standing tasks    Exercises General Exercises - Lower Extremity Ankle Circles/Pumps: AROM;Both;10 reps;Seated Quad Sets: AROM;Both;10 reps;Seated   Assessment/Plan    PT Assessment Patient needs continued PT services  PT Problem List Decreased strength;Decreased mobility;Decreased activity tolerance;Decreased balance;Decreased knowledge of use of DME;Pain;Decreased safety awareness;Cardiopulmonary status limiting activity       PT Treatment Interventions DME instruction;Therapeutic activities;Gait training;Therapeutic exercise;Patient/family education;Functional mobility training;Neuromuscular re-education;Balance training    PT Goals (Current goals can be found in the Care Plan section)  Acute Rehab PT Goals Patient Stated Goal: get better, walk again PT Goal Formulation: With patient Time For Goal Achievement: 02/16/21 Potential to Achieve Goals: Good    Frequency Min 3X/week   Barriers to discharge        Co-evaluation               AM-PAC PT "6 Clicks" Mobility  Outcome Measure Help needed turning from your back to your side while in a flat bed without using bedrails?: A Lot Help needed moving from lying on your back to sitting on the side of a flat bed without using bedrails?: A Lot Help needed moving to and from a bed to a chair (including a wheelchair)?: Total Help needed standing up from a chair using your arms (e.g., wheelchair or bedside chair)?: Total Help needed to walk in hospital room?: Total Help needed climbing 3-5 steps with a railing? :  Total 6 Click Score: 8    End of Session Equipment Utilized During Treatment: Gait belt Activity Tolerance: Patient tolerated treatment well;Patient limited by fatigue Patient left: in chair;with chair alarm set;with call bell/phone within reach;with family/visitor present Nurse Communication: Mobility status PT Visit Diagnosis: Other abnormalities of gait and mobility (R26.89);Difficulty in walking, not elsewhere classified (R26.2);Muscle weakness (generalized) (M62.81)    Time: 5400-8676 PT Time Calculation (min) (ACUTE ONLY): 41 min   Charges:   PT Evaluation $PT Eval Low Complexity: 1 Low PT Treatments $Therapeutic Activity: 23-37 mins        Stacie Glaze, PT DPT Acute Rehabilitation Services Pager [phone]  Office [phone]   Louis Matte 02/02/2021, 4:54 PM

## 2021-02-02 NOTE — Anesthesia Postprocedure Evaluation (Signed)
Anesthesia Post Note  Patient: [name]  Procedure(s) Performed: INTRAMEDULLARY (IM) NAIL INTERTROCHANTRIC (Right )     Patient location during evaluation: PACU Anesthesia Type: Spinal Level of consciousness: awake and alert Pain management: pain level controlled Vital Signs Assessment: post-procedure vital signs reviewed and stable Respiratory status: spontaneous breathing and respiratory function stable Cardiovascular status: blood pressure returned to baseline and stable Postop Assessment: spinal receding Anesthetic complications: no   No complications documented.  Last Vitals:  Vitals:   02/01/21 2356 02/02/21 1000  BP: (!) 143/60 136/60  Pulse:  68  Resp: 16 18  Temp:  36.8 C  SpO2: 94% 98%    Last Pain:  Vitals:   02/02/21 1507  TempSrc:   PainSc: 0-No pain                 Tiajuana Amass

## 2021-02-03 ENCOUNTER — Inpatient Hospital Stay (HOSPITAL_COMMUNITY): Payer: Medicare Other

## 2021-02-03 ENCOUNTER — Encounter (HOSPITAL_COMMUNITY): Payer: Self-pay | Admitting: Student

## 2021-02-03 LAB — CBC
HCT: 23.5 % — ABNORMAL LOW (ref 36.0–46.0)
Hemoglobin: 7.5 g/dL — ABNORMAL LOW (ref 12.0–15.0)
MCH: 32.9 pg (ref 26.0–34.0)
MCHC: 31.9 g/dL (ref 30.0–36.0)
MCV: 103.1 fL — ABNORMAL HIGH (ref 80.0–100.0)
Platelets: 221 10*3/uL (ref 150–400)
RBC: 2.28 MIL/uL — ABNORMAL LOW (ref 3.87–5.11)
RDW: 13.6 % (ref 11.5–15.5)
WBC: 7.7 10*3/uL (ref 4.0–10.5)
nRBC: 0 % (ref 0.0–0.2)

## 2021-02-03 LAB — BASIC METABOLIC PANEL
Anion gap: 8 (ref 5–15)
BUN: 35 mg/dL — ABNORMAL HIGH (ref 8–23)
CO2: 26 mmol/L (ref 22–32)
Calcium: 8.5 mg/dL — ABNORMAL LOW (ref 8.9–10.3)
Chloride: 100 mmol/L (ref 98–111)
Creatinine, Ser: 1.09 mg/dL — ABNORMAL HIGH (ref 0.44–1.00)
GFR, Estimated: 47 mL/min — ABNORMAL LOW (ref >60)
Glucose, Bld: 162 mg/dL — ABNORMAL HIGH (ref 70–99)
Potassium: 4.1 mmol/L (ref 3.5–5.1)
Sodium: 134 mmol/L — ABNORMAL LOW (ref 135–145)

## 2021-02-03 LAB — VITAMIN D 25 HYDROXY (VIT D DEFICIENCY, FRACTURES): Vit D, 25-Hydroxy: 19.92 ng/mL — ABNORMAL LOW (ref 30–100)

## 2021-02-03 MED ORDER — FERROUS GLUCONATE 324 (38 FE) MG PO TABS
324.0000 mg | ORAL_TABLET | Freq: Every day | ORAL | Status: DC
  Administered 2021-02-04 – 2021-02-08 (×5): 324 mg via ORAL
  Filled 2021-02-03 (×5): qty 1

## 2021-02-03 MED ORDER — DEXTROSE-NACL 5-0.45 % IV SOLN: INTRAVENOUS | Status: DC

## 2021-02-03 MED ORDER — ENSURE ENLIVE PO LIQD
237.0000 mL | Freq: Two times a day (BID) | ORAL | Status: DC
  Administered 2021-02-06 – 2021-02-08 (×4): 237 mL via ORAL

## 2021-02-03 MED ORDER — POLYETHYLENE GLYCOL 3350 17 G PO PACK
17.0000 g | PACK | Freq: Two times a day (BID) | ORAL | Status: DC
  Administered 2021-02-05 – 2021-02-07 (×5): 17 g via ORAL
  Filled 2021-02-03 (×8): qty 1

## 2021-02-03 MED ORDER — HYDROCODONE-ACETAMINOPHEN 5-325 MG PO TABS: 1.0000 | ORAL_TABLET | Freq: Four times a day (QID) | ORAL | 0 refills | Status: DC | PRN

## 2021-02-03 MED ORDER — ENOXAPARIN SODIUM 40 MG/0.4ML IJ SOSY: 40.0000 mg | PREFILLED_SYRINGE | INTRAMUSCULAR | 0 refills | Status: DC

## 2021-02-03 MED ORDER — VITAMIN D 25 MCG (1000 UNIT) PO TABS
1000.0000 [IU] | ORAL_TABLET | Freq: Every day | ORAL | Status: DC
  Administered 2021-02-03 – 2021-02-08 (×6): 1000 [IU] via ORAL
  Filled 2021-02-03 (×6): qty 1

## 2021-02-03 MED ORDER — VITAMIN D3 25 MCG PO TABS: 1000.0000 [IU] | ORAL_TABLET | Freq: Every day | ORAL | 0 refills | Status: DC

## 2021-02-03 MED ORDER — LORAZEPAM 2 MG/ML IJ SOLN
0.5000 mg | Freq: Every evening | INTRAMUSCULAR | Status: AC | PRN
  Administered 2021-02-03: 0.5 mg via INTRAVENOUS
  Filled 2021-02-03: qty 1

## 2021-02-03 NOTE — Plan of Care (Signed)
?  Problem: Activity: ?Goal: Risk for activity intolerance will decrease ?Outcome: Progressing ?  ?Problem: Safety: ?Goal: Ability to remain free from injury will improve ?Outcome: Progressing ?  ?Problem: Pain Managment: ?Goal: General experience of comfort will improve ?Outcome: Progressing ?  ?

## 2021-02-03 NOTE — Progress Notes (Signed)
Orthopaedic Trauma Progress Note  SUBJECTIVE: Doing okay this morning, just woke up.  Reports minimal to mild pain about operative site.  Was able to stand and transfer with therapies yesterday.  No chest pain. No SOB. No nausea/vomiting. No other complaints.  Daughter is at bedside.   OBJECTIVE:  Vitals:   02/02/21 2156 02/03/21 0752  BP: (!) 109/45 (!) 118/48  Pulse: 90 85  Resp: 17 17  Temp: 99.3 F (37.4 C) 97.7 F (36.5 C)  SpO2: 94% 94%    General: Laying in bed, no acute distress.  Respiratory: No increased work of breathing.  Right lower extremity: Dressings removed, incisions clean, dry, intact. No significant tenderness with palpation over the lateral hip or throughout the thigh.  Tolerates ankle dorsiflexion and plantarflexion.  Able to wiggle toes.  Endorses sensation to light touch distally.  2+ DP pulse  IMAGING: Stable post op imaging.   LABS:  Results for orders placed or performed during the hospital encounter of 01/31/21 (from the past 24 hour(s))  CBC     Status: Abnormal   Collection Time: 02/03/21 12:25 AM  Result Value Ref Range   WBC 7.7 4.0 - 10.5 K/uL   RBC 2.28 (L) 3.87 - 5.11 MIL/uL   Hemoglobin 7.5 (L) 12.0 - 15.0 g/dL   HCT 23.5 (L) 36.0 - 46.0 %   MCV 103.1 (H) 80.0 - 100.0 fL   MCH 32.9 26.0 - 34.0 pg   MCHC 31.9 30.0 - 36.0 g/dL   RDW 13.6 11.5 - 15.5 %   Platelets 221 150 - 400 K/uL   nRBC 0.0 0.0 - 0.2 %  Basic metabolic panel     Status: Abnormal   Collection Time: 02/03/21 12:25 AM  Result Value Ref Range   Sodium 134 (L) 135 - 145 mmol/L   Potassium 4.1 3.5 - 5.1 mmol/L   Chloride 100 98 - 111 mmol/L   CO2 26 22 - 32 mmol/L   Glucose, Bld 162 (H) 70 - 99 mg/dL   BUN 35 (H) 8 - 23 mg/dL   Creatinine, Ser 1.09 (H) 0.44 - 1.00 mg/dL   Calcium 8.5 (L) 8.9 - 10.3 mg/dL   GFR, Estimated 47 (L) >60 mL/min   Anion gap 8 5 - 15  VITAMIN D 25 Hydroxy (Vit-D Deficiency, Fractures)     Status: Abnormal   Collection Time: 02/03/21 12:25 AM   Result Value Ref Range   Vit D, 25-Hydroxy 19.92 (L) 30 - 100 ng/mL    ASSESSMENT: [name] is a 87 y.o. female, 2 Days Post-Op s/p INTRAMEDULLARY  NAIL RIGHT INTERTROCHANTRIC FEMUR FRACTURE  CV/Blood loss: Acute blood loss anemia, Hgb 7.5 this morning.   PLAN: Weightbearing: WBAT RLE Incisional and dressing care: Incisions may be left open to air Showering: Okay to begin showering 02/04/2021 if no drainage from incision Orthopedic device(s): None  Pain management:  1. Tylenol 500 mg q 6 hours PRN 2. Meloxicam 7.5 mg daily  3. Norco 5-325 mg q 6 hours PRN 4. Morphine 0.5 mg q 2 hours PRN VTE prophylaxis: Recommend holding Lovenox today until hemoglobin stabilizes, SCDs for now ID:  Ancef 2gm post op completed Foley/Lines:  No foley, KVO IVFs Impediments to Fracture Healing: Diabetes. Vitamin D level 19, will start on D3 supplementation 1000 units daily.  Dispo: Therapies as tolerated, PT/OT recommending SNF.  Continue to monitor CBC and start Lovenox once hemoglobin stable  Follow - up plan: 2 weeks after hospital discharge for repeat x-rays  and wound check  Contact information:  Katha Hamming MD, Patrecia Pace PA-C. After hours and holidays please check [email or web address] for group call information for [facility]   Vangie Henthorn A. Ricci Barker, PA-C [phone] (office) [email or web address]

## 2021-02-03 NOTE — TOC Initial Note (Signed)
Transition of Care [facility]) - Initial/Assessment Note    Patient Details  Name: [name] MRN: [id number] Date of Birth: Sep 23, 1923  Transition of [facility]) CM/SW Contact:    Milinda Antis, [place] Phone Number: 02/03/2021, 3:37 PM  Clinical Narrative:                 CSW received consult for possible SNF placement at time of discharge. CSW spoke with patient's daughter due to patient's fluctuting orientation.  Daughter  expressed understanding of PT recommendation and is agreeable to SNF placement at time of discharge.  Daughter  reports preference for a facility near [place] . CSW discussed insurance authorization process and provided Medicare SNF ratings list. Patient has received the COVID vaccines.  No further questions reported at this time.   Expected Discharge Plan: Skilled Nursing Facility Barriers to Discharge: Continued Medical Work up,Insurance Authorization,SNF Pending bed offer   Patient Goals and CMS Choice Patient states their goals for this hospitalization and ongoing recovery are:: SPoke with patient's daughter.  Patient disoriented at the time of encounter CMS Medicare.gov Compare Post Acute Care list provided to:: Patient Represenative (must comment) (daughter) Choice offered to / list presented to : Adult Children  Expected Discharge Plan and Services Expected Discharge Plan: [place]       Living arrangements for the past 2 months: Single Family Home                                      Prior Living Arrangements/Services Living arrangements for the past 2 months: Single Family Home Lives with:: Spouse Patient language and need for interpreter reviewed:: Yes Do you feel safe going back to the place where you live?: Yes      Need for Family Participation in Patient Care: Yes (Comment) Care giver support system in place?: Yes (comment)   Criminal Activity/Legal Involvement Pertinent to Current Situation/Hospitalization: No -  Comment as needed  Activities of Daily Living Home Assistive Devices/Equipment: Eyeglasses,Bedside commode/3-in-1,Walker (specify type) (four wheel walker) ADL Screening (condition at time of admission) Patient's cognitive ability adequate to safely complete daily activities?: Yes Is the patient deaf or have difficulty hearing?: Yes Does the patient have difficulty seeing, even when wearing glasses/contacts?: Yes Does the patient have difficulty concentrating, remembering, or making decisions?: Yes Patient able to express need for assistance with ADLs?: Yes Does the patient have difficulty dressing or bathing?: Yes Independently performs ADLs?: Yes (appropriate for developmental age) Does the patient have difficulty walking or climbing stairs?: Yes Weakness of Legs: Both Weakness of Arms/Hands: Both  Permission Sought/Granted   Permission granted to share information with : Yes, Verbal Permission Granted (granted from daughter)     Permission granted to share info w AGENCY: SNF        Emotional Assessment Appearance:: Other (Comment Required (assessment completed via phone)     Orientation: : Fluctuating Orientation (Suspected and/or reported Sundowners) Alcohol / Substance Use: Not Applicable Psych Involvement: No (comment)  Admission diagnosis:  Hip fracture ([place]) [S72.009A] Fall [W19.XXXA] Fall, initial encounter [id number]] Closed fracture of right hip, initial encounter [facility]) [S72.001A] Patient Active Problem List   Diagnosis Date Noted  . Hip fracture ([place]) 01/31/2021  . Fall at home, initial encounter 01/31/2021  . Closed comminuted intertrochanteric fracture of proximal end of right femur ([place]) 01/31/2021  . DNR (do not resuscitate) 01/31/2021  . Back pain 03/09/2020  .  Closed T12 fracture ([place]) 03/08/2020  . Type II diabetes mellitus ([place])   . Depression   . Hyperlipidemia associated with type 2 diabetes mellitus ([place])   . Hypothyroidism   . Hypertension  associated with diabetes ([place])   . Screening for viral disease 08/13/2019  . Heme positive stool 08/13/2019  . Anemia 08/13/2019  . Melena 08/13/2019  . S/P total knee replacement 04/22/2017   PCP:  Myrlene Broker, MD Pharmacy:   [facility] [address] [place] [address] Phone: [phone] Fax: [phone]     Social Determinants of Health (SDOH) Interventions    Readmission Risk Interventions Readmission Risk Prevention Plan 03/15/2020  Post Dischage Appt Complete  Medication Screening Complete  Transportation Screening Complete  Some recent data might be hidden

## 2021-02-03 NOTE — Discharge Instructions (Signed)
Orthopaedic Trauma Service Discharge Instructions   General Discharge Instructions  WEIGHT BEARING STATUS: Weightbearing as tolerated right lower extremity  RANGE OF MOTION/ACTIVITY: Okay for unrestricted range of motion of the hip and knee  Wound Care: Incisions can be left open to air if there is no drainage. If incision continues to have drainage, follow wound care instructions below. Okay to shower if no drainage from incisions.  DVT/PE prophylaxis: Lovenox daily x30 days  Diet: as you were eating previously.  Can use over the counter stool softeners and bowel preparations, such as Miralax, to help with bowel movements.  Narcotics can be constipating.  Be sure to drink plenty of fluids  PAIN MEDICATION USE AND EXPECTATIONS  You have likely been given narcotic medications to help control your pain.  After a traumatic event that results in an fracture (broken bone) with or without surgery, it is ok to use narcotic pain medications to help control one's pain.  We understand that everyone responds to pain differently and each individual patient will be evaluated on a regular basis for the continued need for narcotic medications. Ideally, narcotic medication use should last no more than 6-8 weeks (coinciding with fracture healing).   As a patient it is your responsibility as well to monitor narcotic medication use and report the amount and frequency you use these medications when you come to your office visit.   We would also advise that if you are using narcotic medications, you should take a dose prior to therapy to maximize you participation.  IF YOU ARE ON NARCOTIC MEDICATIONS IT IS NOT PERMISSIBLE TO OPERATE A MOTOR VEHICLE (MOTORCYCLE/CAR/TRUCK/MOPED) OR HEAVY MACHINERY DO NOT MIX NARCOTICS WITH OTHER CNS (CENTRAL NERVOUS SYSTEM) DEPRESSANTS SUCH AS ALCOHOL   STOP SMOKING OR USING NICOTINE PRODUCTS!!!!  As discussed nicotine severely impairs your body's ability to heal surgical and  traumatic wounds but also impairs bone healing.  Wounds and bone heal by forming microscopic blood vessels (angiogenesis) and nicotine is a vasoconstrictor (essentially, shrinks blood vessels).  Therefore, if vasoconstriction occurs to these microscopic blood vessels they essentially disappear and are unable to deliver necessary nutrients to the healing tissue.  This is one modifiable factor that you can do to dramatically increase your chances of healing your injury.    (This means no smoking, no nicotine gum, patches, etc)  DO NOT USE NONSTEROIDAL ANTI-INFLAMMATORY DRUGS (NSAID'S)  Using products such as Advil (ibuprofen), Aleve (naproxen), Motrin (ibuprofen) for additional pain control during fracture healing can delay and/or prevent the healing response.  If you would like to take over the counter (OTC) medication, Tylenol (acetaminophen) is ok.  However, some narcotic medications that are given for pain control contain acetaminophen as well. Therefore, you should not exceed more than 4000 mg of tylenol in a day if you do not have liver disease.  Also note that there are may OTC medicines, such as cold medicines and allergy medicines that my contain tylenol as well.  If you have any questions about medications and/or interactions please ask your doctor/PA or your pharmacist.      ICE AND ELEVATE INJURED/OPERATIVE EXTREMITY  Using ice and elevating the injured extremity above your heart can help with swelling and pain control.  Icing in a pulsatile fashion, such as 20 minutes on and 20 minutes off, can be followed.    Do not place ice directly on skin. Make sure there is a barrier between to skin and the ice pack.    Using frozen  items such as frozen peas works well as the conform nicely to the are that needs to be iced.  USE AN ACE WRAP OR TED HOSE FOR SWELLING CONTROL  In addition to icing and elevation, Ace wraps or TED hose are used to help limit and resolve swelling.  It is recommended to use  Ace wraps or TED hose until you are informed to stop.    When using Ace Wraps start the wrapping distally (farthest away from the body) and wrap proximally (closer to the body)   Example: If you had surgery on your leg or thing and you do not have a splint on, start the ace wrap at the toes and work your way up to the thigh        If you had surgery on your upper extremity and do not have a splint on, start the ace wrap at your fingers and work your way up to the upper arm  [place]: [phone]   VISIT OUR WEBSITE FOR ADDITIONAL INFORMATION: [email or web address]   Discharge Wound Care Instructions  Do NOT apply any ointments, solutions or lotions to pin sites or surgical wounds.  These prevent needed drainage and even though solutions like hydrogen peroxide kill bacteria, they also damage cells lining the pin sites that help fight infection.  Applying lotions or ointments can keep the wounds moist and can cause them to breakdown and open up as well. This can increase the risk for infection. When in doubt call the office.  If any drainage is noted, use one layer of adaptic, then gauze, Kerlix, and an ace wrap.  Once the incision is completely dry and without drainage, it may be left open to air out.  Showering may begin 36-48 hours later.  Cleaning gently with soap and water.  Traumatic wounds should be dressed daily as well.    One layer of adaptic, gauze, Kerlix, then ace wrap.  The adaptic can be discontinued once the draining has ceased    If you have a wet to dry dressing: wet the gauze with saline the squeeze as much saline out so the gauze is moist (not soaking wet), place moistened gauze over wound, then place a dry gauze over the moist one, followed by Kerlix wrap, then ace wrap.

## 2021-02-03 NOTE — Progress Notes (Signed)
Patient's daughter called nurse to the room.  Patient is currently NPO because she was unable to swallow during the day including water.    Speech and Language Pathologist recommended patient being NPO and further esphageal testing.  Patient is very restless right now.  Daughter is requesting something for sleep iv.    Will contact on call MD for an order.  Received order for Ativan 0.5 mg IV for sleep.

## 2021-02-03 NOTE — Progress Notes (Signed)
Patient is in bed. HOH elevated. cough up with moderate amount of clear phlegm. C/O SOB. MD notified. New order noted.  Sat 86 on RA. Started on oxygen at 2lpm Sat 91-96%. Family at bedside.

## 2021-02-03 NOTE — Progress Notes (Signed)
Physical Therapy Treatment Patient Details Name: [name] MRN: [id number] DOB: [birth year] Today's Date: 02/03/2021    History of Present Illness 87 yo female s/p cephalomedullary nailing of R intertrochanteric femur fracture on 4/27 after fall. CT head and neck negative for acute changes. PMH includes anxiety, depression, pelvic fracture, HTN, macular degeneration, osteoporosis, DMII, T12 fx 03/2020, R TKR 2018.    PT Comments    Pt very fatigued upon arrival to room, but agreeable to LE exercises with PT and daughter encouragement. Pt tolerated all LE exercises well, requires active-assist on several of the RLE exercises due to post-operative weakness and pain. Pt resting comfortably upon PT exit, exercise handout given to pt and daughter to perform exercises over the weekend. Will continue to follow acutely.   HR 100-104 bpm   Follow Up Recommendations  SNF     Equipment Recommendations  None recommended by PT    Recommendations for Other Services       Precautions / Restrictions Precautions Precautions: Fall Restrictions Weight Bearing Restrictions: No RLE Weight Bearing: Weight bearing as tolerated    Mobility  Bed Mobility               General bed mobility comments: exercises only given fatigue    Transfers                    Ambulation/Gait                 Stairs             Wheelchair Mobility    Modified Rankin (Stroke Patients Only)       Balance                                            Cognition Arousal/Alertness: Awake/alert Behavior During Therapy: WFL for tasks assessed/performed Overall Cognitive Status: History of cognitive impairments - at baseline                                 General Comments: pt fatigued but agreeable to mobility, A&O x4.      Exercises General Exercises - Lower Extremity Ankle Circles/Pumps: AROM;Both;15 reps;Supine Quad Sets: AROM;Both;10  reps;Supine Short Arc Quad: AAROM;Right;AROM;Left;10 reps;Supine Heel Slides: AAROM;Both;10 reps;Supine Hip ABduction/ADduction: AAROM;Both;10 reps;Supine    General Comments        Pertinent Vitals/Pain Pain Assessment: Faces Faces Pain Scale: Hurts little more Pain Location: chest, from "choking episode"; RLE Pain Descriptors / Indicators: Discomfort Pain Intervention(s): Limited activity within patient's tolerance;Monitored during session;Repositioned;Ice applied    Home Living                      Prior Function            PT Goals (current goals can now be found in the care plan section) Acute Rehab PT Goals Patient Stated Goal: get better, walk again PT Goal Formulation: With patient Time For Goal Achievement: 02/16/21 Potential to Achieve Goals: Good Progress towards PT goals: Progressing toward goals    Frequency    Min 3X/week      PT Plan Current plan remains appropriate    Co-evaluation              AM-PAC PT "6 Clicks" Mobility   Outcome Measure  Help needed turning from your back to your side while in a flat bed without using bedrails?: A Lot Help needed moving from lying on your back to sitting on the side of a flat bed without using bedrails?: A Lot Help needed moving to and from a bed to a chair (including a wheelchair)?: Total Help needed standing up from a chair using your arms (e.g., wheelchair or bedside chair)?: Total Help needed to walk in hospital room?: Total Help needed climbing 3-5 steps with a railing? : Total 6 Click Score: 8    End of Session   Activity Tolerance: Patient limited by fatigue Patient left: with call bell/phone within reach;with family/visitor present;in bed;with bed alarm set Nurse Communication: Mobility status PT Visit Diagnosis: Other abnormalities of gait and mobility (R26.89);Difficulty in walking, not elsewhere classified (R26.2);Muscle weakness (generalized) (M62.81)     Time: 3267-1245 PT  Time Calculation (min) (ACUTE ONLY): 18 min  Charges:  $Therapeutic Exercise: 8-22 mins                     Stacie Glaze, PT DPT Acute Rehabilitation Services Pager [phone]  Office [phone]    Roxine Caddy E Ruffin Pyo 02/03/2021, 5:11 PM

## 2021-02-03 NOTE — Progress Notes (Signed)
TRH night shift.  The nursing staff reported that the patient's daughter would like something IV to help him relax and sleep.  He is currently NPO.  He usually takes mirtazapine at bedtime to help him sleep.  Lorazepam 0.5 mg IVP x1 dose ordered.  Tennis Must, MD.

## 2021-02-03 NOTE — Evaluation (Signed)
Clinical/Bedside Swallow Evaluation Patient Details  Name: [name] MRN: [id number] Date of Birth: August 21, 1923  Today's Date: 02/03/2021 Time: SLP Start Time (ACUTE ONLY): 3710 SLP Stop Time (ACUTE ONLY): 6269 SLP Time Calculation (min) (ACUTE ONLY): 20 min  Past Medical History:  Past Medical History:  Diagnosis Date  . Anemia   . Anxiety   . Arthritis    "all over" (04/23/2017)  . Cataract 2015   bilateral cat. extr.  . Depression   . GERD (gastroesophageal reflux disease)   . Heart murmur   . High cholesterol   . History of pelvic fracture 03/2016  . Hypertension   . Macular degeneration of both eyes   . Migraine    "started when I was in the 9th grade; lasted a couple years; started back in my 73's; had one daily right before OR" (04/23/2017)  . Osteoporosis   . Type II diabetes mellitus ([place])    "took Metformin for awhile; made me sick; diabetes is diet controlled" (04/23/2017)   Past Surgical History:  Past Surgical History:  Procedure Laterality Date  . ABDOMINAL HYSTERECTOMY    . APPENDECTOMY    . BREAST CYST EXCISION Bilateral    "all benign"  . CARPAL TUNNEL RELEASE Right   . CATARACT EXTRACTION W/ INTRAOCULAR LENS  IMPLANT, BILATERAL Bilateral   . DILATION AND CURETTAGE OF UTERUS    . INTRAMEDULLARY (IM) NAIL INTERTROCHANTERIC Right 02/01/2021   Procedure: INTRAMEDULLARY (IM) NAIL INTERTROCHANTRIC;  Surgeon: Shona Needles, MD;  Location: [facility];  Service: Orthopedics;  Laterality: Right;  . JOINT REPLACEMENT    . KYPHOPLASTY    . LAPAROSCOPIC CHOLECYSTECTOMY    . TONSILLECTOMY AND ADENOIDECTOMY     as child  . TOTAL KNEE ARTHROPLASTY Right 04/22/2017  . TOTAL KNEE ARTHROPLASTY Right 04/22/2017   Procedure: TOTAL KNEE ARTHROPLASTY;  Surgeon: Vickey Huger, MD;  Location: [facility];  Service: Orthopedics;  Laterality: Right;   HPI:  87 yo female s/p cephalomedullary nailing of R intertrochanteric femur fracture on 4/27 after fall. CT head and neck negative for  acute changes. PMH includes GERD, anxiety, depression, pelvic fracture, HTN, macular degeneration, osteoporosis, DMII, T12 fx 03/2020, R TKR 2018. Pt's daughter describes a h/o esophageal issues but is not able to be more specific.   Assessment / Plan / Recommendation Clinical Impression  Pt presents with signs of what could be an esophageal issue, given reports of globus sensation and immediate regurgitation of even small amount of water provided. Per her daughter, she has also been regurgitating and expectorating her secretions throughout the day. Her oral motor exam is unremarkable and although she has multiple swallows, it is hard to give an accurate differential dx of oropharyngeal swallowing when pt is not able to take more than a small amount of water. Recommend that pt remain NPO for today and would consider ordering GI or at least a more dedicated esophageal assessment. Will continue to follow. SLP Visit Diagnosis: Dysphagia, unspecified (R13.10)    Aspiration Risk  Risk for inadequate nutrition/hydration;Moderate aspiration risk    Diet Recommendation NPO   Medication Administration: Via alternative means    Other  Recommendations Recommended Consults: Consider GI evaluation;Consider esophageal assessment Oral Care Recommendations: Oral care QID Other Recommendations: Have oral suction available   Follow up Recommendations  (tba)      Frequency and Duration min 2x/week  2 weeks       Prognosis Prognosis for Safe Diet Advancement: Good Barriers to Reach Goals: Other (Comment) (  suspect primary esophageal issue)      Swallow Study   General HPI: 87 yo female s/p cephalomedullary nailing of R intertrochanteric femur fracture on 4/27 after fall. CT head and neck negative for acute changes. PMH includes GERD, anxiety, depression, pelvic fracture, HTN, macular degeneration, osteoporosis, DMII, T12 fx 03/2020, R TKR 2018. Pt's daughter describes a h/o esophageal issues but is not able  to be more specific. Type of Study: Bedside Swallow Evaluation Previous Swallow Assessment: none in chart Diet Prior to this Study: NPO Temperature Spikes Noted: No Respiratory Status: Nasal cannula History of Recent Intubation: No Behavior/Cognition: Alert;Cooperative;Pleasant mood;Requires cueing;Other (Comment) (HOH) Oral Cavity Assessment: Within Functional Limits Oral Care Completed by SLP: No Oral Cavity - Dentition: Adequate natural dentition Vision: Functional for self-feeding Self-Feeding Abilities: Needs assist Patient Positioning: Upright in bed Baseline Vocal Quality: Normal Volitional Cough: Weak Volitional Swallow: Able to elicit    Oral/Motor/Sensory Function Overall Oral Motor/Sensory Function: Within functional limits   Ice Chips Ice chips: Not tested   Thin Liquid Thin Liquid: Impaired Presentation: Straw Pharyngeal  Phase Impairments: Multiple swallows;Other (comments) (immediate regurgitation)    Nectar Thick Nectar Thick Liquid: Not tested   Honey Thick Honey Thick Liquid: Not tested   Puree Puree: Not tested   Solid     Solid: Not tested      Osie Bond., M.A. [place] Pager [phone] Office [phone]  02/03/2021,4:10 PM

## 2021-02-03 NOTE — NC FL2 (Signed)
[place] LEVEL OF CARE SCREENING TOOL     IDENTIFICATION  Patient Name: [name] Birthdate: [birth year] Sex: female Admission Date (Current Location): 01/31/2021  [employer] [facility] [employer] [facility] [employer] and [place] Number:  Herbalist and Address:  [facility] [place], [place] [address], [place], [place] [address]      Provider Number: [id number]  Attending Physician Name and Address:  Nolberto Hanlon, MD  Relative Name and Phone Number:  [name] Daughter     [phone] Health Care POA    Current Level of Care: Hospital Recommended Level of Care: [place] Prior Approval Number:    Date Approved/Denied:   PASRR Number: [id number]  Discharge Plan: Home    Current Diagnoses: Patient Active Problem List   Diagnosis Date Noted  . Hip fracture ([place]) 01/31/2021  . Fall at home, initial encounter 01/31/2021  . Closed comminuted intertrochanteric fracture of proximal end of right femur ([place]) 01/31/2021  . DNR (do not resuscitate) 01/31/2021  . Back pain 03/09/2020  . Closed T12 fracture ([place]) 03/08/2020  . Type II diabetes mellitus ([place])   . Depression   . Hyperlipidemia associated with type 2 diabetes mellitus ([place])   . Hypothyroidism   . Hypertension associated with diabetes ([place])   . Screening for viral disease 08/13/2019  . Heme positive stool 08/13/2019  . Anemia 08/13/2019  . Melena 08/13/2019  . S/P total knee replacement 04/22/2017    Orientation RESPIRATION BLADDER Height & Weight     Self  Normal Continent Weight: 120 lb (54.4 kg) Height:  5\' 5"  (165.1 cm)  BEHAVIORAL SYMPTOMS/MOOD NEUROLOGICAL BOWEL NUTRITION STATUS      Continent Diet (see d/c summary)  AMBULATORY STATUS COMMUNICATION OF NEEDS Skin   Extensive Assist Verbally Surgical wounds                       Personal Care Assistance Level of Assistance  Bathing,Feeding,Dressing Bathing Assistance: Maximum assistance Feeding assistance: Limited  assistance Dressing Assistance: Maximum assistance     Functional Limitations Info  Sight,Hearing,Speech     Speech Info: Adequate    SPECIAL CARE FACTORS FREQUENCY  PT (By licensed PT),OT (By licensed OT)     PT Frequency: 5x/week OT Frequency: 5x/week            Contractures Contractures Info: Not present    Additional Factors Info  Code Status,Allergies Code Status Info: DNR Allergies Info: Sulfamethoxazole           Current Medications (02/03/2021):  This is the current hospital active medication list Current Facility-Administered Medications  Medication Dose Route Frequency Provider Last Rate Last Admin  . acetaminophen (TYLENOL) tablet 500 mg  500 mg Oral Q6H PRN Haddix, Thomasene Lot, MD      . atorvastatin (LIPITOR) tablet 20 mg  20 mg Oral QHS Haddix, Thomasene Lot, MD   20 mg at 02/02/21 2132  . cholecalciferol (VITAMIN D3) tablet 1,000 Units  1,000 Units Oral Daily Delray Alt, PA-C   1,000 Units at 02/03/21 1224  . dextrose 5 %-0.45 % sodium chloride infusion   Intravenous Continuous Nolberto Hanlon, MD 75 mL/hr at 02/03/21 1315 New Bag at 02/03/21 1315  . diclofenac Sodium (VOLTAREN) 1 % topical gel 2-4 g  2-4 g Topical QHS Haddix, Thomasene Lot, MD      . docusate sodium (COLACE) capsule 100 mg  100 mg Oral BID PRN Haddix, Thomasene Lot, MD      . docusate  sodium (COLACE) capsule 100 mg  100 mg Oral BID Haddix, Thomasene Lot, MD   100 mg at 02/03/21 0905  . hydrALAZINE (APRESOLINE) injection 5 mg  5 mg Intravenous Q4H PRN Haddix, Thomasene Lot, MD      . HYDROcodone-acetaminophen (NORCO/VICODIN) 5-325 MG per tablet 1-2 tablet  1-2 tablet Oral Q6H PRN Haddix, Thomasene Lot, MD   1 tablet at 02/03/21 0027  . lactated ringers infusion   Intravenous Continuous Haddix, Thomasene Lot, MD 10 mL/hr at 02/01/21 2359 New Bag at 02/01/21 2359  . lidocaine (LIDODERM) 5 % 1 patch  1 patch Transdermal Daily PRN Haddix, Thomasene Lot, MD      . lisinopril (ZESTRIL) tablet 40 mg  40 mg Oral Daily Haddix, Thomasene Lot, MD   40 mg  at 02/03/21 0906  . meloxicam (MOBIC) tablet 7.5 mg  7.5 mg Oral Daily Haddix, Thomasene Lot, MD   7.5 mg at 02/03/21 0906  . menthol-cetylpyridinium (CEPACOL) lozenge 3 mg  1 lozenge Oral PRN Haddix, Thomasene Lot, MD       Or  . phenol (CHLORASEPTIC) mouth spray 1 spray  1 spray Mouth/Throat PRN Haddix, Thomasene Lot, MD      . metoCLOPramide (REGLAN) tablet 5-10 mg  5-10 mg Oral Q8H PRN Haddix, Thomasene Lot, MD       Or  . metoCLOPramide (REGLAN) injection 5-10 mg  5-10 mg Intravenous Q8H PRN Haddix, Thomasene Lot, MD      . mirtazapine (REMERON SOL-TAB) disintegrating tablet 15 mg  15 mg Oral QHS Haddix, Thomasene Lot, MD   15 mg at 02/02/21 2133  . morphine 2 MG/ML injection 0.5 mg  0.5 mg Intravenous Q2H PRN Haddix, Thomasene Lot, MD      . ondansetron (ZOFRAN) tablet 4 mg  4 mg Oral Q6H PRN Haddix, Thomasene Lot, MD       Or  . ondansetron (ZOFRAN) injection 4 mg  4 mg Intravenous Q6H PRN Haddix, Thomasene Lot, MD      . pantoprazole (PROTONIX) EC tablet 40 mg  40 mg Oral Daily Haddix, Thomasene Lot, MD   40 mg at 02/03/21 0906  . PARoxetine (PAXIL) tablet 10 mg  10 mg Oral QHS Haddix, Thomasene Lot, MD   10 mg at 02/02/21 2133  . polyethylene glycol (MIRALAX / GLYCOLAX) packet 17 g  17 g Oral Q lunch Haddix, Thomasene Lot, MD   17 g at 02/03/21 1225  . polyvinyl alcohol (LIQUIFILM TEARS) 1.4 % ophthalmic solution 1 drop  1 drop Both Eyes QHS Haddix, Thomasene Lot, MD   1 drop at 02/02/21 2134  . psyllium (HYDROCIL/METAMUCIL) 1 packet  1 packet Oral Daily Haddix, Thomasene Lot, MD   1 packet at 02/03/21 [phone]  . senna-docusate (Senokot-S) tablet 1 tablet  1 tablet Oral QHS Haddix, Thomasene Lot, MD   1 tablet at 02/02/21 2132     Discharge Medications: Please see discharge summary for a list of discharge medications.  Relevant Imaging Results:  Relevant Lab Results:   Additional Information SS # [id number]  [name] F Zamariah Seaborn, LCSWA

## 2021-02-03 NOTE — Progress Notes (Signed)
PROGRESS NOTE    [name]  UXL:[id number] DOB: Apr 02, 1923 DOA: 01/31/2021 PCP: Myrlene Broker, MD    Brief Narrative:  [name] is a 87 y.o. female with medical history significant of HTN, HLD, DM type II, osteoporosis, anxiety, depression, hypothyroidism no longer on medication presents after having a fall at home with complaints of pain on the right buttock and leg.  Normally patient gets around with the use of a rolling walker and has been trying to open the front door when she fell.  She reported that the door was sticking him when she turned around to get back to her walker lost her balance landing on her right side.  She bumped her head, but denies any loss of consciousness.  Thereafter patient reported severe pain in her right leg and also her arm.  Her last fall was back in 03/2020, where she suffered fractures T11 and T12.  Her last bowel movement was yesterday.  She is currently not on any blood thinners.  Daughter notes that she had recently been started on Paxil and mirtazapine for issues with depression.  ED Course: Upon admission into the emergency department patient was seen to be afebrile with blood pressure 151/99-180 7/74 and all other vital signs maintained.  ET imaging of the head and cervical spine showed no acute abnormalities.  X-rays revealed a highly communicated right femur intertrochanteric fracture.  Orthopedics was consulted.  Patient had received 500 mL of normal saline IV fluids and pain medication.  TRH called to admit.   4/29- 02 dropped to high 80's, improved with 2 L to 90's. cxr revealed atelectasis.  Patient was encouraged to use incentive spirometer family at bedside verbalized understanding    Consultants:   Orthopedics  Procedures:   Antimicrobials:       Subjective: No abdominal pain no chest pain.  Does not report shortness of breath but spitting up sputum  Objective: Vitals:   02/02/21 1000 02/02/21 2120 02/02/21 2156 02/03/21  0752  BP: 136/60  (!) 109/45 (!) 118/48  Pulse: 68 88 90 85  Resp: 18 18 17 17   Temp: 98.2 F (36.8 C) 98.4 F (36.9 C) 99.3 F (37.4 C) 97.7 F (36.5 C)  TempSrc: Axillary Oral Oral Oral  SpO2: 98% 97% 94% 94%  Weight:      Height:       No intake or output data in the 24 hours ending 02/03/21 1038 Filed Weights   01/31/21 0916 02/01/21 1536  Weight: 54.4 kg 54.4 kg    Examination: NAD, calm nontachypneic CTA no wheeze rales rhonchi's rrr s1/s2 Soft benign +bs No edema Awake and alert Mood and affect appropriate in current setting    Data Reviewed: I have personally reviewed following labs and imaging studies  CBC: Recent Labs  Lab 01/31/21 0942 02/01/21 0308 02/01/21 1939 02/02/21 0458 02/03/21 0025  WBC 8.3 7.5 11.7* 8.3 7.7  HGB 12.1 9.4* 9.4* 7.8* 7.5*  HCT 39.0 30.5* 30.2* 24.4* 23.5*  MCV 104.3* 104.8* 104.1* 102.1* 103.1*  PLT 286 251 248 204 536   Basic Metabolic Panel: Recent Labs  Lab 01/31/21 0942 02/01/21 0308 02/02/21 0458 02/03/21 0025  NA 139 139 135 134*  K 4.2 4.9 4.5 4.1  CL 107 104 103 100  CO2 24 29 26 26   GLUCOSE 155* 138* 146* 162*  BUN 27* 28* 28* 35*  CREATININE 0.90 0.88 0.89 1.09*  CALCIUM 9.2 8.9 8.3* 8.5*   GFR: Estimated Creatinine Clearance: 26.5  mL/min (A) (by C-G formula based on SCr of 1.09 mg/dL (H)). Liver Function Tests: Recent Labs  Lab 01/31/21 0942  AST 24  ALT 19  ALKPHOS 57  BILITOT 0.8  PROT 6.5  ALBUMIN 3.5   No results for input(s): LIPASE, AMYLASE in the last 168 hours. No results for input(s): AMMONIA in the last 168 hours. Coagulation Profile: No results for input(s): INR, PROTIME in the last 168 hours. Cardiac Enzymes: No results for input(s): CKTOTAL, CKMB, CKMBINDEX, TROPONINI in the last 168 hours. BNP (last 3 results) No results for input(s): PROBNP in the last 8760 hours. HbA1C: No results for input(s): HGBA1C in the last 72 hours. CBG: Recent Labs  Lab 02/01/21 1732  GLUCAP  105*   Lipid Profile: No results for input(s): CHOL, HDL, LDLCALC, TRIG, CHOLHDL, LDLDIRECT in the last 72 hours. Thyroid Function Tests: Recent Labs    01/31/21 1555  TSH 1.720   Anemia Panel: No results for input(s): VITAMINB12, FOLATE, FERRITIN, TIBC, IRON, RETICCTPCT in the last 72 hours. Sepsis Labs: No results for input(s): PROCALCITON, LATICACIDVEN in the last 168 hours.  Recent Results (from the past 240 hour(s))  Resp Panel by RT-PCR (Flu A&B, Covid) Nasopharyngeal Swab     Status: None   Collection Time: 01/31/21 10:48 AM   Specimen: Nasopharyngeal Swab; Nasopharyngeal(NP) swabs in vial transport medium  Result Value Ref Range Status   SARS Coronavirus 2 by RT PCR NEGATIVE NEGATIVE Final    Comment: (NOTE) SARS-CoV-2 target nucleic acids are NOT DETECTED.  The SARS-CoV-2 RNA is generally detectable in upper respiratory specimens during the acute phase of infection. The lowest concentration of SARS-CoV-2 viral copies this assay can detect is 138 copies/mL. A negative result does not preclude SARS-Cov-2 infection and should not be used as the sole basis for treatment or other patient management decisions. A negative result may occur with  improper specimen collection/handling, submission of specimen other than nasopharyngeal swab, presence of viral mutation(s) within the areas targeted by this assay, and inadequate number of viral copies(<138 copies/mL). A negative result must be combined with clinical observations, patient history, and epidemiological information. The expected result is Negative.  Fact Sheet for Patients:  EntrepreneurPulse.com.au  Fact Sheet for Healthcare Providers:  IncredibleEmployment.be  This test is no t yet approved or cleared by the [place] FDA and  has been authorized for detection and/or diagnosis of SARS-CoV-2 by FDA under an Emergency Use Authorization (EUA). This EUA will remain  in effect  (meaning this test can be used) for the duration of the COVID-19 declaration under Section 564(b)(1) of the Act, 21 U.S.C.section 360bbb-3(b)(1), unless the authorization is terminated  or revoked sooner.       Influenza A by PCR NEGATIVE NEGATIVE Final   Influenza B by PCR NEGATIVE NEGATIVE Final    Comment: (NOTE) The Xpert Xpress SARS-CoV-2/FLU/RSV plus assay is intended as an aid in the diagnosis of influenza from Nasopharyngeal swab specimens and should not be used as a sole basis for treatment. Nasal washings and aspirates are unacceptable for Xpert Xpress SARS-CoV-2/FLU/RSV testing.  Fact Sheet for Patients: EntrepreneurPulse.com.au  Fact Sheet for Healthcare Providers: IncredibleEmployment.be  This test is not yet approved or cleared by the [place] FDA and has been authorized for detection and/or diagnosis of SARS-CoV-2 by FDA under an Emergency Use Authorization (EUA). This EUA will remain in effect (meaning this test can be used) for the duration of the COVID-19 declaration under Section 564(b)(1) of the Act, 21 U.S.C. section  360bbb-3(b)(1), unless the authorization is terminated or revoked.  Performed at [facility], [place] [address], [place], [place] [address]   Surgical PCR screen     Status: None   Collection Time: 01/31/21  4:03 PM   Specimen: Nasal Mucosa; Nasal Swab  Result Value Ref Range Status   MRSA, PCR NEGATIVE NEGATIVE Final   Staphylococcus aureus NEGATIVE NEGATIVE Final    Comment: (NOTE) The Xpert SA Assay (FDA approved for NASAL specimens in patients 26 years of age and older), is one component of a comprehensive surveillance program. It is not intended to diagnose infection nor to guide or monitor treatment. Performed at [facility], [place] [address] [place], [place] [address]          Radiology Studies: Pelvis Portable  Result Date: 02/01/2021 CLINICAL DATA:  Postop. EXAM:  PORTABLE PELVIS 1-2 VIEWS; RIGHT FEMUR PORTABLE 2 VIEW COMPARISON:  None. FINDINGS: AP view of the pelvis with AP and lateral views of the right femur. Intramedullary rod with trans trochanteric and distal locking screw fixation of intertrochanteric femur fracture. Decreased angulation of femur fracture from preoperative radiographs. No acute periprosthetic lucency. Recent postsurgical change includes air and edema in the soft tissues. Remote left pubic rami fractures. Left knee arthroplasty. IMPRESSION: Intramedullary rod with trans trochanteric and distal locking screw fixation of intertrochanteric right femur fracture, with improved alignment from preoperative radiographs. No immediate postoperative complication. Electronically Signed   By: Keith Rake M.D.   On: 02/01/2021 19:01   DG C-Arm 1-60 Min  Result Date: 02/01/2021 CLINICAL DATA:  Right femur fraction fixation. EXAM: RIGHT FEMUR 2 VIEWS; DG C-ARM 1-60 MIN COMPARISON:  Preoperative radiographs yesterday. FINDINGS: Seven fluoroscopic spot views of the right femur obtained in the operating room. Intramedullary nail with trans trochanteric and distal locking screws traverse intertrochanteric femur fracture. Right knee arthroplasty is partially visualized. Total fluoroscopy time 1 minutes 32 seconds. Total dose 14.6 mGy. IMPRESSION: Intraoperative fluoroscopy during right femur fracture ORIF. Electronically Signed   By: Keith Rake M.D.   On: 02/01/2021 17:58   DG FEMUR, MIN 2 VIEWS RIGHT  Result Date: 02/01/2021 CLINICAL DATA:  Right femur fraction fixation. EXAM: RIGHT FEMUR 2 VIEWS; DG C-ARM 1-60 MIN COMPARISON:  Preoperative radiographs yesterday. FINDINGS: Seven fluoroscopic spot views of the right femur obtained in the operating room. Intramedullary nail with trans trochanteric and distal locking screws traverse intertrochanteric femur fracture. Right knee arthroplasty is partially visualized. Total fluoroscopy time 1 minutes 32  seconds. Total dose 14.6 mGy. IMPRESSION: Intraoperative fluoroscopy during right femur fracture ORIF. Electronically Signed   By: Keith Rake M.D.   On: 02/01/2021 17:58   DG FEMUR PORT, MIN 2 VIEWS RIGHT  Result Date: 02/01/2021 CLINICAL DATA:  Postop. EXAM: PORTABLE PELVIS 1-2 VIEWS; RIGHT FEMUR PORTABLE 2 VIEW COMPARISON:  None. FINDINGS: AP view of the pelvis with AP and lateral views of the right femur. Intramedullary rod with trans trochanteric and distal locking screw fixation of intertrochanteric femur fracture. Decreased angulation of femur fracture from preoperative radiographs. No acute periprosthetic lucency. Recent postsurgical change includes air and edema in the soft tissues. Remote left pubic rami fractures. Left knee arthroplasty. IMPRESSION: Intramedullary rod with trans trochanteric and distal locking screw fixation of intertrochanteric right femur fracture, with improved alignment from preoperative radiographs. No immediate postoperative complication. Electronically Signed   By: Keith Rake M.D.   On: 02/01/2021 19:01        Scheduled Meds: . atorvastatin  20 mg  Oral QHS  . cholecalciferol  1,000 Units Oral Daily  . diclofenac Sodium  2-4 g Topical QHS  . docusate sodium  100 mg Oral BID  . lisinopril  40 mg Oral Daily  . meloxicam  7.5 mg Oral Daily  . mirtazapine  15 mg Oral QHS  . pantoprazole  40 mg Oral Daily  . PARoxetine  10 mg Oral QHS  . polyethylene glycol  17 g Oral Q lunch  . polyvinyl alcohol  1 drop Both Eyes QHS  . psyllium  1 packet Oral Daily  . senna-docusate  1 tablet Oral QHS   Continuous Infusions: . lactated ringers 10 mL/hr at 02/01/21 2359    Assessment & Plan:   Principal Problem:   Hip fracture ([place]) Active Problems:   Type II diabetes mellitus ([place])   Hyperlipidemia associated with type 2 diabetes mellitus ([place])   Hypertension associated with diabetes ([place])   Fall at home, initial encounter   Closed comminuted  intertrochanteric fracture of proximal end of right femur ([place])   DNR (do not resuscitate)   Closed communicated intertrochanteric fracture of the proximal end of the right femur secondary to fall at home: Acute.  4/28-POD#1,s/p Intramedullary Rt Intertronchtric femur fx Ortho following Pain control 4/29-bowel regimen  PT recommends SNF Incentive spirometer  WBAT right lower extremity  Ortho recommends holding Lovenox until hemoglobin stabilizes, SCD for now Follow-up 2 weeks after hospital discharge for repeat x-ray and wound check   Post op blood loss-2/2 surgry Hemoglobin 7.5 Continue to monitor Transfuse if hemoglobin less than 7 Resume patient's outpatient iron pills   Essential hypertension:  Stable, on low side. Will hold acei and hydralazine iv prn    Hyperlipidemia Continue statin  History of hypothyroidism: Was on levothyroxine 25 MCG daily but has not taking it for some time  TSH normal Further monitoring can be done as outpatient by PCP     Diabetes mellitus type 2:  Apparently diet controlled, not on any medication  continue to monitor     Depression and anxiety -Continue Paxil and mirtazapine  Osteoporosis: Patient with prior history of T12 and T11 fractures back in 03/2020 after having a fall.        DVT prophylaxis: scd. Holding lovenox per ortho request Code Status: DNR Family Communication: daughter at bedside  Status is: Inpatient  Remains inpatient appropriate because:Inpatient level of care appropriate due to severity of illness   Dispo: The patient is from: Home              Anticipated d/c is to: SNF              Patient currently is not medically stable to d/c.   Difficult to place patient No            LOS: 3 days   Time spent: 35 minutes with more than 50% on [place], MD [facility] Pager [phone]  If 7PM-7AM, please contact night-coverage 02/03/2021, 10:38 AM

## 2021-02-04 LAB — HEMOGLOBIN AND HEMATOCRIT, BLOOD
HCT: 26.7 % — ABNORMAL LOW (ref 36.0–46.0)
Hemoglobin: 8.6 g/dL — ABNORMAL LOW (ref 12.0–15.0)

## 2021-02-04 LAB — CBC
HCT: 21.9 % — ABNORMAL LOW (ref 36.0–46.0)
Hemoglobin: 6.9 g/dL — CL (ref 12.0–15.0)
MCH: 32.5 pg (ref 26.0–34.0)
MCHC: 31.5 g/dL (ref 30.0–36.0)
MCV: 103.3 fL — ABNORMAL HIGH (ref 80.0–100.0)
Platelets: 232 10*3/uL (ref 150–400)
RBC: 2.12 MIL/uL — ABNORMAL LOW (ref 3.87–5.11)
RDW: 13.4 % (ref 11.5–15.5)
WBC: 6.6 10*3/uL (ref 4.0–10.5)
nRBC: 0 % (ref 0.0–0.2)

## 2021-02-04 LAB — CREATININE, SERUM
Creatinine, Ser: 0.82 mg/dL (ref 0.44–1.00)
GFR, Estimated: >60 mL/min (ref >60)

## 2021-02-04 LAB — GLUCOSE, CAPILLARY
Glucose-Capillary: 109 mg/dL — ABNORMAL HIGH (ref 70–99)
Glucose-Capillary: 140 mg/dL — ABNORMAL HIGH (ref 70–99)
Glucose-Capillary: 167 mg/dL — ABNORMAL HIGH (ref 70–99)
Glucose-Capillary: 110 mg/dL — ABNORMAL HIGH (ref 70–99)

## 2021-02-04 LAB — PREPARE RBC (CROSSMATCH)

## 2021-02-04 MED ORDER — SODIUM CHLORIDE 0.9% IV SOLUTION: Freq: Once | INTRAVENOUS | Status: DC

## 2021-02-04 MED ORDER — LORAZEPAM 2 MG/ML IJ SOLN
0.5000 mg | Freq: Every evening | INTRAMUSCULAR | Status: AC | PRN
  Administered 2021-02-04: 0.5 mg via INTRAVENOUS
  Filled 2021-02-04: qty 1

## 2021-02-04 NOTE — Progress Notes (Signed)
Patient's daughter is requesting something for agitation.  Her mother received her evening medications but is still very restless.  Will contact the on-call MD.  Patient received order for Ativan 0.5 mg iv for sleep.

## 2021-02-04 NOTE — Progress Notes (Signed)
  Speech Language Pathology Treatment: Dysphagia  Patient Details Name: [name] MRN: [id number] DOB: 08-Feb-1923 Today's Date: 02/04/2021 Time: 9833-8250 SLP Time Calculation (min) (ACUTE ONLY): 18 min  Assessment / Plan / Recommendation Clinical Impression  ST follow up following clinical swallowing evaluation completed yesterday with regurgitation of all presented material.  Per RN GI has not been consulted as of yet.  The patient was presented with PO trials including thin liquids via spoon, and straw, pureed material and dry solids.  Patient no longer immediately regurgitating presented material.  Mastication of dry solids appeared adequate with no oral residue noted.  Swallow trigger appreciated to palpation and overt s/s of aspiration were not seen.  Per the patient's daughter that was present she likely has an esophageal dysphagia given reported symptoms of belching and material not always clearing easily.  She may benefit from GI work up to fully assess esophageal function.  Suggest a regular diet with thin liquids.  If issues with regurgitation occur patient may need to be made NPO again.  ST will follow up for therapeutic diet tolerance.     HPI HPI: 87 yo female s/p cephalomedullary nailing of R intertrochanteric femur fracture on 4/27 after fall. CT head and neck negative for acute changes. PMH includes GERD, anxiety, depression, pelvic fracture, HTN, macular degeneration, osteoporosis, DMII, T12 fx 03/2020, R TKR 2018. Pt's daughter describes a h/o esophageal issues but is not able to be more specific.      SLP Plan   Continued ST services during acute stay.  ST services post DC are to be determined.         Recommendations  Diet recommendations: Regular;Thin liquid Liquids provided via: Cup;Straw Medication Administration: Crushed with puree Supervision: Staff to assist with self feeding Compensations: Slow rate;Small sips/bites;Follow solids with liquid Postural Changes  and/or Swallow Maneuvers: Seated upright 90 degrees;Upright 30-60 min after meal                Oral Care Recommendations: Oral care BID Follow up Recommendations: Other (comment) (TBD) SLP Visit Diagnosis: Dysphagia, unspecified (R13.10)       [place], MA, CCC-SLP Acute Rehab SLP [phone]  [name] 02/04/2021, 10:08 AM

## 2021-02-04 NOTE — Progress Notes (Signed)
Critical lab value:  Lab called hgb is 6.9. Will contact on call MD.  Received orders to transfuse 1 unit PRBC's. Awaiting type and screen.

## 2021-02-04 NOTE — Progress Notes (Signed)
PROGRESS NOTE    [name]  KCL:[id number] DOB: Feb 24, 1923 DOA: 01/31/2021 PCP: Myrlene Broker, MD    Brief Narrative:  [name] is a 87 y.o. female with medical history significant of HTN, HLD, DM type II, osteoporosis, anxiety, depression, hypothyroidism no longer on medication presents after having a fall at home with complaints of pain on the right buttock and leg.  Normally patient gets around with the use of a rolling walker and has been trying to open the front door when she fell.  She reported that the door was sticking him when she turned around to get back to her walker lost her balance landing on her right side.  She bumped her head, but denies any loss of consciousness.  Thereafter patient reported severe pain in her right leg and also her arm.  Her last fall was back in 03/2020, where she suffered fractures T11 and T12.  Her last bowel movement was yesterday.  She is currently not on any blood thinners.  Daughter notes that she had recently been started on Paxil and mirtazapine for issues with depression.  ED Course: Upon admission into the emergency department patient was seen to be afebrile with blood pressure 151/99-180 7/74 and all other vital signs maintained.  ET imaging of the head and cervical spine showed no acute abnormalities.  X-rays revealed a highly communicated right femur intertrochanteric fracture.  Orthopedics was consulted.  Patient had received 500 mL of normal saline IV fluids and pain medication.  TRH called to admit.   4/29- 02 dropped to high 80's, improved with 2 L to 90's. cxr revealed atelectasis.  Patient was encouraged to use incentive spirometer family at bedside verbalized understanding  4/30-cxr with atelectasis. Reports feeling better, daughter agreeable. S/p 1 unit prbc , post tranfusion h/h stable.  Consultants:   Orthopedics  Procedures:   Antimicrobials:       Subjective: Denies shortness of breath, chest pain, abdominal  pain.  Overall pain control is much better dialysis  Objective: Vitals:   02/04/21 0747 02/04/21 0815 02/04/21 0844 02/04/21 1033  BP: (!) 114/48 136/83 (!) 146/59 (!) 123/54  Pulse: 86 86 82 88  Resp: 18 18 18 18   Temp: 97.7 F (36.5 C) 98.4 F (36.9 C) 99 F (37.2 C) 98.2 F (36.8 C)  TempSrc: Oral Oral Oral Oral  SpO2: 99% 98% 95% 93%  Weight:      Height:        Intake/Output Summary (Last 24 hours) at 02/04/2021 1422 Last data filed at 02/04/2021 1033 Gross per 24 hour  Intake 355.92 ml  Output 500 ml  Net -144.08 ml   Filed Weights   01/31/21 0916 02/01/21 1536  Weight: 54.4 kg 54.4 kg    Examination: NAD, calm CTA no wheeze rales rhonchi's Regular S1-S2 no gallops Soft benign positive bowel sounds No edema Awake and alert, grossly intact Mood and affect appropriate in current setting   Data Reviewed: I have personally reviewed following labs and imaging studies  CBC: Recent Labs  Lab 02/01/21 0308 02/01/21 1939 02/02/21 0458 02/03/21 0025 02/04/21 0244 02/04/21 1249  WBC 7.5 11.7* 8.3 7.7 6.6  --   HGB 9.4* 9.4* 7.8* 7.5* 6.9* 8.6*  HCT 30.5* 30.2* 24.4* 23.5* 21.9* 26.7*  MCV 104.8* 104.1* 102.1* 103.1* 103.3*  --   PLT 251 248 204 221 232  --    Basic Metabolic Panel: Recent Labs  Lab 01/31/21 0942 02/01/21 0308 02/02/21 0458 02/03/21 0025 02/04/21  1249  NA 139 139 135 134*  --   K 4.2 4.9 4.5 4.1  --   CL 107 104 103 100  --   CO2 24 29 26 26   --   GLUCOSE 155* 138* 146* 162*  --   BUN 27* 28* 28* 35*  --   CREATININE 0.90 0.88 0.89 1.09* 0.82  CALCIUM 9.2 8.9 8.3* 8.5*  --    GFR: Estimated Creatinine Clearance: 35.2 mL/min (by C-G formula based on SCr of 0.82 mg/dL). Liver Function Tests: Recent Labs  Lab 01/31/21 0942  AST 24  ALT 19  ALKPHOS 57  BILITOT 0.8  PROT 6.5  ALBUMIN 3.5   No results for input(s): LIPASE, AMYLASE in the last 168 hours. No results for input(s): AMMONIA in the last 168 hours. Coagulation  Profile: No results for input(s): INR, PROTIME in the last 168 hours. Cardiac Enzymes: No results for input(s): CKTOTAL, CKMB, CKMBINDEX, TROPONINI in the last 168 hours. BNP (last 3 results) No results for input(s): PROBNP in the last 8760 hours. HbA1C: No results for input(s): HGBA1C in the last 72 hours. CBG: Recent Labs  Lab 02/01/21 1732 02/04/21 0757 02/04/21 1351  GLUCAP 105* 109* 140*   Lipid Profile: No results for input(s): CHOL, HDL, LDLCALC, TRIG, CHOLHDL, LDLDIRECT in the last 72 hours. Thyroid Function Tests: No results for input(s): TSH, T4TOTAL, FREET4, T3FREE, THYROIDAB in the last 72 hours. Anemia Panel: No results for input(s): VITAMINB12, FOLATE, FERRITIN, TIBC, IRON, RETICCTPCT in the last 72 hours. Sepsis Labs: No results for input(s): PROCALCITON, LATICACIDVEN in the last 168 hours.  Recent Results (from the past 240 hour(s))  Resp Panel by RT-PCR (Flu A&B, Covid) Nasopharyngeal Swab     Status: None   Collection Time: 01/31/21 10:48 AM   Specimen: Nasopharyngeal Swab; Nasopharyngeal(NP) swabs in vial transport medium  Result Value Ref Range Status   SARS Coronavirus 2 by RT PCR NEGATIVE NEGATIVE Final    Comment: (NOTE) SARS-CoV-2 target nucleic acids are NOT DETECTED.  The SARS-CoV-2 RNA is generally detectable in upper respiratory specimens during the acute phase of infection. The lowest concentration of SARS-CoV-2 viral copies this assay can detect is 138 copies/mL. A negative result does not preclude SARS-Cov-2 infection and should not be used as the sole basis for treatment or other patient management decisions. A negative result may occur with  improper specimen collection/handling, submission of specimen other than nasopharyngeal swab, presence of viral mutation(s) within the areas targeted by this assay, and inadequate number of viral copies(<138 copies/mL). A negative result must be combined with clinical observations, patient history, and  epidemiological information. The expected result is Negative.  Fact Sheet for Patients:  EntrepreneurPulse.com.au  Fact Sheet for Healthcare Providers:  IncredibleEmployment.be  This test is no t yet approved or cleared by the [place] FDA and  has been authorized for detection and/or diagnosis of SARS-CoV-2 by FDA under an Emergency Use Authorization (EUA). This EUA will remain  in effect (meaning this test can be used) for the duration of the COVID-19 declaration under Section 564(b)(1) of the Act, 21 U.S.C.section 360bbb-3(b)(1), unless the authorization is terminated  or revoked sooner.       Influenza A by PCR NEGATIVE NEGATIVE Final   Influenza B by PCR NEGATIVE NEGATIVE Final    Comment: (NOTE) The Xpert Xpress SARS-CoV-2/FLU/RSV plus assay is intended as an aid in the diagnosis of influenza from Nasopharyngeal swab specimens and should not be used as a sole basis for treatment.  Nasal washings and aspirates are unacceptable for Xpert Xpress SARS-CoV-2/FLU/RSV testing.  Fact Sheet for Patients: EntrepreneurPulse.com.au  Fact Sheet for Healthcare Providers: IncredibleEmployment.be  This test is not yet approved or cleared by the [place] FDA and has been authorized for detection and/or diagnosis of SARS-CoV-2 by FDA under an Emergency Use Authorization (EUA). This EUA will remain in effect (meaning this test can be used) for the duration of the COVID-19 declaration under Section 564(b)(1) of the Act, 21 U.S.C. section 360bbb-3(b)(1), unless the authorization is terminated or revoked.  Performed at [facility], [place] [address], [place], [place] [address]   Surgical PCR screen     Status: None   Collection Time: 01/31/21  4:03 PM   Specimen: Nasal Mucosa; Nasal Swab  Result Value Ref Range Status   MRSA, PCR NEGATIVE NEGATIVE Final   Staphylococcus aureus NEGATIVE NEGATIVE Final     Comment: (NOTE) The Xpert SA Assay (FDA approved for NASAL specimens in patients 39 years of age and older), is one component of a comprehensive surveillance program. It is not intended to diagnose infection nor to guide or monitor treatment. Performed at [facility], [place] [address] [place], [place] [address]          Radiology Studies: DG CHEST PORT 1 VIEW  Result Date: 02/03/2021 CLINICAL DATA:  Hypoxia. EXAM: PORTABLE CHEST 1 VIEW COMPARISON:  January 31, 2021. FINDINGS: Stable cardiomediastinal silhouette. No pneumothorax or pleural effusion is noted. Hypoinflation of the lungs is noted with minimal bibasilar subsegmental atelectasis. Bony thorax is unremarkable. IMPRESSION: Hypoinflation of the lungs with minimal bibasilar subsegmental atelectasis. Electronically Signed   By: Marijo Conception M.D.   On: 02/03/2021 14:52        Scheduled Meds: . sodium chloride   Intravenous Once  . atorvastatin  20 mg Oral QHS  . cholecalciferol  1,000 Units Oral Daily  . diclofenac Sodium  2-4 g Topical QHS  . docusate sodium  100 mg Oral BID  . feeding supplement  237 mL Oral BID BM  . ferrous gluconate  324 mg Oral Q breakfast  . meloxicam  7.5 mg Oral Daily  . mirtazapine  15 mg Oral QHS  . pantoprazole  40 mg Oral Daily  . PARoxetine  10 mg Oral QHS  . polyethylene glycol  17 g Oral BID  . polyvinyl alcohol  1 drop Both Eyes QHS  . psyllium  1 packet Oral Daily  . senna-docusate  1 tablet Oral QHS   Continuous Infusions: . dextrose 5 % and 0.45% NaCl 50 mL/hr at 02/04/21 1258  . lactated ringers 10 mL/hr at 02/01/21 2359    Assessment & Plan:   Principal Problem:   Hip fracture [facility]) Active Problems:   Type II diabetes mellitus ([place])   Hyperlipidemia associated with type 2 diabetes mellitus ([place])   Hypertension associated with diabetes ([place])   Fall at home, initial encounter   Closed comminuted intertrochanteric fracture of proximal end of right femur ([place])    DNR (do not resuscitate)   Closed communicated intertrochanteric fracture of the proximal end of the right femur secondary to fall at home: Acute.  4/28-POD#1,s/p Intramedullary Rt Intertronchtric femur fx Ortho following Pain control PT recommends SNF Incentive spirometer  4/30-WBAT RLE  Ortho recommends holding Lovenox until hemoglobin stabilizes, SCD for now  Follow-Up with [place] after Hospital Discharge for Repeat X-Rays     Post op blood loss-2/2 surgry Hg 6.9, s/p 1 unit prbc Post  hg 8.6. Continue iron pills   Essential hypertension:  Stable  Continue holding ACEI and hydralazine IV as needed      Hyperlipidemia Continue statins  History of hypothyroidism: Was on levothyroxine 25 MCG daily but has not taking it for some time  TSH normal Further monitoring can be done as outpatient by PCP     Diabetes mellitus type 2:  Apparently diet controlled, not on any medication  continue to monitor  Currently on thin liquid diet/puree See SPL note. Will need gi evaluation for possilble dysphagia as outpt -discussed this with her daughter this am   Depression and anxiety -Continue Paxil and mirtazapine  Osteoporosis: Patient with prior history of T12 and T11 fractures back in 03/2020 after having a fall.        DVT prophylaxis: scd. Holding lovenox per ortho request Code Status: DNR Family Communication: daughter at bedside  Status is: Inpatient  Remains inpatient appropriate because:Inpatient level of care appropriate due to severity of illness   Dispo: The patient is from: Home              Anticipated d/c is to: SNF              Patient currently is not medically stable to d/c.   Difficult to place patient No            LOS: 4 days   Time spent: 35 minutes with more than 50% on [place], MD [facility] Pager [phone]  If 7PM-7AM, please contact night-coverage 02/04/2021, 2:22 PM

## 2021-02-05 LAB — GLUCOSE, CAPILLARY
Glucose-Capillary: 109 mg/dL — ABNORMAL HIGH (ref 70–99)
Glucose-Capillary: 127 mg/dL — ABNORMAL HIGH (ref 70–99)
Glucose-Capillary: 172 mg/dL — ABNORMAL HIGH (ref 70–99)

## 2021-02-05 LAB — TYPE AND SCREEN
ABO/RH(D): A POS
Antibody Screen: NEGATIVE
Unit division: 0

## 2021-02-05 LAB — BASIC METABOLIC PANEL
Anion gap: 6 (ref 5–15)
BUN: 24 mg/dL — ABNORMAL HIGH (ref 8–23)
CO2: 25 mmol/L (ref 22–32)
Calcium: 8.1 mg/dL — ABNORMAL LOW (ref 8.9–10.3)
Chloride: 106 mmol/L (ref 98–111)
Creatinine, Ser: 0.75 mg/dL (ref 0.44–1.00)
GFR, Estimated: >60 mL/min (ref >60)
Glucose, Bld: 105 mg/dL — ABNORMAL HIGH (ref 70–99)
Potassium: 4.1 mmol/L (ref 3.5–5.1)
Sodium: 137 mmol/L (ref 135–145)

## 2021-02-05 LAB — CBC
HCT: 26.9 % — ABNORMAL LOW (ref 36.0–46.0)
Hemoglobin: 8.7 g/dL — ABNORMAL LOW (ref 12.0–15.0)
MCH: 31.9 pg (ref 26.0–34.0)
MCHC: 32.3 g/dL (ref 30.0–36.0)
MCV: 98.5 fL (ref 80.0–100.0)
Platelets: 259 10*3/uL (ref 150–400)
RBC: 2.73 MIL/uL — ABNORMAL LOW (ref 3.87–5.11)
RDW: 15.3 % (ref 11.5–15.5)
WBC: 6.3 10*3/uL (ref 4.0–10.5)
nRBC: 0 % (ref 0.0–0.2)

## 2021-02-05 LAB — BPAM RBC
Blood Product Expiration Date: 202205252359
ISSUE DATE / TIME: 202204300756
Unit Type and Rh: 6200

## 2021-02-05 MED ORDER — ALPRAZOLAM 0.25 MG PO TABS
0.2500 mg | ORAL_TABLET | Freq: Once | ORAL | Status: AC
  Administered 2021-02-05: 0.25 mg via ORAL
  Filled 2021-02-05: qty 1

## 2021-02-05 MED ORDER — ENOXAPARIN SODIUM 40 MG/0.4ML IJ SOSY
40.0000 mg | PREFILLED_SYRINGE | INTRAMUSCULAR | Status: DC
  Administered 2021-02-05 – 2021-02-06 (×2): 40 mg via SUBCUTANEOUS
  Filled 2021-02-05 (×3): qty 0.4

## 2021-02-05 MED ORDER — QUETIAPINE FUMARATE 25 MG PO TABS
25.0000 mg | ORAL_TABLET | Freq: Every day | ORAL | Status: DC
  Administered 2021-02-05: 25 mg via ORAL
  Filled 2021-02-05: qty 1

## 2021-02-05 NOTE — Progress Notes (Signed)
PROGRESS NOTE    [name]  NGE:[id number] DOB: [birth year] DOA: 01/31/2021 PCP: Myrlene Broker, MD    Brief Narrative:  [name] is a 87 y.o. female with medical history significant of HTN, HLD, DM type II, osteoporosis, anxiety, depression, hypothyroidism no longer on medication presents after having a fall at home with complaints of pain on the right buttock and leg.  Normally patient gets around with the use of a rolling walker and has been trying to open the front door when she fell.  She reported that the door was sticking him when she turned around to get back to her walker lost her balance landing on her right side.  She bumped her head, but denies any loss of consciousness.  Thereafter patient reported severe pain in her right leg and also her arm.  Her last fall was back in 03/2020, where she suffered fractures T11 and T12.  Her last bowel movement was yesterday.  She is currently not on any blood thinners.  Daughter notes that she had recently been started on Paxil and mirtazapine for issues with depression.  ED Course: Upon admission into the emergency department patient was seen to be afebrile with blood pressure 151/99-180 7/74 and all other vital signs maintained.  ET imaging of the head and cervical spine showed no acute abnormalities.  X-rays revealed a highly communicated right femur intertrochanteric fracture.  Orthopedics was consulted.  Patient had received 500 mL of normal saline IV fluids and pain medication.  TRH called to admit.   4/29- 02 dropped to high 80's, improved with 2 L to 90's. cxr revealed atelectasis.  Patient was encouraged to use incentive spirometer family at bedside verbalized understanding  4/30-cxr with atelectasis. Reports feeling better, daughter agreeable. S/p 1 unit prbc , post tranfusion h/h stable. 5/1-overnight patient was agitated given Ativan.  Daughter this a.m. states she is agitated and requesting some Xanax.  Per daughter patient  did not sleep overnight.   Consultants:   Orthopedics  Procedures:   Antimicrobials:       Subjective: This am pt confused pleasant, calmer. Denies any sob, cp.  Objective: Vitals:   02/04/21 1548 02/04/21 2016 02/05/21 0421 02/05/21 0807  BP: (!) 120/54 (!) 119/51 (!) 152/70 (!) 144/70  Pulse: 87 95 90 96  Resp: 17   18  Temp: 98.6 F (37 C) 98 F (36.7 C) 97.7 F (36.5 C) 98 F (36.7 C)  TempSrc: Oral Oral Oral Oral  SpO2: 92% 92% 98% 96%  Weight:      Height:        Intake/Output Summary (Last 24 hours) at 02/05/2021 0933 Last data filed at 02/05/2021 0846 Gross per 24 hour  Intake 475.92 ml  Output 300 ml  Net 175.92 ml   Filed Weights   01/31/21 0916 02/01/21 1536  Weight: 54.4 kg 54.4 kg    Examination: Pleasantly confused, nad cta no w/r/r rrr s1s2 no gallop Soft benign +bs No edema Grossly intact Mood and affect appropriate   Data Reviewed: I have personally reviewed following labs and imaging studies  CBC: Recent Labs  Lab 02/01/21 1939 02/02/21 0458 02/03/21 0025 02/04/21 0244 02/04/21 1249 02/05/21 0122  WBC 11.7* 8.3 7.7 6.6  --  6.3  HGB 9.4* 7.8* 7.5* 6.9* 8.6* 8.7*  HCT 30.2* 24.4* 23.5* 21.9* 26.7* 26.9*  MCV 104.1* 102.1* 103.1* 103.3*  --  98.5  PLT 248 204 221 232  --  401   Basic Metabolic Panel:  Recent Labs  Lab 01/31/21 0942 02/01/21 0308 02/02/21 0458 02/03/21 0025 02/04/21 1249  NA 139 139 135 134*  --   K 4.2 4.9 4.5 4.1  --   CL 107 104 103 100  --   CO2 24 29 26 26   --   GLUCOSE 155* 138* 146* 162*  --   BUN 27* 28* 28* 35*  --   CREATININE 0.90 0.88 0.89 1.09* 0.82  CALCIUM 9.2 8.9 8.3* 8.5*  --    GFR: Estimated Creatinine Clearance: 35.2 mL/min (by C-G formula based on SCr of 0.82 mg/dL). Liver Function Tests: Recent Labs  Lab 01/31/21 0942  AST 24  ALT 19  ALKPHOS 57  BILITOT 0.8  PROT 6.5  ALBUMIN 3.5   No results for input(s): LIPASE, AMYLASE in the last 168 hours. No results for  input(s): AMMONIA in the last 168 hours. Coagulation Profile: No results for input(s): INR, PROTIME in the last 168 hours. Cardiac Enzymes: No results for input(s): CKTOTAL, CKMB, CKMBINDEX, TROPONINI in the last 168 hours. BNP (last 3 results) No results for input(s): PROBNP in the last 8760 hours. HbA1C: No results for input(s): HGBA1C in the last 72 hours. CBG: Recent Labs  Lab 02/04/21 1351 02/04/21 2016 02/04/21 2327 02/05/21 0638 02/05/21 0810  GLUCAP 140* 167* 110* 109* 127*   Lipid Profile: No results for input(s): CHOL, HDL, LDLCALC, TRIG, CHOLHDL, LDLDIRECT in the last 72 hours. Thyroid Function Tests: No results for input(s): TSH, T4TOTAL, FREET4, T3FREE, THYROIDAB in the last 72 hours. Anemia Panel: No results for input(s): VITAMINB12, FOLATE, FERRITIN, TIBC, IRON, RETICCTPCT in the last 72 hours. Sepsis Labs: No results for input(s): PROCALCITON, LATICACIDVEN in the last 168 hours.  Recent Results (from the past 240 hour(s))  Resp Panel by RT-PCR (Flu A&B, Covid) Nasopharyngeal Swab     Status: None   Collection Time: 01/31/21 10:48 AM   Specimen: Nasopharyngeal Swab; Nasopharyngeal(NP) swabs in vial transport medium  Result Value Ref Range Status   SARS Coronavirus 2 by RT PCR NEGATIVE NEGATIVE Final    Comment: (NOTE) SARS-CoV-2 target nucleic acids are NOT DETECTED.  The SARS-CoV-2 RNA is generally detectable in upper respiratory specimens during the acute phase of infection. The lowest concentration of SARS-CoV-2 viral copies this assay can detect is 138 copies/mL. A negative result does not preclude SARS-Cov-2 infection and should not be used as the sole basis for treatment or other patient management decisions. A negative result may occur with  improper specimen collection/handling, submission of specimen other than nasopharyngeal swab, presence of viral mutation(s) within the areas targeted by this assay, and inadequate number of viral copies(<138  copies/mL). A negative result must be combined with clinical observations, patient history, and epidemiological information. The expected result is Negative.  Fact Sheet for Patients:  EntrepreneurPulse.com.au  Fact Sheet for Healthcare Providers:  IncredibleEmployment.be  This test is no t yet approved or cleared by the [place] FDA and  has been authorized for detection and/or diagnosis of SARS-CoV-2 by FDA under an Emergency Use Authorization (EUA). This EUA will remain  in effect (meaning this test can be used) for the duration of the COVID-19 declaration under Section 564(b)(1) of the Act, 21 U.S.C.section 360bbb-3(b)(1), unless the authorization is terminated  or revoked sooner.       Influenza A by PCR NEGATIVE NEGATIVE Final   Influenza B by PCR NEGATIVE NEGATIVE Final    Comment: (NOTE) The Xpert Xpress SARS-CoV-2/FLU/RSV plus assay is intended as an aid in  the diagnosis of influenza from Nasopharyngeal swab specimens and should not be used as a sole basis for treatment. Nasal washings and aspirates are unacceptable for Xpert Xpress SARS-CoV-2/FLU/RSV testing.  Fact Sheet for Patients: EntrepreneurPulse.com.au  Fact Sheet for Healthcare Providers: IncredibleEmployment.be  This test is not yet approved or cleared by the [place] FDA and has been authorized for detection and/or diagnosis of SARS-CoV-2 by FDA under an Emergency Use Authorization (EUA). This EUA will remain in effect (meaning this test can be used) for the duration of the COVID-19 declaration under Section 564(b)(1) of the Act, 21 U.S.C. section 360bbb-3(b)(1), unless the authorization is terminated or revoked.  Performed at [facility], [place] [address], [place], [place] [address]   Surgical PCR screen     Status: None   Collection Time: 01/31/21  4:03 PM   Specimen: Nasal Mucosa; Nasal Swab  Result Value Ref  Range Status   MRSA, PCR NEGATIVE NEGATIVE Final   Staphylococcus aureus NEGATIVE NEGATIVE Final    Comment: (NOTE) The Xpert SA Assay (FDA approved for NASAL specimens in patients 86 years of age and older), is one component of a comprehensive surveillance program. It is not intended to diagnose infection nor to guide or monitor treatment. Performed at [facility], [place] [address] [place], [place] [address]          Radiology Studies: DG CHEST PORT 1 VIEW  Result Date: 02/03/2021 CLINICAL DATA:  Hypoxia. EXAM: PORTABLE CHEST 1 VIEW COMPARISON:  January 31, 2021. FINDINGS: Stable cardiomediastinal silhouette. No pneumothorax or pleural effusion is noted. Hypoinflation of the lungs is noted with minimal bibasilar subsegmental atelectasis. Bony thorax is unremarkable. IMPRESSION: Hypoinflation of the lungs with minimal bibasilar subsegmental atelectasis. Electronically Signed   By: Marijo Conception M.D.   On: 02/03/2021 14:52        Scheduled Meds: . sodium chloride   Intravenous Once  . atorvastatin  20 mg Oral QHS  . cholecalciferol  1,000 Units Oral Daily  . diclofenac Sodium  2-4 g Topical QHS  . docusate sodium  100 mg Oral BID  . feeding supplement  237 mL Oral BID BM  . ferrous gluconate  324 mg Oral Q breakfast  . meloxicam  7.5 mg Oral Daily  . mirtazapine  15 mg Oral QHS  . pantoprazole  40 mg Oral Daily  . PARoxetine  10 mg Oral QHS  . polyethylene glycol  17 g Oral BID  . polyvinyl alcohol  1 drop Both Eyes QHS  . psyllium  1 packet Oral Daily  . senna-docusate  1 tablet Oral QHS   Continuous Infusions: . dextrose 5 % and 0.45% NaCl 50 mL/hr at 02/04/21 1258  . lactated ringers 10 mL/hr at 02/01/21 2359    Assessment & Plan:   Principal Problem:   Hip fracture [facility]) Active Problems:   Type II diabetes mellitus ([place])   Hyperlipidemia associated with type 2 diabetes mellitus ([place])   Hypertension associated with diabetes ([place])   Fall at home,  initial encounter   Closed comminuted intertrochanteric fracture of proximal end of right femur ([place])   DNR (do not resuscitate)   Closed communicated intertrochanteric fracture of the proximal end of the right femur secondary to fall at home: Acute.  4/28-POD#1,s/p Intramedullary Rt Intertronchtric femur fx Ortho following Pain control PT recommends SNF Incentive spirometer  4/30-WBAT RLE  Ortho recommends holding Lovenox until hemoglobin stabilizes, SCD for now  Follow-Up with [place] after Hospital Discharge  for Repeat X-Rays 5/1-will give xanax x1 Start seroquel 25mg  qhs, discussed this with daughter who is a Marine scientist and agreeable. Will start lovenox since hg stable.     Post op blood loss-2/2 surgry Hg 6.9, s/p 1 unit prbc 5/1-hemoglobin remained stable at 8.7 Continue iron pills     Essential hypertension:  Normotensive Continue to hold ACE inhibitors and hydralazine IV as needed      Hyperlipidemia Continue statin  History of hypothyroidism: Was on levothyroxine 25 MCG daily but has not taking it for some time  TSH normal Further monitoring can be done as outpatient by PCP     Diabetes mellitus type 2:  Apparently diet controlled, not on any medication  continue to monitor  Currently on thin liquid diet/puree See SPL note. Will need gi evaluation for possilble dysphagia as outpt -discussed this with her daughter this am   Depression and anxiety -Continue Paxil and mirtazapine  Osteoporosis: Patient with prior history of T12 and T11 fractures back in 03/2020 after having a fall.        DVT prophylaxis: lovenox Code Status: DNR Family Communication: daughter at bedside  Status is: Inpatient  Remains inpatient appropriate because:Inpatient level of care appropriate due to severity of illness   Dispo: The patient is from: Home              Anticipated d/c is to: SNF              Patient currently is not medically stable to d/c.    Difficult to place patient No            LOS: 5 days   Time spent: 35 minutes with more than 50% on [place], MD [facility] Pager [phone]  If 7PM-7AM, please contact night-coverage 02/05/2021, 9:33 AM

## 2021-02-05 NOTE — Progress Notes (Signed)
IVT consult note: Spoke with pt's daughter, stated ,unit RN fixed IV."OK now". IVF infusing to R arm IV access via IV pump.

## 2021-02-06 LAB — URINALYSIS, ROUTINE W REFLEX MICROSCOPIC
Bilirubin Urine: NEGATIVE
Glucose, UA: NEGATIVE mg/dL
Hgb urine dipstick: NEGATIVE
Ketones, ur: NEGATIVE mg/dL
Leukocytes,Ua: NEGATIVE
Nitrite: NEGATIVE
Protein, ur: NEGATIVE mg/dL
Specific Gravity, Urine: 1.014 (ref 1.005–1.030)
pH: 5 (ref 5.0–8.0)

## 2021-02-06 LAB — CBC
HCT: 28.6 % — ABNORMAL LOW (ref 36.0–46.0)
Hemoglobin: 9.3 g/dL — ABNORMAL LOW (ref 12.0–15.0)
MCH: 32 pg (ref 26.0–34.0)
MCHC: 32.5 g/dL (ref 30.0–36.0)
MCV: 98.3 fL (ref 80.0–100.0)
Platelets: 292 10*3/uL (ref 150–400)
RBC: 2.91 MIL/uL — ABNORMAL LOW (ref 3.87–5.11)
RDW: 14.5 % (ref 11.5–15.5)
WBC: 6.2 10*3/uL (ref 4.0–10.5)
nRBC: 0 % (ref 0.0–0.2)

## 2021-02-06 MED ORDER — QUETIAPINE FUMARATE 25 MG PO TABS
50.0000 mg | ORAL_TABLET | Freq: Every day | ORAL | Status: DC
  Administered 2021-02-06 – 2021-02-07 (×2): 50 mg via ORAL
  Filled 2021-02-06 (×2): qty 2

## 2021-02-06 MED ORDER — HYDRALAZINE HCL 20 MG/ML IJ SOLN: 10.0000 mg | Freq: Three times a day (TID) | INTRAMUSCULAR | Status: DC | PRN

## 2021-02-06 NOTE — Progress Notes (Signed)
Physical Therapy Treatment Patient Details Name: [name] MRN: [id number] DOB: 06/05/1923 Today's Date: 02/06/2021    History of Present Illness 87 yo female s/p cephalomedullary nailing of R intertrochanteric femur fracture on 4/27 after fall. CT head and neck negative for acute changes. PMH includes anxiety, depression, pelvic fracture, HTN, macular degeneration, osteoporosis, DMII, T12 fx 03/2020, R TKR 2018.    PT Comments    Pt supine in bed with daughter present at bedside.  She has been sleeping most of the day as she has mixed up her sleep/wake cycles and per RN and daughter is "awake" all night.  Pt performed progression to edge of bed and into standing briefly with RW.  Pt continues to present with poor strength and need for increased assistance to mobilize.  Plan for SNF remains appropriate to maximize functional gains and decrease caregiver burden before returning home.  Educated daughter to encourage self feeding and exercises from HEP while in room.    Follow Up Recommendations  SNF     Equipment Recommendations  None recommended by PT    Recommendations for Other Services       Precautions / Restrictions Precautions Precautions: Fall Restrictions Weight Bearing Restrictions: Yes RLE Weight Bearing: Weight bearing as tolerated    Mobility  Bed Mobility Overal bed mobility: Needs Assistance Bed Mobility: Supine to Sit     Supine to sit: Mod assist;Max assist     General bed mobility comments: Mod assistance to move LEs to edge bed.  Once in sitting presents with listing to the L side.  Increased assistance to scoot to edge of bed (Max assist with bed pad).    Transfers Overall transfer level: Needs assistance Equipment used: Rolling walker (2 wheeled);None Transfers: Sit to/from [employer] Sit to Stand: Max assist;From elevated surface (RW used.)   Squat pivot transfers: Total assist (no device used.)     General transfer comment: Pt  performed sit to stand from elevated bed with cues for hand and foot placement and poort posture once rising into standing position.  Pt fatigues quickly and required return back to bed for squat pivot with face to face support.  Pt continues to improve slowly but was able to standing briefly in flexed position with RW.  Ambulation/Gait Ambulation/Gait assistance:  (Unable to progress steps to recliner given poor standing posture and tolerance.)               Stairs             Wheelchair Mobility    Modified Rankin (Stroke Patients Only)       Balance Overall balance assessment: Needs assistance;History of Falls Sitting-balance support: No upper extremity supported;Feet supported Sitting balance-Leahy Scale: Fair     Standing balance support: Bilateral upper extremity supported;During functional activity Standing balance-Leahy Scale: Zero                              Cognition Arousal/Alertness: Lethargic;Suspect due to medications (RN and daughter reports she is not sleeping at night and then wanting to sleep all day.) Behavior During Therapy: WFL for tasks assessed/performed Overall Cognitive Status: History of cognitive impairments - at baseline                                 General Comments: pt fatigued but agreeable to mobility, A&O x4.  Intermittent cues  to maintain focus on task as she was quick to close her eyes and required redirection.      Exercises General Exercises - Lower Extremity Long Arc Quad: AROM;Both;10 reps;Seated    General Comments        Pertinent Vitals/Pain Pain Assessment: No/denies pain Faces Pain Scale: Hurts little more Pain Location: R LE Pain Descriptors / Indicators: Discomfort Pain Intervention(s): Monitored during session;Repositioned    Home Living                      Prior Function            PT Goals (current goals can now be found in the care plan section) Acute Rehab PT  Goals Patient Stated Goal: get better, walk again Potential to Achieve Goals: Good Progress towards PT goals: Progressing toward goals    Frequency    Min 3X/week      PT Plan Current plan remains appropriate    Co-evaluation              AM-PAC PT "6 Clicks" Mobility   Outcome Measure  Help needed turning from your back to your side while in a flat bed without using bedrails?: A Lot Help needed moving from lying on your back to sitting on the side of a flat bed without using bedrails?: A Lot Help needed moving to and from a bed to a chair (including a wheelchair)?: Total Help needed standing up from a chair using your arms (e.g., wheelchair or bedside chair)?: A Lot Help needed to walk in hospital room?: Total Help needed climbing 3-5 steps with a railing? : Total 6 Click Score: 9    End of Session Equipment Utilized During Treatment: Gait belt Activity Tolerance: Patient limited by fatigue Patient left: with call bell/phone within reach;with family/visitor present;in chair;with chair alarm set Nurse Communication: Mobility status PT Visit Diagnosis: Other abnormalities of gait and mobility (R26.89);Difficulty in walking, not elsewhere classified (R26.2);Muscle weakness (generalized) (M62.81)     Time: 5009-3818 PT Time Calculation (min) (ACUTE ONLY): 18 min  Charges:  $Therapeutic Activity: 8-22 mins                     Erasmo Leventhal , PTA Acute Rehabilitation Services Pager [phone] Office [phone]     Marleah Beever Eli Hose 02/06/2021, 12:59 PM

## 2021-02-06 NOTE — Progress Notes (Signed)
PROGRESS NOTE    [name]  ONG:[id number] DOB: 1923-06-02 DOA: 01/31/2021 PCP: Myrlene Broker, MD    Brief Narrative:  [name] is a 87 y.o. female with medical history significant of HTN, HLD, DM type II, osteoporosis, anxiety, depression, hypothyroidism no longer on medication presents after having a fall at home with complaints of pain on the right buttock and leg.  Normally patient gets around with the use of a rolling walker and has been trying to open the front door when she fell.  She reported that the door was sticking him when she turned around to get back to her walker lost her balance landing on her right side.  She bumped her head, but denies any loss of consciousness.  Thereafter patient reported severe pain in her right leg and also her arm.  Her last fall was back in 03/2020, where she suffered fractures T11 and T12.  Her last bowel movement was yesterday.  She is currently not on any blood thinners.  Daughter notes that she had recently been started on Paxil and mirtazapine for issues with depression.  ED Course: Upon admission into the emergency department patient was seen to be afebrile with blood pressure 151/99-180 7/74 and all other vital signs maintained.  ET imaging of the head and cervical spine showed no acute abnormalities.  X-rays revealed a highly communicated right femur intertrochanteric fracture.  Orthopedics was consulted.  Patient had received 500 mL of normal saline IV fluids and pain medication.  TRH called to admit.   4/29- 02 dropped to high 80's, improved with 2 L to 90's. cxr revealed atelectasis.  Patient was encouraged to use incentive spirometer family at bedside verbalized understanding  4/30-cxr with atelectasis. Reports feeling better, daughter agreeable. S/p 1 unit prbc , post tranfusion h/h stable. 5/1-overnight patient was agitated given Ativan.  Daughter this a.m. states she is agitated and requesting some Xanax.  Per daughter patient  did not sleep overnight. 5/2-went to sleep  at 4 AM today.  No agitation reported . Per daughter she ate some breakfast. No choking was reported  Consultants:   Orthopedics  Procedures:   Antimicrobials:       Subjective: Pt has no complaints. Sleepy this am.   Objective: Vitals:   02/05/21 0421 02/05/21 0807 02/05/21 2051 02/06/21 0732  BP: (!) 152/70 (!) 144/70 (!) 153/71 (!) 150/87  Pulse: 90 96 92 93  Resp:  18 16 17   Temp: 97.7 F (36.5 C) 98 F (36.7 C) 98.7 F (37.1 C) 97.7 F (36.5 C)  TempSrc: Oral Oral Oral Oral  SpO2: 98% 96% 94% 96%  Weight:      Height:        Intake/Output Summary (Last 24 hours) at 02/06/2021 0848 Last data filed at 02/05/2021 1344 Gross per 24 hour  Intake 240 ml  Output --  Net 240 ml   Filed Weights   01/31/21 0916 02/01/21 1536  Weight: 54.4 kg 54.4 kg    Examination: NAD, calm CTA no wheeze rales rhonchi's Regular S1-S2 Soft benign positive bowel sounds SCD in place Grossly intact   Data Reviewed: I have personally reviewed following labs and imaging studies  CBC: Recent Labs  Lab 02/02/21 0458 02/03/21 0025 02/04/21 0244 02/04/21 1249 02/05/21 0122 02/06/21 0426  WBC 8.3 7.7 6.6  --  6.3 6.2  HGB 7.8* 7.5* 6.9* 8.6* 8.7* 9.3*  HCT 24.4* 23.5* 21.9* 26.7* 26.9* 28.6*  MCV 102.1* 103.1* 103.3*  --  98.5 98.3  PLT 204 221 232  --  259 [id number]   Basic Metabolic Panel: Recent Labs  Lab 01/31/21 0942 02/01/21 0308 02/02/21 0458 02/03/21 0025 02/04/21 1249 02/05/21 1318  NA 139 139 135 134*  --  137  K 4.2 4.9 4.5 4.1  --  4.1  CL 107 104 103 100  --  106  CO2 24 29 26 26   --  25  GLUCOSE 155* 138* 146* 162*  --  105*  BUN 27* 28* 28* 35*  --  24*  CREATININE 0.90 0.88 0.89 1.09* 0.82 0.75  CALCIUM 9.2 8.9 8.3* 8.5*  --  8.1*   GFR: Estimated Creatinine Clearance: 36.1 mL/min (by C-G formula based on SCr of 0.75 mg/dL). Liver Function Tests: Recent Labs  Lab 01/31/21 0942  AST 24  ALT 19  ALKPHOS  57  BILITOT 0.8  PROT 6.5  ALBUMIN 3.5   No results for input(s): LIPASE, AMYLASE in the last 168 hours. No results for input(s): AMMONIA in the last 168 hours. Coagulation Profile: No results for input(s): INR, PROTIME in the last 168 hours. Cardiac Enzymes: No results for input(s): CKTOTAL, CKMB, CKMBINDEX, TROPONINI in the last 168 hours. BNP (last 3 results) No results for input(s): PROBNP in the last 8760 hours. HbA1C: No results for input(s): HGBA1C in the last 72 hours. CBG: Recent Labs  Lab 02/04/21 2016 02/04/21 2327 02/05/21 0638 02/05/21 0810 02/05/21 1433  GLUCAP 167* 110* 109* 127* 172*   Lipid Profile: No results for input(s): CHOL, HDL, LDLCALC, TRIG, CHOLHDL, LDLDIRECT in the last 72 hours. Thyroid Function Tests: No results for input(s): TSH, T4TOTAL, FREET4, T3FREE, THYROIDAB in the last 72 hours. Anemia Panel: No results for input(s): VITAMINB12, FOLATE, FERRITIN, TIBC, IRON, RETICCTPCT in the last 72 hours. Sepsis Labs: No results for input(s): PROCALCITON, LATICACIDVEN in the last 168 hours.  Recent Results (from the past 240 hour(s))  Resp Panel by RT-PCR (Flu A&B, Covid) Nasopharyngeal Swab     Status: None   Collection Time: 01/31/21 10:48 AM   Specimen: Nasopharyngeal Swab; Nasopharyngeal(NP) swabs in vial transport medium  Result Value Ref Range Status   SARS Coronavirus 2 by RT PCR NEGATIVE NEGATIVE Final    Comment: (NOTE) SARS-CoV-2 target nucleic acids are NOT DETECTED.  The SARS-CoV-2 RNA is generally detectable in upper respiratory specimens during the acute phase of infection. The lowest concentration of SARS-CoV-2 viral copies this assay can detect is 138 copies/mL. A negative result does not preclude SARS-Cov-2 infection and should not be used as the sole basis for treatment or other patient management decisions. A negative result may occur with  improper specimen collection/handling, submission of specimen other than nasopharyngeal  swab, presence of viral mutation(s) within the areas targeted by this assay, and inadequate number of viral copies(<138 copies/mL). A negative result must be combined with clinical observations, patient history, and epidemiological information. The expected result is Negative.  Fact Sheet for Patients:  EntrepreneurPulse.com.au  Fact Sheet for Healthcare Providers:  IncredibleEmployment.be  This test is no t yet approved or cleared by the [place] FDA and  has been authorized for detection and/or diagnosis of SARS-CoV-2 by FDA under an Emergency Use Authorization (EUA). This EUA will remain  in effect (meaning this test can be used) for the duration of the COVID-19 declaration under Section 564(b)(1) of the Act, 21 U.S.C.section 360bbb-3(b)(1), unless the authorization is terminated  or revoked sooner.       Influenza A by PCR NEGATIVE NEGATIVE  Final   Influenza B by PCR NEGATIVE NEGATIVE Final    Comment: (NOTE) The Xpert Xpress SARS-CoV-2/FLU/RSV plus assay is intended as an aid in the diagnosis of influenza from Nasopharyngeal swab specimens and should not be used as a sole basis for treatment. Nasal washings and aspirates are unacceptable for Xpert Xpress SARS-CoV-2/FLU/RSV testing.  Fact Sheet for Patients: EntrepreneurPulse.com.au  Fact Sheet for Healthcare Providers: IncredibleEmployment.be  This test is not yet approved or cleared by the [place] FDA and has been authorized for detection and/or diagnosis of SARS-CoV-2 by FDA under an Emergency Use Authorization (EUA). This EUA will remain in effect (meaning this test can be used) for the duration of the COVID-19 declaration under Section 564(b)(1) of the Act, 21 U.S.C. section 360bbb-3(b)(1), unless the authorization is terminated or revoked.  Performed at [facility], [place] [address] [place], [place] [address]   Surgical PCR  screen     Status: None   Collection Time: 01/31/21  4:03 PM   Specimen: Nasal Mucosa; Nasal Swab  Result Value Ref Range Status   MRSA, PCR NEGATIVE NEGATIVE Final   Staphylococcus aureus NEGATIVE NEGATIVE Final    Comment: (NOTE) The Xpert SA Assay (FDA approved for NASAL specimens in patients 66 years of age and older), is one component of a comprehensive surveillance program. It is not intended to diagnose infection nor to guide or monitor treatment. Performed at [facility], [place] [address], [place], [place] [address]          Radiology Studies: No results found.      Scheduled Meds: . sodium chloride   Intravenous Once  . atorvastatin  20 mg Oral QHS  . cholecalciferol  1,000 Units Oral Daily  . diclofenac Sodium  2-4 g Topical QHS  . docusate sodium  100 mg Oral BID  . enoxaparin (LOVENOX) injection  40 mg Subcutaneous Q24H  . feeding supplement  237 mL Oral BID BM  . ferrous gluconate  324 mg Oral Q breakfast  . meloxicam  7.5 mg Oral Daily  . mirtazapine  15 mg Oral QHS  . pantoprazole  40 mg Oral Daily  . PARoxetine  10 mg Oral QHS  . polyethylene glycol  17 g Oral BID  . polyvinyl alcohol  1 drop Both Eyes QHS  . psyllium  1 packet Oral Daily  . QUEtiapine  25 mg Oral QHS  . senna-docusate  1 tablet Oral QHS   Continuous Infusions: . dextrose 5 % and 0.45% NaCl 50 mL/hr at 02/04/21 1258  . lactated ringers 10 mL/hr at 02/01/21 2359    Assessment & Plan:   Principal Problem:   Hip fracture [facility]) Active Problems:   Type II diabetes mellitus ([place])   Hyperlipidemia associated with type 2 diabetes mellitus ([place])   Hypertension associated with diabetes ([place])   Fall at home, initial encounter   Closed comminuted intertrochanteric fracture of proximal end of right femur ([place])   DNR (do not resuscitate)   Closed communicated intertrochanteric fracture of the proximal end of the right femur secondary to fall at home: Acute.  4/28-POD#1,s/p  Intramedullary Rt Intertronchtric femur fx Ortho following Pain control PT recommends SNF Incentive spirometer  4/30-WBAT RLE  Ortho recommends holding Lovenox until hemoglobin stabilizes, SCD for now  Follow-Up with [place] after Hospital Discharge for Repeat X-Rays 5/2-on Lovenox for DVT prophylaxis and hemoglobin remained stable  Seroquel will be increased to 50 mg nightly for sleep /aggitation Follow-up with Ortho after  hospital discharge in 2 weeks for repeat x-ray and wound check    Post op blood loss-2/2 surgry Hg 6.9, s/p 1 unit prbc 5/2 hemoglobin remained stable  Continue iron pills       Essential hypertension:  Overall stable Continue to hold ACE inhibitos .  IV hydralazine as needed    Hyperlipidemia Continue statins  History of hypothyroidism: Was on levothyroxine 25 MCG daily but has not taking it for some time  TSH normal Further monitoring can be done as outpatient by PCP     Diabetes mellitus type 2:  Apparently diet controlled, not on any medication  continue to monitor  Currently on thin liquid diet/puree See SPL note. Will need gi evaluation for possilble dysphagia as outpt -discussed this with her daughter this am   Depression and anxiety -Continue Paxil and mirtazapine  Osteoporosis: Patient with prior history of T12 and T11 fractures back in 03/2020 after having a fall.        DVT prophylaxis: lovenox Code Status: DNR Family Communication: daughter at bedside  Status is: Inpatient  Remains inpatient appropriate because:Inpatient level of care appropriate due to severity of illness   Dispo: The patient is from: Home              Anticipated d/c is to: SNF              Patient currently is not medically stable to d/c.   Difficult to place patient No            LOS: 6 days   Time spent: 35 minutes with more than 50% on [place], MD [facility] Pager [phone]  If 7PM-7AM, please  contact night-coverage 02/06/2021, 8:48 AM

## 2021-02-06 NOTE — TOC Progression Note (Addendum)
Transition of Care [facility]) - Progression Note    Patient Details  Name: [name] MRN: [id number] Date of Birth: 04/27/1923  [facility] CM/SW Contact  Milinda Antis, [place] Phone Number: 02/06/2021, 3:07 PM  Clinical Narrative:    CSW presented bed offers to the family.  The family stated that they did not want [facility] or [facility].  The family requested [facility] in [place], but this facility is hesitant due to the patient's "agitation".    CSW informed the family of the information received from [facility] in [place]  and was asked to fax the referral to [facility].  CSW sent the referral to these agencies.    CSW informed the family that [facility] has also accepted the patient.  The family still wants to see if the other facilities will approve.    The insurance company only needs the name of the facility once the family chooses.    [facility] offered a bed and the family accepted.  Patient can possibly go to facility tomorrow if medically ready.   Expected Discharge Plan: Skilled Nursing Facility Barriers to Discharge: Continued Medical Work up,Insurance Authorization,SNF Pending bed offer  Expected Discharge Plan and Services Expected Discharge Plan: [place] arrangements for the past 2 months: Single Family Home                                       Social Determinants of Health (SDOH) Interventions    Readmission Risk Interventions Readmission Risk Prevention Plan 03/15/2020  Post Dischage Appt Complete  Medication Screening Complete  Transportation Screening Complete  Some recent data might be hidden

## 2021-02-07 LAB — URINE CULTURE

## 2021-02-07 MED ORDER — LISINOPRIL 5 MG PO TABS
5.0000 mg | ORAL_TABLET | Freq: Every day | ORAL | Status: DC
Start: 2021-02-07 — End: 2021-02-08

## 2021-02-07 MED ORDER — LISINOPRIL 5 MG PO TABS
5.0000 mg | ORAL_TABLET | Freq: Every day | ORAL | Status: DC
  Administered 2021-02-07 – 2021-02-08 (×2): 5 mg via ORAL
  Filled 2021-02-07 (×2): qty 1

## 2021-02-07 MED ORDER — LIDOCAINE 5 % EX PTCH: 1.0000 | MEDICATED_PATCH | Freq: Every day | CUTANEOUS | 0 refills | Status: AC | PRN

## 2021-02-07 MED ORDER — QUETIAPINE FUMARATE 50 MG PO TABS: 50.0000 mg | ORAL_TABLET | Freq: Every day | ORAL | Status: DC

## 2021-02-07 NOTE — Discharge Summary (Signed)
[name] [id number] DOB: 03-Jul-1923 DOA: 01/31/2021  PCP: Stacy Broker, MD  Admit date: 01/31/2021 Discharge date: 02/07/2021  Admitted From: Home Disposition: Home  Recommendations for Outpatient Follow-up:  1. Follow up with PCP in 1 week 2. Please obtain BMP/CBC in one week 3.  Follow up Dr. Doreatha Martin orthopedics in 2 weeks after hospital discharge for repeat x-rays and wound check   Discharge Condition:Stable CODE STATUS:DNR  Diet recommendation: Regular diet   Brief/Interim Summary: Per [id number] [name] is a 87 y.o. female with medical history significant of HTN, HLD, DM type II, osteoporosis, anxiety, depression, hypothyroidism no longer on medication presents after having a fall at home with complaints of pain on the right buttock and leg.  Normally patient gets around with the use of a rolling walker and has been trying to open the front door when she fell.  She reported that the door was sticking him when she turned around to get back to her walker lost her balance landing on her right side.  X-rays revealed a highly communicated right femur intertrochanteric fracture.  Orthopedics was consulted.    Closed communicated intertrochanteric fracture of the proximal end of the right femursecondary to fall at home: Acute.  Orthopedics was consulted 4/28-s/p Intramedullary Rt Intertronchtric femur fx PT recommends SNF Incentive spirometer  WBAT RLE  SCD for now  Follow-Up with [place] after Hospital Discharge for Repeat X-Rays on Lovenox for DVT prophylaxis and hemoglobin remained stable  Seroquel will be increased to 50 mg nightly for sleep /aggitation Follow-up with Ortho after hospital discharge in 2 weeks for repeat x-ray and wound check   Confusion/aggitation- She was on and off confused and at times aggitation . Was not sleeping. Was started on seroquel and dose increased last night which helped her to sleep well and per family pt is much better.  Appears calm  and interactive.   Post op blood loss-2/2 surgery Hg 6.9, s/p 1 unit prbc Continue iron pills       Essential hypertension:  Continue lisinopril    Hyperlipidemia Continue statins  History of hypothyroidism: Was on levothyroxine 25 MCG daily but has not taking it for some time  TSH normal Further monitoring can be done as outpatient by PCP     Diabetes mellitus type 2:  Apparently diet controlled, not on any medication  continue to monitor  Pt now tolerating food, but has issues sometimes with coughing.  May need GI evaluation as outpt, discussed with daughter during hospitalization     Depression and anxiety -Continue Paxil and mirtazapine  Osteoporosis: Patient with prior history of T12 and T11 fractures back in 03/2020 after having a fall.     Discharge Diagnoses:  Principal Problem:   Hip fracture ([place]) Active Problems:   Type II diabetes mellitus ([place])   Hyperlipidemia associated with type 2 diabetes mellitus ([place])   Hypertension associated with diabetes ([place])   Fall at home, initial encounter   Closed comminuted intertrochanteric fracture of proximal end of right femur ([place])   DNR (do not resuscitate)    Discharge Instructions  Discharge Instructions    Diet - low sodium heart healthy   Complete by: As directed    Discharge wound care:   Complete by: As directed    Incisions may be left open to air Showering: Okay to begin showering 02/04/2021 if no drainage from incision   Increase activity slowly   Complete by: As directed      Allergies as of  02/07/2021      Reactions   Sulfamethoxazole Diarrhea, Other (See Comments)   GI UPSET      Medication List    STOP taking these medications   COD LIVER OIL PO   ibuprofen 200 MG tablet Commonly known as: ADVIL   levothyroxine 25 MCG tablet Commonly known as: SYNTHROID   methocarbamol 500 MG tablet Commonly known as: ROBAXIN   ondansetron 4 MG tablet Commonly known as:  ZOFRAN   oxyCODONE 5 MG immediate release tablet Commonly known as: Oxy IR/ROXICODONE     TAKE these medications   acetaminophen 500 MG tablet Commonly known as: TYLENOL Take 2 tablets (1,000 mg total) by mouth in the morning, at noon, and at bedtime. What changed:   when to take this  reasons to take this   atorvastatin 20 MG tablet Commonly known as: LIPITOR Take 20 mg by mouth at bedtime.   CALCIUM CITRATE PO Take 1 tablet by mouth daily.   COSAMIN DS PO Take 1 tablet by mouth 2 (two) times daily. 1000mg  daily   diclofenac Sodium 1 % Gel Commonly known as: VOLTAREN Apply 2-4 g topically See admin instructions. Apply 2-4 grams to affected areas of the spine, knees, and bilateral shoulders nightly   docusate sodium 100 MG capsule Commonly known as: COLACE Take 1 capsule (100 mg total) by mouth 2 (two) times daily. What changed:   when to take this  reasons to take this   enoxaparin 40 MG/0.4ML injection Commonly known as: Lovenox Inject 0.4 mLs (40 mg total) into the skin daily.   Glucosamine HCl 1000 MG Tabs Take 1,000 mg by mouth in the morning and at bedtime.   HYDROcodone-acetaminophen 5-325 MG tablet Commonly known as: NORCO/VICODIN Take 1 tablet by mouth every 6 (six) hours as needed for severe pain.   ICAPS PO Take 1 capsule by mouth 2 (two) times daily.   IRON 27 PO Take 27 mg by mouth at bedtime.   lidocaine 5 % Commonly known as: LIDODERM Place 1 patch onto the skin daily as needed for up to 14 days (for pain- Remove & Discard patch within 12 hours or as directed by MD). Remove & Discard patch within 12 hours or as directed by MD What changed:   when to take this  reasons to take this   lisinopril 5 MG tablet Commonly known as: ZESTRIL Take 1 tablet (5 mg total) by mouth daily. What changed:   medication strength  how much to take   Magnesium 250 MG Tabs Take 250 mg by mouth at bedtime.   meloxicam 7.5 MG tablet Commonly known  as: MOBIC Take 7.5 mg by mouth daily.   mirtazapine 15 MG disintegrating tablet Commonly known as: REMERON SOL-TAB Take 15 mg by mouth at bedtime.   omeprazole 40 MG capsule Commonly known as: PRILOSEC Take 40 mg by mouth daily before breakfast.   PARoxetine 10 MG tablet Commonly known as: PAXIL Take 10 mg by mouth at bedtime.   Polyethyl Glycol-Propyl Glycol 0.4-0.3 % Soln Place 1 drop into both eyes at bedtime.   polyethylene glycol 17 g packet Commonly known as: MIRALAX / GLYCOLAX Take 17 g by mouth daily as needed for moderate constipation. What changed:   how much to take  when to take this  additional instructions   psyllium 58.6 % packet Commonly known as: METAMUCIL Take 1 packet by mouth daily at 12 noon.   QUEtiapine 50 MG tablet Commonly known as: SEROQUEL Take 1  tablet (50 mg total) by mouth at bedtime.   senna 8.6 MG Tabs tablet Commonly known as: SENOKOT Take 1 tablet (8.6 mg total) by mouth 2 (two) times daily. What changed:   when to take this  reasons to take this   Vitamin D3 25 MCG tablet Commonly known as: Vitamin D Take 1 tablet (1,000 Units total) by mouth daily.            Discharge Care Instructions  (From admission, onward)         Start     Ordered   02/07/21 0000  Discharge wound care:       Comments: Incisions may be left open to air Showering: Okay to begin showering 02/04/2021 if no drainage from incision   02/07/21 1154          Contact information for follow-up providers    Haddix, Thomasene Lot, MD. Schedule an appointment as soon as possible for a visit in 2 week(s).   Specialty: Orthopedic Surgery Why: for repeat x-rays and wound check Contact information: [place] [address] [phone]            Contact information for after-discharge care    Destination    [facility] Preferred SNF .   Service: Skilled Nursing Contact information: [place] [phone]                 Allergies  Allergen Reactions  . Sulfamethoxazole Diarrhea and Other (See Comments)    GI UPSET    Consultations:  Orthopedics   Procedures/Studies: DG Chest 1 View  Result Date: 01/31/2021 CLINICAL DATA:  Fall. EXAM: CHEST  1 VIEW COMPARISON:  None. FINDINGS: Similar cardiomediastinal silhouette. Aortic atherosclerosis. Both lungs are clear. No visible pleural effusions or pneumothorax on this single AP radiograph. No evidence of acute osseous abnormality. Polyarticular degenerative change. IMPRESSION: No evidence of acute cardiopulmonary disease. Electronically Signed   By: Margaretha Sheffield MD   On: 01/31/2021 10:45   DG Lumbar Spine Complete  Result Date: 01/31/2021 CLINICAL DATA:  87 year old female status post fall at home. Back pain. EXAM: LUMBAR SPINE - COMPLETE 4+ VIEW COMPARISON:  Lumbar spine and chest CT 03/08/2020. FINDINGS: Chronic T11 and T12 vertebral fractures with progressive kyphotic deformity since last year. Previously augmented T12 level as before. No associated spondylolisthesis is evident. L1 through L5 appear stable and intact. L1 benign vertebral body hemangioma better demonstrated by CT. Stable lumbar lordosis. Chronic lumbar disc and endplate degeneration maximal at L1-L2. Other visible thoracic levels appear grossly intact. Grossly stable visible pelvis. Stable cholecystectomy clips. Calcified aortic atherosclerosis. IMPRESSION: 1. No acute osseous abnormality identified in the lumbar spine. 2. Expected evolution of the T11 and T12 vertebral fractures since last year with associated progressive kyphosis there. 3. Chronic lumbar spine degeneration. Aortic Atherosclerosis (ICD10-I70.0). Electronically Signed   By: Genevie Ann M.D.   On: 01/31/2021 10:48   CT Head Wo Contrast  Result Date: 01/31/2021 CLINICAL DATA:  87 year old female status post fall at home. EXAM: CT HEAD WITHOUT CONTRAST CT CERVICAL SPINE  WITHOUT CONTRAST TECHNIQUE: Multidetector CT imaging of the head and cervical spine was performed following the standard protocol without intravenous contrast. Multiplanar CT image reconstructions of the cervical spine were also generated. COMPARISON:  Brain MRI 11/05/2005.  Head CT 02/29/2020. Thoracic spine MRI 03/10/2020. FINDINGS: CT HEAD FINDINGS Brain: Stable cerebral volume since last year. No midline shift, ventriculomegaly, mass effect, evidence  of mass lesion, intracranial hemorrhage or evidence of cortically based acute infarction. Patchy and confluent bilateral white matter hypodensity appears stable. No cortical encephalomalacia identified. Vascular: Calcified atherosclerosis at the skull base. No suspicious intracranial vascular hyperdensity. Skull: No fracture identified. Sinuses/Orbits: Bilateral middle ear and mastoid opacification is new from last year. Negative visible nasopharynx. Paranasal sinuses remain well aerated. Other: No acute orbit or scalp soft tissue finding. CT CERVICAL SPINE FINDINGS Alignment: Preserved cervical lordosis. Cervicothoracic junction alignment is within normal limits. Bilateral posterior element alignment is within normal limits. Skull base and vertebrae: Osteopenia. Visualized skull base is intact. No atlanto-occipital dissociation. C1 and C2 appear intact and aligned. No acute osseous abnormality identified. Multilevel degenerative cervical spine ankylosis, including C7-T1 bilateral facet ankylosis. Soft tissues and spinal canal: No prevertebral fluid or swelling. No visible canal hematoma. Disc levels: Bulky degenerative partially calcified ligamentous hypertrophy about the odontoid. Degenerative subchondral cyst at the right base of the odontoid. Degenerative appearing ankylosis of C2 through C5. Advanced disc degeneration with vacuum dist at the unfused C6-C7 level. C7-T1 facet and interbody ankylosis. Upper chest: Visible upper thoracic levels appear stable and  grossly intact. Stable right upper lobe lung nodule on series 9, image 88, benign. Calcified aortic atherosclerosis. IMPRESSION: 1. No acute traumatic injury identified in the head or cervical spine. 2. Stable non contrast CT appearance of the brain since last year. 3. New bilateral middle ear and mastoid opacification with no associated skull base fracture identified. Query bilateral otitis media. 4. Multilevel cervical spine degeneration and ankylosis. 5. Aortic Atherosclerosis (ICD10-I70.0). Electronically Signed   By: Genevie Ann M.D.   On: 01/31/2021 10:57   CT Cervical Spine Wo Contrast  Result Date: 01/31/2021 CLINICAL DATA:  87 year old female status post fall at home. EXAM: CT HEAD WITHOUT CONTRAST CT CERVICAL SPINE WITHOUT CONTRAST TECHNIQUE: Multidetector CT imaging of the head and cervical spine was performed following the standard protocol without intravenous contrast. Multiplanar CT image reconstructions of the cervical spine were also generated. COMPARISON:  Brain MRI 11/05/2005.  Head CT 02/29/2020. Thoracic spine MRI 03/10/2020. FINDINGS: CT HEAD FINDINGS Brain: Stable cerebral volume since last year. No midline shift, ventriculomegaly, mass effect, evidence of mass lesion, intracranial hemorrhage or evidence of cortically based acute infarction. Patchy and confluent bilateral white matter hypodensity appears stable. No cortical encephalomalacia identified. Vascular: Calcified atherosclerosis at the skull base. No suspicious intracranial vascular hyperdensity. Skull: No fracture identified. Sinuses/Orbits: Bilateral middle ear and mastoid opacification is new from last year. Negative visible nasopharynx. Paranasal sinuses remain well aerated. Other: No acute orbit or scalp soft tissue finding. CT CERVICAL SPINE FINDINGS Alignment: Preserved cervical lordosis. Cervicothoracic junction alignment is within normal limits. Bilateral posterior element alignment is within normal limits. Skull base and  vertebrae: Osteopenia. Visualized skull base is intact. No atlanto-occipital dissociation. C1 and C2 appear intact and aligned. No acute osseous abnormality identified. Multilevel degenerative cervical spine ankylosis, including C7-T1 bilateral facet ankylosis. Soft tissues and spinal canal: No prevertebral fluid or swelling. No visible canal hematoma. Disc levels: Bulky degenerative partially calcified ligamentous hypertrophy about the odontoid. Degenerative subchondral cyst at the right base of the odontoid. Degenerative appearing ankylosis of C2 through C5. Advanced disc degeneration with vacuum dist at the unfused C6-C7 level. C7-T1 facet and interbody ankylosis. Upper chest: Visible upper thoracic levels appear stable and grossly intact. Stable right upper lobe lung nodule on series 9, image 88, benign. Calcified aortic atherosclerosis. IMPRESSION: 1. No acute traumatic injury identified in the head or cervical spine.  2. Stable non contrast CT appearance of the brain since last year. 3. New bilateral middle ear and mastoid opacification with no associated skull base fracture identified. Query bilateral otitis media. 4. Multilevel cervical spine degeneration and ankylosis. 5. Aortic Atherosclerosis (ICD10-I70.0). Electronically Signed   By: Genevie Ann M.D.   On: 01/31/2021 10:57   Pelvis Portable  Result Date: 02/01/2021 CLINICAL DATA:  Postop. EXAM: PORTABLE PELVIS 1-2 VIEWS; RIGHT FEMUR PORTABLE 2 VIEW COMPARISON:  None. FINDINGS: AP view of the pelvis with AP and lateral views of the right femur. Intramedullary rod with trans trochanteric and distal locking screw fixation of intertrochanteric femur fracture. Decreased angulation of femur fracture from preoperative radiographs. No acute periprosthetic lucency. Recent postsurgical change includes air and edema in the soft tissues. Remote left pubic rami fractures. Left knee arthroplasty. IMPRESSION: Intramedullary rod with trans trochanteric and distal locking  screw fixation of intertrochanteric right femur fracture, with improved alignment from preoperative radiographs. No immediate postoperative complication. Electronically Signed   By: Keith Rake M.D.   On: 02/01/2021 19:01   DG CHEST PORT 1 VIEW  Result Date: 02/03/2021 CLINICAL DATA:  Hypoxia. EXAM: PORTABLE CHEST 1 VIEW COMPARISON:  January 31, 2021. FINDINGS: Stable cardiomediastinal silhouette. No pneumothorax or pleural effusion is noted. Hypoinflation of the lungs is noted with minimal bibasilar subsegmental atelectasis. Bony thorax is unremarkable. IMPRESSION: Hypoinflation of the lungs with minimal bibasilar subsegmental atelectasis. Electronically Signed   By: Marijo Conception M.D.   On: 02/03/2021 14:52   DG Knee Right Port  Result Date: 01/31/2021 CLINICAL DATA:  Right hip fracture. EXAM: PORTABLE RIGHT KNEE - 1-2 VIEW COMPARISON:  None. FINDINGS: Right knee arthroplasty in expected alignment. No periprosthetic lucency or fracture. There has been patellar resurfacing. No focal bone abnormality. No significant joint effusion. IMPRESSION: Right knee arthroplasty without complication or acute fracture. Electronically Signed   By: Keith Rake M.D.   On: 01/31/2021 21:38   DG C-Arm 1-60 Min  Result Date: 02/01/2021 CLINICAL DATA:  Right femur fraction fixation. EXAM: RIGHT FEMUR 2 VIEWS; DG C-ARM 1-60 MIN COMPARISON:  Preoperative radiographs yesterday. FINDINGS: Seven fluoroscopic spot views of the right femur obtained in the operating room. Intramedullary nail with trans trochanteric and distal locking screws traverse intertrochanteric femur fracture. Right knee arthroplasty is partially visualized. Total fluoroscopy time 1 minutes 32 seconds. Total dose 14.6 mGy. IMPRESSION: Intraoperative fluoroscopy during right femur fracture ORIF. Electronically Signed   By: Keith Rake M.D.   On: 02/01/2021 17:58   DG Hip Unilat W or Wo Pelvis 2-3 Views Right  Result Date: 01/31/2021 CLINICAL  DATA:  87 year old female status post fall at home.  Pain. EXAM: DG HIP (WITH OR WITHOUT PELVIS) 2-3V RIGHT COMPARISON:  Hip series 02/29/2020. FINDINGS: Highly comminuted right femur intertrochanteric fracture with varus impaction. The right femoral head remains normally located. Extensive chronic bilateral pubic symphysis fractures appear stable. Grossly intact proximal left femur. No other acute osseous abnormality identified about the pelvis. Negative visible bowel gas pattern. IMPRESSION: 1. Highly comminuted right femur intertrochanteric fracture with varus impaction. 2. Chronic bilateral pubic symphysis fractures. Electronically Signed   By: Genevie Ann M.D.   On: 01/31/2021 10:44   DG FEMUR, MIN 2 VIEWS RIGHT  Result Date: 02/01/2021 CLINICAL DATA:  Right femur fraction fixation. EXAM: RIGHT FEMUR 2 VIEWS; DG C-ARM 1-60 MIN COMPARISON:  Preoperative radiographs yesterday. FINDINGS: Seven fluoroscopic spot views of the right femur obtained in the operating room. Intramedullary nail with trans trochanteric and  distal locking screws traverse intertrochanteric femur fracture. Right knee arthroplasty is partially visualized. Total fluoroscopy time 1 minutes 32 seconds. Total dose 14.6 mGy. IMPRESSION: Intraoperative fluoroscopy during right femur fracture ORIF. Electronically Signed   By: Keith Rake M.D.   On: 02/01/2021 17:58   DG FEMUR PORT, MIN 2 VIEWS RIGHT  Result Date: 02/01/2021 CLINICAL DATA:  Postop. EXAM: PORTABLE PELVIS 1-2 VIEWS; RIGHT FEMUR PORTABLE 2 VIEW COMPARISON:  None. FINDINGS: AP view of the pelvis with AP and lateral views of the right femur. Intramedullary rod with trans trochanteric and distal locking screw fixation of intertrochanteric femur fracture. Decreased angulation of femur fracture from preoperative radiographs. No acute periprosthetic lucency. Recent postsurgical change includes air and edema in the soft tissues. Remote left pubic rami fractures. Left knee arthroplasty.  IMPRESSION: Intramedullary rod with trans trochanteric and distal locking screw fixation of intertrochanteric right femur fracture, with improved alignment from preoperative radiographs. No immediate postoperative complication. Electronically Signed   By: Keith Rake M.D.   On: 02/01/2021 19:01       Subjective: Sitting in bed eating breakfast. No complaints, daughter reports slept well. No aggitation .  Discharge Exam: Vitals:   02/06/21 2120 02/07/21 0922  BP: (!) 148/68 (!) 126/54  Pulse: 98 94  Resp: 16 16  Temp: 98.4 F (36.9 C) 97.8 F (36.6 C)  SpO2: 93% 93%   Vitals:   02/06/21 0732 02/06/21 1505 02/06/21 2120 02/07/21 0922  BP: (!) 150/87 (!) 141/69 (!) 148/68 (!) 126/54  Pulse: 93 100 98 94  Resp: 17 17 16 16   Temp: 97.7 F (36.5 C) 98.2 F (36.8 C) 98.4 F (36.9 C) 97.8 F (36.6 C)  TempSrc: Oral Oral Oral Oral  SpO2: 96% 93% 93% 93%  Weight:      Height:        General: Pt is alert, awake, not in acute distress Cardiovascular: RRR, S1/S2 +, no rubs, no gallops Respiratory: CTA bilaterally, no wheezing, no rhonchi Abdominal: Soft, NT, ND, bowel sounds + Extremities: no edema    The results of significant diagnostics from this hospitalization (including imaging, microbiology, ancillary and laboratory) are listed below for reference.     Microbiology: Recent Results (from the past 240 hour(s))  Resp Panel by RT-PCR (Flu A&Stacy, Covid) Nasopharyngeal Swab     Status: None   Collection Time: 01/31/21 10:48 AM   Specimen: Nasopharyngeal Swab; Nasopharyngeal(NP) swabs in vial transport medium  Result Value Ref Range Status   SARS Coronavirus 2 by RT PCR NEGATIVE NEGATIVE Final    Comment: (NOTE) SARS-CoV-2 target nucleic acids are NOT DETECTED.  The SARS-CoV-2 RNA is generally detectable in upper respiratory specimens during the acute phase of infection. The lowest concentration of SARS-CoV-2 viral copies this assay can detect is 138 copies/mL. A  negative result does not preclude SARS-Cov-2 infection and should not be used as the sole basis for treatment or other patient management decisions. A negative result may occur with  improper specimen collection/handling, submission of specimen other than nasopharyngeal swab, presence of viral mutation(s) within the areas targeted by this assay, and inadequate number of viral copies(<138 copies/mL). A negative result must be combined with clinical observations, patient history, and epidemiological information. The expected result is Negative.  Fact Sheet for Patients:  EntrepreneurPulse.com.au  Fact Sheet for Healthcare Providers:  IncredibleEmployment.be  This test is no t yet approved or cleared by the [place] FDA and  has been authorized for detection and/or diagnosis of SARS-CoV-2 by FDA  under an Emergency Use Authorization (EUA). This EUA will remain  in effect (meaning this test can be used) for the duration of the COVID-19 declaration under Section 564(Stacy)(1) of the Act, 21 U.S.C.section 360bbb-3(Stacy)(1), unless the authorization is terminated  or revoked sooner.       Influenza A by PCR NEGATIVE NEGATIVE Final   Influenza [place] by PCR NEGATIVE NEGATIVE Final    Comment: (NOTE) The Xpert Xpress SARS-CoV-2/FLU/RSV plus assay is intended as an aid in the diagnosis of influenza from Nasopharyngeal swab specimens and should not be used as a sole basis for treatment. Nasal washings and aspirates are unacceptable for Xpert Xpress SARS-CoV-2/FLU/RSV testing.  Fact Sheet for Patients: EntrepreneurPulse.com.au  Fact Sheet for Healthcare Providers: IncredibleEmployment.be  This test is not yet approved or cleared by the [place] FDA and has been authorized for detection and/or diagnosis of SARS-CoV-2 by FDA under an Emergency Use Authorization (EUA). This EUA will remain in effect (meaning this test can  be used) for the duration of the COVID-19 declaration under Section 564(Stacy)(1) of the Act, 21 U.S.C. section 360bbb-3(Stacy)(1), unless the authorization is terminated or revoked.  Performed at [facility], [place] [address] [place], [place] [address]   Surgical PCR screen     Status: None   Collection Time: 01/31/21  4:03 PM   Specimen: Nasal Mucosa; Nasal Swab  Result Value Ref Range Status   MRSA, PCR NEGATIVE NEGATIVE Final   Staphylococcus aureus NEGATIVE NEGATIVE Final    Comment: (NOTE) The Xpert SA Assay (FDA approved for NASAL specimens in patients 57 years of age and older), is one component of a comprehensive surveillance program. It is not intended to diagnose infection nor to guide or monitor treatment. Performed at [facility], [place] [address] [place],  [address]      Labs: BNP (last 3 results) No results for input(s): BNP in the last 8760 hours. Basic Metabolic Panel: Recent Labs  Lab 02/01/21 0308 02/02/21 0458 02/03/21 0025 02/04/21 1249 02/05/21 1318  NA 139 135 134*  --  137  K 4.9 4.5 4.1  --  4.1  CL 104 103 100  --  106  CO2 29 26 26   --  27  GLUCOSE 138* 146* 162*  --  105*  BUN 28* 28* 35*  --  24*  CREATININE 0.88 0.89 1.09* 0.82 0.75  CALCIUM 8.9 8.3* 8.5*  --  8.1*   Liver Function Tests: No results for input(s): AST, ALT, ALKPHOS, BILITOT, PROT, ALBUMIN in the last 168 hours. No results for input(s): LIPASE, AMYLASE in the last 168 hours. No results for input(s): AMMONIA in the last 168 hours. CBC: Recent Labs  Lab 02/02/21 0458 02/03/21 0025 02/04/21 0244 02/04/21 1249 02/05/21 0122 02/06/21 0426  WBC 8.3 7.7 6.6  --  6.3 6.2  HGB 7.8* 7.5* 6.9* 8.6* 8.7* 9.3*  HCT 24.4* 23.5* 21.9* 26.7* 26.9* 28.6*  MCV 102.1* 103.1* 103.3*  --  98.5 98.3  PLT 204 221 232  --  259 292   Cardiac Enzymes: No results for input(s): CKTOTAL, CKMB, CKMBINDEX, TROPONINI in the last 168 hours. BNP: Invalid input(s):  POCBNP CBG: Recent Labs  Lab 02/04/21 2016 02/04/21 2327 02/05/21 0638 02/05/21 0810 02/05/21 1433  GLUCAP 167* 110* 109* 127* 172*   D-Dimer No results for input(s): DDIMER in the last 72 hours. Hgb A1c No results for input(s): HGBA1C in the last 72 hours. Lipid Profile No results for input(s): CHOL, HDL, LDLCALC,  TRIG, CHOLHDL, LDLDIRECT in the last 72 hours. Thyroid function studies No results for input(s): TSH, T4TOTAL, T3FREE, THYROIDAB in the last 72 hours.  Invalid input(s): FREET3 Anemia work up No results for input(s): VITAMINB12, FOLATE, FERRITIN, TIBC, IRON, RETICCTPCT in the last 72 hours. Urinalysis    Component Value Date/Time   COLORURINE YELLOW 02/06/2021 1620   APPEARANCEUR CLEAR 02/06/2021 1620   LABSPEC 1.014 02/06/2021 1620   PHURINE 5.0 02/06/2021 1620   GLUCOSEU NEGATIVE 02/06/2021 1620   HGBUR NEGATIVE 02/06/2021 1620   [place] 02/06/2021 1620   KETONESUR NEGATIVE 02/06/2021 1620   PROTEINUR NEGATIVE 02/06/2021 1620   NITRITE NEGATIVE 02/06/2021 1620   LEUKOCYTESUR NEGATIVE 02/06/2021 1620   Sepsis Labs Invalid input(s): PROCALCITONIN,  WBC,  LACTICIDVEN Microbiology Recent Results (from the past 240 hour(s))  Resp Panel by RT-PCR (Flu A&Stacy, Covid) Nasopharyngeal Swab     Status: None   Collection Time: 01/31/21 10:48 AM   Specimen: Nasopharyngeal Swab; Nasopharyngeal(NP) swabs in vial transport medium  Result Value Ref Range Status   SARS Coronavirus 2 by RT PCR NEGATIVE NEGATIVE Final    Comment: (NOTE) SARS-CoV-2 target nucleic acids are NOT DETECTED.  The SARS-CoV-2 RNA is generally detectable in upper respiratory specimens during the acute phase of infection. The lowest concentration of SARS-CoV-2 viral copies this assay can detect is 138 copies/mL. A negative result does not preclude SARS-Cov-2 infection and should not be used as the sole basis for treatment or other patient management decisions. A negative result may  occur with  improper specimen collection/handling, submission of specimen other than nasopharyngeal swab, presence of viral mutation(s) within the areas targeted by this assay, and inadequate number of viral copies(<138 copies/mL). A negative result must be combined with clinical observations, patient history, and epidemiological information. The expected result is Negative.  Fact Sheet for Patients:  EntrepreneurPulse.com.au  Fact Sheet for Healthcare Providers:  IncredibleEmployment.be  This test is no t yet approved or cleared by the [place] FDA and  has been authorized for detection and/or diagnosis of SARS-CoV-2 by FDA under an Emergency Use Authorization (EUA). This EUA will remain  in effect (meaning this test can be used) for the duration of the COVID-19 declaration under Section 564(Stacy)(1) of the Act, 21 U.S.C.section 360bbb-3(Stacy)(1), unless the authorization is terminated  or revoked sooner.       Influenza A by PCR NEGATIVE NEGATIVE Final   Influenza Stacy by PCR NEGATIVE NEGATIVE Final    Comment: (NOTE) The Xpert Xpress SARS-CoV-2/FLU/RSV plus assay is intended as an aid in the diagnosis of influenza from Nasopharyngeal swab specimens and should not be used as a sole basis for treatment. Nasal washings and aspirates are unacceptable for Xpert Xpress SARS-CoV-2/FLU/RSV testing.  Fact Sheet for Patients: EntrepreneurPulse.com.au  Fact Sheet for Healthcare Providers: IncredibleEmployment.be  This test is not yet approved or cleared by the [place] FDA and has been authorized for detection and/or diagnosis of SARS-CoV-2 by FDA under an Emergency Use Authorization (EUA). This EUA will remain in effect (meaning this test can be used) for the duration of the COVID-19 declaration under Section 564(Stacy)(1) of the Act, 21 U.S.C. section 360bbb-3(Stacy)(1), unless the authorization is terminated  or revoked.  Performed at [facility], [place] [address], [place], [place] [address]   Surgical PCR screen     Status: None   Collection Time: 01/31/21  4:03 PM   Specimen: Nasal Mucosa; Nasal Swab  Result Value Ref Range Status   MRSA, PCR NEGATIVE  NEGATIVE Final   Staphylococcus aureus NEGATIVE NEGATIVE Final    Comment: (NOTE) The Xpert SA Assay (FDA approved for NASAL specimens in patients 16 years of age and older), is one component of a comprehensive surveillance program. It is not intended to diagnose infection nor to guide or monitor treatment. Performed at [facility], [place] [address], [place], [place] [address]      Time coordinating discharge: Over 30 minutes  SIGNED:   Nolberto Hanlon, MD  [facility] 02/07/2021, 11:54 AM Pager   If 7PM-7AM, please contact night-coverage [email or web address] Password [email or web address]

## 2021-02-07 NOTE — Care Management Important Message (Signed)
Important Message  Patient Details  Name: [name] MRN: [id number] Date of Birth: [birth year]   Medicare Important Message Given:  Yes     [name] P [name] 02/07/2021, 12:44 PM

## 2021-02-07 NOTE — Progress Notes (Signed)
HOSPITAL MEDICINE OVERNIGHT EVENT NOTE    Notified by nursing that daughter is stating that she does not want [place] to discharge her father this late in the evening "because it is not safe with his dementia."  Chart reviewed, patient is being discharged to a skilled nursing facility which should provide adequate resources and accepting a discharge from the hospital at this hour the eating.  Daughter unfortunately could not be located for a face-to-face conversation and therefore I attempted to call to explain the situation to them.  They did not answer the phone as well.  Unfortunately, with family's refusal to allow the patient to go this evening we we will have to cancel the discharge of this evening and attempt to proceed with discharge early tomorrow.  Vernelle Emerald  MD [facility]

## 2021-02-07 NOTE — TOC Transition Note (Signed)
Transition of Care [facility]) - CM/SW Discharge Note   Patient Details  Name: [name] MRN: [id number] Date of Birth: 03/24/1923  [facility]) CM/SW Contact:  Milinda Antis, [place] Phone Number: 02/07/2021, 12:51 PM   Clinical Narrative:    Patient will DC to: [place] date: 02/07/3021 Family notified:yes Transport by: Corey Harold   Per MD patient ready for DC to . RN to call report prior to discharge [phone]. RN, patient, patient's family, and facility notified of DC. Discharge Summary and FL2 sent to facility. DC packet on chart. Ambulance transport requested for patient.   CSW will sign off for now as social work intervention is no longer needed. Please consult [place] again if new needs arise.     Final next level of care: Skilled Nursing Facility Barriers to Discharge: Barriers Resolved   Patient Goals and CMS Choice Patient states their goals for this hospitalization and ongoing recovery are:: SPoke with patient's daughter.  Patient disoriented at the time of encounter CMS Medicare.gov Compare Post Acute Care list provided to:: Patient Represenative (must comment) (daughter) Choice offered to / list presented to : Adult Children  Discharge Placement              Patient chooses bed at: [facility] Patient to be transferred to facility by: [place] Name of family member notified: Cleda Mccreedy Daughter     [phone] Patient and family notified of of transfer: 02/07/21  Discharge Plan and Services                                     Social Determinants of Health (SDOH) Interventions     Readmission Risk Interventions Readmission Risk Prevention Plan 03/15/2020  Post Dischage Appt Complete  Medication Screening Complete  Transportation Screening Complete  Some recent data might be hidden

## 2021-02-07 NOTE — Progress Notes (Signed)
Patient's daughter Izora Gala) requested that she wanted her mother to be discharged tomorrow morning because it's already late. She said she is not comfortable  her mom to be transported tonight and has a concern about her dementia. Informed the on call on duty.   Informed Izora Gala about the cancellation of the discharge and the patient needs to be reassessed tomorrow before discharge. She acknowledged and no issue.   Informed TPAR regarding the cancellation of discharge.

## 2021-02-07 NOTE — Progress Notes (Signed)
Report called to [employer]. All questions answered. Pt belongings gathered to be sent with her. Pt waiting on PTAR for transport.

## 2021-02-07 NOTE — Plan of Care (Signed)
  Problem: Activity: Goal: Risk for activity intolerance will decrease Outcome: Progressing   Problem: Elimination: Goal: Will not experience complications related to bowel motility Outcome: Completed/Met   Problem: Safety: Goal: Ability to remain free from injury will improve Outcome: Progressing

## 2021-02-08 DIAGNOSIS — S72001D Fracture of unspecified part of neck of right femur, subsequent encounter for closed fracture with routine healing: Secondary | ICD-10-CM

## 2021-02-08 MED ORDER — HYDROCODONE-ACETAMINOPHEN 5-325 MG PO TABS: 1.0000 | ORAL_TABLET | Freq: Four times a day (QID) | ORAL | 0 refills | Status: DC | PRN

## 2021-02-08 MED ORDER — LISINOPRIL 5 MG PO TABS: 5.0000 mg | ORAL_TABLET | Freq: Every day | ORAL | 0 refills | Status: DC

## 2021-02-08 MED ORDER — MIRTAZAPINE 15 MG PO TBDP: 15.0000 mg | ORAL_TABLET | Freq: Every day | ORAL | 0 refills | Status: DC

## 2021-02-08 MED ORDER — MELOXICAM 7.5 MG PO TABS: 7.5000 mg | ORAL_TABLET | Freq: Every day | ORAL | 0 refills | Status: DC

## 2021-02-08 NOTE — Plan of Care (Signed)
  Problem: Education: Goal: Knowledge of General Education information will improve Description: Including pain rating scale, medication(s)/side effects and non-pharmacologic comfort measures Outcome: Progressing   Problem: Health Behavior/Discharge Planning: Goal: Ability to manage health-related needs will improve Outcome: Progressing   Problem: Pain Managment: Goal: General experience of comfort will improve Outcome: Progressing   Problem: Elimination: Goal: Will not experience complications related to urinary retention Outcome: Progressing   Problem: Nutrition: Goal: Adequate nutrition will be maintained Outcome: Progressing   Problem: Safety: Goal: Ability to remain free from injury will improve Outcome: Progressing   Problem: Skin Integrity: Goal: Risk for impaired skin integrity will decrease Outcome: Progressing

## 2021-02-08 NOTE — Discharge Summary (Signed)
Patient seen and examined. She is alert, conversant. Denies pain. She has had several BM after receiving laxatives. Please see Discharge summary done by Nolberto Hanlon 5/03/222 for detail.   Patient stable to be discharge today./

## 2021-02-08 NOTE — TOC Transition Note (Signed)
Transition of Care [facility]) - CM/SW Discharge Note   Patient Details  Name: [name] MRN: [id number] Date of Birth: 18-Dec-1922  [facility]) CM/SW Contact:  Milinda Antis, [place] Phone Number: 02/08/2021, 10:27 AM   Clinical Narrative:    Patient did not d/c yesterday as scheduled due to the family not wanting the patient to leave late at night.  CSW contacted the facility who is willing to take the patient today.  PTAR was contacted and family notified.    The patient's status remains the same as it was yesterday.  See transition note from 02/07/2021.   Final next level of care: Skilled Nursing Facility Barriers to Discharge: Barriers Resolved   Patient Goals and CMS Choice Patient states their goals for this hospitalization and ongoing recovery are:: SPoke with patient's daughter.  Patient disoriented at the time of encounter CMS Medicare.gov Compare Post Acute Care list provided to:: Patient Represenative (must comment) (daughter) Choice offered to / list presented to : Adult Children  Discharge Placement              Patient chooses bed at: [facility] Patient to be transferred to facility by: [place] Name of family member notified: Cleda Mccreedy Daughter     [phone] Patient and family notified of of transfer: 02/07/21  Discharge Plan and Services                                     Social Determinants of Health (SDOH) Interventions     Readmission Risk Interventions Readmission Risk Prevention Plan 03/15/2020  Post Dischage Appt Complete  Medication Screening Complete  Transportation Screening Complete  Some recent data might be hidden

## 2021-02-08 NOTE — Progress Notes (Addendum)
Physical Therapy Treatment Patient Details Name: [name] MRN: [id number] DOB: [birth year] Today's Date: 02/08/2021    History of Present Illness 87 yo female s/p cephalomedullary nailing of R intertrochanteric femur fracture on 4/27 after fall. CT head and neck negative for acute changes. PMH includes anxiety, depression, pelvic fracture, HTN, macular degeneration, osteoporosis, DMII, T12 fx 03/2020, R TKR 2018.    PT Comments    Pt supine in bed on arrival.  Pt attempting to use bed pan but unsuccessful in attempts.  Performed squat pivot from bed to bedside commode and she was able to have BM,  Returned back to bed and PTAR arrived so left in care of RN and PTAR.  Plan to d/c to snf.     Follow Up Recommendations  SNF     Equipment Recommendations  None recommended by PT    Recommendations for Other Services       Precautions / Restrictions Precautions Precautions: Fall Restrictions Weight Bearing Restrictions: Yes RLE Weight Bearing: Weight bearing as tolerated    Mobility  Bed Mobility Overal bed mobility: Needs Assistance Bed Mobility: Supine to Sit     Supine to sit: Min assist Sit to supine: Max assist   General bed mobility comments: Min assistance to move to edge of bed with R LE supported.  To move back to bed increased assistance to lift B LEs and move back to bed.    Transfers Overall transfer level: Needs assistance Equipment used: Rolling walker (2 wheeled);None Transfers: Sit to/from [employer] Sit to Stand: Max assist;+2 safety/equipment   Squat pivot transfers: Max assist     General transfer comment: Performed squat pivot from bed to bedside commode and back to bed.  Pt required assistance to lift and boost hips.  Performed sit to stand at [facility] for pericare,  Ambulation/Gait Ambulation/Gait assistance:  (remains unable to progress to gt training at this time but required decreased assistance with her transfers.)                Stairs             Wheelchair Mobility    Modified Rankin (Stroke Patients Only)       Balance Overall balance assessment: Needs assistance   Sitting balance-Leahy Scale: Fair       Standing balance-Leahy Scale: Poor                              Cognition Arousal/Alertness: Awake/alert Behavior During Therapy: WFL for tasks assessed/performed Overall Cognitive Status: History of cognitive impairments - at baseline                                        Exercises      General Comments        Pertinent Vitals/Pain Pain Assessment: No/denies pain Faces Pain Scale: Hurts little more Pain Location: R LE Pain Descriptors / Indicators: Discomfort Pain Intervention(s): Monitored during session;Repositioned    Home Living                      Prior Function            PT Goals (current goals can now be found in the care plan section) Acute Rehab PT Goals Patient Stated Goal: get better, walk again Potential to Achieve Goals: Good Progress  towards PT goals: Progressing toward goals    Frequency    Min 3X/week      PT Plan Current plan remains appropriate    Co-evaluation              AM-PAC PT "6 Clicks" Mobility   Outcome Measure  Help needed turning from your back to your side while in a flat bed without using bedrails?: A Little Help needed moving from lying on your back to sitting on the side of a flat bed without using bedrails?: A Little Help needed moving to and from a bed to a chair (including a wheelchair)?: A Lot Help needed standing up from a chair using your arms (e.g., wheelchair or bedside chair)?: A Lot Help needed to walk in hospital room?: Total Help needed climbing 3-5 steps with a railing? : Total 6 Click Score: 12    End of Session Equipment Utilized During Treatment: Gait belt Activity Tolerance: Patient limited by fatigue Patient left: in bed (PTAR present to transport  patient to next level of care.) Nurse Communication: Mobility status PT Visit Diagnosis: Other abnormalities of gait and mobility (R26.89);Difficulty in walking, not elsewhere classified (R26.2);Muscle weakness (generalized) (M62.81)     Time: 1100-1111 PT Time Calculation (min) (ACUTE ONLY): 11 min  Charges:  $Therapeutic Activity: 8-22 mins                     Erasmo Leventhal , PTA Acute Rehabilitation Services Pager [phone] Office [place] 02/08/2021, 11:21 AM

## 2021-02-13 ENCOUNTER — Encounter (HOSPITAL_COMMUNITY): Payer: Self-pay | Admitting: Student

## 2021-05-24 ENCOUNTER — Ambulatory Visit: Payer: Self-pay | Admitting: Student

## 2021-05-24 DIAGNOSIS — T8484XA Pain due to internal orthopedic prosthetic devices, implants and grafts, initial encounter: Secondary | ICD-10-CM | POA: Insufficient documentation

## 2021-05-24 DIAGNOSIS — S72001A Fracture of unspecified part of neck of right femur, initial encounter for closed fracture: Secondary | ICD-10-CM

## 2021-06-02 IMAGING — DX DG HIP (WITH OR WITHOUT PELVIS) 2-3V*R*
3 series · 3 of 3 positions shown · non-contrast
Comparison: Hip series 02/29/2020.

CLINICAL DATA: [AGE] female status post fall at home.  Pain.

EXAM:
DG HIP (WITH OR WITHOUT PELVIS) 2-3V RIGHT

[t pelvis ap]
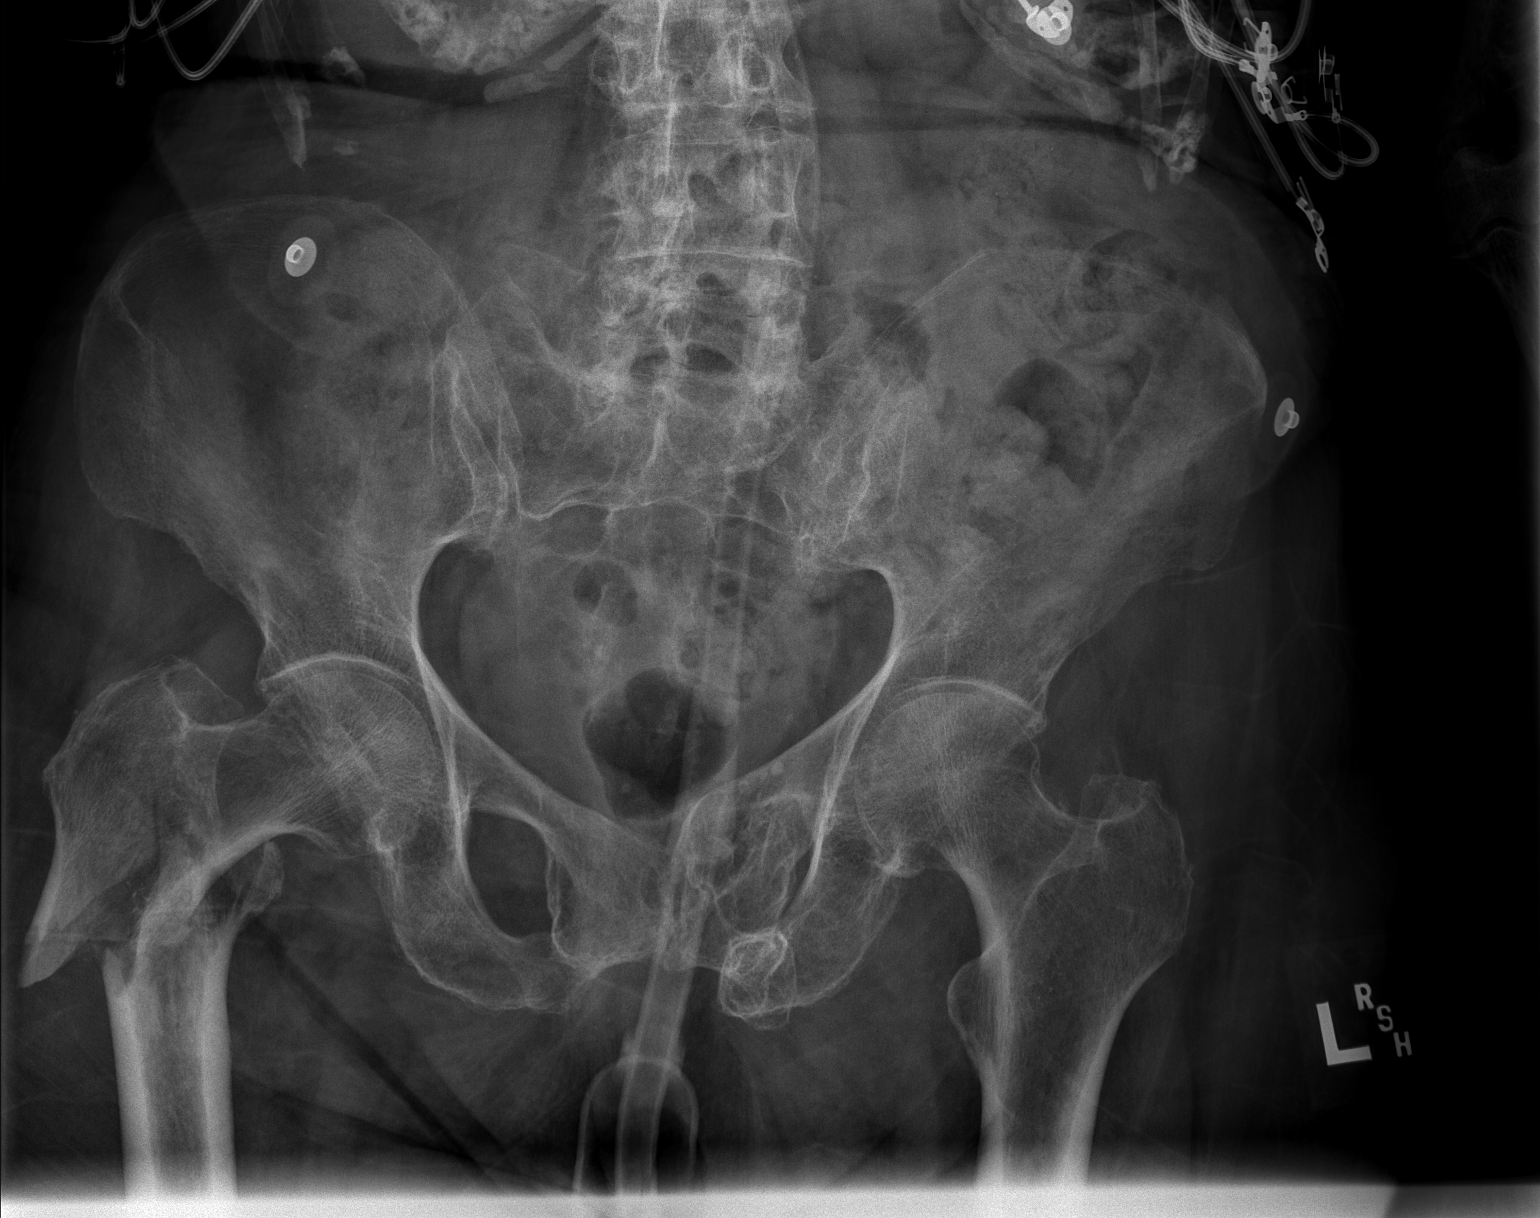

[t hip ap right]
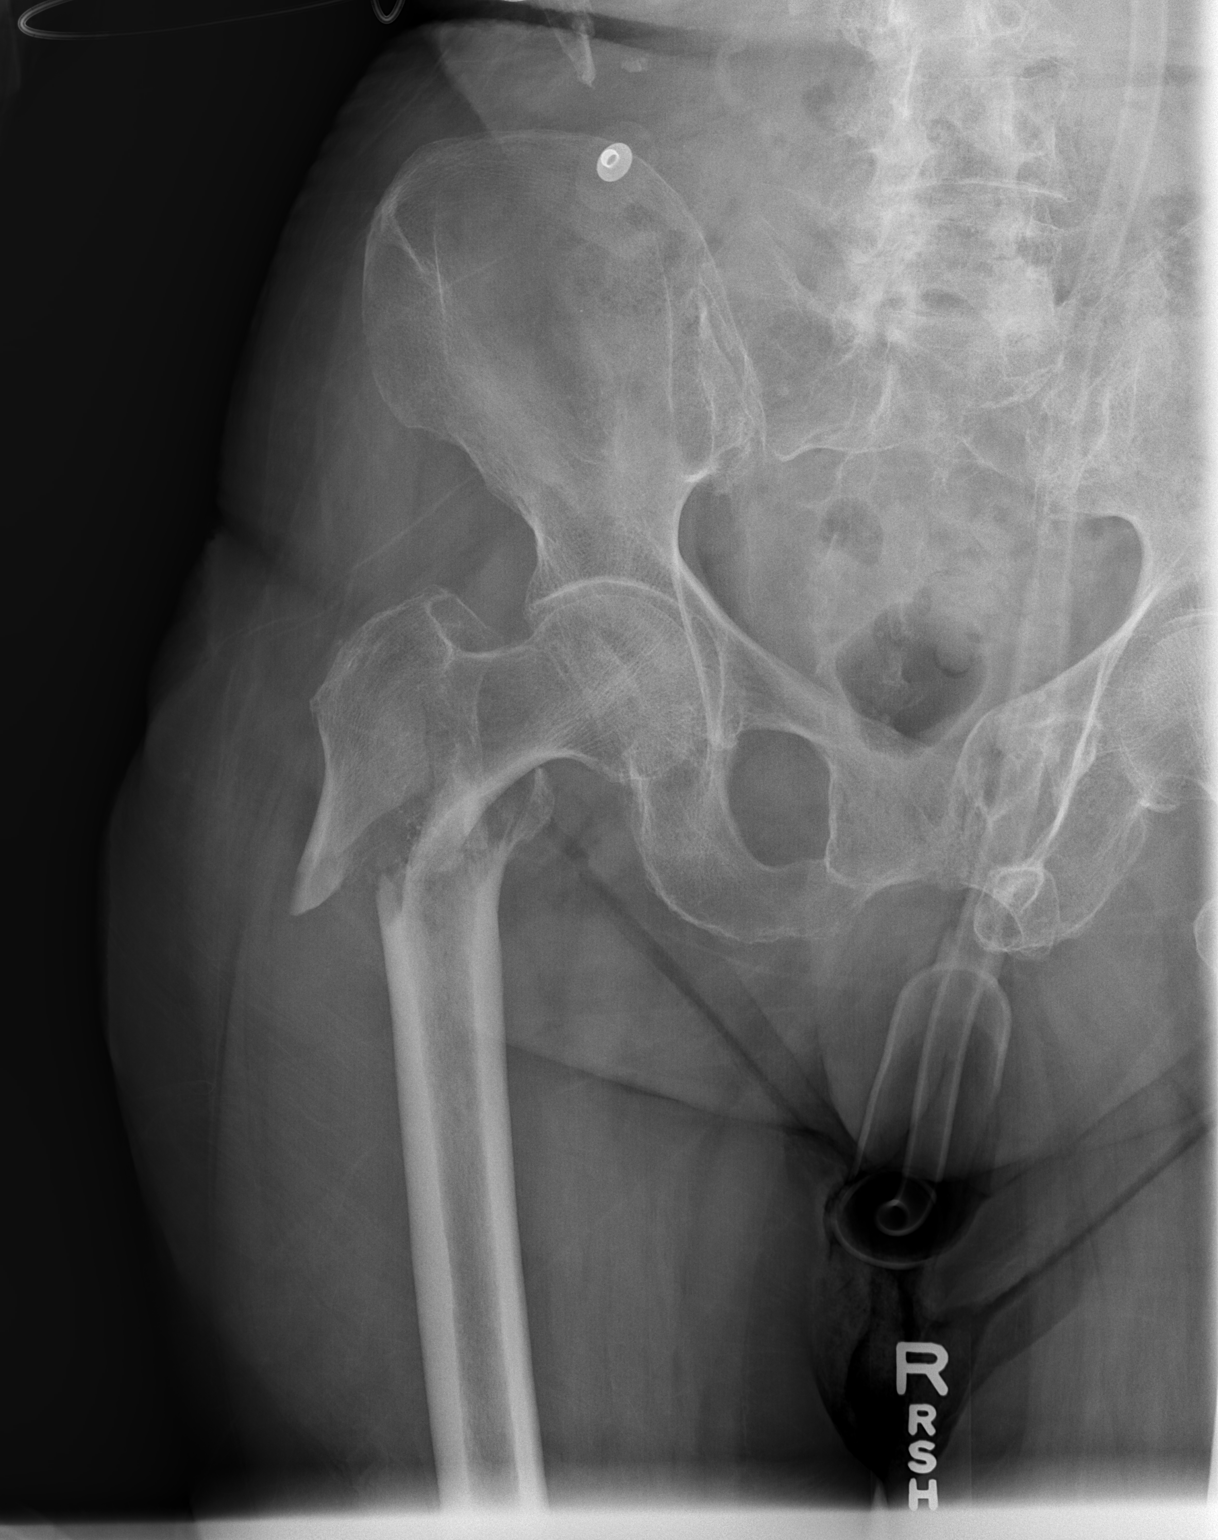

[x hip lat right]
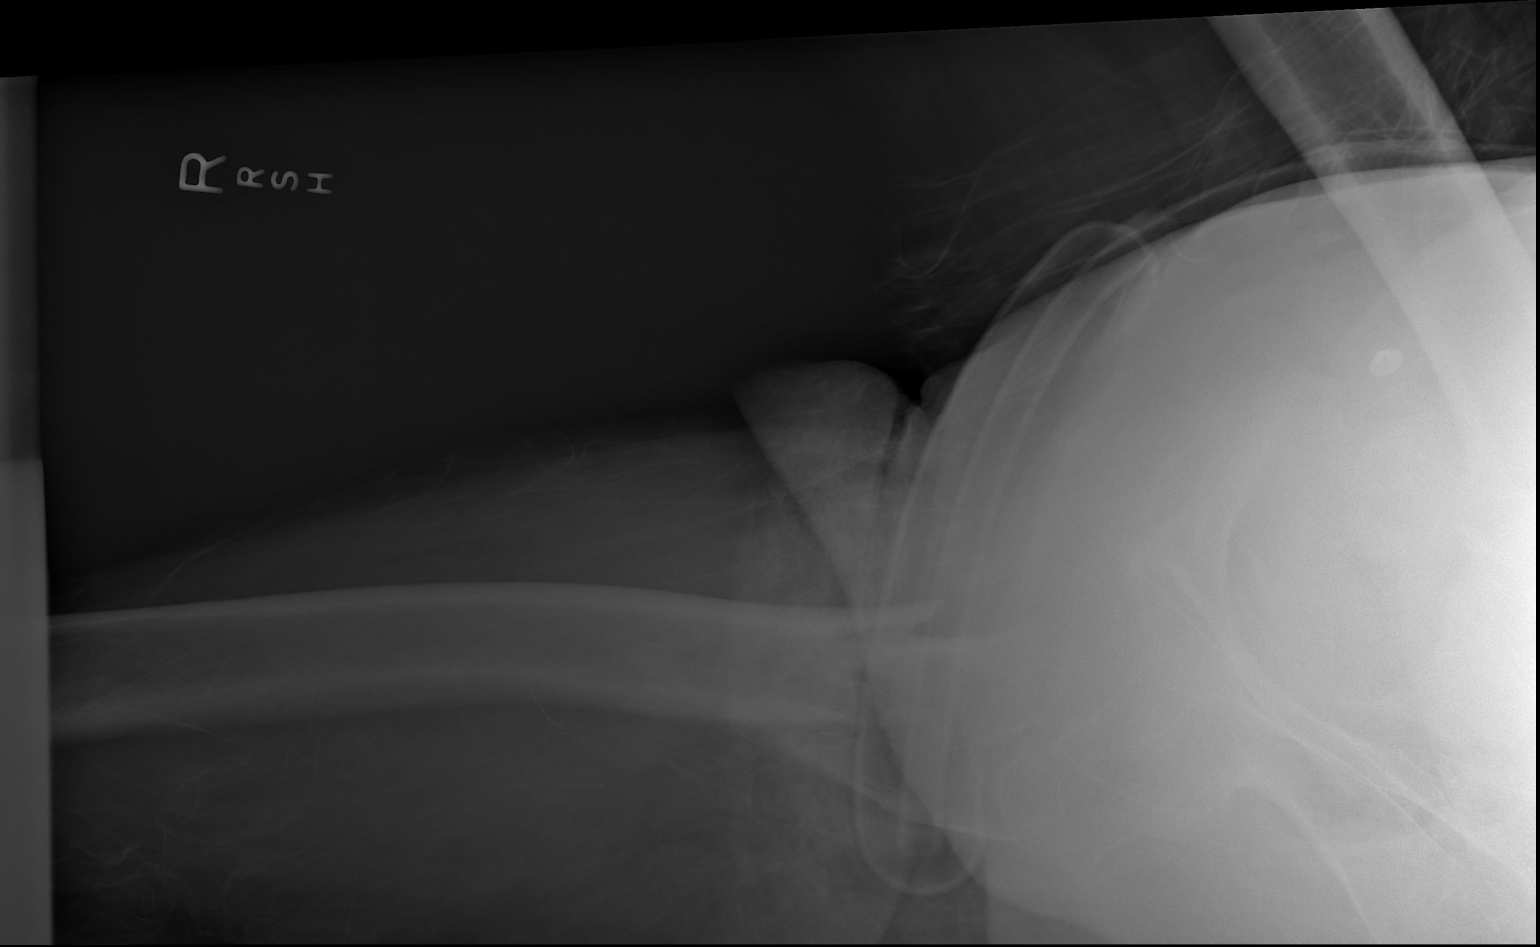

[3 of 3 positions shown; findings below may reference images not displayed]

FINDINGS: Highly comminuted right femur intertrochanteric fracture with varus
impaction. The right femoral head remains normally located.

Extensive chronic bilateral pubic symphysis fractures appear stable.
Grossly intact proximal left femur. No other acute osseous
abnormality identified about the pelvis. Negative visible bowel gas
pattern.
IMPRESSION: 1. Highly comminuted right femur intertrochanteric fracture with
varus impaction.
2. Chronic bilateral pubic symphysis fractures.

## 2021-06-02 IMAGING — DX DG LUMBAR SPINE COMPLETE 4+V
5 series · 5 of 5 positions shown · non-contrast
Comparison: Lumbar spine and chest CT 03/08/2020.

CLINICAL DATA: [AGE] female status post fall at home. Back
pain.

EXAM:
LUMBAR SPINE - COMPLETE 4+ VIEW

[t lumbar spine ap]
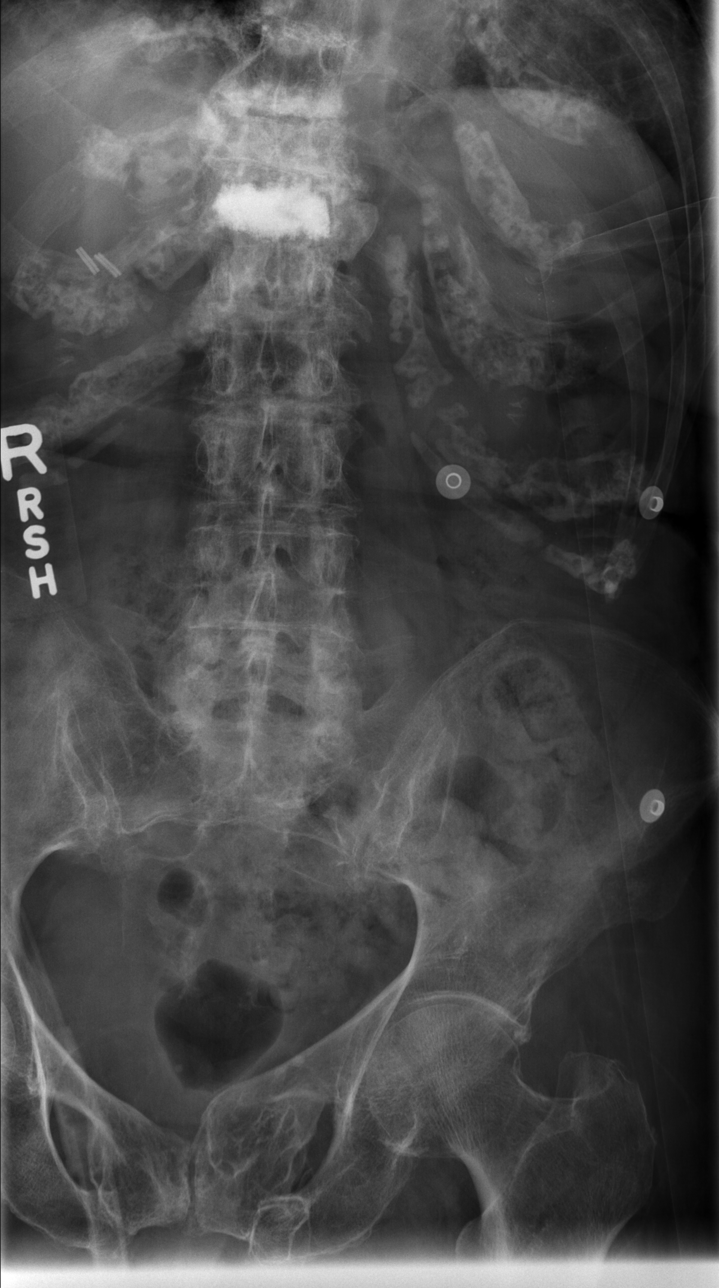

[t lumbar spine obl (1 of 2)]
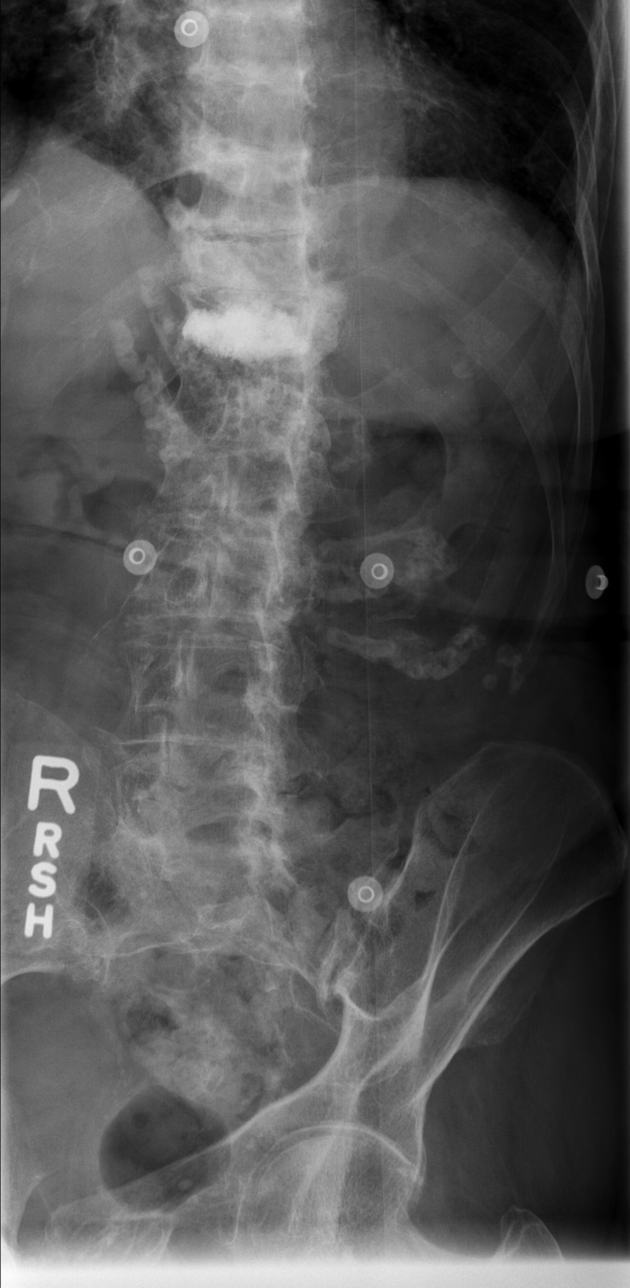

[t lumbar spine obl (2 of 2)]
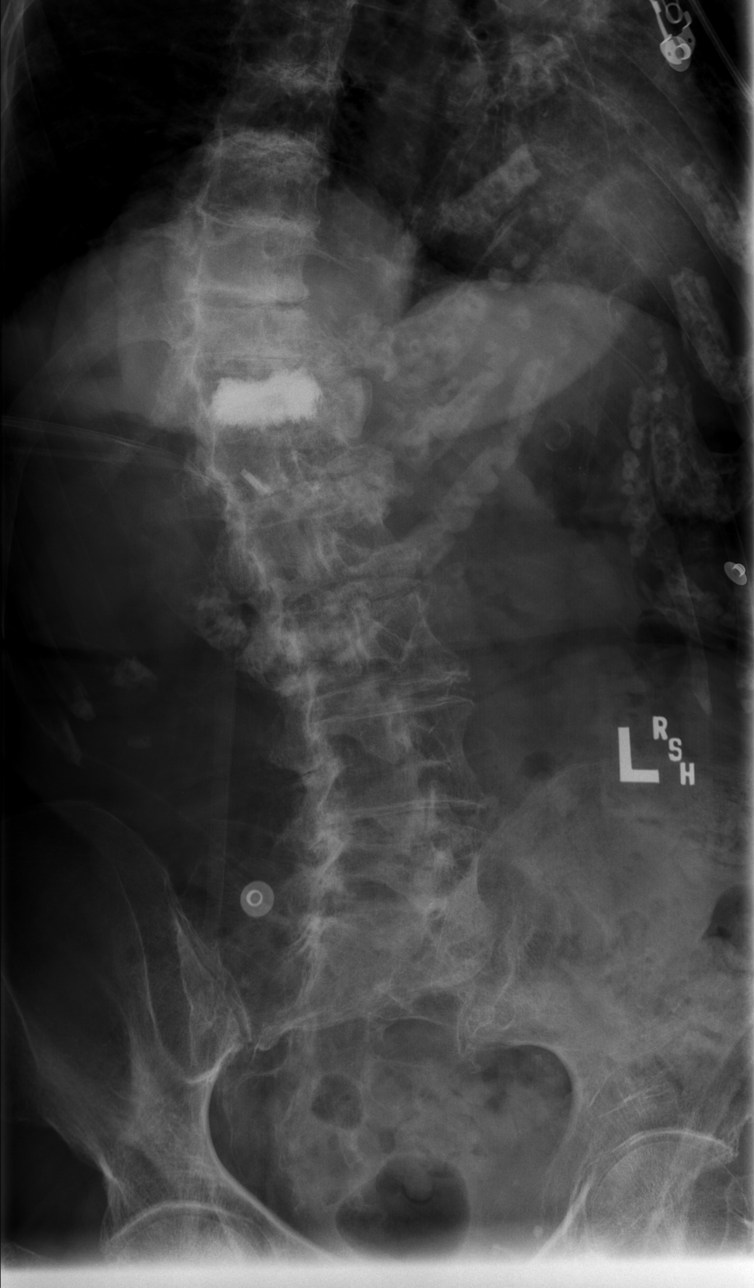

[t lumbar spine lat]
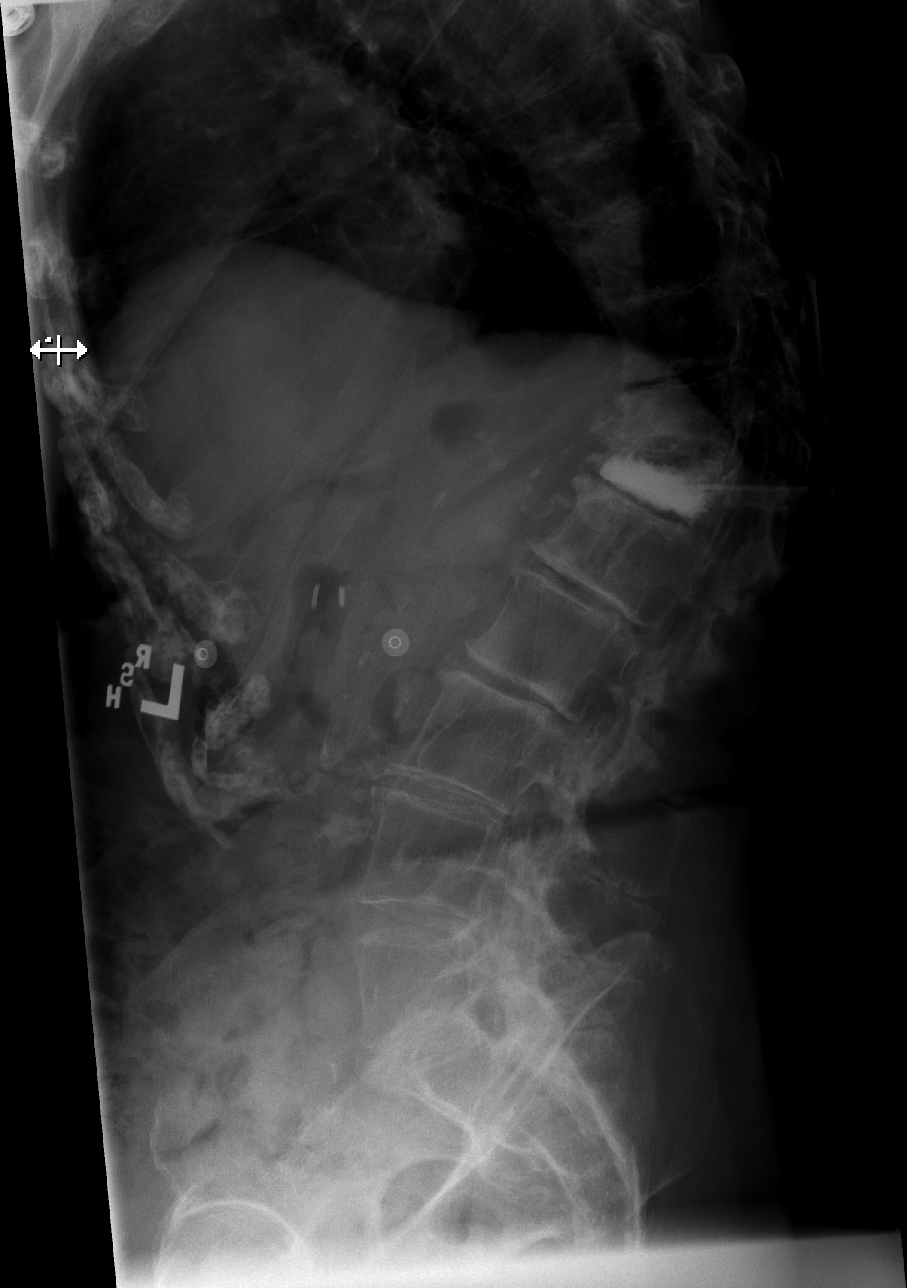

[t lumbar l-5 s-1 spot]
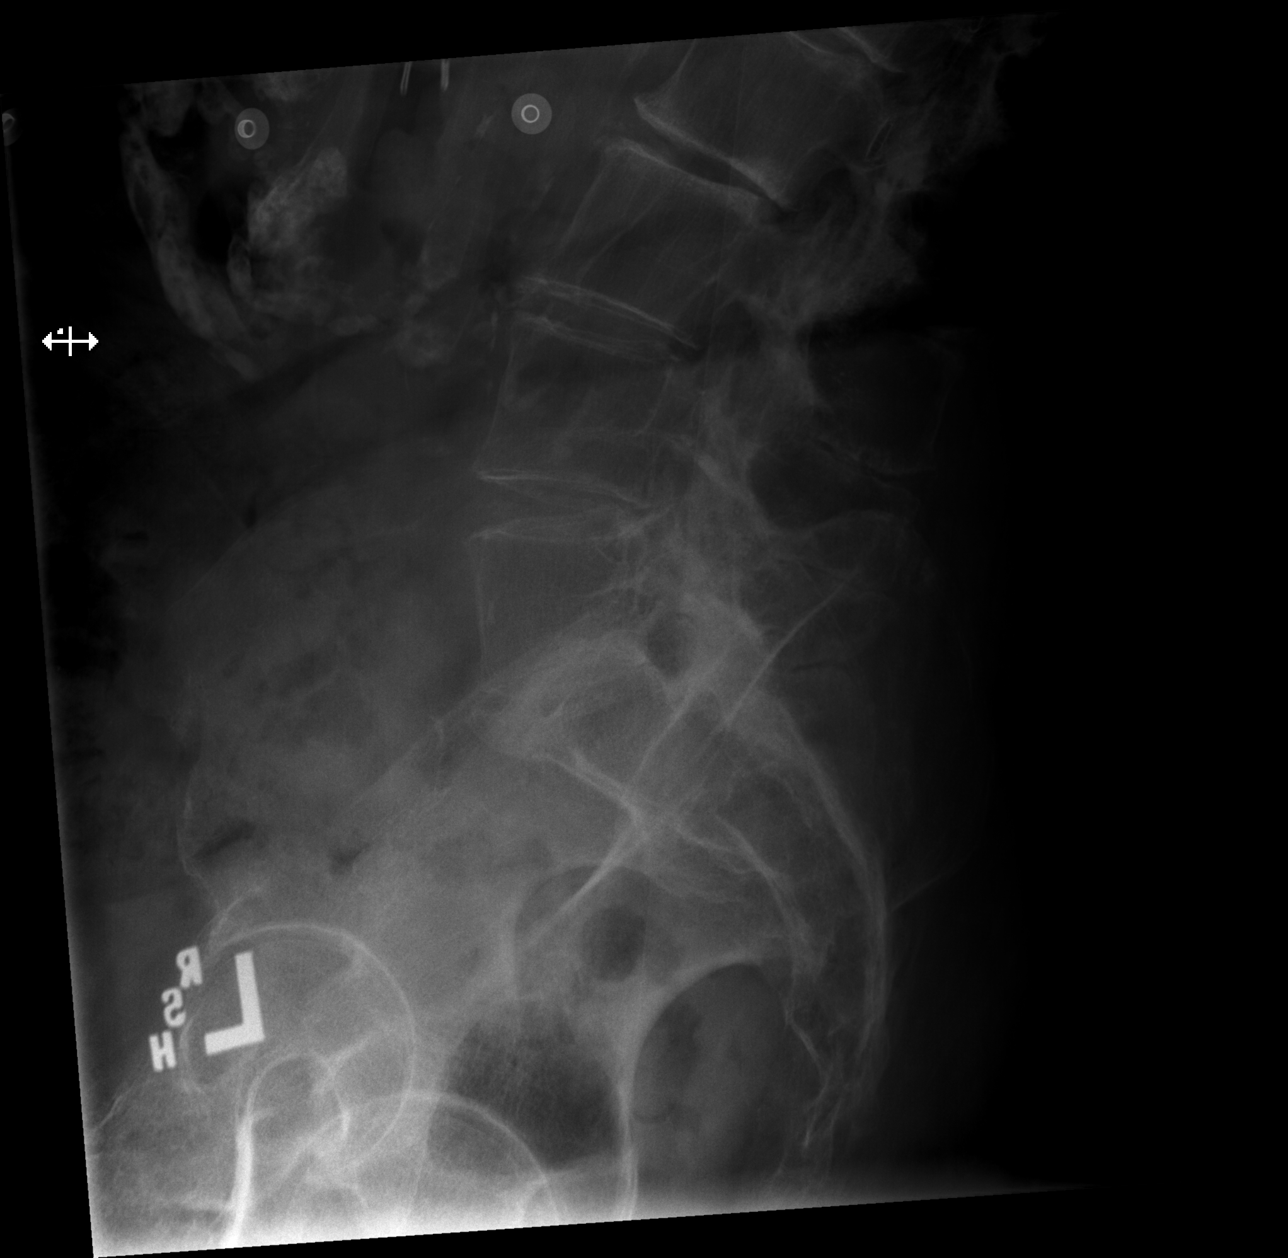

[5 of 5 positions shown; findings below may reference images not displayed]

FINDINGS: Chronic T11 and T12 vertebral fractures with progressive kyphotic
deformity since last year. Previously augmented T12 level as before.
No associated spondylolisthesis is evident.

L1 through L5 appear stable and intact. L1 benign vertebral body
hemangioma better demonstrated by CT. Stable lumbar lordosis.
Chronic lumbar disc and endplate degeneration maximal at L1-L2.

Other visible thoracic levels appear grossly intact. Grossly stable
visible pelvis. Stable cholecystectomy clips. Calcified aortic
atherosclerosis.
IMPRESSION: 1. No acute osseous abnormality identified in the lumbar spine.
2. Expected evolution of the T11 and T12 vertebral fractures since
last year with associated progressive kyphosis there.
3. Chronic lumbar spine degeneration. Aortic Atherosclerosis
([id number]).

## 2021-06-02 IMAGING — DX DG CHEST 1V
1 series · 1 of 1 positions shown · non-contrast
Comparison: None.

CLINICAL DATA: Fall.

EXAM:
CHEST  1 VIEW

[x chest ap]
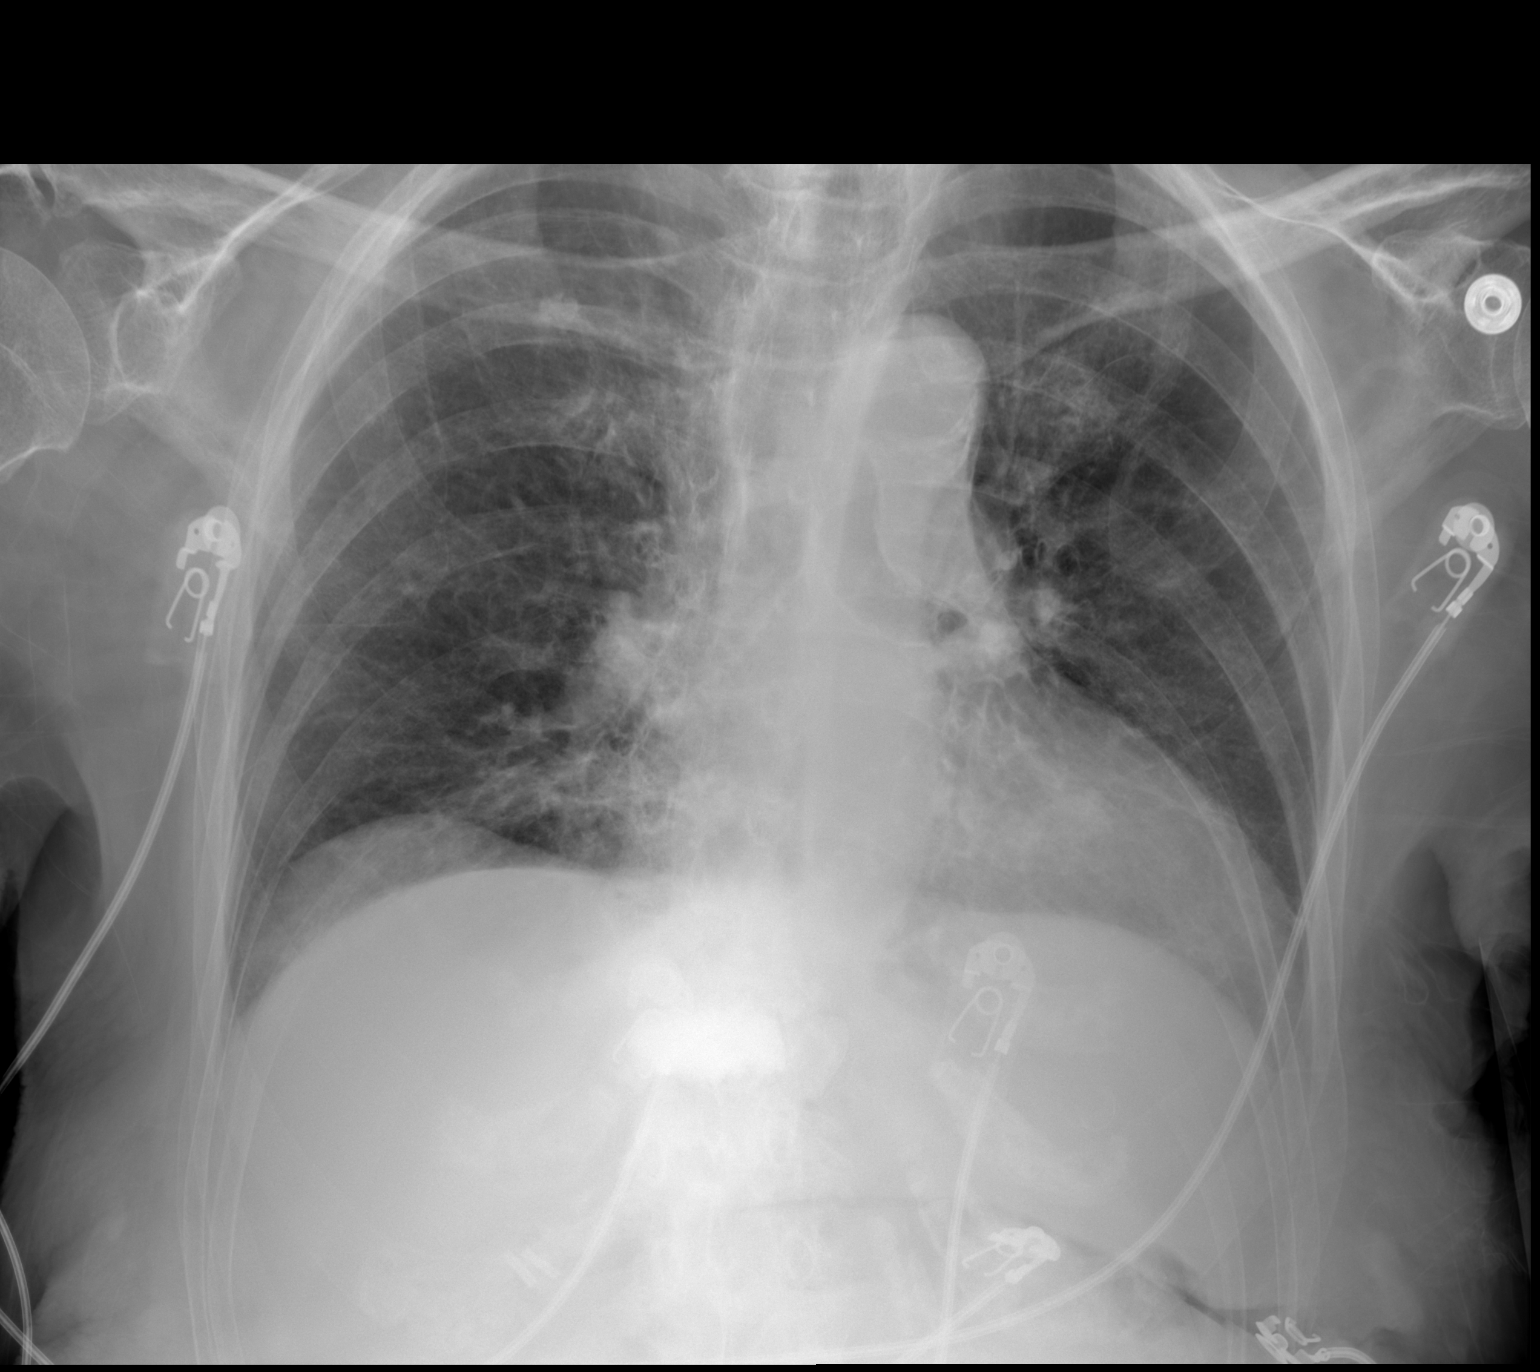

[1 of 1 positions shown; findings below may reference images not displayed]

FINDINGS: Similar cardiomediastinal silhouette. Aortic atherosclerosis. Both
lungs are clear. No visible pleural effusions or pneumothorax on
this single AP radiograph. No evidence of acute osseous abnormality.
Polyarticular degenerative change.
IMPRESSION: No evidence of acute cardiopulmonary disease.

## 2021-06-03 IMAGING — DX DG PORTABLE PELVIS
1 series · 1 of 1 positions shown · non-contrast
Comparison: None.

CLINICAL DATA: Postop.

EXAM:
PORTABLE PELVIS 1-2 VIEWS; RIGHT FEMUR PORTABLE 2 VIEW

[pelvis ap]
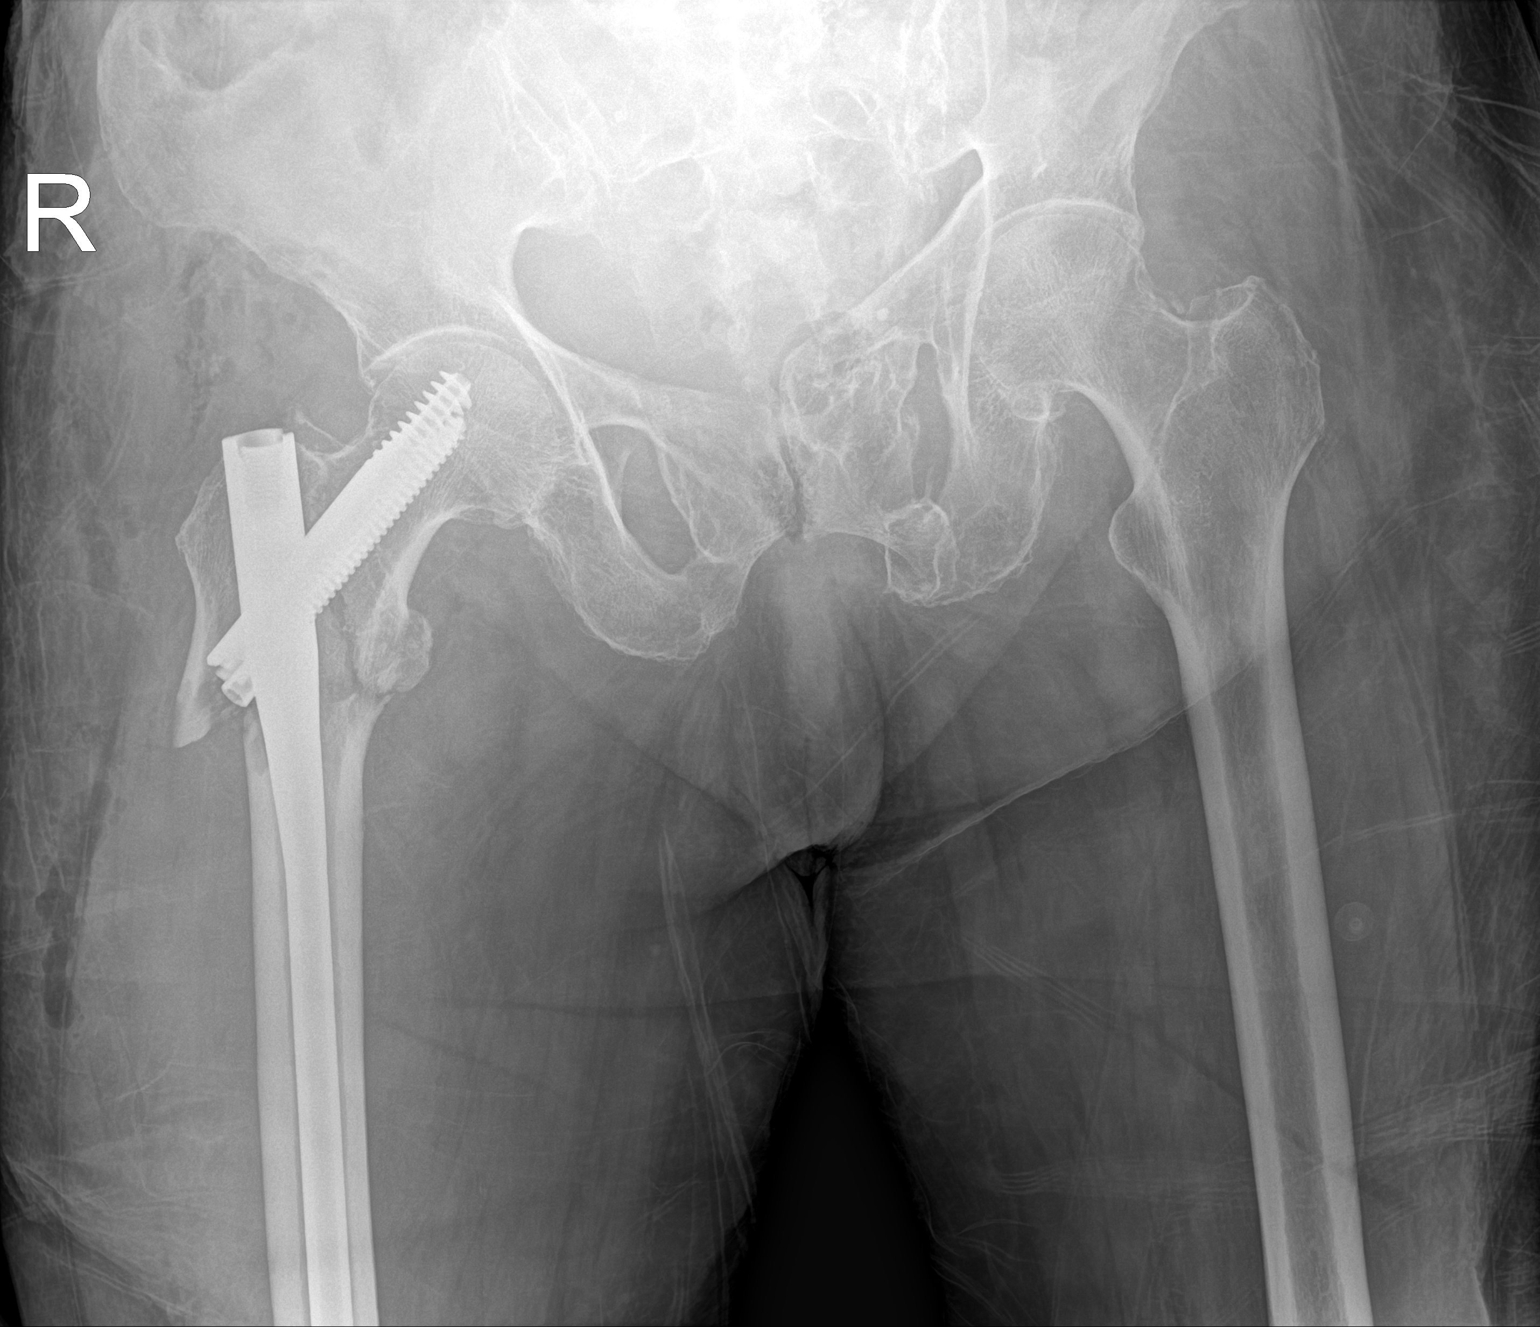

[1 of 1 positions shown; findings below may reference images not displayed]

FINDINGS: AP view of the pelvis with AP and lateral views of the right femur.
Intramedullary rod with trans trochanteric and distal locking screw
fixation of intertrochanteric femur fracture. Decreased angulation
of femur fracture from preoperative radiographs. No acute
periprosthetic lucency. Recent postsurgical change includes air and
edema in the soft tissues. Remote left pubic rami fractures. Left
knee arthroplasty.
IMPRESSION: Intramedullary rod with trans trochanteric and distal locking screw
fixation of intertrochanteric right femur fracture, with improved
alignment from preoperative radiographs. No immediate postoperative
complication.

## 2021-06-03 IMAGING — RF DG C-ARM 1-60 MIN
1 series · 7 of 7 positions shown · non-contrast
Comparison: Preoperative radiographs yesterday.

CLINICAL DATA: Right femur fraction fixation.

EXAM:
RIGHT FEMUR 2 VIEWS; DG C-ARM 1-60 MIN

[Series 1: run · 7 of 7 slices shown]
[im 1/7]
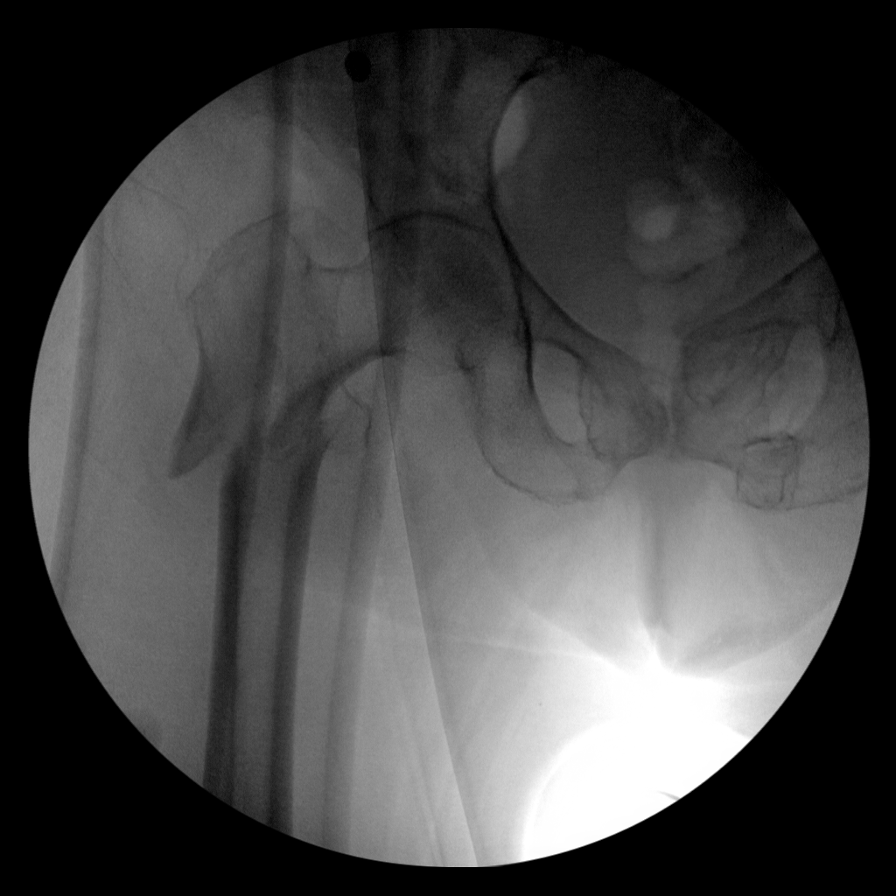
[im 2/7]
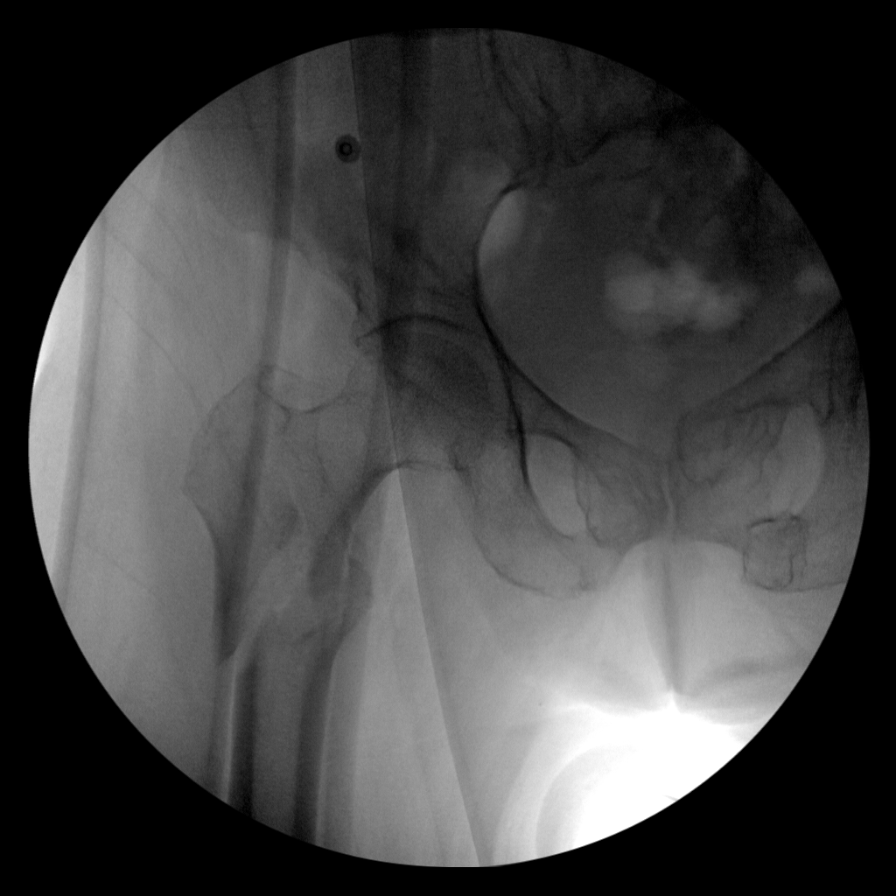
[im 3/7]
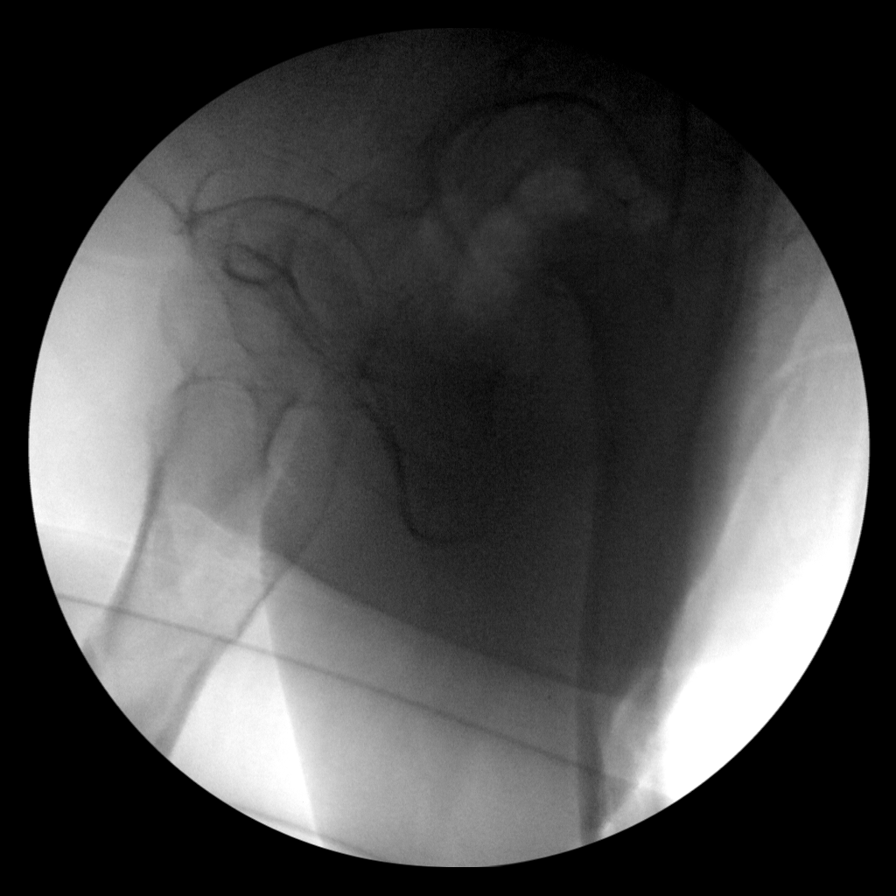
[im 4/7]
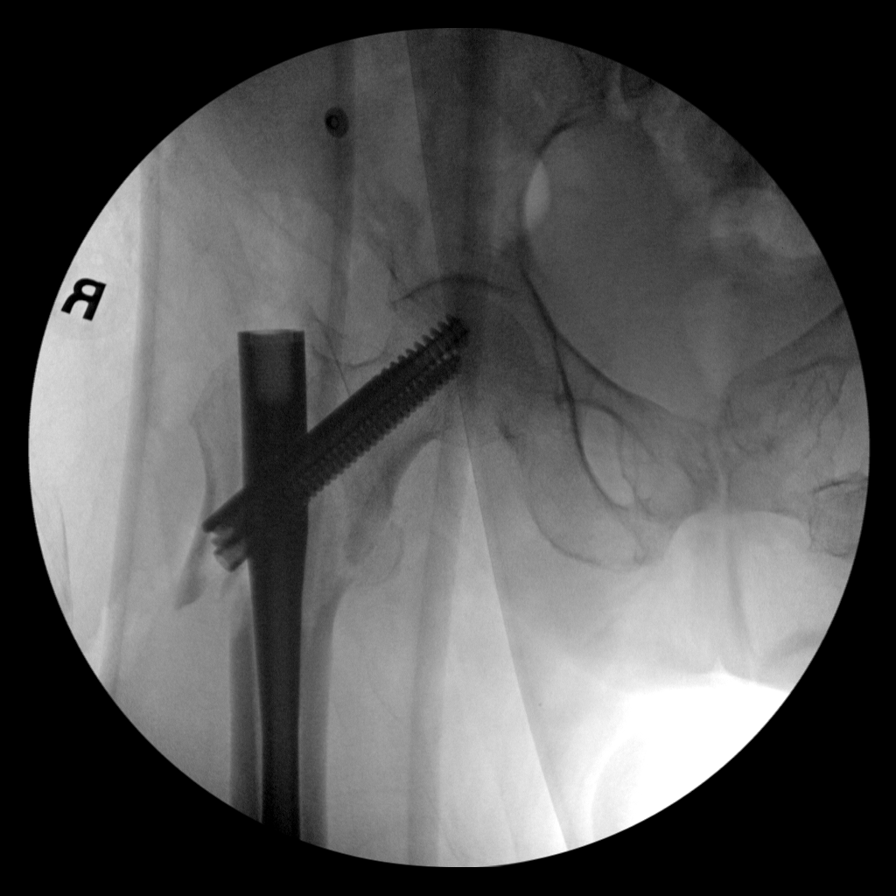
[im 5/7]
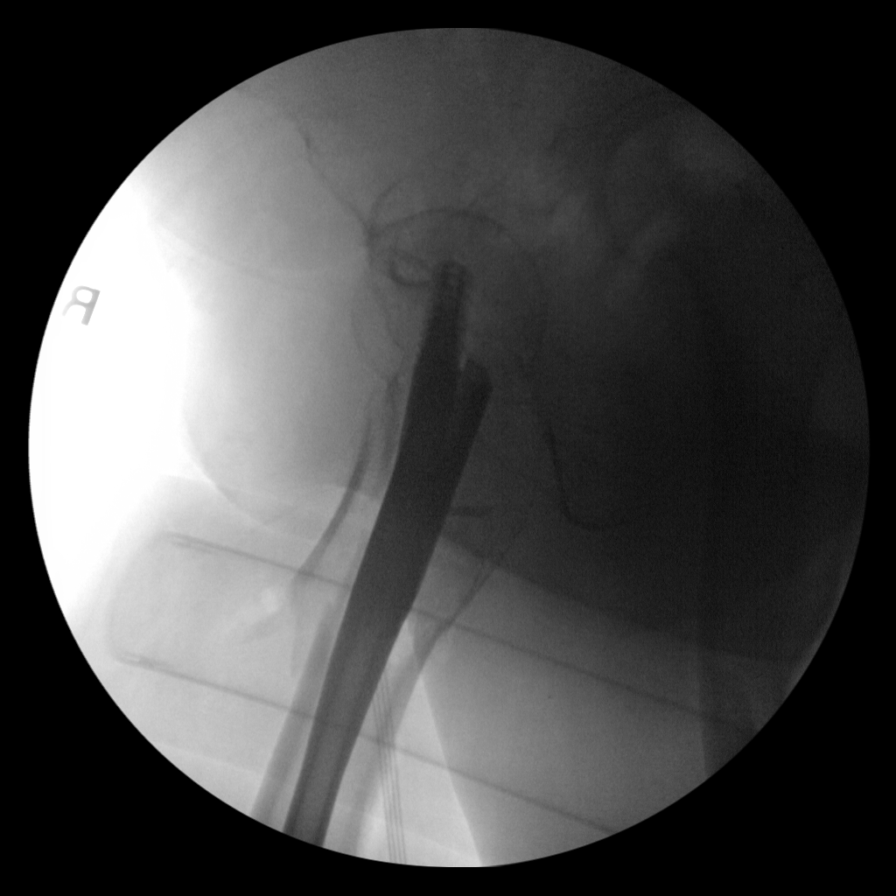
[im 6/7]
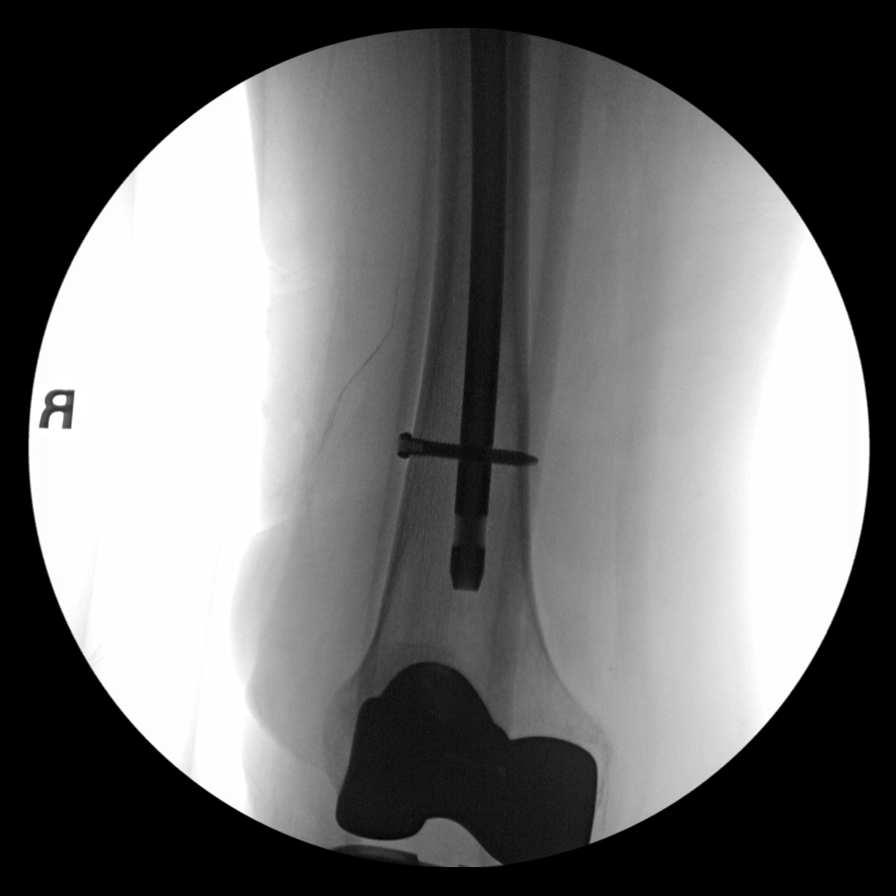
[im 7/7]
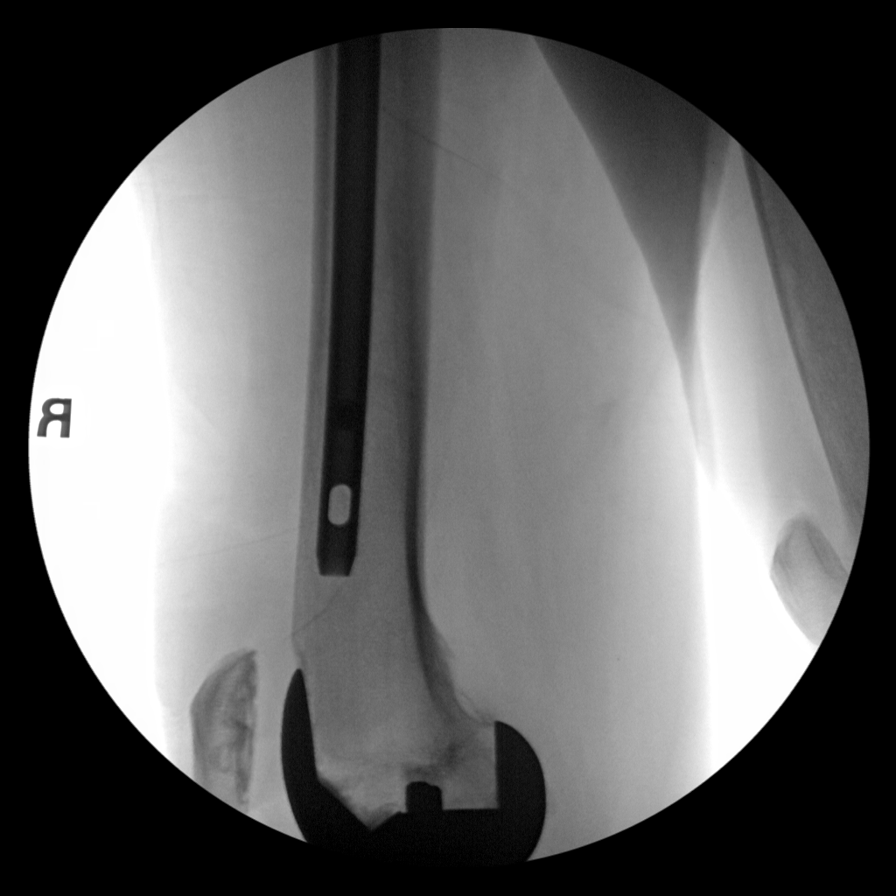

[7 of 7 positions shown; findings below may reference images not displayed]

FINDINGS: Seven fluoroscopic spot views of the right femur obtained in the
operating room. Intramedullary nail with trans trochanteric and
distal locking screws traverse intertrochanteric femur fracture.
Right knee arthroplasty is partially visualized. Total fluoroscopy
time 1 minutes 32 seconds. Total dose 14.6 mGy.
IMPRESSION: Intraoperative fluoroscopy during right femur fracture ORIF.

## 2021-06-14 NOTE — H&P (Signed)
Orthopaedic Trauma Service (OTS) H&P  Patient ID: [name] MRN: [id number] DOB/AGE: [birth year] 87 y.o.  Reason for Surgery: Hardware removal right hip  HPI: [name] is an 87 y.o. female presenting for hardware removal from right hip.  Patient underwent cephalomedullary nailing of right intertrochanteric femur fracture by Dr. Doreatha Martin on 02/01/2021 after sustaining a ground-level fall.  Acute postoperative course was uneventful but over the last 4.5 months the patient's hardware has cut out and is violating the hip joint, as seen on x-ray.  This is now causing pain with any motion of the hip. We initially attempted conservative management with the use of anti-inflammatory medications but the patient continues to be bothered by the hardware.  She presents today for removal of the proximal hardware in an attempt to alleviate the pain with hip motion.  Past Medical History:  Diagnosis Date   Anemia    Anxiety    Arthritis    "all over" (04/23/2017)   Cataract 2015   bilateral cat. extr.   Depression    GERD (gastroesophageal reflux disease)    Heart murmur    High cholesterol    History of pelvic fracture 03/2016   Hypertension    Macular degeneration of both eyes    Migraine    "started when I was in the 9th grade; lasted a couple years; started back in my 80's; had one daily right before OR" (04/23/2017)   Osteoporosis    Type II diabetes mellitus ([place])    "took Metformin for awhile; made me sick; diabetes is diet controlled" (04/23/2017)    Past Surgical History:  Procedure Laterality Date   ABDOMINAL HYSTERECTOMY     APPENDECTOMY     BREAST CYST EXCISION Bilateral    "all benign"   CARPAL TUNNEL RELEASE Right    CATARACT EXTRACTION W/ INTRAOCULAR LENS  IMPLANT, BILATERAL Bilateral    DILATION AND CURETTAGE OF UTERUS     INTRAMEDULLARY (IM) NAIL INTERTROCHANTERIC Right 02/01/2021   Procedure: INTRAMEDULLARY (IM) NAIL INTERTROCHANTRIC;  Surgeon: Shona Needles, MD;   Location: [place];  Service: Orthopedics;  Laterality: Right;   JOINT REPLACEMENT     KYPHOPLASTY     LAPAROSCOPIC CHOLECYSTECTOMY     TONSILLECTOMY AND ADENOIDECTOMY     as child   TOTAL KNEE ARTHROPLASTY Right 04/22/2017   TOTAL KNEE ARTHROPLASTY Right 04/22/2017   Procedure: TOTAL KNEE ARTHROPLASTY;  Surgeon: Vickey Huger, MD;  Location: [place];  Service: Orthopedics;  Laterality: Right;    Family History  Problem Relation Age of Onset   Colon cancer Father    Diabetes Father    Pancreatic cancer Father    Colon cancer Sister     Social History:  reports that she has never smoked. She has never used smokeless tobacco. She reports that she does not currently use alcohol. She reports that she does not use drugs.  Allergies:  Allergies  Allergen Reactions   Sulfamethoxazole Diarrhea and Other (See Comments)    GI UPSET    Medications: I have reviewed the patient's current medications. Prior to Admission:  No medications prior to admission.    ROS: Constitutional: No fever or chills Vision: No changes in vision ENT: No difficulty swallowing CV: No chest pain Pulm: No SOB or wheezing GI: No nausea or vomiting GU: No urgency or inability to hold urine Skin: No poor wound healing Neurologic: No numbness or tingling Psychiatric: No depression or anxiety Heme: No bruising Allergic: No reaction to medications or  food   Exam: There were no vitals taken for this visit. General: No acute distress Orientation: Alert and conversant Mood and Affect: Mood and affect appropriate Gait: Able to assist with pivot transfers Coordination and balance: Within normal limits  Right lower extremity: Well-healed surgical incisions over the lateral hip and thigh.  Nontender over the lateral hip or throughout the thigh, knee, lower leg.  Pain noted in the groin with active or passive hip motion.  Able to flex and extend the knee without significant discomfort.  Sensation intact throughout  extremity.  Extremity warm and dry.  Neurovascularly intact  Left lower extremity: Skin without lesions. No tenderness to palpation. Full painless ROM.  Motor and sensory function grossly intact.  Neurovascularly intact.  Medical Decision Making: Data: Imaging: AP and lateral views of the right hip shows cephalomedullary nail in place with proximal screws that have cut out into the hip joint.  Distal screws are stable.  No other signs of hardware failure/loosening  Labs: No results found for this or any previous visit (from the past 168 hour(s)).   Assessment/Plan: 87 year old female status post intramedullary nail right intertrochanteric femur fracture 02/01/2021, now presenting for hardware removal  Patient is having pain with motion of the hip due to screws violating the hip joint.  This is limiting her ability to perform daily activities.  Would recommend proceeding with removal of proximal screws in an attempt to alleviate pain in the groin with hip motion.  Risk and benefits of procedure were discussed with the patient and her family.  They all agree to proceed with surgery.  Verbal consent has been obtained and written consent will be obtained and placed in the chart.  We will plan to admit patient postoperatively for pain control and therapies.   [name] A. Carmie Kanner [facility] [phone] (office) [email or web address]

## 2021-06-15 ENCOUNTER — Other Ambulatory Visit: Payer: Self-pay

## 2021-06-15 ENCOUNTER — Encounter (HOSPITAL_COMMUNITY): Payer: Self-pay | Admitting: Student

## 2021-06-16 ENCOUNTER — Other Ambulatory Visit: Payer: Self-pay

## 2021-06-16 ENCOUNTER — Ambulatory Visit (HOSPITAL_COMMUNITY): Payer: Medicare Other

## 2021-06-16 ENCOUNTER — Encounter (HOSPITAL_COMMUNITY): Payer: Self-pay | Admitting: Student

## 2021-06-16 ENCOUNTER — Observation Stay (HOSPITAL_COMMUNITY)
Admission: RE | Admit: 2021-06-16 | Discharge: 2021-06-17 | Disposition: A | Discharge status: Home / Self Care | Payer: Medicare Other | Attending: Student | Admitting: Student

## 2021-06-16 ENCOUNTER — Ambulatory Visit (HOSPITAL_COMMUNITY): Payer: Medicare Other | Admitting: Anesthesiology

## 2021-06-16 ENCOUNTER — Encounter (HOSPITAL_COMMUNITY)
Admission: RE | Disposition: A | Discharge status: Home / Self Care | Payer: Self-pay | Source: Home / Self Care | Attending: Student

## 2021-06-16 DIAGNOSIS — Z96651 Presence of right artificial knee joint: Secondary | ICD-10-CM | POA: Insufficient documentation

## 2021-06-16 DIAGNOSIS — Y792 Prosthetic and other implants, materials and accessory orthopedic devices associated with adverse incidents: Secondary | ICD-10-CM | POA: Insufficient documentation

## 2021-06-16 DIAGNOSIS — Z20822 Contact with and (suspected) exposure to covid-19: Secondary | ICD-10-CM | POA: Insufficient documentation

## 2021-06-16 DIAGNOSIS — Z419 Encounter for procedure for purposes other than remedying health state, unspecified: Secondary | ICD-10-CM

## 2021-06-16 DIAGNOSIS — E119 Type 2 diabetes mellitus without complications: Secondary | ICD-10-CM | POA: Diagnosis not present

## 2021-06-16 DIAGNOSIS — I10 Essential (primary) hypertension: Secondary | ICD-10-CM | POA: Diagnosis not present

## 2021-06-16 DIAGNOSIS — T8484XA Pain due to internal orthopedic prosthetic devices, implants and grafts, initial encounter: Principal | ICD-10-CM | POA: Insufficient documentation

## 2021-06-16 DIAGNOSIS — S72009A Fracture of unspecified part of neck of unspecified femur, initial encounter for closed fracture: Secondary | ICD-10-CM | POA: Insufficient documentation

## 2021-06-16 HISTORY — DX: Unspecified dementia, unspecified severity, without behavioral disturbance, psychotic disturbance, mood disturbance, and anxiety: F03.90

## 2021-06-16 HISTORY — PX: HARDWARE REMOVAL: SHX979

## 2021-06-16 LAB — CBC
HCT: 35.1 % — ABNORMAL LOW (ref 36.0–46.0)
Hemoglobin: 10.6 g/dL — ABNORMAL LOW (ref 12.0–15.0)
MCH: 28.7 pg (ref 26.0–34.0)
MCHC: 30.2 g/dL (ref 30.0–36.0)
MCV: 95.1 fL (ref 80.0–100.0)
Platelets: 402 10*3/uL — ABNORMAL HIGH (ref 150–400)
RBC: 3.69 MIL/uL — ABNORMAL LOW (ref 3.87–5.11)
RDW: 15.7 % — ABNORMAL HIGH (ref 11.5–15.5)
WBC: 6.2 10*3/uL (ref 4.0–10.5)
nRBC: 0 % (ref 0.0–0.2)

## 2021-06-16 LAB — GLUCOSE, CAPILLARY
Glucose-Capillary: 89 mg/dL (ref 70–99)
Glucose-Capillary: 87 mg/dL (ref 70–99)
Glucose-Capillary: 84 mg/dL (ref 70–99)

## 2021-06-16 LAB — BASIC METABOLIC PANEL
Anion gap: 8 (ref 5–15)
BUN: 16 mg/dL (ref 8–23)
CO2: 26 mmol/L (ref 22–32)
Calcium: 9.1 mg/dL (ref 8.9–10.3)
Chloride: 103 mmol/L (ref 98–111)
Creatinine, Ser: 0.72 mg/dL (ref 0.44–1.00)
GFR, Estimated: >60 mL/min (ref >60)
Glucose, Bld: 88 mg/dL (ref 70–99)
Potassium: 4.3 mmol/L (ref 3.5–5.1)
Sodium: 137 mmol/L (ref 135–145)

## 2021-06-16 LAB — SARS CORONAVIRUS 2 BY RT PCR (HOSPITAL ORDER, PERFORMED IN ~~LOC~~ HOSPITAL LAB): SARS Coronavirus 2: NEGATIVE

## 2021-06-16 SURGERY — REMOVAL, HARDWARE
Anesthesia: Monitor Anesthesia Care | Laterality: Right

## 2021-06-16 MED ORDER — ONDANSETRON HCL 4 MG PO TABS: 4.0000 mg | ORAL_TABLET | Freq: Four times a day (QID) | ORAL | Status: DC | PRN

## 2021-06-16 MED ORDER — DICLOFENAC SODIUM 1 % EX GEL
2.0000 g | Freq: Every evening | CUTANEOUS | Status: DC | PRN
  Filled 2021-06-16: qty 100

## 2021-06-16 MED ORDER — TRAMADOL HCL 50 MG PO TABS
50.0000 mg | ORAL_TABLET | Freq: Every day | ORAL | Status: DC
Start: 2021-06-16 — End: 2021-06-17
  Administered 2021-06-16: 50 mg via ORAL
  Filled 2021-06-16: qty 1

## 2021-06-16 MED ORDER — MIDAZOLAM HCL 2 MG/2ML IJ SOLN
INTRAMUSCULAR | Status: AC
  Filled 2021-06-16: qty 2

## 2021-06-16 MED ORDER — FENTANYL CITRATE (PF) 100 MCG/2ML IJ SOLN
INTRAMUSCULAR | Status: DC | PRN
  Administered 2021-06-16 (×2): 25 ug via INTRAVENOUS

## 2021-06-16 MED ORDER — FENTANYL CITRATE (PF) 250 MCG/5ML IJ SOLN
INTRAMUSCULAR | Status: AC
  Filled 2021-06-16: qty 5

## 2021-06-16 MED ORDER — CHLORHEXIDINE GLUCONATE 0.12 % MT SOLN
15.0000 mL | Freq: Once | OROMUCOSAL | Status: AC
  Administered 2021-06-16: 15 mL via OROMUCOSAL
  Filled 2021-06-16: qty 15

## 2021-06-16 MED ORDER — TRANEXAMIC ACID-NACL 1000-0.7 MG/100ML-% IV SOLN
1000.0000 mg | INTRAVENOUS | Status: AC
  Administered 2021-06-16: 1000 mg via INTRAVENOUS
  Filled 2021-06-16: qty 100

## 2021-06-16 MED ORDER — PANTOPRAZOLE SODIUM 40 MG PO TBEC
40.0000 mg | DELAYED_RELEASE_TABLET | Freq: Every day | ORAL | Status: DC
  Administered 2021-06-17: 40 mg via ORAL
  Filled 2021-06-16 (×2): qty 1

## 2021-06-16 MED ORDER — LISINOPRIL 5 MG PO TABS
5.0000 mg | ORAL_TABLET | Freq: Every day | ORAL | Status: DC
  Administered 2021-06-16 – 2021-06-17 (×2): 5 mg via ORAL
  Filled 2021-06-16 (×2): qty 1

## 2021-06-16 MED ORDER — DEXMEDETOMIDINE (PRECEDEX) IN NS 20 MCG/5ML (4 MCG/ML) IV SYRINGE
PREFILLED_SYRINGE | INTRAVENOUS | Status: DC | PRN
  Administered 2021-06-16 (×3): 4 ug via INTRAVENOUS

## 2021-06-16 MED ORDER — METOCLOPRAMIDE HCL 5 MG/ML IJ SOLN: 5.0000 mg | Freq: Three times a day (TID) | INTRAMUSCULAR | Status: DC | PRN

## 2021-06-16 MED ORDER — ASPIRIN EC 325 MG PO TBEC
325.0000 mg | DELAYED_RELEASE_TABLET | Freq: Every day | ORAL | Status: DC
  Administered 2021-06-16: 325 mg via ORAL
  Filled 2021-06-16: qty 1

## 2021-06-16 MED ORDER — POLYETHYL GLYCOL-PROPYL GLYCOL 0.4-0.3 % OP SOLN: 1.0000 [drp] | Freq: Every day | OPHTHALMIC | Status: DC

## 2021-06-16 MED ORDER — WHITE PETROLATUM EX OINT
TOPICAL_OINTMENT | CUTANEOUS | Status: AC
  Filled 2021-06-16: qty 28.35

## 2021-06-16 MED ORDER — SENNA 8.6 MG PO TABS: 1.0000 | ORAL_TABLET | Freq: Two times a day (BID) | ORAL | Status: DC | PRN

## 2021-06-16 MED ORDER — VANCOMYCIN HCL 1000 MG IV SOLR
INTRAVENOUS | Status: AC
  Filled 2021-06-16: qty 20

## 2021-06-16 MED ORDER — MELOXICAM 7.5 MG PO TABS
7.5000 mg | ORAL_TABLET | Freq: Every day | ORAL | Status: DC
  Administered 2021-06-16 – 2021-06-17 (×2): 7.5 mg via ORAL
  Filled 2021-06-16 (×2): qty 1

## 2021-06-16 MED ORDER — QUETIAPINE FUMARATE 50 MG PO TABS
50.0000 mg | ORAL_TABLET | Freq: Every day | ORAL | Status: DC
  Administered 2021-06-16: 50 mg via ORAL
  Filled 2021-06-16: qty 1

## 2021-06-16 MED ORDER — CEFAZOLIN SODIUM-DEXTROSE 2-4 GM/100ML-% IV SOLN
2.0000 g | INTRAVENOUS | Status: AC
  Administered 2021-06-17: 2 g via INTRAVENOUS
  Filled 2021-06-16: qty 100

## 2021-06-16 MED ORDER — MORPHINE SULFATE (PF) 2 MG/ML IV SOLN: 0.5000 mg | INTRAVENOUS | Status: DC | PRN

## 2021-06-16 MED ORDER — PHENYLEPHRINE HCL-NACL 20-0.9 MG/250ML-% IV SOLN
INTRAVENOUS | Status: DC | PRN
  Administered 2021-06-16: 40 ug/min via INTRAVENOUS

## 2021-06-16 MED ORDER — DEXMEDETOMIDINE (PRECEDEX) IN NS 20 MCG/5ML (4 MCG/ML) IV SYRINGE
PREFILLED_SYRINGE | INTRAVENOUS | Status: AC
  Filled 2021-06-16: qty 15

## 2021-06-16 MED ORDER — CEFAZOLIN SODIUM-DEXTROSE 2-4 GM/100ML-% IV SOLN
2.0000 g | INTRAVENOUS | Status: AC
  Administered 2021-06-16: 2 g via INTRAVENOUS
  Filled 2021-06-16: qty 100

## 2021-06-16 MED ORDER — VITAMIN D 25 MCG (1000 UNIT) PO TABS
1000.0000 [IU] | ORAL_TABLET | Freq: Every day | ORAL | Status: DC
  Administered 2021-06-16 – 2021-06-17 (×2): 1000 [IU] via ORAL
  Filled 2021-06-16 (×2): qty 1

## 2021-06-16 MED ORDER — MEPIVACAINE HCL (PF) 2 % IJ SOLN
INTRAMUSCULAR | Status: DC | PRN
  Administered 2021-06-16: 2.8 mL via INTRATHECAL

## 2021-06-16 MED ORDER — ATORVASTATIN CALCIUM 10 MG PO TABS
20.0000 mg | ORAL_TABLET | Freq: Every day | ORAL | Status: DC
  Administered 2021-06-16: 20 mg via ORAL
  Filled 2021-06-16: qty 2

## 2021-06-16 MED ORDER — HYDROCODONE-ACETAMINOPHEN 5-325 MG PO TABS: 1.0000 | ORAL_TABLET | Freq: Four times a day (QID) | ORAL | Status: DC | PRN

## 2021-06-16 MED ORDER — 0.9 % SODIUM CHLORIDE (POUR BTL) OPTIME
TOPICAL | Status: DC | PRN
  Administered 2021-06-16: 1000 mL

## 2021-06-16 MED ORDER — ONDANSETRON HCL 4 MG/2ML IJ SOLN: 4.0000 mg | Freq: Once | INTRAMUSCULAR | Status: DC | PRN

## 2021-06-16 MED ORDER — POLYVINYL ALCOHOL 1.4 % OP SOLN
1.0000 [drp] | Freq: Every day | OPHTHALMIC | Status: DC
  Administered 2021-06-16: 1 [drp] via OPHTHALMIC
  Filled 2021-06-16: qty 15

## 2021-06-16 MED ORDER — FENTANYL CITRATE (PF) 100 MCG/2ML IJ SOLN: 25.0000 ug | INTRAMUSCULAR | Status: DC | PRN

## 2021-06-16 MED ORDER — PAROXETINE HCL 10 MG PO TABS
10.0000 mg | ORAL_TABLET | Freq: Every day | ORAL | Status: DC
  Administered 2021-06-16: 10 mg via ORAL
  Filled 2021-06-16 (×2): qty 1

## 2021-06-16 MED ORDER — PROPOFOL 500 MG/50ML IV EMUL
INTRAVENOUS | Status: DC | PRN
  Administered 2021-06-16: 50 ug/kg/min via INTRAVENOUS

## 2021-06-16 MED ORDER — ACETAMINOPHEN 500 MG PO TABS
500.0000 mg | ORAL_TABLET | Freq: Four times a day (QID) | ORAL | Status: DC
  Administered 2021-06-16 – 2021-06-17 (×4): 500 mg via ORAL
  Filled 2021-06-16 (×4): qty 1

## 2021-06-16 MED ORDER — MAGNESIUM OXIDE -MG SUPPLEMENT 400 (240 MG) MG PO TABS
400.0000 mg | ORAL_TABLET | Freq: Every day | ORAL | Status: DC
  Administered 2021-06-16 – 2021-06-17 (×2): 400 mg via ORAL
  Filled 2021-06-16 (×2): qty 1

## 2021-06-16 MED ORDER — ORAL CARE MOUTH RINSE: 15.0000 mL | Freq: Once | OROMUCOSAL | Status: AC

## 2021-06-16 MED ORDER — POTASSIUM CHLORIDE IN NACL 20-0.9 MEQ/L-% IV SOLN: INTRAVENOUS | Status: DC

## 2021-06-16 MED ORDER — DOCUSATE SODIUM 100 MG PO CAPS
100.0000 mg | ORAL_CAPSULE | Freq: Two times a day (BID) | ORAL | Status: DC
  Administered 2021-06-16 – 2021-06-17 (×3): 100 mg via ORAL
  Filled 2021-06-16 (×3): qty 1

## 2021-06-16 MED ORDER — VITAMIN D3 25 MCG PO TABS
1000.0000 [IU] | ORAL_TABLET | Freq: Every day | ORAL | Status: DC
  Filled 2021-06-16: qty 1

## 2021-06-16 MED ORDER — METOCLOPRAMIDE HCL 5 MG PO TABS: 5.0000 mg | ORAL_TABLET | Freq: Three times a day (TID) | ORAL | Status: DC | PRN

## 2021-06-16 MED ORDER — MAGNESIUM 250 MG PO TABS: 250.0000 mg | ORAL_TABLET | Freq: Every day | ORAL | Status: DC

## 2021-06-16 MED ORDER — ACETAMINOPHEN 325 MG PO TABS
650.0000 mg | ORAL_TABLET | Freq: Once | ORAL | Status: AC
  Administered 2021-06-16: 650 mg via ORAL
  Filled 2021-06-16: qty 2

## 2021-06-16 MED ORDER — MIRTAZAPINE 15 MG PO TBDP
15.0000 mg | ORAL_TABLET | Freq: Every day | ORAL | Status: DC
  Administered 2021-06-16: 15 mg via ORAL
  Filled 2021-06-16 (×2): qty 1

## 2021-06-16 MED ORDER — ONDANSETRON HCL 4 MG/2ML IJ SOLN: 4.0000 mg | Freq: Four times a day (QID) | INTRAMUSCULAR | Status: DC | PRN

## 2021-06-16 MED ORDER — LACTATED RINGERS IV SOLN: INTRAVENOUS | Status: DC

## 2021-06-16 MED ORDER — POLYETHYLENE GLYCOL 3350 17 G PO PACK: 17.0000 g | PACK | Freq: Every day | ORAL | Status: DC | PRN

## 2021-06-16 SURGICAL SUPPLY — 70 items
ADH SKN CLS APL DERMABOND .7 (GAUZE/BANDAGES/DRESSINGS) ×1
APL PRP STRL LF DISP 70% ISPRP (MISCELLANEOUS) ×1
BAG COUNTER SPONGE SURGICOUNT (BAG) ×2 IMPLANT
BAG SPNG CNTER NS LX DISP (BAG) ×1
BANDAGE ESMARK 6X9 LF (GAUZE/BANDAGES/DRESSINGS) ×1 IMPLANT
BNDG CMPR 9X6 STRL LF SNTH (GAUZE/BANDAGES/DRESSINGS) ×1
BNDG COHESIVE 6X5 TAN STRL LF (GAUZE/BANDAGES/DRESSINGS) ×2 IMPLANT
BNDG ELASTIC 4X5.8 VLCR STR LF (GAUZE/BANDAGES/DRESSINGS) ×2 IMPLANT
BNDG ELASTIC 6X5.8 VLCR STR LF (GAUZE/BANDAGES/DRESSINGS) ×2 IMPLANT
BNDG ESMARK 6X9 LF (GAUZE/BANDAGES/DRESSINGS) ×2
BNDG GAUZE ELAST 4 BULKY (GAUZE/BANDAGES/DRESSINGS) ×4 IMPLANT
BRUSH SCRUB EZ PLAIN DRY (MISCELLANEOUS) ×4 IMPLANT
CHLORAPREP W/TINT 26 (MISCELLANEOUS) ×2 IMPLANT
COVER SURGICAL LIGHT HANDLE (MISCELLANEOUS) ×4 IMPLANT
CUFF TOURN SGL QUICK 18X4 (TOURNIQUET CUFF) IMPLANT
CUFF TOURN SGL QUICK 24 (TOURNIQUET CUFF)
CUFF TOURN SGL QUICK 34 (TOURNIQUET CUFF)
CUFF TRNQT CYL 24X4X16.5-23 (TOURNIQUET CUFF) IMPLANT
CUFF TRNQT CYL 34X4.125X (TOURNIQUET CUFF) IMPLANT
DERMABOND ADVANCED (GAUZE/BANDAGES/DRESSINGS) ×1
DERMABOND ADVANCED .7 DNX12 (GAUZE/BANDAGES/DRESSINGS) IMPLANT
DRAPE C-ARM 42X72 X-RAY (DRAPES) IMPLANT
DRAPE C-ARMOR (DRAPES) ×2 IMPLANT
DRAPE U-SHAPE 47X51 STRL (DRAPES) ×4 IMPLANT
DRESSING MEPILEX FLEX 4X4 (GAUZE/BANDAGES/DRESSINGS) IMPLANT
DRSG ADAPTIC 3X8 NADH LF (GAUZE/BANDAGES/DRESSINGS) ×2 IMPLANT
DRSG MEPILEX FLEX 4X4 (GAUZE/BANDAGES/DRESSINGS) ×2
ELECT REM PT RETURN 9FT ADLT (ELECTROSURGICAL) ×2
ELECTRODE REM PT RTRN 9FT ADLT (ELECTROSURGICAL) ×1 IMPLANT
GAUZE SPONGE 4X4 12PLY STRL (GAUZE/BANDAGES/DRESSINGS) ×2 IMPLANT
GLOVE SURG ENC MOIS LTX SZ6.5 (GLOVE) ×6 IMPLANT
GLOVE SURG ENC MOIS LTX SZ7.5 (GLOVE) ×8 IMPLANT
GLOVE SURG UNDER POLY LF SZ6.5 (GLOVE) ×2 IMPLANT
GLOVE SURG UNDER POLY LF SZ7.5 (GLOVE) ×2 IMPLANT
GOWN STRL REUS W/ TWL LRG LVL3 (GOWN DISPOSABLE) ×2 IMPLANT
GOWN STRL REUS W/TWL LRG LVL3 (GOWN DISPOSABLE) ×4
GUIDE PIN 3.2X343 (PIN) ×1
GUIDE PIN 3.2X343MM (PIN) ×2
KIT BASIN OR (CUSTOM PROCEDURE TRAY) ×2 IMPLANT
KIT TURNOVER KIT B (KITS) ×2 IMPLANT
MANIFOLD NEPTUNE II (INSTRUMENTS) ×2 IMPLANT
NEEDLE 22X1 1/2 (OR ONLY) (NEEDLE) IMPLANT
NS IRRIG 1000ML POUR BTL (IV SOLUTION) ×2 IMPLANT
PACK ORTHO EXTREMITY (CUSTOM PROCEDURE TRAY) ×2 IMPLANT
PAD ARMBOARD 7.5X6 YLW CONV (MISCELLANEOUS) ×4 IMPLANT
PADDING CAST COTTON 6X4 STRL (CAST SUPPLIES) ×6 IMPLANT
PIN GUIDE 3.2X343MM (PIN) IMPLANT
SPONGE T-LAP 18X18 ~~LOC~~+RFID (SPONGE) ×2 IMPLANT
STAPLER VISISTAT 35W (STAPLE) IMPLANT
STOCKINETTE IMPERVIOUS LG (DRAPES) ×2 IMPLANT
STRIP CLOSURE SKIN 1/2X4 (GAUZE/BANDAGES/DRESSINGS) IMPLANT
SUCTION FRAZIER HANDLE 10FR (MISCELLANEOUS)
SUCTION TUBE FRAZIER 10FR DISP (MISCELLANEOUS) IMPLANT
SUT ETHILON 3 0 PS 1 (SUTURE) IMPLANT
SUT MNCRL AB 3-0 PS2 18 (SUTURE) ×2 IMPLANT
SUT MON AB 2-0 CT1 36 (SUTURE) ×2 IMPLANT
SUT MON AB 3-0 SH 27 (SUTURE) ×2
SUT MON AB 3-0 SH27 (SUTURE) IMPLANT
SUT PDS AB 2-0 CT1 27 (SUTURE) IMPLANT
SUT VIC AB 0 CT1 27 (SUTURE)
SUT VIC AB 0 CT1 27XBRD ANBCTR (SUTURE) IMPLANT
SUT VIC AB 2-0 CT1 27 (SUTURE) ×4
SUT VIC AB 2-0 CT1 TAPERPNT 27 (SUTURE) IMPLANT
SYR CONTROL 10ML LL (SYRINGE) IMPLANT
TOWEL GREEN STERILE (TOWEL DISPOSABLE) ×4 IMPLANT
TOWEL GREEN STERILE FF (TOWEL DISPOSABLE) ×4 IMPLANT
TUBE CONNECTING 12X1/4 (SUCTIONS) ×2 IMPLANT
UNDERPAD 30X36 HEAVY ABSORB (UNDERPADS AND DIAPERS) ×2 IMPLANT
WATER STERILE IRR 1000ML POUR (IV SOLUTION) ×4 IMPLANT
YANKAUER SUCT BULB TIP NO VENT (SUCTIONS) ×2 IMPLANT

## 2021-06-16 NOTE — Transfer of Care (Signed)
Immediate Anesthesia Transfer of Care Note  Patient: [name]  Procedure(s) Performed: HARDWARE REMOVAL RIGHT HIP (Right)  Patient Location: PACU  Anesthesia Type:MAC and Spinal  Level of Consciousness: awake, alert  and oriented  Airway & Oxygen Therapy: Patient Spontanous Breathing  Post-op Assessment: Report given to RN and Post -op Vital signs reviewed and stable  Post vital signs: Reviewed and stable  Last Vitals:  Vitals Value Taken Time  BP 142/64 06/16/21 1057  Temp    Pulse 100 06/16/21 1057  Resp 13 06/16/21 1057  SpO2 96 % 06/16/21 1057  Vitals shown include unvalidated device data.  Last Pain:  Vitals:   06/16/21 0702  TempSrc:   PainSc: 8       Patients Stated Pain Goal: 4 (85/02/77 4128)  Complications: No notable events documented.

## 2021-06-16 NOTE — Anesthesia Procedure Notes (Signed)
Spinal  Patient location during procedure: OR Start time: 06/16/2021 9:37 AM End time: 06/16/2021 9:47 AM Reason for block: surgical anesthesia Staffing Performed: anesthesiologist  Anesthesiologist: Pervis Hocking, DO Preanesthetic Checklist Completed: patient identified, IV checked, risks and benefits discussed, surgical consent, monitors and equipment checked, pre-op evaluation and timeout performed Spinal Block Patient position: sitting Prep: DuraPrep and site prepped and draped Patient monitoring: cardiac monitor, continuous pulse ox and blood pressure Approach: midline Location: L3-4 Injection technique: single-shot Needle Needle type: Pencan  Needle gauge: 24 G Needle length: 9 cm Assessment Sensory level: T6 Events: CSF return Additional Notes Functioning IV was confirmed and monitors were applied. Sterile prep and drape, including hand hygiene and sterile gloves were used. The patient was positioned and the spine was prepped. The skin was anesthetized with lidocaine.  Free flow of clear CSF was obtained prior to injecting local anesthetic into the CSF.  The spinal needle aspirated freely following injection.  The needle was carefully withdrawn.  The patient tolerated the procedure well.

## 2021-06-16 NOTE — Anesthesia Postprocedure Evaluation (Signed)
Anesthesia Post Note  Patient: JONTAE SONIER  Procedure(s) Performed: HARDWARE REMOVAL RIGHT HIP (Right)     Patient location during evaluation: PACU Anesthesia Type: MAC and Spinal Level of consciousness: awake and alert Pain management: pain level controlled Vital Signs Assessment: post-procedure vital signs reviewed and stable Respiratory status: spontaneous breathing, nonlabored ventilation and respiratory function stable Cardiovascular status: blood pressure returned to baseline and stable Postop Assessment: no apparent nausea or vomiting Anesthetic complications: no   No notable events documented.  Last Vitals:  Vitals:   06/16/21 1255 06/16/21 1320  BP: (!) 147/64 (!) 161/57  Pulse: 70 66  Resp: 15 16  Temp: (!) 36.3 C (!) 36.2 C  SpO2: 93% 98%    Last Pain:  Vitals:   06/16/21 1255  TempSrc:   PainSc: 0-No pain                 Pervis Hocking

## 2021-06-16 NOTE — Op Note (Signed)
Orthopaedic Surgery Operative Note (CSN: [id number] ) Date of Surgery: 06/16/2021  Admit Date: 06/16/2021   Diagnoses: Pre-Op Diagnoses: Right intertrochanteric femur fracture Cut-out of right hip orthopaedic hardware  Post-Op Diagnosis: Same  Procedures: CPT 20680-Removal of hardware right hip  Surgeons : Primary: Shona Needles, MD  Assistant: Patrecia Pace, PA-C  Location: OR 3   Anesthesia:General   Antibiotics: Ancef 2g preop   Tourniquet time:None used    Estimated Blood Loss:Minimal  Complications:None  Specimens:None   Implants: Implant Name Type Inv. Item Serial No. Manufacturer Lot No. LRB No. Used Action  SCREW LAG COMBO 95.90 - [id number] Screw SCREW LAG COMBO 95.90  [employer] [id number] Right 1 Explanted     Indications for Surgery: 87 year old female who sustained a right intertrochanteric femur fracture in April of this year.  She subsequently went on to have hardware failure with cut out of her superior femoral neck.  To her proximal screws have been eroding her superior acetabulum.  She was having pain with any attempted motion.  Attempt was made for conservative management..  However she continued to have severe pain.  I discussed with her surgical options including possibility of conversion to a total hip arthroplasty, revision fixation of the proximal femur, and the possibility of limited hardware removal.  After full discussion and risks and benefits of each they felt that they wanted to go with the least invasive option which would be the limited hardware removal.  My objective was to remove the proximal screws to prevent continued pain and erosion of the acetabulum with leg movement of the hip.  I would plan to retain the nail so that there would be some stabilization of the subtrochanteric region of the femur.  Risks and benefits were discussed.  Risks included but not limited to bleeding, infection, persistent pain, need for further surgery  including arthroplasty, DVT, even the possibility anesthetic complications.  They agreed to proceed with surgery and consent was obtained.  Operative Findings: Removal of right hip InterTAN lag screw and compression screw.  Procedure: The patient was identified in the preoperative holding area. Consent was confirmed with the patient and their family and all questions were answered. The operative extremity was marked after confirmation with the patient. she was then brought back to the operating room by our anesthesia colleagues.  She was placed under spinal anesthetic and carefully transferred over to a radiolucent flat top table.  The right lower extremity was then prepped and draped in usual sterile fashion.  A timeout was performed to verify the patient, the procedure, and the extremity.  Preoperative antibiotics were dosed.  Fluoroscopic imaging was obtained to show the hip with the loss of fixation and the erosion of the superior acetabulum.  For started out by making incision over the insertion site.  I used a screwdriver to loosen the setscrew.  I then reopen the lateral incision and then proceeded to remove the compression screw.  I then threaded a guidewire up into the lag screw and then proceeded to used the screwdriver to grasp the lag screw and remove this without difficulty.  Final fluoroscopic imaging was obtained.  The incisions were irrigated.  A layered closure of 2-0 Vicryl and 3-0 Monocryl with Dermabond was used to close the skin.  Sterile dressings were applied.  The patient was woke from anesthesia and taken to the PACU in stable condition.   Post Op Plan/Instructions: Patient will be weightbearing as tolerated to right lower extremity.  She  will be admitted for observation overnight.  She will mobilize with therapy.  We will place her on aspirin for DVT prophylaxis.  I was present and performed the entire surgery.  Patrecia Pace, PA-C did assist me throughout the case. An  assistant was necessary given the difficulty in approach, maintenance of reduction and ability to instrument the fracture.   Katha Hamming, MD [facility]

## 2021-06-16 NOTE — Anesthesia Procedure Notes (Signed)
Procedure Name: MAC Date/Time: 06/16/2021 9:34 AM Performed by: Leonor Liv, CRNA Pre-anesthesia Checklist: Patient identified, Emergency Drugs available, Suction available, Patient being monitored and Timeout performed Patient Re-evaluated:Patient Re-evaluated prior to induction Oxygen Delivery Method: Simple face mask Placement Confirmation: positive ETCO2 Dental Injury: Teeth and Oropharynx as per pre-operative assessment

## 2021-06-16 NOTE — Interval H&P Note (Signed)
History and Physical Interval Note:  06/16/2021 8:58 AM  [name]  has presented today for surgery, with the diagnosis of Right hip hardware failure.  The various methods of treatment have been discussed with the patient and family. After consideration of risks, benefits and other options for treatment, the patient has consented to  Procedure(s): HARDWARE REMOVAL RIGHT HIP (Right) as a surgical intervention.  The patient's history has been reviewed, patient examined, no change in status, stable for surgery.  I have reviewed the patient's chart and labs.  Questions were answered to the patient's satisfaction.     Lennette Bihari P Gloriana Piltz

## 2021-06-16 NOTE — Anesthesia Preprocedure Evaluation (Addendum)
Anesthesia Evaluation  Patient identified by MRN, date of birth, ID band Patient awake    Reviewed: Allergy & Precautions, NPO status , Patient's Chart, lab work & pertinent test results  Airway Mallampati: II  TM Distance: >3 FB Neck ROM: Full    Dental no notable dental hx. (+) Teeth Intact, Dental Advisory Given   Pulmonary neg pulmonary ROS,    Pulmonary exam normal breath sounds clear to auscultation       Cardiovascular hypertension (170/60), Pt. on medications Normal cardiovascular exam Rhythm:Regular Rate:Normal     Neuro/Psych  Headaches, PSYCHIATRIC DISORDERS Anxiety Depression Dementia    GI/Hepatic Neg liver ROS, GERD  Medicated and Controlled,  Endo/Other  neg diabetesHypothyroidism   Renal/GU negative Renal ROS  negative genitourinary   Musculoskeletal  (+) Arthritis , Osteoarthritis,  Right hip painful hardware   Abdominal   Peds negative pediatric ROS (+)  Hematology negative hematology ROS (+)   Anesthesia Other Findings   Reproductive/Obstetrics negative OB ROS                            Anesthesia Physical Anesthesia Plan  ASA: 3  Anesthesia Plan: MAC and Spinal   Post-op Pain Management:    Induction:   PONV Risk Score and Plan: 2 and Propofol infusion, TIVA and Treatment may vary due to age or medical condition  Airway Management Planned: Natural Airway and Simple Face Mask  Additional Equipment: None  Intra-op Plan:   Post-operative Plan:   Informed Consent: I have reviewed the patients History and Physical, chart, labs and discussed the procedure including the risks, benefits and alternatives for the proposed anesthesia with the patient or authorized representative who has indicated his/her understanding and acceptance.     Dental advisory given and Consent reviewed with POA  Plan Discussed with: CRNA  Anesthesia Plan Comments:         Anesthesia Quick Evaluation

## 2021-06-17 DIAGNOSIS — T8484XA Pain due to internal orthopedic prosthetic devices, implants and grafts, initial encounter: Secondary | ICD-10-CM | POA: Diagnosis not present

## 2021-06-17 LAB — CBC
HCT: 30.6 % — ABNORMAL LOW (ref 36.0–46.0)
Hemoglobin: 9.6 g/dL — ABNORMAL LOW (ref 12.0–15.0)
MCH: 29.3 pg (ref 26.0–34.0)
MCHC: 31.4 g/dL (ref 30.0–36.0)
MCV: 93.3 fL (ref 80.0–100.0)
Platelets: 344 10*3/uL (ref 150–400)
RBC: 3.28 MIL/uL — ABNORMAL LOW (ref 3.87–5.11)
RDW: 15.6 % — ABNORMAL HIGH (ref 11.5–15.5)
WBC: 6.2 10*3/uL (ref 4.0–10.5)
nRBC: 0 % (ref 0.0–0.2)

## 2021-06-17 MED ORDER — CHLORHEXIDINE GLUCONATE CLOTH 2 % EX PADS
6.0000 | MEDICATED_PAD | Freq: Every day | CUTANEOUS | Status: DC
  Administered 2021-06-17: 6 via TOPICAL

## 2021-06-17 NOTE — Discharge Summary (Signed)
Orthopaedic Trauma Service (OTS) Discharge Summary   Patient ID: [name] MRN: [id number] DOB/AGE: [birth year] 87 y.o.  Admit date: 06/16/2021 Discharge date: 06/17/2021  Admission Diagnoses:1. Right intertrochanteric femur fracture 2. Cut-out of right hip orthopaedic hardware  Discharge Diagnoses:  Active Problems:   Hip fracture ([place])   Painful orthopaedic hardware [facility])   Past Medical History:  Diagnosis Date   Anemia    Anxiety    Arthritis    "all over" (04/23/2017)   Cataract 2015   bilateral cat. extr.   Dementia ([place])    Depression    GERD (gastroesophageal reflux disease)    Heart murmur    High cholesterol    History of pelvic fracture 03/2016   Hypertension    Macular degeneration of both eyes    Migraine    "started when I was in the 9th grade; lasted a couple years; started back in my 80's; had one daily right before OR" (04/23/2017)   Osteoporosis    Type II diabetes mellitus ([place])    "took Metformin for awhile; made me sick; diabetes is diet controlled" (04/23/2017)     Procedures Performed: CPT 20680-Removal of hardware right hip    Discharged Condition: good  Hospital Course: Patient presented to [facility] on 06/16/2021 for scheduled procedure on right hip.  Stated to the operating room by Dr. Doreatha Martin and underwent the above procedure..  She tolerated without complications.  Was noted overnight for pain control and therapies. On 06/17/2021, the patient was tolerating diet, working well with therapies, pain well controlled, vital signs stable, dressings clean, dry, intact and felt stable for discharge to home. Patient will follow up as below and knows to call with questions or concerns.     Consults: None  Significant Diagnostic Studies:   Results for orders placed or performed during the hospital encounter of 06/16/21 (from the past 168 hour(s))  SARS Coronavirus 2 by RT PCR (hospital order, performed in [place] hospital lab)  Nasopharyngeal Nasopharyngeal Swab   Collection Time: 06/16/21  6:28 AM   Specimen: Nasopharyngeal Swab  Result Value Ref Range   SARS Coronavirus 2 NEGATIVE NEGATIVE  Glucose, capillary   Collection Time: 06/16/21  6:35 AM  Result Value Ref Range   Glucose-Capillary 89 70 - 99 mg/dL  Basic metabolic panel per protocol   Collection Time: 06/16/21  6:46 AM  Result Value Ref Range   Sodium 137 135 - 145 mmol/L   Potassium 4.3 3.5 - 5.1 mmol/L   Chloride 103 98 - 111 mmol/L   CO2 26 22 - 32 mmol/L   Glucose, Bld 88 70 - 99 mg/dL   BUN 16 8 - 23 mg/dL   Creatinine, Ser 0.72 0.44 - 1.00 mg/dL   Calcium 9.1 8.9 - 10.3 mg/dL   GFR, Estimated >60 >60 mL/min   Anion gap 8 5 - 15  CBC per protocol   Collection Time: 06/16/21  6:46 AM  Result Value Ref Range   WBC 6.2 4.0 - 10.5 K/uL   RBC 3.69 (L) 3.87 - 5.11 MIL/uL   Hemoglobin 10.6 (L) 12.0 - 15.0 g/dL   HCT 35.1 (L) 36.0 - 46.0 %   MCV 95.1 80.0 - 100.0 fL   MCH 28.7 26.0 - 34.0 pg   MCHC 30.2 30.0 - 36.0 g/dL   RDW 15.7 (H) 11.5 - 15.5 %   Platelets 402 (H) 150 - 400 K/uL   nRBC 0.0 0.0 - 0.2 %  Glucose, capillary  Collection Time: 06/16/21  8:46 AM  Result Value Ref Range   Glucose-Capillary 87 70 - 99 mg/dL  Glucose, capillary   Collection Time: 06/16/21 10:57 AM  Result Value Ref Range   Glucose-Capillary 84 70 - 99 mg/dL   Comment 1 Notify RN   CBC   Collection Time: 06/17/21 12:45 AM  Result Value Ref Range   WBC 6.2 4.0 - 10.5 K/uL   RBC 3.28 (L) 3.87 - 5.11 MIL/uL   Hemoglobin 9.6 (L) 12.0 - 15.0 g/dL   HCT 30.6 (L) 36.0 - 46.0 %   MCV 93.3 80.0 - 100.0 fL   MCH 29.3 26.0 - 34.0 pg   MCHC 31.4 30.0 - 36.0 g/dL   RDW 15.6 (H) 11.5 - 15.5 %   Platelets 344 150 - 400 K/uL   nRBC 0.0 0.0 - 0.2 %     Treatments: IV hydration, antibiotics: Ancef, analgesia: acetaminophen, Vicodin, and tramadol, cardiac meds: metoprolol, anticoagulation: ASA, therapies: PT and OT, and surgery: as above  Discharge  Exam: General: Resting in bed comfortably, no acute distress Respiratory: No increased work of breathing at rest Right lower extremity: Dressings over lateral hip and thigh clean, dry, intact.  No significant swelling noted about this area.  Extremity warm and well-perfused.  Neurovascularly intact.  Disposition: Discharge disposition: 01-Home or Self Care        Allergies as of 06/17/2021       Reactions   Sulfamethoxazole Diarrhea, Other (See Comments)   GI UPSET        Medication List     TAKE these medications    aspirin 325 MG EC tablet Take 325 mg by mouth at bedtime.   atorvastatin 20 MG tablet Commonly known as: LIPITOR Take 20 mg by mouth at bedtime.   CALCIUM CITRATE-VITAMIN D PO Take 1 tablet by mouth in the morning and at bedtime.   COD LIVER OIL PO Take 1 capsule by mouth daily.   diclofenac Sodium 1 % Gel Commonly known as: VOLTAREN Apply 2-4 g topically See admin instructions. Apply 2-4 grams to affected areas of the spine, knees, and bilateral shoulders nightly as needed   docusate sodium 100 MG capsule Commonly known as: COLACE Take 1 capsule (100 mg total) by mouth 2 (two) times daily. What changed:  when to take this reasons to take this   Glucosamine HCl 1000 MG Tabs Take 1,000 mg by mouth in the morning and at bedtime.   HYDROcodone-acetaminophen 5-325 MG tablet Commonly known as: NORCO/VICODIN Take 1 tablet by mouth every 6 (six) hours as needed for severe pain.   ICAPS PO Take 1 capsule by mouth 2 (two) times daily.   IRON 27 PO Take 27 mg by mouth at bedtime.   lisinopril 5 MG tablet Commonly known as: ZESTRIL Take 1 tablet (5 mg total) by mouth daily.   Magnesium 250 MG Tabs Take 250 mg by mouth at bedtime.   meloxicam 7.5 MG tablet Commonly known as: MOBIC Take 1 tablet (7.5 mg total) by mouth daily.   mirtazapine 15 MG disintegrating tablet Commonly known as: REMERON SOL-TAB Take 1 tablet (15 mg total) by mouth at  bedtime.   omeprazole 40 MG capsule Commonly known as: PRILOSEC Take 40 mg by mouth daily before breakfast.   PARoxetine 10 MG tablet Commonly known as: PAXIL Take 10 mg by mouth at bedtime.   Polyethyl Glycol-Propyl Glycol 0.4-0.3 % Soln Place 1 drop into both eyes at bedtime.   polyethylene glycol  17 g packet Commonly known as: MIRALAX / GLYCOLAX Take 17 g by mouth daily as needed for moderate constipation.   psyllium 58.6 % packet Commonly known as: METAMUCIL Take 1 packet by mouth daily at 12 noon.   QUEtiapine 50 MG tablet Commonly known as: SEROQUEL Take 1 tablet (50 mg total) by mouth at bedtime.   senna 8.6 MG Tabs tablet Commonly known as: SENOKOT Take 1 tablet (8.6 mg total) by mouth 2 (two) times daily. What changed:  when to take this reasons to take this   traMADol 50 MG tablet Commonly known as: ULTRAM Take 50 mg by mouth at bedtime.   Vitamin D3 25 MCG tablet Commonly known as: Vitamin D Take 1 tablet (1,000 Units total) by mouth daily.        Follow-up Information     Haddix, Thomasene Lot, MD. Schedule an appointment as soon as possible for a visit in 2 week(s).   Specialty: Orthopedic Surgery Why: for repeat x-rays and wound check Contact information: [place] [place] [address] [phone]                 Discharge Instructions and Plan: Patient will be discharged to home.  We will continue with home health physical and Occupational Therapy.  Will be discharged on Aspirin for DVT prophylaxis. Patient has all the necessary DME for discharge. Patient will follow up with Dr. Doreatha Martin in 2 weeks for repeat x-rays and wound check   Signed:  Judson Roch A. Carmie Kanner ?[phone] (phone) 06/17/2021, 7:16 AM  [facility] [place] [id number] [phone] (F)

## 2021-06-17 NOTE — Care Management CC44 (Signed)
Condition Code 44 Documentation Completed  Patient Details  Name: [name] MRN: [id number] Date of Birth: April 07, 1923   Condition Code 44 given:  Yes Patient signature on Condition Code 44 notice:  Yes Documentation of 2 MD's agreement:  Yes Code 44 added to claim:  Yes    Bartholomew Crews, RN 06/17/2021, 8:49 AM

## 2021-06-17 NOTE — Progress Notes (Signed)
Pt discharged home with daughter in stable condition

## 2021-06-17 NOTE — Discharge Instructions (Signed)
Orthopaedic Trauma Service Discharge Instructions   General Discharge Instructions  WEIGHT BEARING STATUS:weightbearing as tolerated  RANGE OF MOTION/ACTIVITY:Ok for hip motion as tolerated  Wound Care:You may remove your surgical dressings on post-op day #3 (Monday 06/19/21). Incisions can be left open to air if there is no drainage. If incision continues to have drainage, follow wound care instructions below. Okay to shower if no drainage from incisions.  DVT/PE prophylaxis: Aspirin  Diet: as you were eating previously.  Can use over the counter stool softeners and bowel preparations, such as Miralax, to help with bowel movements.  Narcotics can be constipating.  Be sure to drink plenty of fluids  PAIN MEDICATION USE AND EXPECTATIONS  You have likely been given narcotic medications to help control your pain.  After a traumatic event that results in an fracture (broken bone) with or without surgery, it is ok to use narcotic pain medications to help control one's pain.  We understand that everyone responds to pain differently and each individual patient will be evaluated on a regular basis for the continued need for narcotic medications. Ideally, narcotic medication use should last no more than 6-8 weeks (coinciding with fracture healing).   As a patient it is your responsibility as well to monitor narcotic medication use and report the amount and frequency you use these medications when you come to your office visit.   We would also advise that if you are using narcotic medications, you should take a dose prior to therapy to maximize you participation.  IF YOU ARE ON NARCOTIC MEDICATIONS IT IS NOT PERMISSIBLE TO OPERATE A MOTOR VEHICLE (MOTORCYCLE/CAR/TRUCK/MOPED) OR HEAVY MACHINERY DO NOT MIX NARCOTICS WITH OTHER CNS (CENTRAL NERVOUS SYSTEM) DEPRESSANTS SUCH AS ALCOHOL   STOP SMOKING OR USING NICOTINE PRODUCTS!!!!  As discussed nicotine severely impairs your body's ability to heal  surgical and traumatic wounds but also impairs bone healing.  Wounds and bone heal by forming microscopic blood vessels (angiogenesis) and nicotine is a vasoconstrictor (essentially, shrinks blood vessels).  Therefore, if vasoconstriction occurs to these microscopic blood vessels they essentially disappear and are unable to deliver necessary nutrients to the healing tissue.  This is one modifiable factor that you can do to dramatically increase your chances of healing your injury.    (This means no smoking, no nicotine gum, patches, etc)     ICE AND ELEVATE INJURED/OPERATIVE EXTREMITY  Using ice and elevating the injured extremity above your heart can help with swelling and pain control.  Icing in a pulsatile fashion, such as 20 minutes on and 20 minutes off, can be followed.    Do not place ice directly on skin. Make sure there is a barrier between to skin and the ice pack.    Using frozen items such as frozen peas works well as the conform nicely to the are that needs to be iced.  USE AN ACE WRAP OR TED HOSE FOR SWELLING CONTROL  In addition to icing and elevation, Ace wraps or TED hose are used to help limit and resolve swelling.  It is recommended to use Ace wraps or TED hose until you are informed to stop.    When using Ace Wraps start the wrapping distally (farthest away from the body) and wrap proximally (closer to the body)   Example: If you had surgery on your leg or thing and you do not have a splint on, start the ace wrap at the toes and work your way up to the thigh  If you had surgery on your upper extremity and do not have a splint on, start the ace wrap at your fingers and work your way up to the upper arm    [place]: [phone]   VISIT OUR WEBSITE FOR ADDITIONAL INFORMATION: [email or web address]     Discharge Wound Care Instructions  Do NOT apply any ointments, solutions or lotions to pin sites or surgical wounds.  These prevent  needed drainage and even though solutions like hydrogen peroxide kill bacteria, they also damage cells lining the pin sites that help fight infection.  Applying lotions or ointments can keep the wounds moist and can cause them to breakdown and open up as well. This can increase the risk for infection. When in doubt call the office.  If any drainage is noted, use foam dressing  Once the incision is completely dry and without drainage, it may be left open to air out.  Showering may begin 36-48 hours later.  Cleaning gently with soap and water.

## 2021-06-17 NOTE — TOC Progression Note (Signed)
Transition of Care [facility]) - Progression Note    Patient Details  Name: [name] MRN: [id number] Date of Birth: Aug 04, 1923  Transition of Care [facility]) CM/SW Contact  Bartholomew Crews, RN Phone Number: [phone] 06/17/2021, 9:33 AM  Clinical Narrative:     Referral accepted by [facility]mings for [facility] PT/OT. Pending HH orders prior to discharge.   Expected Discharge Plan: [place] Services Barriers to Discharge: Other (must enter comment) (pending PT/OT evaluations)  Expected Discharge Plan and Services Expected Discharge Plan: [place] In-house Referral: NA Discharge Planning Services: CM Consult Post Acute Care Choice: [place] arrangements for the past 2 months: Single Family Home Expected Discharge Date: 06/17/21               DME Arranged: N/A DME Agency: NA       HH Arranged: PT, OT HH Agency: [place] Date HH Agency Contacted: 06/17/21 Time HH Agency Contacted: 0900 Representative spoke with at [place]: [place] ([place]) Interventions    Readmission Risk Interventions Readmission Risk Prevention Plan 03/15/2020  Post Dischage Appt Complete  Medication Screening Complete  Transportation Screening Complete  Some recent data might be hidden

## 2021-06-17 NOTE — Evaluation (Signed)
Physical Therapy Evaluation Patient Details Name: [name] MRN: [id number] DOB: 1923-05-19 Today's Date: 06/17/2021   History of Present Illness  Pattient is a 21 yeatr old female S/P IM nailing in 4/27. The screws migrated and she came into the hospital for hardware removal on 06/16/2021. PMH: anxiety, depression, T12 cx fx DMII, osteoperosis,  Clinical Impression  Patient required mod a for bed mobility, transfer to the commode, and transfer to a chair. The patients daughter reports this is baseline. She has aides and a very supportive family at home. The patient reports, her leg feels weak, but there is less pain then prior to the surgery. She would benefit from home health to improve strength and ability to transfer at home. Acute therapy will continue to follow.     Follow Up Recommendations Home health PT    Equipment Recommendations  None recommended by PT    Recommendations for Other Services       Precautions / Restrictions Precautions Precautions: Fall Restrictions Weight Bearing Restrictions: Yes RLE Weight Bearing: Weight bearing as tolerated      Mobility  Bed Mobility Overal bed mobility: Needs Assistance             General bed mobility comments: Per daughter patient required +2 mod a to get get out of bed. She reports this is baseline    Transfers Overall transfer level: Needs assistance Equipment used: 2 person hand held assist Transfers: Stand Pivot Transfers   Stand pivot transfers: Mod assist;+2 physical assistance;+2 safety/equipment       General transfer comment: Patient required mod a to stand and transfer from the chair. The patient reports less pain at this time with transfering trhen before the procedure  Ambulation/Gait                Stairs            Wheelchair Mobility    Modified Rankin (Stroke Patients Only)       Balance Overall balance assessment: Needs assistance Sitting-balance support: No upper  extremity supported;Feet supported Sitting balance-Leahy Scale: Good     Standing balance support: Bilateral upper extremity supported Standing balance-Leahy Scale: Poor Standing balance comment: needed mod a to transfer                             Pertinent Vitals/Pain Pain Assessment: Faces Faces Pain Scale: Hurts little more Pain Location: right hip Pain Descriptors / Indicators: Aching Pain Intervention(s): Limited activity within patient's tolerance    Home Living Family/patient expects to be discharged to:: Private residence Living Arrangements: Spouse/significant other;Children Available Help at Discharge: Family Type of Home: House Home Access: Ramped entrance     Home Layout: One level Home Equipment: Clinical cytogeneticist - 2 wheels;Kasandra Knudsen - single point Additional Comments: lives with husband. Daughters also take care of her. Has 2 aides that come in daily    Prior Function Level of Independence: Needs assistance   Gait / Transfers Assistance Needed: was only transfering from bed to chair 2nd to hip pain  ADL's / Homemaking Assistance Needed: has aides and daughters for ADL's        Hand Dominance   Dominant Hand: Right    Extremity/Trunk Assessment   Upper Extremity Assessment Upper Extremity Assessment: Defer to OT evaluation    Lower Extremity Assessment Lower Extremity Assessment: RLE deficits/detail RLE Deficits / Details: Patient can moveright hip against gravity with flexion; knee extension 4+/5 on  right.    Cervical / Trunk Assessment Cervical / Trunk Assessment: Kyphotic  Communication   Communication: HOH  Cognition Arousal/Alertness: Awake/alert Behavior During Therapy: WFL for tasks assessed/performed Overall Cognitive Status: Within Functional Limits for tasks assessed                                        General Comments      Exercises     Assessment/Plan    PT Assessment Patient needs continued PT  services  PT Problem List Decreased strength;Decreased range of motion;Decreased activity tolerance;Decreased mobility;Decreased balance;Pain       PT Treatment Interventions DME instruction;Gait training;Functional mobility training;Therapeutic activities;Therapeutic exercise;Neuromuscular re-education;Patient/family education    PT Goals (Current goals can be found in the Care Plan section)  Acute Rehab PT Goals Patient Stated Goal: to get stronger PT Goal Formulation: With patient/family Time For Goal Achievement: 06/24/21 Potential to Achieve Goals: Good    Frequency Min 2X/week   Barriers to discharge        Co-evaluation               AM-PAC PT "6 Clicks" Mobility  Outcome Measure Help needed turning from your back to your side while in a flat bed without using bedrails?: A Lot Help needed moving from lying on your back to sitting on the side of a flat bed without using bedrails?: A Lot Help needed moving to and from a bed to a chair (including a wheelchair)?: A Lot Help needed standing up from a chair using your arms (e.g., wheelchair or bedside chair)?: A Lot Help needed to walk in hospital room?: Total Help needed climbing 3-5 steps with a railing? : Total 6 Click Score: 10    End of Session Equipment Utilized During Treatment: Gait belt Activity Tolerance: Patient limited by fatigue;No increased pain Patient left: in chair;with call bell/phone within reach;with family/visitor present Nurse Communication: Mobility status PT Visit Diagnosis: Unsteadiness on feet (R26.81);Other abnormalities of gait and mobility (R26.89);Muscle weakness (generalized) (M62.81);Difficulty in walking, not elsewhere classified (R26.2)    Time: [id number] PT Time Calculation (min) (ACUTE ONLY): 20 min   Charges:   PT Evaluation $PT Eval Moderate Complexity: 1 Mod            Carney Living PT DPT  06/17/2021, 10:16 AM

## 2021-06-17 NOTE — Progress Notes (Addendum)
Pt daughter requests to wait on d/c foley until PT eval. Will notify oncoming shift RN.

## 2021-06-17 NOTE — Care Management Obs Status (Signed)
[place] NOTIFICATION   Patient Details  Name: NYDA CHEESMAN MRN: [id number] Date of Birth: 22-Mar-1923   Medicare Observation Status Notification Given:  Yes    Bartholomew Crews, RN 06/17/2021, 8:49 AM

## 2021-06-17 NOTE — TOC Initial Note (Signed)
Transition of Care [facility]) - Initial/Assessment Note    Patient Details  Name: [name] MRN: [id number] Date of Birth: 1923/06/22  Transition [facility]) CM/SW Contact:    Bartholomew Crews, RN Phone Number: [phone] 06/17/2021, 8:54 AM  Clinical Narrative:                  Spoke with patient and daughter, Izora Gala, at bedside. PTA patient living at home with spouse and 2 private caregivers. Family supplements care on the weekend to allow caregivers time off. All needed DME in the home. Family wants [facility] for skilled home health needs - referral pending. Requested [facility] PT/OT orders from provider. Family to provide transportation home in private vehicle. Demographics verified in [place]. PCP and preferred pharmacy verified in [place]. Patient also uses mail order for some medications. TOC following for transition needs.   Expected Discharge Plan: [place] Services Barriers to Discharge: Other (must enter comment) (pending PT/OT evaluations)   Patient Goals and CMS Choice Patient states their goals for this hospitalization and ongoing recovery are:: home with family support   Choice offered to / list presented to : Patient, Adult Children Gilmore Laroche)  Expected Discharge Plan and Services Expected Discharge Plan: [place] In-house Referral: NA Discharge Planning Services: CM Consult Post Acute Care Choice: [place] arrangements for the past 2 months: Single Family Home Expected Discharge Date: 06/17/21               DME Arranged: N/A DME Agency: NA       HH Arranged: PT, OT HH Agency: [place] Date [facility] Agency Contacted: 06/17/21 Time [place]: [phone] Representative spoke with at [place]: Tommi Rumps - pending  Prior Living Arrangements/Services Living arrangements for the past 2 months: [place] Lives with:: Self, Spouse Patient language and need for interpreter reviewed:: Yes Do you feel safe going back to the  place where you live?: Yes      Need for Family Participation in Patient Care: Yes (Comment) Care giver support system in place?: Yes (comment) Current home services: DME, Other (comment) (2 full time caregivers during the week and family supplements on weekends to give caregivers time off) Criminal Activity/Legal Involvement Pertinent to Current Situation/Hospitalization: No - Comment as needed  Activities of Daily Living Home Assistive Devices/Equipment: Eyeglasses, Blood pressure cuff, Scales, Walker (specify type), Wheelchair, Bedside commode/3-in-1 ADL Screening (condition at time of admission) Patient's cognitive ability adequate to safely complete daily activities?: Yes Is the patient deaf or have difficulty hearing?: Yes Does the patient have difficulty seeing, even when wearing glasses/contacts?: Yes Does the patient have difficulty concentrating, remembering, or making decisions?: Yes Patient able to express need for assistance with ADLs?: Yes Does the patient have difficulty dressing or bathing?: Yes Independently performs ADLs?: No Communication: Independent, Appropriate for developmental age Dressing (OT): Needs assistance Is this a change from baseline?: Pre-admission baseline Grooming: Needs assistance Is this a change from baseline?: Pre-admission baseline Feeding: Independent Bathing: Needs assistance Is this a change from baseline?: Pre-admission baseline Toileting: Needs assistance Is this a change from baseline?: Pre-admission baseline In/Out Bed: Needs assistance Is this a change from baseline?: Pre-admission baseline Walks in Home: Needs assistance Is this a change from baseline?: Pre-admission baseline Does the patient have difficulty walking or climbing stairs?: Yes Weakness of Legs: Both Weakness of Arms/Hands: Both  Permission Sought/Granted Permission sought to share information with : Other (comment) Permission granted to  share information with : Yes,  Verbal Permission Granted  Share Information with NAME: Gilmore Laroche     Permission granted to share info w Relationship: daughter  Permission granted to share info w Contact Information: [phone]  Emotional Assessment Appearance:: Appears stated age Attitude/Demeanor/Rapport: Engaged Affect (typically observed): Accepting Orientation: : Oriented to Self, Oriented to Place, Oriented to  Time, Oriented to Situation Alcohol / Substance Use: Not Applicable Psych Involvement: No (comment)  Admission diagnosis:  Painful orthopaedic hardware [facility]) [T84.84XA] Patient Active Problem List   Diagnosis Date Noted   Painful orthopaedic hardware ([place]) 05/24/2021   Hip fracture ([place]) 01/31/2021   Fall at home, initial encounter 01/31/2021   Closed comminuted intertrochanteric fracture of proximal end of right femur ([place]) 01/31/2021   DNR (do not resuscitate) 01/31/2021   Back pain 03/09/2020   Closed T12 fracture ([place]) 03/08/2020   Type II diabetes mellitus ([place])    Depression    Hyperlipidemia associated with type 2 diabetes mellitus ([place])    Hypothyroidism    Hypertension associated with diabetes ([place])    Screening for viral disease 08/13/2019   Heme positive stool 08/13/2019   Anemia 08/13/2019   Melena 08/13/2019   S/P total knee replacement 04/22/2017   PCP:  Myrlene Broker, MD Pharmacy:   [facility] [address] [place] [address] Phone: [phone] Fax: [phone]  [facility]  ([place], [place] [facility] [address] [place] [address] Phone: [phone] Fax: [phone]     Social Determinants of Health (SDOH) Interventions    Readmission Risk Interventions Readmission Risk Prevention Plan 03/15/2020  Post Dischage Appt Complete  Medication Screening Complete  Transportation Screening Complete  Some recent data might be hidden

## 2021-06-17 NOTE — Progress Notes (Signed)
Occupational Therapy Evaluation Patient Details Name: [name] MRN: [id number] DOB: Nov 09, 1922 Today's Date: 06/17/2021    History of Present Illness Pattient is a 20 yeatr old female S/P IM nailing in 4/27. The screws migrated and she came into the hospital for hardware removal on 06/16/2021. PMH: anxiety, depression, T12 cx fx DMII, osteoperosis,   Clinical Impression   [name] was evaluated s/p the above hardware removal the patient. PTA she required mod-max A for all ADLs and functional transfers due to pain, general weakness and cognition. Upon evaluation pt was mod-max A for all ADLs and transfers and reports 1/10 pain. Per her daughters report, she preformed better than her baseline. Pt demonstrated poor ability to utilize utensils for self feeding, built up handle given to increase indep. Pt has great home support with 24/7 assist. Recommend d/c home with current level of care and [facility] OT.    Follow Up Recommendations  Home health OT;Supervision/Assistance - 24 hour    Equipment Recommendations  None recommended by OT       Precautions / Restrictions Precautions Precautions: Fall Restrictions Weight Bearing Restrictions: Yes RLE Weight Bearing: Weight bearing as tolerated      Mobility Bed Mobility Overal bed mobility: Needs Assistance             General bed mobility comments: pt in chair  upon arrival - sleeps in recliner at home    Transfers Overall transfer level: Needs assistance Equipment used: 2 person hand held assist Transfers: Sit to/from Stand Sit to Stand: Mod assist;+2 physical assistance;+2 safety/equipment Stand pivot transfers: Mod assist;+2 physical assistance;+2 safety/equipment       General transfer comment: mod A +2 for sit<>stand, less pain with standing and less assist than baseline    Balance Overall balance assessment: Needs assistance Sitting-balance support: No upper extremity supported;Feet supported Sitting balance-Leahy Scale:  Fair Sitting balance - Comments: leans to the L Postural control: Left lateral lean Standing balance support: Bilateral upper extremity supported Standing balance-Leahy Scale: Poor Standing balance comment: needed mod a to transfer                           ADL either performed or assessed with clinical judgement   ADL Overall ADL's : Needs assistance/impaired;At baseline Eating/Feeding: Minimal assistance;Sitting Eating/Feeding Details (indicate cue type and reason): Provided pt with built up handles for utencils to increase her indep with self feeding Grooming: Minimal assistance;Sitting Grooming Details (indicate cue type and reason): provided pt with built up handle for tooth brush to increase indep with oral hygiene task Upper Body Bathing: Sitting;Moderate assistance   Lower Body Bathing: Maximal assistance;+2 for physical assistance;+2 for safety/equipment;Sit to/from stand   Upper Body Dressing : Minimal assistance;Sitting   Lower Body Dressing: Maximal assistance;+2 for physical assistance;+2 for safety/equipment;Sit to/from stand   Toilet Transfer: Maximal assistance;+2 for physical assistance;BSC;Stand-pivot;+2 for safety/equipment   Toileting- Clothing Manipulation and Hygiene: Moderate assistance;Sitting/lateral lean       Functional mobility during ADLs: Maximal assistance;+2 for physical assistance;+2 for safety/equipment General ADL Comments: pt currently performing ADLs at/around her baseline level; pt reports less pain with standign and transferring ADL tasks     Vision Ability to See in Adequate Light: 0 Adequate Vision Assessment?: No apparent visual deficits     Perception     Praxis      Pertinent Vitals/Pain Pain Assessment: 0-10 Pain Score: 1  Faces Pain Scale: Hurts little more Pain Location: right hip Pain  Descriptors / Indicators: Aching Pain Intervention(s): Limited activity within patient's tolerance;Monitored during session      Hand Dominance Right   Extremity/Trunk Assessment Upper Extremity Assessment Upper Extremity Assessment: Generalized weakness (poor grip strength, coordination and digit ROM)   Lower Extremity Assessment Lower Extremity Assessment: Defer to PT evaluation RLE Deficits / Details: Patient can moveright hip against gravity with flexion; knee extension 4+/5 on right.   Cervical / Trunk Assessment Cervical / Trunk Assessment: Kyphotic   Communication Communication Communication: HOH   Cognition Arousal/Alertness: Awake/alert Behavior During Therapy: WFL for tasks assessed/performed Overall Cognitive Status: Within Functional Limits for tasks assessed                                     General Comments  VSS on RA, pt's daughter states that she is at, or better than her baseline    Exercises     Shoulder Instructions      Home Living Family/patient expects to be discharged to:: Private residence Living Arrangements: Spouse/significant other;Children Available Help at Discharge: Family;Personal care attendant;Available 24 hours/day Type of Home: House Home Access: Ramped entrance     Home Layout: One level     Bathroom Shower/Tub: Occupational psychologist: Standard     Home Equipment: Shower seat;Bedside commode;Walker - 2 wheels;Wheelchair - manual;Hospital bed   Additional Comments: lives with husband. Daughters also take care of her. Has 2 aides that come in daily; overall pt has 24/7 care.      Prior Functioning/Environment Level of Independence: Needs assistance  Gait / Transfers Assistance Needed: was only transfering from bed to chair 2nd to hip pain ADL's / Homemaking Assistance Needed: has aides and daughters for ADL's; sponge bathes Communication / Swallowing Assistance Needed: history of dementia Comments: Pt has been very dependent on others fro her care due to hip pain        OT Problem List: Decreased strength;Decreased  range of motion;Decreased activity tolerance;Impaired balance (sitting and/or standing);Decreased safety awareness;Decreased knowledge of use of DME or AE;Decreased knowledge of precautions;Pain      OT Treatment/Interventions:      OT Goals(Current goals can be found in the care plan section) Acute Rehab OT Goals Patient Stated Goal: to get stronger OT Goal Formulation: All assessment and education complete, DC therapy  OT Frequency:     Barriers to D/C:            Co-evaluation              AM-PAC OT "6 Clicks" Daily Activity     Outcome Measure Help from another person eating meals?: A Little Help from another person taking care of personal grooming?: A Little Help from another person toileting, which includes using toliet, bedpan, or urinal?: A Lot Help from another person bathing (including washing, rinsing, drying)?: A Lot Help from another person to put on and taking off regular upper body clothing?: A Little Help from another person to put on and taking off regular lower body clothing?: A Lot 6 Click Score: 15   End of Session Nurse Communication: Mobility status;Weight bearing status;Precautions  Activity Tolerance: Patient tolerated treatment well Patient left: in chair;with call bell/phone within reach;with family/visitor present  OT Visit Diagnosis: Other abnormalities of gait and mobility (R26.89);Unsteadiness on feet (R26.81);Pain                Time: [id number] OT Time Calculation (min):  20 min Charges:  OT General Charges $OT Visit: 1 Visit OT Evaluation $OT Eval Moderate Complexity: 1 Mod   Syna Gad A Teneshia Hedeen 06/17/2021, 10:24 AM

## 2021-06-19 ENCOUNTER — Encounter (HOSPITAL_COMMUNITY): Payer: Self-pay | Admitting: Student

## 2021-12-18 NOTE — Progress Notes (Signed)
[name], [name] ([id number]) Visit Report for 12/19/2021 Allergy List Details Patient Name: Date of Service: [name], [name] 12/19/2021 2:30 PM Medical Record Number: [id number] Patient Account Number: [id number] Date of Birth/Sex: Treating RN: 11/29/1922 (88 y.o. F) Primary Care Laquanna Veazey: Other Clinician: Referring Braysen Cloward: Treating Sarea Fyfe/Extender: Jadene Pierini in Treatment: 0 Allergies Active Allergies sulfamethoxazole Reaction: diarrhea, GI upset Severity: Mild Active: 08/22/2011 Allergy Notes Electronic Signature(s) Signed: 12/18/2021 7:06:08 PM By: Donavan Burnet CHT EMT BS , , Entered By: Donavan Burnet on 12/18/2021 48:88:91

## 2021-12-19 ENCOUNTER — Other Ambulatory Visit (HOSPITAL_BASED_OUTPATIENT_CLINIC_OR_DEPARTMENT_OTHER): Payer: Self-pay | Admitting: General Surgery

## 2021-12-19 ENCOUNTER — Encounter (HOSPITAL_BASED_OUTPATIENT_CLINIC_OR_DEPARTMENT_OTHER): Payer: Medicare Other | Attending: Internal Medicine | Admitting: General Surgery

## 2021-12-19 ENCOUNTER — Other Ambulatory Visit: Payer: Self-pay

## 2021-12-19 DIAGNOSIS — E119 Type 2 diabetes mellitus without complications: Secondary | ICD-10-CM | POA: Insufficient documentation

## 2021-12-19 DIAGNOSIS — L89104 Pressure ulcer of unspecified part of back, stage 4: Secondary | ICD-10-CM | POA: Diagnosis not present

## 2021-12-22 NOTE — Progress Notes (Signed)
[name], [name] ([id number]) ?Visit Report for 12/19/2021 ?Biopsy Details ?Patient Name: Date of Service: ?[name], [name] 12/19/2021 2:30 PM ?Medical Record Number: [id number] ?Patient Account Number: [id number] ?Date of Birth/Sex: Treating RN: ?Feb 24, 1923 (88 y.o. Female) Rhae Hammock ?Primary Care Provider: [name] Other Clinician: ?Referring Provider: ?Treating Provider/Extender: Fredirick Maudlin ?[name] ?Weeks in Treatment: 0 ?Biopsy Performed for: Wound #1 Thoracic spine ?Location(s): ?Other: bone ?Performed By: Physician Fredirick Maudlin, MD ?Tissue Punch: No ?Number of Specimens T aken: 2 ?Specimen Sent T Pathology: ?o Yes ?Level of Consciousness (Pre-procedure): Awake and Alert ?Pre-procedure Verification/Time-Out Taken: Yes - 15:47 ?Pain Control: Lidocaine 4% T opical Solution ?Instrument: Rongeur ?Bleeding: Moderate ?Hemostasis Achieved: Silver Nitrate ?Procedural Pain: 0 ?Post Procedural Pain: 0 ?Response to Treatment: Procedure was tolerated well ?Level of Consciousness (Post-procedure): Awake and Alert ?Post Procedure Diagnosis ?Same as Pre-procedure ?Electronic Signature(s) ?Signed: 12/20/2021 6:42:44 PM By: Fredirick Maudlin MD FACS ?Signed: 12/22/2021 12:57:05 PM By: Rhae Hammock RN ?Entered By: Rhae Hammock on 12/20/2021 14:29:11 ?-------------------------------------------------------------------------------- ?Chief Complaint Document Details ?Patient Name: Date of Service: ?[name], [name] 12/19/2021 2:30 PM ?Medical Record Number: [id number] ?Patient Account Number: [id number] ?Date of Birth/Sex: Treating RN: ?April 23, 1923 (88 y.o. Female) ?Primary Care Provider: [name] Other Clinician: ?Referring Provider: ?Treating Provider/Extender: Fredirick Maudlin ?[name] ?Weeks in Treatment: 0 ?Information Obtained from: Patient ?Chief Complaint ?Patient is at the clinic for treatment of an open pressure ulcer ?Electronic Signature(s) ?Signed: 12/20/2021 7:40:05 AM  By: Fredirick Maudlin MD FACS ?Entered By: Fredirick Maudlin on 12/20/2021 07:40:04 ?-------------------------------------------------------------------------------- ?Debridement Details ?Patient Name: ?Date of Service: ?[name], [name] 12/19/2021 2:30 PM ?Medical Record Number: [id number] ?Patient Account Number: [id number] ?Date of Birth/Sex: ?Treating RN: ?[birth year] (88 y.o. Female) Rhae Hammock ?Primary Care Provider: [name] ?Other Clinician: ?Referring Provider: ?Treating Provider/Extender: Fredirick Maudlin ?[name] ?Weeks in Treatment: 0 ?Debridement Performed for Assessment: Wound #1 Thoracic spine ?Performed By: Physician Fredirick Maudlin, MD ?Debridement Type: Debridement ?Level of Consciousness (Pre-procedure): Awake and Alert ?Pre-procedure Verification/Time Out Yes - 15:45 ?Taken: ?Start Time: 15:45 ?Pain Control: Lidocaine ?T Area Debrided (L x W): ?otal 2 (cm) x 2.2 (cm) = 4.4 (cm?) ?Tissue and other material debrided: ?Viable, Non-Viable, Bone, Slough, Subcutaneous, Skin: Dermis , Skin: Epidermis, Slough ?Level: Skin/Subcutaneous Tissue/Muscle/Bone ?Debridement Description: Excisional ?Instrument: Curette, Rongeur ?Specimen: Tissue Culture ?Number of Specimens T aken: 2 ?Bleeding: Moderate ?Hemostasis Achieved: Silver Nitrate ?End Time: 15:45 ?Procedural Pain: 0 ?Post Procedural Pain: 0 ?Response to Treatment: Procedure was tolerated well ?Level of Consciousness (Post- Awake and Alert ?procedure): ?Post Debridement Measurements of Total Wound ?Length: (cm) 2 ?Stage: Unstageable/Unclassified ?Width: (cm) 2.2 ?Depth: (cm) 1.5 ?Volume: (cm?) 5.184 ?Character of Wound/Ulcer Post Debridement: Improved ?Post Procedure Diagnosis ?Same as Pre-procedure ?Electronic Signature(s) ?Signed: 12/20/2021 6:42:44 PM By: Fredirick Maudlin MD FACS ?Signed: 12/22/2021 12:57:05 PM By: Rhae Hammock RN ?Entered By: Rhae Hammock on 12/20/2021  15:24:00 ?-------------------------------------------------------------------------------- ?HPI Details ?Patient Name: ?Date of Service: ?[name], [name] 12/19/2021 2:30 PM ?Medical Record Number: [id number] ?Patient Account Number: [id number] ?Date of Birth/Sex: ?Treating RN: ?March 13, 1923 (88 y.o. Female) ?Primary Care Provider: [name] ?Other Clinician: ?Referring Provider: ?Treating Provider/Extender: Fredirick Maudlin ?[name] ?Weeks in Treatment: 0 ?History of Present Illness ?HPI Description: ADMISSION ?12/19/21 ?This is a 88 year old woman who is presenting to clinic today with a an open ulcer over her thoracic spine. She has severe kyphosis and a history of prior T12 ?fracture, as well as type 2 diabetes mellitus. The wound has been present for about a year. She is accompanied  by her daughters, 1 of whom is a Marine scientist. They ?have been trying to offload the site by using pillows and supports to keep returned while in bed, and an eggcrate foam with a cut out for the wound for when ?she is sitting up. They have been applying Santyl to the site. She does have home health and the provider took a culture which was positive for Pseudomonas. ?She was on ciprofloxacin for this. A recent repeat culture was negative. Most recent hemoglobin A1c was 6.5, in February. A C-reactive protein also obtained in ?February was elevated at 24.1. No imaging has been performed of the site. The patient denies significant pain. ?Electronic Signature(s) ?Signed: 12/20/2021 7:46:22 AM By: Fredirick Maudlin MD FACS ?Previous Signature: 12/20/2021 7:45:52 AM Version By: Fredirick Maudlin MD FACS ?Entered By: Fredirick Maudlin on 12/20/2021 07:46:22 ?-------------------------------------------------------------------------------- ?Physical Exam Details ?Patient Name: Date of Service: ?[name], [name] 12/19/2021 2:30 PM ?Medical Record Number: [id number] ?Patient Account Number: [id number] ?Date of Birth/Sex: Treating RN: ?1923-07-21 (88  y.o. Female) ?Primary Care Provider: [name] Other Clinician: ?Referring Provider: ?Treating Provider/Extender: Fredirick Maudlin ?[name] ?Weeks in Treatment: 0 ?Constitutional ?. . . No acute distress. 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 The wound is ?deep and undermined in all directions. Exposed bone is palpable. ?Electronic Signature(s) ?Signed: 12/20/2021 7:48:00 AM By: Fredirick Maudlin MD FACS ?Entered By: Fredirick Maudlin on 12/20/2021 07:48:00 ?-------------------------------------------------------------------------------- ?Physician Orders Details ?Patient Name: Date of Service: ?[name], [name] 12/19/2021 2:30 PM ?Medical Record Number: [id number] ?Patient Account Number: [id number] ?Date of Birth/Sex: Treating RN: ?August 20, 1923 (88 y.o. Female) Rhae Hammock ?Primary Care Provider: [name] Other Clinician: ?Referring Provider: ?Treating Provider/Extender: Fredirick Maudlin ?[name] ?Weeks in Treatment: 0 ?Verbal / Phone Orders: No ?Diagnosis Coding ?ICD-10 Coding ?Code Description ?L89.104 Pressure ulcer of unspecified part of back, stage 4 ?E11.622 Type 2 diabetes mellitus with other skin ulcer ?S22.089S Unspecified fracture of T11-T12 vertebra, sequela ?Follow-up Appointments ?ppointment in 1 week. - Dr. Celine Ahr ?Return A ?Bathing/ Shower/ Hygiene ?May shower with protection but do not get wound dressing(s) wet. ?Off-Loading ?Turn and reposition every 2 hours ?Other: - KEEP PRESSURE OFF OF BACK ?MAY USE EGG CRATE CUSHION ?Home Health ?New wound care orders this week; continue Home Health for wound care. May utilize formulary equivalent dressing for wound treatment ?orders unless otherwise  specified. - [facility] to change 2-3 x a week. Daughter to change all the other days. ?Wound Treatment ?Wound #1 - Thoracic spine ?Cleanser: Soap and Water (Home Health) 1 x Per Day/15 Days ?Discharge Instructions: May shower and wash wound with dial antibacteri

## 2021-12-22 NOTE — Progress Notes (Signed)
URVI, IMES ([id number]) ?Visit Report for 12/19/2021 ?Abuse Risk Screen Details ?Patient Name: Date of Service: ?[name], [name] 12/19/2021 2:30 PM ?Medical Record Number: [id number] ?Patient Account Number: [id number] ?Date of Birth/Sex: Treating RN: ?Jul 14, 1923 (88 y.o. Female) Rhae Hammock ?Primary Care Placida Cambre: [name] Other Clinician: ?Referring Frayda Egley: ?Treating Jena Tegeler/Extender: Fredirick Maudlin ?[name] ?Weeks in Treatment: 0 ?Abuse Risk Screen Items ?Answer ?ABUSE RISK SCREEN: ?Has anyone close to you tried to hurt or harm you recentlyo No ?Do you feel uncomfortable with anyone in your familyo No ?Has anyone forced you do things that you didnt want to doo No ?Electronic Signature(s) ?Signed: 12/22/2021 12:57:05 PM By: Rhae Hammock RN ?Entered By: Rhae Hammock on 12/19/2021 15:06:14 ?-------------------------------------------------------------------------------- ?Activities of Daily Living Details ?Patient Name: Date of Service: ?[name], [name] 12/19/2021 2:30 PM ?Medical Record Number: [id number] ?Patient Account Number: [id number] ?Date of Birth/Sex: Treating RN: ?[birth year] (88 y.o. Female) Rhae Hammock ?Primary Care Ica Daye: [name] Other Clinician: ?Referring Shyniece Scripter: ?Treating Traylon Schimming/Extender: Fredirick Maudlin ?[name] ?Weeks in Treatment: 0 ?Activities of Daily Living Items ?Answer ?Activities of Daily Living (Please select one for each item) ?Drive Automobile Not Able ?T Medications ?ake Need Assistance ?Use T elephone Need Assistance ?Care for Appearance Need Assistance ?Use T oilet Need Assistance ?Bath / Shower Need Assistance ?Dress Self Need Assistance ?Feed Self Need Assistance ?Walk Not Able ?Get In / Out Bed Need Assistance ?Housework Need Assistance ?Prepare Meals Need Assistance ?Handle Money Need Assistance ?Shop for Self Need Assistance ?Electronic Signature(s) ?Signed: 12/22/2021 12:57:05 PM By: Rhae Hammock RN ?Entered By:  Rhae Hammock on 12/19/2021 15:39:27 ?-------------------------------------------------------------------------------- ?Education Screening Details ?Patient Name: ?Date of Service: ?[name], [name] 12/19/2021 2:30 PM ?Medical Record Number: [id number] ?Patient Account Number: [id number] ?Date of Birth/Sex: ?Treating RN: ?11/27/1922 (88 y.o. 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 ?Reason Patient is not Primary Learner: left ear w/ hearing aids ?Learning Preferences/Education Level/Primary Language ?Learning Preference: Explanation, Demonstration, Communication Board, Printed Material ?Highest Education Level: College or Above ?Preferred Language: English ?Cognitive Barrier ?Language Barrier: No ?Translator Needed: No ?Memory Deficit: No ?Emotional Barrier: No ?Cultural/Religious Beliefs Affecting Medical Care: No ?Physical Barrier ?Impaired Vision: No ?Impaired Hearing: Yes Complete Loss, Hearing Aid ?Decreased Hand dexterity: No ?Knowledge/Comprehension ?Knowledge Level: High ?Comprehension Level: High ?Ability to understand written instructions: High ?Ability to understand verbal instructions: High ?Motivation ?Anxiety Level: Calm ?Cooperation: Cooperative ?Education Importance: Denies Need ?Interest in Health Problems: Asks Questions ?Perception: Coherent ?Willingness to Engage in Self-Management High ?Activities: ?Readiness to Engage in Self-Management High ?Activities: ?Electronic Signature(s) ?Signed: 12/22/2021 12:57:05 PM By: Rhae Hammock RN ?Entered By: Rhae Hammock on 12/19/2021 15:41:04 ?-------------------------------------------------------------------------------- ?Fall Risk Assessment Details ?Patient Name: ?Date of Service: ?[name], [name] 12/19/2021 2:30  PM ?Medical Record Number: [id number] ?Patient Account Number: [id number] ?Date of Birth/Sex: ?Treating RN: ?[birth year] (88 y.o. Female) Rhae Hammock ?Primary Care Maniya Donovan: [name] ?Other Clinician: ?Referring Mcdaniel Ohms: ?Treating Yusuke Beza/Extender: Fredirick Maudlin ?[name] ?Weeks in Treatment: 0 ?Fall Risk Assessment Items ?Have you had 2 or more falls in the last 12 monthso 0 No ?Have you had any fall that resulted in injury in the last 12 monthso 0 Yes ?FALLS RISK SCREEN ?History of falling - immediate or within 3 months 0 No ?Secondary diagnosis (Do you have 2 or more medical diagnoseso) 15 Yes ?Ambulatory aid ?None/bed rest/wheelchair/nurse 0 Yes ?  Crutches/cane/walker 0 No ?Furniture 0 No ?Intravenous therapy Access/Saline/Heparin Lock 0 No ?Gait/Transferring ?Normal/ bed rest/ wheelchair 0 Yes ?Weak (short steps with or without shuffle, stooped but able to lift head while walking, may seek 0 No ?support from furniture) ?Impaired (short steps with shuffle, may have difficulty arising from chair, head down, impaired 0 No ?balance) ?Mental Status ?Oriented to own ability 0 No ?Electronic Signature(s) ?Signed: 12/22/2021 12:57:05 PM By: Rhae Hammock RN ?Entered By: Rhae Hammock on 12/19/2021 15:06:40 ?-------------------------------------------------------------------------------- ?Foot Assessment Details ?Patient Name: ?Date of Service: ?[name], [name] 12/19/2021 2:30 PM ?Medical Record Number: [id number] ?Patient Account Number: [id number] ?Date of Birth/Sex: ?Treating RN: ?[birth year] (88 y.o. Female) Rhae Hammock ?Primary Care Shi Grose: [name] ?Other Clinician: ?Referring Junah Yam: ?Treating Faun Mcqueen/Extender: Fredirick Maudlin ?[name] ?Weeks in Treatment: 0 ?Foot Assessment Items ?Site Locations ?+ = Sensation present, - = Sensation absent, C = Callus, U = Ulcer ?R = Redness, W = Warmth, M = Maceration, PU = Pre-ulcerative lesion ?F = Fissure, S = Swelling, D =  Dryness ?Assessment ?Right: Left: ?Other Deformity: No No ?Prior Foot Ulcer: No No ?Prior Amputation: No No ?Charcot Joint: No No ?Ambulatory Status: ?Gait: ?Notes ?n/a pt. not diabetic and no le woundsss ?Electronic Signature(s) ?Signed: 12/22/2021 12:57:05 PM By: Rhae Hammock RN ?Entered By: Rhae Hammock on 12/19/2021 15:07:14 ?-------------------------------------------------------------------------------- ?Nutrition Risk Screening Details ?Patient Name: ?Date of Service: ?[name], [name] 12/19/2021 2:30 PM ?Medical Record Number: [id number] ?Patient Account Number: [id number] ?Date of Birth/Sex: ?Treating RN: ?1923/02/20 (88 y.o. Female) Rhae Hammock ?Primary Care Brieana Shimmin: [name] ?Other Clinician: ?Referring Charlese Gruetzmacher: ?Treating Chelise Hanger/Extender: Fredirick Maudlin ?[name] ?Weeks in Treatment: 0 ?Height (in): 62 ?Weight (lbs): 110 ?Body Mass Index (BMI): 20.1 ?Nutrition Risk Screening Items ?Score Screening ?NUTRITION RISK SCREEN: ?I have an illness or condition that made me change the kind and/or amount of food I eat 0 No ?I eat fewer than two meals per day 0 No ?I eat few fruits and vegetables, or milk products 0 No ?I have three or more drinks of beer, liquor or wine almost every day 0 No ?I have tooth or mouth problems that make it hard for me to eat 0 No ?I don't always have enough money to buy the food I need 0 No ?I eat alone most of the time 0 No ?I take three or more different prescribed or over-the-counter drugs a day 0 No ?Without wanting to, I have lost or gained 10 pounds in the last six months 0 No ?I am not always physically able to shop, cook and/or feed myself 0 No ?Nutrition Protocols ?Good Risk Protocol 0 No interventions needed ?Moderate Risk Protocol ?High Risk Proctocol ?Risk Level: Good Risk ?Score: 0 ?Electronic Signature(s) ?Signed: 12/22/2021 12:57:05 PM By: Rhae Hammock RN ?Entered By: Rhae Hammock on 12/19/2021 15:06:54 ?

## 2021-12-24 LAB — AEROBIC/ANAEROBIC CULTURE W GRAM STAIN (SURGICAL/DEEP WOUND): Gram Stain: NONE SEEN

## 2021-12-25 ENCOUNTER — Other Ambulatory Visit: Payer: Self-pay

## 2021-12-25 ENCOUNTER — Encounter (HOSPITAL_BASED_OUTPATIENT_CLINIC_OR_DEPARTMENT_OTHER): Payer: Medicare Other | Admitting: General Surgery

## 2021-12-25 DIAGNOSIS — L89104 Pressure ulcer of unspecified part of back, stage 4: Secondary | ICD-10-CM | POA: Diagnosis not present

## 2021-12-26 NOTE — Progress Notes (Signed)
[name], [name] ([id number]) ?Visit Report for 12/25/2021 ?Arrival Information Details ?Patient Name: Date of Service: ?[name], [name] 12/25/2021 3:30 PM ?Medical Record Number: [id number] ?Patient Account Number: [id number] ?Date of Birth/Sex: Treating RN: ?[birth year] (88 y.o. F) Boehlein, [name] ?Primary Care Gena Laski: [name] ?Other Clinician: Donavan Burnet ?Referring Yovanny Coats: ?Treating Khori Rosevear/Extender: Fredirick Maudlin ?[name] ?Weeks in Treatment: 0 ?Visit Information History Since Last Visit ?All ordered tests and consults were completed: Yes ?Patient Arrived: Wheel Chair ?Added or deleted any medications: No ?Arrival Time: 15:32 ?Any new allergies or adverse reactions: No ?Accompanied By: family ?Had a fall or experienced change in No ?Transfer Assistance: Other ?activities of daily living that may affect ?Patient Identification Verified: Yes ?risk of falls: ?Secondary Verification Process Completed: Yes ?Signs or symptoms of abuse/neglect since last visito No ?Patient Requires Transmission-Based Precautions: No ?Hospitalized since last visit: No ?Patient Has Alerts: No ?Implantable device outside of the clinic excluding No ?cellular tissue based products placed in the center ?since last visit: ?Pain Present Now: Yes ?Electronic Signature(s) ?Signed: 12/26/2021 4:12:57 PM By: Donavan Burnet CHT EMT BS ?, , ?Entered By: Donavan Burnet on 12/25/2021 15:39:53 ?-------------------------------------------------------------------------------- ?Encounter Discharge Information Details ?Patient Name: Date of Service: ?[name], [name] 12/25/2021 3:30 PM ?Medical Record Number: [id number] ?Patient Account Number: [id number] ?Date of Birth/Sex: Treating RN: ?1923/09/18 (88 y.o. F) Boehlein, [name] ?Primary Care Magan Winnett: [name] Other Clinician: ?Referring Taneka Espiritu: ?Treating Khaleed Holan/Extender: Fredirick Maudlin ?[name] ?Weeks in Treatment: 0 ?Encounter Discharge Information Items  Post Procedure Vitals ?Discharge Condition: Stable ?Temperature (F): 98.5 ?Ambulatory Status: Wheelchair ?Pulse (bpm): 83 ?Discharge Destination: Home ?Respiratory Rate (breaths/min): 18 ?Transportation: Private Auto ?Blood Pressure (mmHg): 140/74 ?Accompanied By: daughter ?Schedule Follow-up Appointment: Yes ?Clinical Summary of Care: Patient Declined ?Electronic Signature(s) ?Signed: 12/25/2021 6:11:59 PM By: Baruch Gouty RN, BSN ?Entered By: Baruch Gouty on 12/25/2021 16:47:47 ?-------------------------------------------------------------------------------- ?Lower Extremity Assessment Details ?Patient Name: ?Date of Service: ?[name], [name] 12/25/2021 3:30 PM ?Medical Record Number: [id number] ?Patient Account Number: [id number] ?Date of Birth/Sex: ?Treating RN: ?1922/12/18 (88 y.o. F) Boehlein, [name] ?Primary Care Angella Montas: [name] ?Other Clinician: ?Referring Athea Haley: ?Treating Abbegayle Denault/Extender: Fredirick Maudlin ?[name] ?Weeks in Treatment: 0 ?Electronic Signature(s) ?Signed: 12/25/2021 6:11:59 PM By: Baruch Gouty RN, BSN ?Entered By: Baruch Gouty on 12/25/2021 16:12:25 ?-------------------------------------------------------------------------------- ?Multi Wound Chart Details ?Patient Name: ?Date of Service: ?[name], [name] 12/25/2021 3:30 PM ?Medical Record Number: [id number] ?Patient Account Number: [id number] ?Date of Birth/Sex: ?Treating RN: ?[birth year] (88 y.o. F) Boehlein, [name] ?Primary Care Talita Recht: [name] ?Other Clinician: ?Referring Kohana Amble: ?Treating Lacoya Wilbanks/Extender: Fredirick Maudlin ?[name] ?Weeks in Treatment: 0 ?Vital Signs ?Height(in): 62 ?Pulse(bpm): 83 ?Weight(lbs): 110 ?Blood Pressure(mmHg): 140/74 ?Body Mass Index(BMI): 20.1 ?Temperature(??F): 98.5 ?Respiratory Rate(breaths/min): 18 ?Photos: [1:No Photos Thoracic spine] [N/A:N/A N/A] ?Wound Location: [1:Gradually Appeared] [N/A:N/A] ?Wounding Event: [1:Pressure Ulcer] [N/A:N/A] ?Primary  Etiology: [1:Hypertension, Type II Diabetes] [N/A:N/A] ?Comorbid History: [1:12/19/2020] [N/A:N/A] ?Date Acquired: [1:0] [N/A:N/A] ?Weeks of Treatment: [1:Open] [N/A:N/A] ?Wound Status: [1:No] [N/A:N/A] ?Wound Recurrence: [1:2.4x1.7x1.2] [N/A:N/A] ?Measurements L x W x D (cm) [1:3.204] [N/A:N/A] ?A (cm?) : ?rea [1:3.845] [N/A:N/A] ?Volume (cm?) : [1:7.30%] [N/A:N/A] ?% Reduction in A rea: [1:25.80%] [N/A:N/A] ?% Reduction in Volume: [1:12] ?Starting Position 1 (o'clock): [1:12] ?Ending Position 1 (o'clock): [1:1.8] ?Maximum Distance 1 (cm): [1:Yes] [N/A:N/A] ?Undermining: [1:Category/Stage IV] [N/A:N/A] ?Classification: [1:Large] [N/A:N/A] ?Exudate A mount: [1:Serosanguineous] [N/A:N/A] ?Exudate Type: [1:red, brown] [N/A:N/A] ?Exudate Color: [1:Distinct, outline attached] [N/A:N/A] ?Wound Margin: [1:Small (1-33%)] [N/A:N/A] ?Granulation A mount: [1:Red, Pink] [N/A:N/A] ?Granulation Quality: [1:Large (67-100%)] [N/A:N/A] ?Necrotic A  mount: ?[1:Fat Layer (Subcutaneous Tissue): Yes N/A] ?Exposed Structures: ?[1:Bone: Yes Fascia: No Tendon: No Muscle: No Joint: No None] [N/A:N/A] ?Epithelialization: [1:Debridement - Excisional] [N/A:N/A] ?Debridement: [1:16:20] [N/A:N/A] ?Pre-procedure Verification/Time Out ?Taken: [1:Lidocaine 5% topical ointment] [N/A:N/A] ?Pain Control: [1:Subcutaneous, Slough] [N/A:N/A] ?Tissue Debrided: [1:Skin/Subcutaneous Tissue] [N/A:N/A] ?Level: [1:4.08] [N/A:N/A] ?Debridement A (sq cm): [1:rea Curette] [N/A:N/A] ?Instrument: [1:Minimum] [N/A:N/A] ?Bleeding: [1:Pressure] [N/A:N/A] ?Hemostasis A chieved: [1:7] [N/A:N/A] ?Procedural Pain: [1:3] [N/A:N/A] ?Post Procedural Pain: [1:Procedure was tolerated well] [N/A:N/A] ?Debridement Treatment Response: [1:2.4x1.7x1.2] [N/A:N/A] ?Post Debridement Measurements L x ?W x D (cm) [1:3.845] [N/A:N/A] ?Post Debridement Volume: (cm?) [1:Category/Stage IV] [N/A:N/A] ?Post Debridement Stage: [1:Debridement] [N/A:N/A] ?Treatment Notes ?Electronic  Signature(s) ?Signed: 12/25/2021 4:39:59 PM By: Fredirick Maudlin MD FACS ?Signed: 12/25/2021 6:11:59 PM By: Baruch Gouty RN, BSN ?Entered By: Fredirick Maudlin on 12/25/2021 16:39:59 ?-------------------------------------------------------------------------------- ?Multi-Disciplinary Care Plan Details ?Patient Name: ?Date of Service: ?[name], [name] 12/25/2021 3:30 PM ?Medical Record Number: [id number] ?Patient Account Number: [id number] ?Date of Birth/Sex: ?Treating RN: ?23-Jan-1923 (88 y.o. F) Boehlein, [name] ?Primary Care Esabella Stockinger: [name] ?Other Clinician: ?Referring Ara Mano: ?Treating Gordon Carlson/Extender: Fredirick Maudlin ?[name] ?Weeks in Treatment: 0 ?Multidisciplinary Care Plan reviewed with physician ?Active Inactive ?Pressure ?Nursing Diagnoses: ?Knowledge deficit related to causes and risk factors for pressure ulcer development ?Knowledge deficit related to management of pressures ulcers ?Potential for impaired tissue integrity related to pressure, friction, moisture, and shear ?Goals: ?Patient will remain free of pressure ulcers ?Date Initiated: 12/25/2021 ?Target Resolution Date: 01/22/2022 ?Goal Status: Active ?Patient/caregiver will verbalize understanding of pressure ulcer management ?Date Initiated: 12/25/2021 ?Target Resolution Date: 01/22/2022 ?Goal Status: Active ?Interventions: ?Assess: immobility, friction, shearing, incontinence upon admission and as needed ?Assess offloading mechanisms upon admission and as needed ?Notes: ?Wound/Skin Impairment ?Nursing Diagnoses: ?Impaired tissue integrity ?Knowledge deficit related to ulceration/compromised skin integrity ?Goals: ?Patient will have a decrease in wound volume by X% from date: (specify in notes) ?Date Initiated: 12/20/2021 ?Target Resolution Date: 01/12/2022 ?Goal Status: Active ?Patient/caregiver will verbalize understanding of skin care regimen ?Date Initiated: 12/20/2021 ?Target Resolution Date: 01/12/2022 ?Goal Status:  Active ?Ulcer/skin breakdown will have a volume reduction of 30% by week 4 ?Date Initiated: 12/20/2021 ?Target Resolution Date: 01/13/2022 ?Goal Status: Active ?Ulcer/skin breakdown will have a volume reduction of 50% by week 8 ?Date Initi

## 2021-12-26 NOTE — Progress Notes (Signed)
RONAN, DION ([id number]) ?Visit Report for 12/25/2021 ?Chief Complaint Document Details ?Patient Name: Date of Service: ?[name], [name] 12/25/2021 3:30 PM ?Medical Record Number: [id number] ?Patient Account Number: [id number] ?Date of Birth/Sex: Treating RN: ?May 20, 1923 (88 y.o. F) Boehlein, [name] ?Primary Care Provider: [name] Other Clinician: ?Referring Provider: ?Treating Provider/Extender: Fredirick Maudlin ?[name] ?Weeks in Treatment: 0 ?Information Obtained from: Patient ?Chief Complaint ?Patient is at the clinic for treatment of an open pressure ulcer ?Electronic Signature(s) ?Signed: 12/25/2021 4:40:08 PM By: Fredirick Maudlin MD FACS ?Entered By: Fredirick Maudlin on 12/25/2021 16:40:08 ?-------------------------------------------------------------------------------- ?Debridement Details ?Patient Name: Date of Service: ?[name], [name] 12/25/2021 3:30 PM ?Medical Record Number: [id number] ?Patient Account Number: [id number] ?Date of Birth/Sex: Treating RN: ?[birth year] (88 y.o. F) Boehlein, [name] ?Primary Care Provider: [name] Other Clinician: ?Referring Provider: ?Treating Provider/Extender: Fredirick Maudlin ?[name] ?Weeks in Treatment: 0 ?Debridement Performed for Assessment: Wound #1 Thoracic spine ?Performed By: Physician Fredirick Maudlin, MD ?Debridement Type: Debridement ?Level of Consciousness (Pre-procedure): Awake and Alert ?Pre-procedure Verification/Time Out Yes - 16:20 ?Taken: ?Start Time: 16:23 ?Pain Control: Lidocaine 5% topical ointment ?T Area Debrided (L x W): ?otal 2.4 (cm) x 1.7 (cm) = 4.08 (cm?) ?Tissue and other material debrided: Viable, Non-Viable, Slough, Subcutaneous, Slough ?Level: Skin/Subcutaneous Tissue ?Debridement Description: Excisional ?Instrument: Curette ?Bleeding: Minimum ?Hemostasis Achieved: Pressure ?Procedural Pain: 7 ?Post Procedural Pain: 3 ?Response to Treatment: Procedure was tolerated well ?Level of Consciousness (Post- Awake and  Alert ?procedure): ?Post Debridement Measurements of Total Wound ?Length: (cm) 2.4 ?Stage: Category/Stage IV ?Width: (cm) 1.7 ?Depth: (cm) 1.2 ?Volume: (cm?) 3.845 ?Character of Wound/Ulcer Post Debridement: Requires Further Debridement ?Post Procedure Diagnosis ?Same as Pre-procedure ?Electronic Signature(s) ?Signed: 12/25/2021 6:11:59 PM By: Baruch Gouty RN, BSN ?Signed: 12/26/2021 5:13:50 PM By: Fredirick Maudlin MD FACS ?Entered By: Baruch Gouty on 12/25/2021 16:28:05 ?-------------------------------------------------------------------------------- ?HPI Details ?Patient Name: Date of Service: ?[name], [name] 12/25/2021 3:30 PM ?Medical Record Number: [id number] ?Patient Account Number: [id number] ?Date of Birth/Sex: Treating RN: ?1922-12-25 (88 y.o. F) Boehlein, [name] ?Primary Care Provider: [name] Other Clinician: ?Referring Provider: ?Treating Provider/Extender: Fredirick Maudlin ?[name] ?Weeks in Treatment: 0 ?History of Present Illness ?HPI Description: ADMISSION ?12/19/21 ?This is a 88 year old woman who is presenting to clinic today with a an open ulcer over her thoracic spine. She has severe kyphosis and a history of prior T12 ?fracture, as well as type 2 diabetes mellitus. The wound has been present for about a year. She is accompanied by her daughters, 1 of whom is a Marine scientist. They ?have been trying to offload the site by using pillows and supports to keep returned while in bed, and an eggcrate foam with a cut out for the wound for when ?she is sitting up. They have been applying Santyl to the site. She does have home health and the provider took a culture which was positive for Pseudomonas. ?She was on ciprofloxacin for this. A recent repeat culture was negative. Most recent hemoglobin A1c was 6.5, in February. A C-reactive protein also obtained in ?February was elevated at 24.1. No imaging has been performed of the site. The patient denies significant pain. ?12/25/2021: Last week, I took  a bone biopsy as well as culture. Fortunately, the bone biopsy was negative for osteomyelitis. The culture returned with rare ?corynebacterium and rare Enterococcus faecalis. We have been using Santyl and Hydrofera Blue. T oday, the wound appears cleaner and there are buds of ?granulation tissue forming around the perimeter. ?Electronic Signature(s) ?Signed: 12/25/2021 4:41:52 PM  By: Fredirick Maudlin MD FACS ?Entered By: Fredirick Maudlin on 12/25/2021 16:41:52 ?-------------------------------------------------------------------------------- ?Physical Exam Details ?Patient Name: Date of Service: ?[name], [name] 12/25/2021 3:30 PM ?Medical Record Number: [id number] ?Patient Account Number: [id number] ?Date of Birth/Sex: Treating RN: ?03/01/1923 (88 y.o. F) Boehlein, [name] ?Primary Care Provider: [name] Other Clinician: ?Referring Provider: ?Treating Provider/Extender: Fredirick Maudlin ?[name] ?Weeks in Treatment: 0 ?Constitutional ?. . . . No acute distress. ?Respiratory ?Normal work of breathing on room air. ?Notes ?12/25/2021: Wound examthere is significantly less fibrinous slough in the wound. The undermining persists in all directions and the exposed bone is palpable at ?12:00 underneath some overlying tissue. The wound base itself is pink and there is granulation tissue forming around the periphery. ?Electronic Signature(s) ?Signed: 12/25/2021 4:50:02 PM By: Fredirick Maudlin MD FACS ?Entered By: Fredirick Maudlin on 12/25/2021 16:50:01 ?-------------------------------------------------------------------------------- ?Physician Orders Details ?Patient Name: ?Date of Service: ?[name], [name] 12/25/2021 3:30 PM ?Medical Record Number: [id number] ?Patient Account Number: [id number] ?Date of Birth/Sex: ?Treating RN: ?[birth year] (88 y.o. F) Boehlein, [name] ?Primary Care Provider: [name] ?Other Clinician: ?Referring Provider: ?Treating Provider/Extender: Fredirick Maudlin ?[name] ?Weeks in  Treatment: 0 ?Verbal / Phone Orders: No ?Diagnosis Coding ?ICD-10 Coding ?Code Description ?L89.104 Pressure ulcer of unspecified part of back, stage 4 ?E11.622 Type 2 diabetes mellitus with other skin ulcer ?S22.089S Unspecified fracture of T11-T12 vertebra, sequela ?Follow-up Appointments ?ppointment in 1 week. - Dr. Celine Ahr with [facility] ?Return A ?Bathing/ Shower/ Hygiene ?May shower with protection but do not get wound dressing(s) wet. ?Off-Loading ?Turn and reposition every 2 hours ?Other: - KEEP PRESSURE OFF OF BACK ?MAY USE EGG CRATE CUSHION ?Home Health ?New wound care orders this week; continue Home Health for wound care. May utilize formulary equivalent dressing for wound treatment ?orders unless otherwise specified. - [place] to change 2-3 x a week. Daughter to change all the other days. ?Wound Treatment ?Wound #1 - Thoracic spine ?Cleanser: Soap and Water (Home Health) 1 x Per Day/15 Days ?Discharge Instructions: May shower and wash wound with dial antibacterial soap and water prior to dressing change. ?Cleanser: Wound Cleanser (Home Health) (Generic) 1 x Per Day/15 Days ?Discharge Instructions: Cleanse the wound with wound cleanser prior to applying a clean dressing using gauze sponges, not tissue or cotton balls. ?Peri-Wound Care: Skin Prep (Home Health) 1 x Per Day/15 Days ?Discharge Instructions: Use skin prep as directed ?Topical: Gentamicin 1 x Per Day/15 Days ?Discharge Instructions: thin layer to wound bed under hydrofera ?Prim Dressing: Hydrofera Blue Classic Foam, 4x4 in (Home Health) 1 x Per Day/15 Days ?ary ?Discharge Instructions: Moisten with saline prior to applying to wound bed ?Secondary Dressing: Zetuvit Plus 4x8 in [facility]) 1 x Per Day/15 Days ?Discharge Instructions: Apply over primary dressing as directed. ?Secured With: 39M Medipore H Soft Cloth Surgical T ape, 4 x 10 (in/yd) (Home Health) 1 x Per Day/15 Days ?Discharge Instructions: Secure with tape as directed. ?Patient  Medications ?llergies: sulfamethoxazole ?A ?Notifications Medication Indication Start End ?12/25/2021 ?gentamicin ?DOSE topical 0.1 % ointment - apply to wound bed with each dressing change ?Electronic Signature(s

## 2021-12-27 ENCOUNTER — Encounter (HOSPITAL_BASED_OUTPATIENT_CLINIC_OR_DEPARTMENT_OTHER): Payer: Medicare Other | Admitting: General Surgery

## 2022-01-01 ENCOUNTER — Other Ambulatory Visit: Payer: Self-pay

## 2022-01-01 ENCOUNTER — Encounter (HOSPITAL_BASED_OUTPATIENT_CLINIC_OR_DEPARTMENT_OTHER): Payer: Medicare Other | Admitting: General Surgery

## 2022-01-01 DIAGNOSIS — L89104 Pressure ulcer of unspecified part of back, stage 4: Secondary | ICD-10-CM | POA: Diagnosis not present

## 2022-01-01 NOTE — Progress Notes (Signed)
[name], [name] ([id number]) ?Visit Report for 01/01/2022 ?Chief Complaint Document Details ?Patient Name: Date of Service: ?[name], [name] 01/01/2022 2:45 PM ?Medical Record Number: [id number] ?Patient Account Number: [id number] ?Date of Birth/Sex: Treating RN: ?May 25, 1923 (88 y.o. F) Boehlein, [name] ?Primary Care Provider: [name] Other Clinician: ?Referring Provider: ?Treating Provider/Extender: Fredirick Maudlin ?[name] ?Weeks in Treatment: 1 ?Information Obtained from: Patient ?Chief Complaint ?Patient is at the clinic for treatment of an open pressure ulcer ?Electronic Signature(s) ?Signed: 01/01/2022 3:26:52 PM By: Fredirick Maudlin MD FACS ?Entered By: Fredirick Maudlin on 01/01/2022 15:26:52 ?-------------------------------------------------------------------------------- ?Debridement Details ?Patient Name: Date of Service: ?[name], [name] 01/01/2022 2:45 PM ?Medical Record Number: [id number] ?Patient Account Number: [id number] ?Date of Birth/Sex: Treating RN: ?1923/01/28 (88 y.o. F) Boehlein, [name] ?Primary Care Provider: [name] Other Clinician: ?Referring Provider: ?Treating Provider/Extender: Fredirick Maudlin ?[name] ?Weeks in Treatment: 1 ?Debridement Performed for Assessment: Wound #1 Thoracic spine ?Performed By: Physician Fredirick Maudlin, MD ?Debridement Type: Debridement ?Level of Consciousness (Pre-procedure): Awake and Alert ?Pre-procedure Verification/Time Out Yes - 15:15 ?Taken: ?Start Time: 15:16 ?Pain Control: Lidocaine 5% topical ointment ?T Area Debrided (L x W): ?otal 2 (cm) x 1.4 (cm) = 2.8 (cm?) ?Tissue and other material debrided: Viable, Non-Viable, Slough, Subcutaneous, Slough ?Level: Skin/Subcutaneous Tissue ?Debridement Description: Excisional ?Instrument: Curette ?Bleeding: Minimum ?Hemostasis Achieved: Pressure ?Procedural Pain: 2 ?Post Procedural Pain: 1 ?Response to Treatment: Procedure was tolerated well ?Level of Consciousness (Post- Awake and  Alert ?procedure): ?Post Debridement Measurements of Total Wound ?Length: (cm) 2 ?Stage: Category/Stage IV ?Width: (cm) 1.4 ?Depth: (cm) 1 ?Volume: (cm?) 2.199 ?Character of Wound/Ulcer Post Debridement: Requires Further Debridement ?Post Procedure Diagnosis ?Same as Pre-procedure ?Electronic Signature(s) ?Signed: 01/01/2022 4:20:36 PM By: Fredirick Maudlin MD FACS ?Signed: 01/01/2022 4:47:17 PM By: Baruch Gouty RN, BSN ?Entered By: Baruch Gouty on 01/01/2022 15:19:22 ?-------------------------------------------------------------------------------- ?HPI Details ?Patient Name: Date of Service: ?[name], [name] 01/01/2022 2:45 PM ?Medical Record Number: [id number] ?Patient Account Number: [id number] ?Date of Birth/Sex: Treating RN: ?Feb 20, 1923 (88 y.o. F) Boehlein, [name] ?Primary Care Provider: [name] Other Clinician: ?Referring Provider: ?Treating Provider/Extender: Fredirick Maudlin ?[name] ?Weeks in Treatment: 1 ?History of Present Illness ?HPI Description: ADMISSION ?12/19/21 ?This is a 88 year old woman who is presenting to clinic today with a an open ulcer over her thoracic spine. She has severe kyphosis and a history of prior T12 ?fracture, as well as type 2 diabetes mellitus. The wound has been present for about a year. She is accompanied by her daughters, 1 of whom is a Marine scientist. They ?have been trying to offload the site by using pillows and supports to keep returned while in bed, and an eggcrate foam with a cut out for the wound for when ?she is sitting up. They have been applying Santyl to the site. She does have home health and the provider took a culture which was positive for Pseudomonas. ?She was on ciprofloxacin for this. A recent repeat culture was negative. Most recent hemoglobin A1c was 6.5, in February. A C-reactive protein also obtained in ?February was elevated at 24.1. No imaging has been performed of the site. The patient denies significant pain. ?12/25/2021: Last week, I took a  bone biopsy as well as culture. Fortunately, the bone biopsy was negative for osteomyelitis. The culture returned with rare ?corynebacterium and rare Enterococcus faecalis. We have been using Santyl and Hydrofera Blue. T oday, the wound appears cleaner and there are buds of ?granulation tissue forming around the perimeter. ?01/01/2022: Due to the positive culture,  we switched to topical gentamicin. Today, she has had a little bit of a slough accumulation. The wound continues to have ?buds of granulation tissue forming. Apparently, the Hydrofera Blue was placed just over the wound opening rather than down in to contact the surface. No ?purulent drainage or odor. ?Electronic Signature(s) ?Signed: 01/01/2022 3:27:45 PM By: Fredirick Maudlin MD FACS ?Entered By: Fredirick Maudlin on 01/01/2022 15:27:45 ?-------------------------------------------------------------------------------- ?Physical Exam Details ?Patient Name: Date of Service: ?[name], [name] 01/01/2022 2:45 PM ?Medical Record Number: [id number] ?Patient Account Number: [id number] ?Date of Birth/Sex: Treating RN: ?Sep 06, 1923 (88 y.o. F) Boehlein, [name] ?Primary Care Provider: [name] Other Clinician: ?Referring Provider: ?Treating Provider/Extender: Fredirick Maudlin ?[name] ?Weeks in Treatment: 1 ?Constitutional ?. . . . No acute distress. ?Respiratory ?Normal work of breathing on room air. ?Notes ?01/01/2022: Wound examthere has been a bit of a buildup of fibrinous slough since last week, but overall the wound appears fairly clean. Granulation tissue is ?forming around the periphery as well as buds coming up on the wound surface. No odor or significant drainage. ?Electronic Signature(s) ?Signed: 01/01/2022 3:29:07 PM By: Fredirick Maudlin MD FACS ?Entered By: Fredirick Maudlin on 01/01/2022 15:29:07 ?-------------------------------------------------------------------------------- ?Physician Orders Details ?Patient Name: ?Date of Service: ?[name], [name] 01/01/2022 2:45 PM ?Medical Record Number: [id number] ?Patient Account Number: [id number] ?Date of Birth/Sex: ?Treating RN: ?Jan 23, 1923 (88 y.o. F) Boehlein, [name] ?Primary Care Provider: [name] ?Other Clinician: ?Referring Provider: ?Treating Provider/Extender: Fredirick Maudlin ?[name] ?Weeks in Treatment: 1 ?Verbal / Phone Orders: No ?Diagnosis Coding ?ICD-10 Coding ?Code Description ?L89.104 Pressure ulcer of unspecified part of back, stage 4 ?E11.622 Type 2 diabetes mellitus with other skin ulcer ?S22.089S Unspecified fracture of T11-T12 vertebra, sequela ?Follow-up Appointments ?ppointment in 1 week. - Dr. Celine Ahr with [facility] ?Return A ?Bathing/ Shower/ Hygiene ?May shower with protection but do not get wound dressing(s) wet. ?Off-Loading ?Turn and reposition every 2 hours ?Other: - KEEP PRESSURE OFF OF BACK ?MAY USE EGG CRATE CUSHION ?Home Health ?New wound care orders this week; continue Home Health for wound care. May utilize formulary equivalent dressing for wound treatment ?orders unless otherwise specified. - [place] to change 2-3 x a week. Daughter to change all the other days. ?Wound Treatment ?Wound #1 - Thoracic spine ?Cleanser: Normal Saline (DME) (Generic) 1 x Per Day/30 Days ?Discharge Instructions: Cleanse the wound with Normal Saline prior to applying a clean dressing using gauze sponges, not tissue or cotton balls. ?Cleanser: Soap and Water 1 x Per Day/30 Days ?Discharge Instructions: May shower and wash wound with dial antibacterial soap and water prior to dressing change. ?Cleanser: Wound Cleanser (Generic) 1 x Per Day/30 Days ?Discharge Instructions: Cleanse the wound with wound cleanser prior to applying a clean dressing using gauze sponges, not tissue or cotton balls. ?Peri-Wound Care: Skin Prep 1 x Per Day/30 Days ?Discharge Instructions: Use skin prep as directed ?Prim Dressing: Hydrofera Blue Classic Foam, 4x4 in (DME) (Dispense As Written) 1 x Per Day/30  Days ?ary ?Discharge Instructions: Moisten with saline prior to applying to wound bed. Be sure to tuck into undermining at 12 0clock ?Prim Dressing: Santyl Ointment 1 x Per Day/30 Days ?ary ?Discharge Instructions: Apply

## 2022-01-01 NOTE — Progress Notes (Signed)
[name], [name] ([id number]) ?Visit Report for 01/01/2022 ?Arrival Information Details ?Patient Name: Date of Service: ?[name], [name] 01/01/2022 2:45 PM ?Medical Record Number: [id number] ?Patient Account Number: [id number] ?Date of Birth/Sex: Treating RN: ?[birth year] (88 y.o. F) Boehlein, [name] ?Primary Care Kaelani Kendrick: [name] Other Clinician: ?Referring Yeraldine Forney: ?Treating Shaylin Blatt/Extender: Fredirick Maudlin ?[name] ?Weeks in Treatment: 1 ?Visit Information History Since Last Visit ?Added or deleted any medications: No ?Patient Arrived: Wheel Chair ?Any new allergies or adverse reactions: No ?Arrival Time: 14:53 ?Had a fall or experienced change in No ?Accompanied By: family ?activities of daily living that may affect ?Transfer Assistance: Manual ?risk of falls: ?Patient Identification Verified: Yes ?Signs or symptoms of abuse/neglect since last visito No ?Secondary Verification Process Completed: Yes ?Hospitalized since last visit: No ?Patient Requires Transmission-Based Precautions: No ?Implantable device outside of the clinic excluding No ?Patient Has Alerts: No ?cellular tissue based products placed in the center ?since last visit: ?Has Dressing in Place as Prescribed: Yes ?Pain Present Now: Yes ?Electronic Signature(s) ?Signed: 01/01/2022 4:47:17 PM By: Baruch Gouty RN, BSN ?Entered By: Baruch Gouty on 01/01/2022 15:00:10 ?-------------------------------------------------------------------------------- ?Encounter Discharge Information Details ?Patient Name: Date of Service: ?[name], [name] 01/01/2022 2:45 PM ?Medical Record Number: [id number] ?Patient Account Number: [id number] ?Date of Birth/Sex: Treating RN: ?[birth year] (88 y.o. F) Boehlein, [name] ?Primary Care Charlane Westry: [name] Other Clinician: ?Referring Echo Propp: ?Treating Tyronica Truxillo/Extender: Fredirick Maudlin ?[name] ?Weeks in Treatment: 1 ?Encounter Discharge Information Items Post Procedure Vitals ?Discharge  Condition: Stable ?Temperature (F): 97.9 ?Ambulatory Status: Wheelchair ?Pulse (bpm): 91 ?Discharge Destination: Home ?Respiratory Rate (breaths/min): 18 ?Transportation: Private Auto ?Blood Pressure (mmHg): 131/72 ?Accompanied By: daughter ?Schedule Follow-up Appointment: Yes ?Clinical Summary of Care: Patient Declined ?Electronic Signature(s) ?Signed: 01/01/2022 4:47:17 PM By: Baruch Gouty RN, BSN ?Entered By: Baruch Gouty on 01/01/2022 15:36:17 ?-------------------------------------------------------------------------------- ?Lower Extremity Assessment Details ?Patient Name: ?Date of Service: ?[name], [name] 01/01/2022 2:45 PM ?Medical Record Number: [id number] ?Patient Account Number: [id number] ?Date of Birth/Sex: ?Treating RN: ?Jun 04, 1923 (88 y.o. F) Boehlein, [name] ?Primary Care Lonita Debes: [name] ?Other Clinician: ?Referring Audrie Kuri: ?Treating Nahlia Hellmann/Extender: Fredirick Maudlin ?[name] ?Weeks in Treatment: 1 ?Electronic Signature(s) ?Signed: 01/01/2022 4:47:17 PM By: Baruch Gouty RN, BSN ?Entered By: Baruch Gouty on 01/01/2022 15:01:28 ?-------------------------------------------------------------------------------- ?Multi Wound Chart Details ?Patient Name: ?Date of Service: ?[name], [name] 01/01/2022 2:45 PM ?Medical Record Number: [id number] ?Patient Account Number: [id number] ?Date of Birth/Sex: ?Treating RN: ?[birth year] (88 y.o. F) Boehlein, [name] ?Primary Care Darryle Dennie: [name] ?Other Clinician: ?Referring Izac Faulkenberry: ?Treating Trease Bremner/Extender: Fredirick Maudlin ?[name] ?Weeks in Treatment: 1 ?Vital Signs ?Height(in): 62 ?Pulse(bpm): 91 ?Weight(lbs): 110 ?Blood Pressure(mmHg): 131/72 ?Body Mass Index(BMI): 20.1 ?Temperature(??F): 97.9 ?Respiratory Rate(breaths/min): 18 ?Photos: [N/A:N/A] ?Thoracic spine N/A N/A ?Wound Location: ?Gradually Appeared N/A N/A ?Wounding Event: ?Pressure Ulcer N/A N/A ?Primary Etiology: ?Hypertension, Type II Diabetes N/A  N/A ?Comorbid History: ?12/19/2020 N/A N/A ?Date Acquired: ?1 N/A N/A ?Weeks of Treatment: ?Open N/A N/A ?Wound Status: ?No N/A N/A ?Wound Recurrence: ?2x1.4x1 N/A N/A ?Measurements L x W x D (cm) ?2.199 N/A N/A ?A (cm?) : ?rea ?2.199 N/A N/A ?Volume (cm?) : ?36.40% N/A N/A ?% Reduction in A rea: ?57.60% N/A N/A ?% Reduction in Volume: ?12 ?Starting Position 1 (o'clock): ?3 ?Ending Position 1 (o'clock): ?3.4 ?Maximum Distance 1 (cm): ?Yes N/A N/A ?Undermining: ?Category/Stage IV N/A N/A ?Classification: ?Medium N/A N/A ?Exudate A mount: ?Serosanguineous N/A N/A ?Exudate Type: ?red, brown N/A N/A ?Exudate Color: ?Well defined, not attached N/A N/A ?Wound Margin: ?Medium (34-66%) N/A N/A ?  Granulation Amount: ?Red N/A N/A ?Granulation Quality: ?Medium (34-66%) N/A N/A ?Necrotic Amount: ?Fat Layer (Subcutaneous Tissue): Yes N/A N/A ?Exposed Structures: ?Bone: Yes ?Fascia: No ?Tendon: No ?Muscle: No ?Joint: No ?None N/A N/A ?Epithelialization: ?Debridement - Excisional N/A N/A ?Debridement: ?Pre-procedure Verification/Time Out 15:15 N/A N/A ?Taken: ?Lidocaine 5% topical ointment N/A N/A ?Pain Control: ?Subcutaneous, Slough N/A N/A ?Tissue Debrided: ?Skin/Subcutaneous Tissue N/A N/A ?Level: ?2.8 N/A N/A ?Debridement A (sq cm): ?rea ?Curette N/A N/A ?Instrument: ?Minimum N/A N/A ?Bleeding: ?Pressure N/A N/A ?Hemostasis A chieved: ?2 N/A N/A ?Procedural Pain: ?1 N/A N/A ?Post Procedural Pain: ?Procedure was tolerated well N/A N/A ?Debridement Treatment Response: ?2x1.4x1 N/A N/A ?Post Debridement Measurements L x ?W x D (cm) ?2.199 N/A N/A ?Post Debridement Volume: (cm?) ?Category/Stage IV N/A N/A ?Post Debridement Stage: ?Debridement N/A N/A ?Procedures Performed: ?Treatment Notes ?Electronic Signature(s) ?Signed: 01/01/2022 3:26:44 PM By: Fredirick Maudlin MD FACS ?Signed: 01/01/2022 4:47:17 PM By: Baruch Gouty RN, BSN ?Entered By: Fredirick Maudlin on 01/01/2022  15:26:43 ?-------------------------------------------------------------------------------- ?Multi-Disciplinary Care Plan Details ?Patient Name: ?Date of Service: ?[name], [name] 01/01/2022 2:45 PM ?Medical Record Number: [id number] ?Patient Account Number: [id number] ?Date of Birth/Sex: ?Treating RN: ?[birth year] (88 y.o. F) Boehlein, [name] ?Primary Care Branch Pacitti: [name] ?Other Clinician: ?Referring Sehar Sedano: ?Treating Heber Hoog/Extender: Fredirick Maudlin ?[name] ?Weeks in Treatment: 1 ?Multidisciplinary Care Plan reviewed with physician ?Active Inactive ?Pressure ?Nursing Diagnoses: ?Knowledge deficit related to causes and risk factors for pressure ulcer development ?Knowledge deficit related to management of pressures ulcers ?Potential for impaired tissue integrity related to pressure, friction, moisture, and shear ?Goals: ?Patient will remain free of pressure ulcers ?Date Initiated: 12/25/2021 ?Target Resolution Date: 01/22/2022 ?Goal Status: Active ?Patient/caregiver will verbalize understanding of pressure ulcer management ?Date Initiated: 12/25/2021 ?Target Resolution Date: 01/22/2022 ?Goal Status: Active ?Interventions: ?Assess: immobility, friction, shearing, incontinence upon admission and as needed ?Assess offloading mechanisms upon admission and as needed ?Notes: ?Wound/Skin Impairment ?Nursing Diagnoses: ?Impaired tissue integrity ?Knowledge deficit related to ulceration/compromised skin integrity ?Goals: ?Patient will have a decrease in wound volume by X% from date: (specify in notes) ?Date Initiated: 12/20/2021 ?Target Resolution Date: 01/12/2022 ?Goal Status: Active ?Patient/caregiver will verbalize understanding of skin care regimen ?Date Initiated: 12/20/2021 ?Target Resolution Date: 01/12/2022 ?Goal Status: Active ?Ulcer/skin breakdown will have a volume reduction of 30% by week 4 ?Date Initiated: 12/20/2021 ?Target Resolution Date: 01/13/2022 ?Goal Status: Active ?Ulcer/skin breakdown will have  a volume reduction of 50% by week 8 ?Date Initiated: 12/20/2021 ?Target Resolution Date: 01/15/2022 ?Goal Status: Active ?Interventions: ?Assess patient/caregiver ability to obtain necessary supplies ?Assess patient/caregiver ability to perform ulcer/skin care regimen upon admission

## 2022-01-08 ENCOUNTER — Encounter (HOSPITAL_BASED_OUTPATIENT_CLINIC_OR_DEPARTMENT_OTHER): Payer: Medicare Other | Attending: General Surgery | Admitting: General Surgery

## 2022-01-08 DIAGNOSIS — S22089S Unspecified fracture of T11-T12 vertebra, sequela: Secondary | ICD-10-CM | POA: Insufficient documentation

## 2022-01-08 DIAGNOSIS — E11622 Type 2 diabetes mellitus with other skin ulcer: Secondary | ICD-10-CM | POA: Diagnosis not present

## 2022-01-08 DIAGNOSIS — L89104 Pressure ulcer of unspecified part of back, stage 4: Secondary | ICD-10-CM | POA: Insufficient documentation

## 2022-01-08 DIAGNOSIS — X58XXXS Exposure to other specified factors, sequela: Secondary | ICD-10-CM | POA: Diagnosis not present

## 2022-01-08 NOTE — Progress Notes (Signed)
Stacy, Perry ([id number]) ?Visit Report for 01/08/2022 ?Arrival Information Details ?Patient Name: Date of Service: ?[name], [name] 01/08/2022 2:00 PM ?Medical Record Number: [id number] ?Patient Account Number: [id number] ?Date of Birth/Sex: Treating RN: ?June 27, 1923 (88 y.o. F) Boehlein, [name] ?Primary Care Meisha Salone: [name] Other Clinician: ?Referring Chrles Selley: ?Treating Addalie Calles/Extender: Fredirick Maudlin ?[name] ?Weeks in Treatment: 2 ?Visit Information History Since Last Visit ?Added or deleted any medications: No ?Patient Arrived: Wheel Chair ?Any new allergies or adverse reactions: No ?Arrival Time: 14:04 ?Had a fall or experienced change in No ?Accompanied By: daughter ?activities of daily living that may affect ?Transfer Assistance: None ?risk of falls: ?Patient Identification Verified: Yes ?Signs or symptoms of abuse/neglect since last visito No ?Secondary Verification Process Completed: Yes ?Hospitalized since last visit: No ?Patient Requires Transmission-Based Precautions: No ?Implantable device outside of the clinic excluding No ?Patient Has Alerts: No ?cellular tissue based products placed in the center ?since last visit: ?Has Dressing in Place as Prescribed: Yes ?Pain Present Now: Yes ?Electronic Signature(s) ?Signed: 01/08/2022 5:48:07 PM By: Baruch Gouty RN, BSN ?Entered By: Baruch Gouty on 01/08/2022 14:04:48 ?-------------------------------------------------------------------------------- ?Encounter Discharge Information Details ?Patient Name: Date of Service: ?[name], Perry 01/08/2022 2:00 PM ?Medical Record Number: [id number] ?Patient Account Number: [id number] ?Date of Birth/Sex: Treating RN: ?August 14, 1923 (88 y.o. F) Boehlein, [name] ?Primary Care Kawhi Diebold: [name] Other Clinician: ?Referring Brigitt Mcclish: ?Treating Lorina Duffner/Extender: Fredirick Maudlin ?[name] ?Weeks in Treatment: 2 ?Encounter Discharge Information Items Post Procedure Vitals ?Discharge Condition:  Stable ?Temperature (F): 98.5 ?Ambulatory Status: Wheelchair ?Pulse (bpm): 85 ?Discharge Destination: Home ?Respiratory Rate (breaths/min): 18 ?Transportation: Private Auto ?Blood Pressure (mmHg): 144/78 ?Accompanied By: daughter ?Schedule Follow-up Appointment: Yes ?Clinical Summary of Care: Patient Declined ?Electronic Signature(s) ?Signed: 01/08/2022 5:48:07 PM By: Baruch Gouty RN, BSN ?Entered By: Baruch Gouty on 01/08/2022 14:45:44 ?-------------------------------------------------------------------------------- ?Lower Extremity Assessment Details ?Patient Name: ?Date of Service: ?Stacy HAELIE, [name] 01/08/2022 2:00 PM ?Medical Record Number: [id number] ?Patient Account Number: [id number] ?Date of Birth/Sex: ?Treating RN: ?10-22-1922 (88 y.o. F) Boehlein, [name] ?Primary Care Janetta Vandoren: [name] ?Other Clinician: ?Referring Sam Overbeck: ?Treating Carles Florea/Extender: Fredirick Maudlin ?[name] ?Weeks in Treatment: 2 ?Electronic Signature(s) ?Signed: 01/08/2022 5:48:07 PM By: Baruch Gouty RN, BSN ?Entered By: Baruch Gouty on 01/08/2022 14:06:21 ?-------------------------------------------------------------------------------- ?Multi Wound Chart Details ?Patient Name: ?Date of Service: ?Stacy AYVA, [name]. 01/08/2022 2:00 PM ?Medical Record Number: [id number] ?Patient Account Number: [id number] ?Date of Birth/Sex: ?Treating RN: ?1923-05-29 (88 y.o. F) Boehlein, [name] ?Primary Care Aaronjames Kelsay: [name] ?Other Clinician: ?Referring Kyrillos Adams: ?Treating Gerene Nedd/Extender: Fredirick Maudlin ?[name] ?Weeks in Treatment: 2 ?Vital Signs ?Height(in): 62 ?Pulse(bpm): 85 ?Weight(lbs): 110 ?Blood Pressure(mmHg): 144/78 ?Body Mass Index(BMI): 20.1 ?Temperature(??F): 98.5 ?Respiratory Rate(breaths/min): 18 ?Photos: [N/A:N/A] ?Thoracic spine N/A N/A ?Wound Location: ?Gradually Appeared N/A N/A ?Wounding Event: ?Pressure Ulcer N/A N/A ?Primary Etiology: ?Hypertension, Type II Diabetes N/A N/A ?Comorbid  History: ?12/19/2020 N/A N/A ?Date Acquired: ?2 N/A N/A ?Weeks of Treatment: ?Open N/A N/A ?Wound Status: ?No N/A N/A ?Wound Recurrence: ?2.3x1.6x1.1 N/A N/A ?Measurements L x W x D (cm) ?2.89 N/A N/A ?A (cm?) : ?rea ?3.179 N/A N/A ?Volume (cm?) : ?16.40% N/A N/A ?% Reduction in A rea: ?38.70% N/A N/A ?% Reduction in Volume: ?12 ?Starting Position 1 (o'clock): ?4 ?Ending Position 1 (o'clock): ?3.3 ?Maximum Distance 1 (cm): ?Yes N/A N/A ?Undermining: ?Category/Stage IV N/A N/A ?Classification: ?Medium N/A N/A ?Exudate A mount: ?Serosanguineous N/A N/A ?Exudate Type: ?red, brown N/A N/A ?Exudate Color: ?Well defined, not attached N/A N/A ?Wound Margin: ?Large (67-100%) N/A N/A ?  Granulation Amount: ?Red, Pink, Hyper-granulation N/A N/A ?Granulation Quality: ?Small (1-33%) N/A N/A ?Necrotic Amount: ?Fat Layer (Subcutaneous Tissue): Yes N/A N/A ?Exposed Structures: ?Bone: Yes ?Fascia: No ?Tendon: No ?Muscle: No ?Joint: No ?None N/A N/A ?Epithelialization: ?Debridement - Selective/Open Wound N/A N/A ?Debridement: ?Pre-procedure Verification/Time Out 14:25 N/A N/A ?Taken: ?Other N/A N/A ?Pain Control: ?Slough N/A N/A ?Tissue Debrided: ?Non-Viable Tissue N/A N/A ?Level: ?3.68 N/A N/A ?Debridement A (sq cm): ?rea ?Curette N/A N/A ?Instrument: ?Minimum N/A N/A ?Bleeding: ?Silver Nitrate N/A N/A ?Hemostasis A chieved: ?3 N/A N/A ?Procedural Pain: ?1 N/A N/A ?Post Procedural Pain: ?Procedure was tolerated well N/A N/A ?Debridement Treatment Response: ?2.3x1.6x1.1 N/A N/A ?Post Debridement Measurements L x ?W x D (cm) ?3.179 N/A N/A ?Post Debridement Volume: (cm?) ?Category/Stage IV N/A N/A ?Post Debridement Stage: ?Debridement N/A N/A ?Procedures Performed: ?Treatment Notes ?Wound #1 (Thoracic spine) ?Cleanser ?Normal Saline ?Discharge Instruction: Cleanse the wound with Normal Saline prior to applying a clean dressing using gauze sponges, not tissue or cotton balls. ?Soap and Water ?Discharge Instruction: May shower and wash  wound with dial antibacterial soap and water prior to dressing change. ?Wound Cleanser ?Discharge Instruction: Cleanse the wound with wound cleanser prior to applying a clean dressing using gauze sponges, not tissue or cotton balls. ?Peri-Wound Care ?Skin Prep ?Discharge Instruction: Use skin prep as directed ?Topical ?Primary Dressing ?Hydrofera Blue Classic Foam, 4x4 in ?Discharge Instruction: Moisten with saline prior to applying to wound bed. Be sure to tuck into undermining at 12 0clock ?Santyl Ointment ?Discharge Instruction: Apply nickel thick amount to wound bed as instructed ?Secondary Dressing ?Zetuvit Plus 4x8 in ?Discharge Instruction: Apply over primary dressing as directed. ?Secured With ?54M Medipore H Soft Cloth Surgical T ape, 4 x 10 (in/yd) ?Discharge Instruction: Secure with tape as directed. ?Compression Wrap ?Compression Stockings ?Add-Ons ?Electronic Signature(s) ?Signed: 01/08/2022 2:53:42 PM By: Fredirick Maudlin MD FACS ?Signed: 01/08/2022 5:48:07 PM By: Baruch Gouty RN, BSN ?Entered By: Fredirick Maudlin on 01/08/2022 14:53:41 ?-------------------------------------------------------------------------------- ?Multi-Disciplinary Care Plan Details ?Patient Name: ?Date of Service: ?Stacy PAMMIE, CHIRINO B. 01/08/2022 2:00 PM ?Medical Record Number: [id number] ?Patient Account Number: [id number] ?Date of Birth/Sex: ?Treating RN: ?1922-12-03 (88 y.o. F) Boehlein, [name] ?Primary Care Tierria Watson: [name] ?Other Clinician: ?Referring Chet Greenley: ?Treating Aeson Sawyers/Extender: Fredirick Maudlin ?[name] ?Weeks in Treatment: 2 ?Multidisciplinary Care Plan reviewed with physician ?Active Inactive ?Pressure ?Nursing Diagnoses: ?Knowledge deficit related to causes and risk factors for pressure ulcer development ?Knowledge deficit related to management of pressures ulcers ?Potential for impaired tissue integrity related to pressure, friction, moisture, and shear ?Goals: ?Patient will remain free of pressure  ulcers ?Date Initiated: 12/25/2021 ?Target Resolution Date: 01/22/2022 ?Goal Status: Active ?Patient/caregiver will verbalize understanding of pressure ulcer management ?Date Initiated: 12/25/2021 ?Target Resolution Date: 4/

## 2022-01-08 NOTE — Progress Notes (Signed)
ANAHLIA, ISEMINGER ([id number]) ?Visit Report for 01/08/2022 ?Chief Complaint Document Details ?Patient Name: Date of Service: ?[name], FORMBY 01/08/2022 2:00 PM ?Medical Record Number: [id number] ?Patient Account Number: [id number] ?Date of Birth/Sex: Treating RN: ?January 14, 1923 (88 y.o. F) Boehlein, [name] ?Primary Care Provider: [name] Other Clinician: ?Referring Provider: ?Treating Provider/Extender: Fredirick Maudlin ?[name] ?Weeks in Treatment: 2 ?Information Obtained from: Patient ?Chief Complaint ?Patient is at the clinic for treatment of an open pressure ulcer ?Electronic Signature(s) ?Signed: 01/08/2022 2:53:56 PM By: Fredirick Maudlin MD FACS ?Entered By: Fredirick Maudlin on 01/08/2022 14:53:56 ?-------------------------------------------------------------------------------- ?Debridement Details ?Patient Name: Date of Service: ?[name], HULICK 01/08/2022 2:00 PM ?Medical Record Number: [id number] ?Patient Account Number: [id number] ?Date of Birth/Sex: Treating RN: ?[birth year] (88 y.o. F) Boehlein, [name] ?Primary Care Provider: [name] Other Clinician: ?Referring Provider: ?Treating Provider/Extender: Fredirick Maudlin ?[name] ?Weeks in Treatment: 2 ?Debridement Performed for Assessment: Wound #1 Thoracic spine ?Performed By: Physician Fredirick Maudlin, MD ?Debridement Type: Debridement ?Level of Consciousness (Pre-procedure): Awake and Alert ?Pre-procedure Verification/Time Out Yes - 14:25 ?Taken: ?Start Time: 14:25 ?Pain Control: ?Other : benzocaine 20% spray ?T Area Debrided (L x W): ?otal 2.3 (cm) x 1.6 (cm) = 3.68 (cm?) ?Tissue and other material debrided: Non-Viable, [place], [place] ?Level: Non-Viable Tissue ?Debridement Description: Selective/Open Wound ?Instrument: Curette ?Bleeding: Minimum ?Hemostasis Achieved: Silver Nitrate ?Procedural Pain: 3 ?Post Procedural Pain: 1 ?Response to Treatment: Procedure was tolerated well ?Level of Consciousness (Post- Awake and  Alert ?procedure): ?Post Debridement Measurements of Total Wound ?Length: (cm) 2.3 ?Stage: Category/Stage IV ?Width: (cm) 1.6 ?Depth: (cm) 1.1 ?Volume: (cm?) 3.179 ?Character of Wound/Ulcer Post Debridement: Improved ?Post Procedure Diagnosis ?Same as Pre-procedure ?Electronic Signature(s) ?Signed: 01/08/2022 3:25:11 PM By: Fredirick Maudlin MD FACS ?Signed: 01/08/2022 5:48:07 PM By: Baruch Gouty RN, BSN ?Entered By: Baruch Gouty on 01/08/2022 14:30:48 ?-------------------------------------------------------------------------------- ?HPI Details ?Patient Name: Date of Service: ?[name], CHIVERS 01/08/2022 2:00 PM ?Medical Record Number: [id number] ?Patient Account Number: [id number] ?Date of Birth/Sex: Treating RN: ?[birth year] (88 y.o. F) Boehlein, [name] ?Primary Care Provider: [name] Other Clinician: ?Referring Provider: ?Treating Provider/Extender: Fredirick Maudlin ?[name] ?Weeks in Treatment: 2 ?History of Present Illness ?HPI Description: ADMISSION ?12/19/21 ?This is a 88 year old woman who is presenting to clinic today with a an open ulcer over her thoracic spine. She has severe kyphosis and a history of prior T12 ?fracture, as well as type 2 diabetes mellitus. The wound has been present for about a year. She is accompanied by her daughters, 1 of whom is a Marine scientist. They ?have been trying to offload the site by using pillows and supports to keep returned while in bed, and an eggcrate foam with a cut out for the wound for when ?she is sitting up. They have been applying Santyl to the site. She does have home health and the provider took a culture which was positive for Pseudomonas. ?She was on ciprofloxacin for this. A recent repeat culture was negative. Most recent hemoglobin A1c was 6.5, in February. A C-reactive protein also obtained in ?February was elevated at 24.1. No imaging has been performed of the site. The patient denies significant pain. ?12/25/2021: Last week, I took a bone biopsy as well  as culture. Fortunately, the bone biopsy was negative for osteomyelitis. The culture returned with rare ?corynebacterium and rare Enterococcus faecalis. We have been using Santyl and Hydrofera Blue. T oday, the wound appears cleaner and there are buds of ?granulation tissue forming around the perimeter. ?01/01/2022: Due to the positive culture, we  switched to topical gentamicin. Today, she has had a little bit of a slough accumulation. The wound continues to have ?buds of granulation tissue forming. Apparently, the Hydrofera Blue was placed just over the wound opening rather than down in to contact the surface. No ?purulent drainage or odor. ?01/08/2022: Last week, there was more slough in the wound bed, so I changed back to Santyl. There is now hypertrophic granulation tissue around the wound ?opening. Once again, we seem to be having some difficulty getting the Hydrofera Blue tucked up into the wound; there is undermining that extends for 3 to 4 ?cm at the 12 o'clock position and the Hydrofera Blue is simply sitting on top of the wound without being in contact with the surface. Bone is still palpable, but ?tissue is beginning to close in over it. ?Electronic Signature(s) ?Signed: 01/08/2022 2:55:22 PM By: Fredirick Maudlin MD FACS ?Entered By: Fredirick Maudlin on 01/08/2022 14:55:22 ?-------------------------------------------------------------------------------- ?Physical Exam Details ?Patient Name: Date of Service: ?[name], LAPAGLIA 01/08/2022 2:00 PM ?Medical Record Number: [id number] ?Patient Account Number: [id number] ?Date of Birth/Sex: Treating RN: ?1923/03/15 (88 y.o. F) Boehlein, [name] ?Primary Care Provider: [name] Other Clinician: ?Referring Provider: ?Treating Provider/Extender: Fredirick Maudlin ?[name] ?Weeks in Treatment: 2 ?Constitutional ?. . . . No acute distress. ?Respiratory ?Normal work of breathing on room air. ?Notes ?01/08/2022: The slough buildup has been minimal over the last week.  She has hypertrophic granulation tissue surrounding the orifice of the wound. The bone is ?still palpable in the undermined area, but there is tissue beginning to close over it. ?Electronic Signature(s) ?Signed: 01/08/2022 2:56:30 PM By: Fredirick Maudlin MD FACS ?Entered By: Fredirick Maudlin on 01/08/2022 14:56:29 ?-------------------------------------------------------------------------------- ?Physician Orders Details ?Patient Name: ?Date of Service: ?HA LYNDEE, HERBST B. 01/08/2022 2:00 PM ?Medical Record Number: [id number] ?Patient Account Number: [id number] ?Date of Birth/Sex: ?Treating RN: ?10-Jan-1923 (88 y.o. F) Boehlein, [name] ?Primary Care Provider: [name] ?Other Clinician: ?Referring Provider: ?Treating Provider/Extender: Fredirick Maudlin ?[name] ?Weeks in Treatment: 2 ?Verbal / Phone Orders: No ?Diagnosis Coding ?ICD-10 Coding ?Code Description ?L89.104 Pressure ulcer of unspecified part of back, stage 4 ?E11.622 Type 2 diabetes mellitus with other skin ulcer ?S22.089S Unspecified fracture of T11-T12 vertebra, sequela ?Follow-up Appointments ?ppointment in 1 week. - Dr. Celine Ahr with [facility] ?Return A ?Bathing/ Shower/ Hygiene ?May shower with protection but do not get wound dressing(s) wet. ?Off-Loading ?Turn and reposition every 2 hours ?Other: - KEEP PRESSURE OFF OF BACK ?MAY USE EGG CRATE CUSHION ?Home Health ?No change in wound care orders this week; continue Home Health for wound care. May utilize formulary equivalent dressing for wound ?treatment orders unless otherwise specified. ?Other Home Health Orders/Instructions: [facility] ?Wound Treatment ?Wound #1 - Thoracic spine ?Cleanser: Normal Saline (Generic) 1 x Per Day/30 Days ?Discharge Instructions: Cleanse the wound with Normal Saline prior to applying a clean dressing using gauze sponges, not tissue or cotton balls. ?Cleanser: Soap and Water 1 x Per Day/30 Days ?Discharge Instructions: May shower and wash wound with dial antibacterial soap  and water prior to dressing change. ?Cleanser: Wound Cleanser (Generic) 1 x Per Day/30 Days ?Discharge Instructions: Cleanse the wound with wound cleanser prior to applying a clean dressing using gauze sponges, not tissue

## 2022-01-15 ENCOUNTER — Encounter (HOSPITAL_BASED_OUTPATIENT_CLINIC_OR_DEPARTMENT_OTHER): Payer: Medicare Other | Admitting: General Surgery

## 2022-01-22 ENCOUNTER — Encounter (HOSPITAL_BASED_OUTPATIENT_CLINIC_OR_DEPARTMENT_OTHER): Payer: Medicare Other | Admitting: General Surgery

## 2022-01-22 DIAGNOSIS — L89104 Pressure ulcer of unspecified part of back, stage 4: Secondary | ICD-10-CM | POA: Diagnosis not present

## 2022-01-22 NOTE — Progress Notes (Signed)
Stacy, Perry ([id number]) ?Visit Report for 01/22/2022 ?Chief Complaint Document Details ?Patient Name: Date of Service: ?Stacy, Perry 01/22/2022 2:00 PM ?Medical Record Number: [id number] ?Patient Account Number: [id number] ?Date of Birth/Sex: Treating RN: ?02-08-1923 (88 y.o. F) ?Primary Care Provider: [name] Other Clinician: ?Referring Provider: ?Treating Provider/Extender: Fredirick Maudlin ?[name] ?Weeks in Treatment: 4 ?Information Obtained from: Patient ?Chief Complaint ?Patient is at the clinic for treatment of an open pressure ulcer ?Electronic Signature(s) ?Signed: 01/22/2022 3:12:33 PM By: Fredirick Maudlin MD FACS ?Entered By: Fredirick Maudlin on 01/22/2022 15:12:32 ?-------------------------------------------------------------------------------- ?Debridement Details ?Patient Name: Date of Service: ?Stacy, Perry 01/22/2022 2:00 PM ?Medical Record Number: [id number] ?Patient Account Number: [id number] ?Date of Birth/Sex: Treating RN: ?[birth year] (88 y.o. F) ?Primary Care Provider: [name] Other Clinician: ?Referring Provider: ?Treating Provider/Extender: Fredirick Maudlin ?[name] ?Weeks in Treatment: 4 ?Debridement Performed for Assessment: Wound #1 Thoracic spine ?Performed By: Physician Fredirick Maudlin, MD ?Debridement Type: Debridement ?Level of Consciousness (Pre-procedure): Awake and Alert ?Pre-procedure Verification/Time Out Yes - 14:48 ?Taken: ?Start Time: 14:48 ?Pain Control: ?Other : Benzocaine 20% ?T Area Debrided (L x W): ?otal 0.5 (cm) x 0.5 (cm) = 0.25 (cm?) ?Tissue and other material debrided: Viable, Non-Viable, Slough, Subcutaneous, Slough ?Level: Skin/Subcutaneous Tissue ?Debridement Description: Excisional ?Instrument: Curette ?Bleeding: Minimum ?Hemostasis Achieved: Pressure ?Procedural Pain: 2 ?Post Procedural Pain: 0 ?Response to Treatment: Procedure was tolerated well ?Level of Consciousness (Post- Awake and Alert ?procedure): ?Post Debridement  Measurements of Total Wound ?Length: (cm) 2.2 ?Stage: Category/Stage IV ?Width: (cm) 1.5 ?Depth: (cm) 0.8 ?Volume: (cm?) 2.073 ?Character of Wound/Ulcer Post Debridement: Improved ?Post Procedure Diagnosis ?Same as Pre-procedure ?Electronic Signature(s) ?Signed: 01/22/2022 3:26:03 PM By: Fredirick Maudlin MD FACS ?Signed: 01/22/2022 5:00:38 PM By: Adline Peals ?Entered By: Adline Peals on 01/22/2022 14:51:22 ?-------------------------------------------------------------------------------- ?HPI Details ?Patient Name: Date of Service: ?Stacy, Perry 01/22/2022 2:00 PM ?Medical Record Number: [id number] ?Patient Account Number: [id number] ?Date of Birth/Sex: Treating RN: ?July 19, 1923 (88 y.o. F) ?Primary Care Provider: [name] Other Clinician: ?Referring Provider: ?Treating Provider/Extender: Fredirick Maudlin ?[name] ?Weeks in Treatment: 4 ?History of Present Illness ?HPI Description: ADMISSION ?12/19/21 ?This is a 88 year old woman who is presenting to clinic today with a an open ulcer over her thoracic spine. She has severe kyphosis and a history of prior T12 ?fracture, as well as type 2 diabetes mellitus. The wound has been present for about a year. She is accompanied by her daughters, 1 of whom is a Marine scientist. They ?have been trying to offload the site by using pillows and supports to keep returned while in bed, and an eggcrate foam with a cut out for the wound for when ?she is sitting up. They have been applying Santyl to the site. She does have home health and the provider took a culture which was positive for Pseudomonas. ?She was on ciprofloxacin for this. A recent repeat culture was negative. Most recent hemoglobin A1c was 6.5, in February. A C-reactive protein also obtained in ?February was elevated at 24.1. No imaging has been performed of the site. The patient denies significant pain. ?12/25/2021: Last week, I took a bone biopsy as well as culture. Fortunately, the bone biopsy was  negative for osteomyelitis. The culture returned with rare ?corynebacterium and rare Enterococcus faecalis. We have been using Santyl and Hydrofera Blue. T oday, the wound appears cleaner and there are buds of ?granulation tissue forming around the perimeter. ?01/01/2022: Due to the positive culture, we switched to topical gentamicin. Today, she has had a  little bit of a slough accumulation. The wound continues to have ?buds of granulation tissue forming. Apparently, the Hydrofera Blue was placed just over the wound opening rather than down in to contact the surface. No ?purulent drainage or odor. ?01/08/2022: Last week, there was more slough in the wound bed, so I changed back to Santyl. There is now hypertrophic granulation tissue around the wound ?opening. Once again, we seem to be having some difficulty getting the Hydrofera Blue tucked up into the wound; there is undermining that extends for 3 to 4 ?cm at the 12 o'clock position and the Hydrofera Blue is simply sitting on top of the wound without being in contact with the surface. Bone is still palpable, but ?tissue is beginning to close in over it. ?01/22/2022: The wound looks much cleaner this week and the hypertrophic granulation tissue responded appropriately to silver nitrate application. She continues ?to have undermining for about 4 cm from 12:00 to 4:00. I can still feel the bone in the base at about the 2 o'clock position, but it is less prominent and tissue is ?closing over. We have been using Santyl and Hydrofera Blue. The daughter that accompanies the patient today indicates that they have been very diligent in ?making sure the [facility] is tucked under and into the full base of the wound. ?Electronic Signature(s) ?Signed: 01/22/2022 3:14:12 PM By: Fredirick Maudlin MD FACS ?Entered By: Fredirick Maudlin on 01/22/2022 15:14:12 ?-------------------------------------------------------------------------------- ?Physical Exam Details ?Patient Name: Date  of Service: ?Stacy, Perry 01/22/2022 2:00 PM ?Medical Record Number: [id number] ?Patient Account Number: [id number] ?Date of Birth/Sex: Treating RN: ?06-20-1923 (88 y.o. F) ?Primary Care Provider: [name] Other Clinician: ?Referring Provider: ?Treating Provider/Extender: Fredirick Maudlin ?[name] ?Weeks in Treatment: 4 ?Constitutional ?. . . . No acute distress. ?Respiratory ?Normal work of breathing on room air. ?Notes ?01/22/2022: There is very little slough buildup in the wound. No hypertrophic granulation tissue appreciated. The bone remains palpable in the undermined area, ?but tissue continues to close over it. The undermining extends from 12 to about 4:00. ?Electronic Signature(s) ?Signed: 01/22/2022 3:15:21 PM By: Fredirick Maudlin MD FACS ?Entered By: Fredirick Maudlin on 01/22/2022 15:15:21 ?-------------------------------------------------------------------------------- ?Physician Orders Details ?Patient Name: Date of Service: ?Stacy, Perry 01/22/2022 2:00 PM ?Medical Record Number: [id number] ?Patient Account Number: [id number] ?Date of Birth/Sex: Treating RN: ?12/23/1922 (88 y.o. F) ?Primary Care Provider: [name] Other Clinician: ?Referring Provider: ?Treating Provider/Extender: Fredirick Maudlin ?[name] ?Weeks in Treatment: 4 ?Verbal / Phone Orders: No ?Diagnosis Coding ?ICD-10 Coding ?Code Description ?L89.104 Pressure ulcer of unspecified part of back, stage 4 ?E11.622 Type 2 diabetes mellitus with other skin ulcer ?S22.089S Unspecified fracture of T11-T12 vertebra, sequela ?Follow-up Appointments ?ppointment in 2 weeks. - Dr. Celine Ahr with [facility] ?Return A ?Bathing/ Shower/ Hygiene ?May shower with protection but do not get wound dressing(s) wet. ?Off-Loading ?Turn and reposition every 2 hours ?Other: - KEEP PRESSURE OFF OF BACK ?MAY USE EGG CRATE CUSHION ?Home Health ?No change in wound care orders this week; continue Home Health for wound care. May utilize formulary  equivalent dressing for wound ?treatment orders unless otherwise specified. ?Other Home Health Orders/Instructions: [facility] ?Wound Treatment ?Wound #1 - Thoracic spine ?Cleanser: Normal Saline (Generic) 1 x Per Da

## 2022-01-24 NOTE — Progress Notes (Signed)
SHAILYN, WEYANDT ([id number]) ?Visit Report for 01/22/2022 ?Arrival Information Details ?Patient Name: Date of Service: ?[name], [name] 01/22/2022 2:00 PM ?Medical Record Number: [id number] ?Patient Account Number: [id number] ?Date of Birth/Sex: Treating RN: ?[birth year] (88 y.o. F) ?Primary Care Taiana Temkin: [name] Other Clinician: ?Referring Rece Zechman: ?Treating Dalynn Jhaveri/Extender: Fredirick Maudlin ?[name] ?Weeks in Treatment: 4 ?Visit Information History Since Last Visit ?Added or deleted any medications: No ?Patient Arrived: Wheel Chair ?Any new allergies or adverse reactions: No ?Arrival Time: 14:19 ?Had a fall or experienced change in No ?Accompanied By: daughter ?activities of daily living that may affect ?Transfer Assistance: Manual ?risk of falls: ?Patient Identification Verified: Yes ?Signs or symptoms of abuse/neglect since last visito No ?Secondary Verification Process Completed: Yes ?Hospitalized since last visit: No ?Patient Requires Transmission-Based Precautions: No ?Implantable device outside of the clinic excluding No ?Patient Has Alerts: No ?cellular tissue based products placed in the center ?since last visit: ?Has Dressing in Place as Prescribed: Yes ?Pain Present Now: Yes ?Electronic Signature(s) ?Signed: 01/24/2022 3:16:57 PM By: Sandre Kitty ?Entered By: Sandre Kitty on 01/22/2022 14:20:28 ?-------------------------------------------------------------------------------- ?Encounter Discharge Information Details ?Patient Name: Date of Service: ?[name], [name] 01/22/2022 2:00 PM ?Medical Record Number: [id number] ?Patient Account Number: [id number] ?Date of Birth/Sex: Treating RN: ?1922/11/22 (88 y.o. F) ?Primary Care Fathima Bartl: [name] Other Clinician: ?Referring Ismaeel Arvelo: ?Treating Kelty Szafran/Extender: Fredirick Maudlin ?[name] ?Weeks in Treatment: 4 ?Encounter Discharge Information Items Post Procedure Vitals ?Discharge Condition: Stable ?Temperature (F):  98.8 ?Ambulatory Status: Wheelchair ?Pulse (bpm): 89 ?Discharge Destination: Home ?Respiratory Rate (breaths/min): 18 ?Transportation: Private Auto ?Blood Pressure (mmHg): 144/75 ?Accompanied By: daughter ?Schedule Follow-up Appointment: Yes ?Clinical Summary of Care: Patient Declined ?Electronic Signature(s) ?Signed: 01/22/2022 5:00:38 PM By: Adline Peals ?Entered By: Adline Peals on 01/22/2022 15:07:10 ?-------------------------------------------------------------------------------- ?Lower Extremity Assessment Details ?Patient Name: ?Date of Service: ?[name], [name] 01/22/2022 2:00 PM ?Medical Record Number: [id number] ?Patient Account Number: [id number] ?Date of Birth/Sex: ?Treating RN: ?[birth year] (88 y.o. F) ?Primary Care Armie Moren: [name] ?Other Clinician: ?Referring Belladonna Lubinski: ?Treating Deairra Halleck/Extender: Fredirick Maudlin ?[name] ?Weeks in Treatment: 4 ?Electronic Signature(s) ?Signed: 01/22/2022 5:00:38 PM By: Adline Peals ?Entered By: Adline Peals on 01/22/2022 14:28:30 ?-------------------------------------------------------------------------------- ?Multi Wound Chart Details ?Patient Name: ?Date of Service: ?[name], [name] 01/22/2022 2:00 PM ?Medical Record Number: [id number] ?Patient Account Number: [id number] ?Date of Birth/Sex: ?Treating RN: ?1923-03-18 (88 y.o. F) ?Primary Care Rether Rison: [name] ?Other Clinician: ?Referring Daquana Paddock: ?Treating Deziray Nabi/Extender: Fredirick Maudlin ?[name] ?Weeks in Treatment: 4 ?Vital Signs ?Height(in): 62 ?Pulse(bpm): 89 ?Weight(lbs): 110 ?Blood Pressure(mmHg): 144/75 ?Body Mass Index(BMI): 20.1 ?Temperature(??F): 98.8 ?Respiratory Rate(breaths/min): 18 ?Photos: [1:No Photos Thoracic spine] [N/A:N/A N/A] ?Wound Location: [1:Gradually Appeared] [N/A:N/A] ?Wounding Event: [1:Pressure Ulcer] [N/A:N/A] ?Primary Etiology: [1:Hypertension, Type II Diabetes] [N/A:N/A] ?Comorbid History: [1:12/19/2020] [N/A:N/A] ?Date  Acquired: [1:4] [N/A:N/A] ?Weeks of Treatment: [1:Open] [N/A:N/A] ?Wound Status: [1:No] [N/A:N/A] ?Wound Recurrence: [1:2.2x1.5x0.8] [N/A:N/A] ?Measurements L x W x D (cm) [1:2.592] [N/A:N/A] ?A (cm?) : ?rea [1:2.073] [N/A:N/A] ?Volume (cm?) : [1:25.00%] [N/A:N/A] ?% Reduction in A rea: [1:60.00%] [N/A:N/A] ?% Reduction in Volume: [1:11] ?Starting Position 1 (o'clock): [1:4] ?Ending Position 1 (o'clock): [1:2.2] ?Maximum Distance 1 (cm): [1:Yes] [N/A:N/A] ?Undermining: [1:Category/Stage IV] [N/A:N/A] ?Classification: [1:Medium] [N/A:N/A] ?Exudate A mount: [1:Serosanguineous] [N/A:N/A] ?Exudate Type: [1:red, brown] [N/A:N/A] ?Exudate Color: [1:Well defined, not attached] [N/A:N/A] ?Wound Margin: [1:Large (67-100%)] [N/A:N/A] ?Granulation A mount: [1:Red, Pink] [N/A:N/A] ?Granulation Quality: [1:Small (1-33%)] [N/A:N/A] ?Necrotic A mount: ?[1:Fat Layer (Subcutaneous Tissue): Yes N/A] ?Exposed Structures: ?[1:Bone: Yes Fascia: No Tendon: No Muscle: No Joint: No  None] [N/A:N/A] ?Epithelialization: [1:Debridement - Excisional] [N/A:N/A] ?Debridement: [1:14:48] [N/A:N/A] ?Pre-procedure Verification/Time Out ?Taken: [1:Other] [N/A:N/A] ?Pain Control: [1:Subcutaneous, Slough] [N/A:N/A] ?Tissue Debrided: [1:Skin/Subcutaneous Tissue] [N/A:N/A] ?Level: [1:0.25] [N/A:N/A] ?Debridement A (sq cm): [1:rea Curette] [N/A:N/A] ?Instrument: [1:Minimum] [N/A:N/A] ?Bleeding: [1:Pressure] [N/A:N/A] ?Hemostasis A chieved: [1:2] [N/A:N/A] ?Procedural Pain: [1:0] [N/A:N/A] ?Post Procedural Pain: [1:Procedure was tolerated well] [N/A:N/A] ?Debridement Treatment Response: [1:2.2x1.5x0.8] [N/A:N/A] ?Post Debridement Measurements L x ?W x D (cm) [1:2.073] [N/A:N/A] ?Post Debridement Volume: (cm?) [1:Category/Stage IV] [N/A:N/A] ?Post Debridement Stage: [1:Debridement] [N/A:N/A] ?Treatment Notes ?Wound #1 (Thoracic spine) ?Cleanser ?Normal Saline ?Discharge Instruction: Cleanse the wound with Normal Saline prior to applying a clean dressing  using gauze sponges, not tissue or cotton balls. ?Soap and Water ?Discharge Instruction: May shower and wash wound with dial antibacterial soap and water prior to dressing change. ?Wound Cleanser ?Discharge Instruction: Cleanse the wound with wound cleanser prior to applying a clean dressing using gauze sponges, not tissue or cotton balls. ?Peri-Wound Care ?Skin Prep ?Discharge Instruction: Use skin prep as directed ?Topical ?Primary Dressing ?Hydrofera Blue Classic Foam, 4x4 in ?Discharge Instruction: Moisten with saline prior to applying to wound bed. Be sure to tuck into undermining at 12 0clock ?Santyl Ointment ?Discharge Instruction: Apply nickel thick amount to wound bed as instructed ?Secondary Dressing ?Zetuvit Plus 4x8 in ?Discharge Instruction: Apply over primary dressing as directed. ?Secured With ?59M Medipore H Soft Cloth Surgical T ape, 4 x 10 (in/yd) ?Discharge Instruction: Secure with tape as directed. ?Compression Wrap ?Compression Stockings ?Add-Ons ?Electronic Signature(s) ?Signed: 01/22/2022 3:12:23 PM By: Fredirick Maudlin MD FACS ?Entered By: Fredirick Maudlin on 01/22/2022 15:12:22 ?-------------------------------------------------------------------------------- ?Multi-Disciplinary Care Plan Details ?Patient Name: ?Date of Service: ?[name], [name] 01/22/2022 2:00 PM ?Medical Record Number: [id number] ?Patient Account Number: [id number] ?Date of Birth/Sex: ?Treating RN: ?Dec 04, 1922 (88 y.o. F) ?Primary Care Julliette Frentz: [name] ?Other Clinician: ?Referring Kaceton Vieau: ?Treating Cornelious Diven/Extender: Fredirick Maudlin ?[name] ?Weeks in Treatment: 4 ?Multidisciplinary Care Plan reviewed with physician ?Active Inactive ?Pressure ?Nursing Diagnoses: ?Knowledge deficit related to causes and risk factors for pressure ulcer development ?Knowledge deficit related to management of pressures ulcers ?Potential for impaired tissue integrity related to pressure, friction, moisture, and  shear ?Goals: ?Patient will remain free of pressure ulcers ?Date Initiated: 12/25/2021 ?Target Resolution Date: 02/19/2022 ?Goal Status: Active ?Patient/caregiver will verbalize understanding of pressure ulcer management ?Date Initiated: 3/

## 2022-02-05 ENCOUNTER — Encounter (HOSPITAL_BASED_OUTPATIENT_CLINIC_OR_DEPARTMENT_OTHER): Payer: Medicare Other | Attending: General Surgery | Admitting: General Surgery

## 2022-02-05 DIAGNOSIS — L89104 Pressure ulcer of unspecified part of back, stage 4: Secondary | ICD-10-CM | POA: Diagnosis not present

## 2022-02-05 DIAGNOSIS — S22089S Unspecified fracture of T11-T12 vertebra, sequela: Secondary | ICD-10-CM | POA: Insufficient documentation

## 2022-02-05 DIAGNOSIS — M40204 Unspecified kyphosis, thoracic region: Secondary | ICD-10-CM | POA: Insufficient documentation

## 2022-02-05 DIAGNOSIS — E11622 Type 2 diabetes mellitus with other skin ulcer: Secondary | ICD-10-CM | POA: Insufficient documentation

## 2022-02-05 DIAGNOSIS — X58XXXS Exposure to other specified factors, sequela: Secondary | ICD-10-CM | POA: Diagnosis not present

## 2022-02-05 NOTE — Progress Notes (Signed)
[name], [name] ([id number]) ?Visit Report for 02/05/2022 ?Chief Complaint Document Details ?Patient Name: Date of Service: ?[name], [name] 02/05/2022 2:00 PM ?Medical Record Number: [id number] ?Patient Account Number: [id number] ?Date of Birth/Sex: Treating RN: ?1923-05-24 (88 y.o. F) ?Primary Care Provider: [name] Other Clinician: ?Referring Provider: ?Treating Provider/Extender: Fredirick Maudlin ?[name] ?Weeks in Treatment: 6 ?Information Obtained from: Patient ?Chief Complaint ?Patient is at the clinic for treatment of an open pressure ulcer ?Electronic Signature(s) ?Signed: 02/05/2022 3:04:17 PM By: Fredirick Maudlin MD FACS ?Entered By: Fredirick Maudlin on 02/05/2022 15:04:17 ?-------------------------------------------------------------------------------- ?Debridement Details ?Patient Name: Date of Service: ?[name], [name] 02/05/2022 2:00 PM ?Medical Record Number: [id number] ?Patient Account Number: [id number] ?Date of Birth/Sex: Treating RN: ?11/23/1922 (88 y.o. Harlow Ohms ?Primary Care Provider: [name] Other Clinician: ?Referring Provider: ?Treating Provider/Extender: Fredirick Maudlin ?[name] ?Weeks in Treatment: 6 ?Debridement Performed for Assessment: Wound #1 Thoracic spine ?Performed By: Physician Fredirick Maudlin, MD ?Debridement Type: Debridement ?Level of Consciousness (Pre-procedure): Awake and Alert ?Pre-procedure Verification/Time Out Yes - 14:28 ?Taken: ?Start Time: 14:28 ?Pain Control: ?Other : Benzocaine 20% ?T Area Debrided (L x W): ?otal 1.9 (cm) x 1.2 (cm) = 2.28 (cm?) ?Tissue and other material debrided: Viable, Non-Viable, Slough, Subcutaneous, Slough ?Level: Skin/Subcutaneous Tissue ?Debridement Description: Excisional ?Instrument: Curette ?Bleeding: Minimum ?Hemostasis Achieved: Pressure ?Procedural Pain: 0 ?Post Procedural Pain: 0 ?Response to Treatment: Procedure was tolerated well ?Level of Consciousness (Post- Awake and Alert ?procedure): ?Post  Debridement Measurements of Total Wound ?Length: (cm) 1.9 ?Stage: Category/Stage IV ?Width: (cm) 1.2 ?Depth: (cm) 0.7 ?Volume: (cm?) 1.253 ?Character of Wound/Ulcer Post Debridement: Improved ?Post Procedure Diagnosis ?Same as Pre-procedure ?Electronic Signature(s) ?Signed: 02/05/2022 4:45:35 PM By: Fredirick Maudlin MD FACS ?Signed: 02/05/2022 5:16:31 PM By: Adline Peals ?Entered By: Adline Peals on 02/05/2022 14:30:09 ?-------------------------------------------------------------------------------- ?HPI Details ?Patient Name: Date of Service: ?[name], [name] 02/05/2022 2:00 PM ?Medical Record Number: [id number] ?Patient Account Number: [id number] ?Date of Birth/Sex: Treating RN: ?1923/08/25 (88 y.o. F) ?Primary Care Provider: [name] Other Clinician: ?Referring Provider: ?Treating Provider/Extender: Fredirick Maudlin ?[name] ?Weeks in Treatment: 6 ?History of Present Illness ?HPI Description: ADMISSION ?12/19/21 ?This is a 88 year old woman who is presenting to clinic today with a an open ulcer over her thoracic spine. She has severe kyphosis and a history of prior T12 ?fracture, as well as type 2 diabetes mellitus. The wound has been present for about a year. She is accompanied by her daughters, 1 of whom is a Marine scientist. They ?have been trying to offload the site by using pillows and supports to keep returned while in bed, and an eggcrate foam with a cut out for the wound for when ?she is sitting up. They have been applying Santyl to the site. She does have home health and the provider took a culture which was positive for Pseudomonas. ?She was on ciprofloxacin for this. A recent repeat culture was negative. Most recent hemoglobin A1c was 6.5, in February. A C-reactive protein also obtained in ?February was elevated at 24.1. No imaging has been performed of the site. The patient denies significant pain. ?12/25/2021: Last week, I took a bone biopsy as well as culture. Fortunately, the bone biopsy  was negative for osteomyelitis. The culture returned with rare ?corynebacterium and rare Enterococcus faecalis. We have been using Santyl and Hydrofera Blue. T oday, the wound appears cleaner and there are buds of ?granulation tissue forming around the perimeter. ?01/01/2022: Due to the positive culture, we switched to topical gentamicin. Today, she has  had a little bit of a slough accumulation. The wound continues to have ?buds of granulation tissue forming. Apparently, the Hydrofera Blue was placed just over the wound opening rather than down in to contact the surface. No ?purulent drainage or odor. ?01/08/2022: Last week, there was more slough in the wound bed, so I changed back to Santyl. There is now hypertrophic granulation tissue around the wound ?opening. Once again, we seem to be having some difficulty getting the Hydrofera Blue tucked up into the wound; there is undermining that extends for 3 to 4 ?cm at the 12 o'clock position and the Hydrofera Blue is simply sitting on top of the wound without being in contact with the surface. Bone is still palpable, but ?tissue is beginning to close in over it. ?01/22/2022: The wound looks much cleaner this week and the hypertrophic granulation tissue responded appropriately to silver nitrate application. She continues ?to have undermining for about 4 cm from 12:00 to 4:00. I can still feel the bone in the base at about the 2 o'clock position, but it is less prominent and tissue is ?closing over. We have been using Santyl and Hydrofera Blue. The daughter that accompanies the patient today indicates that they have been very diligent in ?making sure the [facility] is tucked under and into the full base of the wound. ?02/05/2022: The wound overall is improved. The undermining is about the same but there is less bone palpable. We have been using Santyl and Hydrofera Blue. ?Minimal slough accumulation in the 2 weeks since her last visit. ?Electronic Signature(s) ?Signed:  02/05/2022 3:05:30 PM By: Fredirick Maudlin MD FACS ?Entered By: Fredirick Maudlin on 02/05/2022 15:05:29 ?-------------------------------------------------------------------------------- ?Physical Exam Details ?Patient Name: Date of Service: ?[name], [name] 02/05/2022 2:00 PM ?Medical Record Number: [id number] ?Patient Account Number: [id number] ?Date of Birth/Sex: Treating RN: ?02-18-1923 (88 y.o. F) ?Primary Care Provider: [name] Other Clinician: ?Referring Provider: ?Treating Provider/Extender: Fredirick Maudlin ?[name] ?Weeks in Treatment: 6 ?Constitutional ?. . . . No acute distress. ?Respiratory ?Normal work of breathing on room air. ?Notes ?02/05/2022: There has been minimal slough accumulation in the 2 weeks since I last saw her. There is less bone palpated. No significant change in the degree of ?undermining, but overall the wound base looks clean and healthy. ?Electronic Signature(s) ?Signed: 02/05/2022 3:07:59 PM By: Fredirick Maudlin MD FACS ?Entered By: Fredirick Maudlin on 02/05/2022 15:07:59 ?-------------------------------------------------------------------------------- ?Physician Orders Details ?Patient Name: Date of Service: ?[name], [name] 02/05/2022 2:00 PM ?Medical Record Number: [id number] ?Patient Account Number: [id number] ?Date of Birth/Sex: Treating RN: ?[birth year] (88 y.o. Harlow Ohms ?Primary Care Provider: [name] Other Clinician: ?Referring Provider: ?Treating Provider/Extender: Fredirick Maudlin ?[name] ?Weeks in Treatment: 6 ?Verbal / Phone Orders: No ?Diagnosis Coding ?ICD-10 Coding ?Code Description ?L89.104 Pressure ulcer of unspecified part of back, stage 4 ?E11.622 Type 2 diabetes mellitus with other skin ulcer ?S22.089S Unspecified fracture of T11-T12 vertebra, sequela ?Follow-up Appointments ?ppointment in 2 weeks. - Dr. Celine Ahr with [facility] ?Return A ?Bathing/ Shower/ Hygiene ?May shower with protection but do not get wound dressing(s)  wet. ?Negative Presssure Wound Therapy ?SNAP Vac to wound continuously at 126m/hg pressure - run IVR ?Off-Loading ?Turn and reposition every 2 hours ?Other: - KEEP PRESSURE OFF OF BACK ?MAY USE EGG CRATE CUSHION ?Home

## 2022-02-05 NOTE — Progress Notes (Signed)
AKI, BURDIN ([id number]) ?Visit Report for 02/05/2022 ?Arrival Information Details ?Patient Name: Date of Service: ?[name], [name] 02/05/2022 2:00 PM ?Medical Record Number: [id number] ?Patient Account Number: [id number] ?Date of Birth/Sex: Treating RN: ?12-01-1922 (88 y.o. [name] ?Primary Care Margurite Duffy: [name] Other Clinician: ?Referring Britt Theard: ?Treating Raynie Steinhaus/Extender: Fredirick Maudlin ?[name] ?Weeks in Treatment: 6 ?Visit Information History Since Last Visit ?Added or deleted any medications: No ?Patient Arrived: Wheel Chair ?Any new allergies or adverse reactions: No ?Arrival Time: 14:00 ?Had a fall or experienced change in No ?Accompanied By: daughter ?activities of daily living that may affect ?Transfer Assistance: None ?risk of falls: ?Patient Identification Verified: Yes ?Signs or symptoms of abuse/neglect since last visito No ?Secondary Verification Process Completed: Yes ?Hospitalized since last visit: No ?Patient Requires Transmission-Based Precautions: No ?Implantable device outside of the clinic excluding No ?Patient Has Alerts: No ?cellular tissue based products placed in the center ?since last visit: ?Has Dressing in Place as Prescribed: Yes ?Pain Present Now: No ?Electronic Signature(s) ?Signed: 02/05/2022 5:16:31 PM By: Adline Peals ?Entered By: Adline Peals on 02/05/2022 14:04:48 ?-------------------------------------------------------------------------------- ?Encounter Discharge Information Details ?Patient Name: Date of Service: ?[name], [name] 02/05/2022 2:00 PM ?Medical Record Number: [id number] ?Patient Account Number: [id number] ?Date of Birth/Sex: Treating RN: ?08-17-1923 (88 y.o. [name] ?Primary Care [name]: [name] Other Clinician: ?Referring [name]: ?Treating Jahi Roza/Extender: Fredirick Maudlin ?[name] ?Weeks in Treatment: 6 ?Encounter Discharge Information Items Post Procedure Vitals ?Discharge Condition:  Stable ?Temperature (F): 98 ?Ambulatory Status: Wheelchair ?Pulse (bpm): 84 ?Discharge Destination: Home ?Respiratory Rate (breaths/min): 18 ?Transportation: Private Auto ?Blood Pressure (mmHg): 123/66 ?Accompanied By: daughter ?Schedule Follow-up Appointment: Yes ?Clinical Summary of Care: Patient Declined ?Electronic Signature(s) ?Signed: 02/05/2022 5:16:31 PM By: Adline Peals ?Entered By: Adline Peals on 02/05/2022 14:50:51 ?-------------------------------------------------------------------------------- ?Lower Extremity Assessment Details ?Patient Name: ?Date of Service: ?[name], [name] 02/05/2022 2:00 PM ?Medical Record Number: [id number] ?Patient Account Number: [id number] ?Date of Birth/Sex: ?Treating RN: ?08-10-1923 (88 y.o. [name] ?Primary Care [name]: [name] ?Other Clinician: ?Referring [name]: ?Treating Ethne Jeon/Extender: Fredirick Maudlin ?[name] ?Weeks in Treatment: 6 ?Electronic Signature(s) ?Signed: 02/05/2022 5:16:31 PM By: Adline Peals ?Entered By: Adline Peals on 02/05/2022 14:07:24 ?-------------------------------------------------------------------------------- ?Multi Wound Chart Details ?Patient Name: ?Date of Service: ?[name], [name] 02/05/2022 2:00 PM ?Medical Record Number: [id number] ?Patient Account Number: [id number] ?Date of Birth/Sex: ?Treating RN: ?01-Sep-1923 (88 y.o. F) ?Primary Care [name]: [name] ?Other Clinician: ?Referring [name]: ?Treating [name]/Extender: Fredirick Maudlin ?[name] ?Weeks in Treatment: 6 ?Vital Signs ?Height(in): 62 ?Pulse(bpm): 84 ?Weight(lbs): 110 ?Blood Pressure(mmHg): 123/66 ?Body Mass Index(BMI): 20.1 ?Temperature(??F): 98 ?Respiratory Rate(breaths/min): 18 ?Photos: [N/A:N/A] ?Thoracic spine N/A N/A ?Wound Location: ?Gradually Appeared N/A N/A ?Wounding Event: ?Pressure Ulcer N/A N/A ?Primary Etiology: ?Hypertension, Type II Diabetes N/A N/A ?Comorbid History: ?12/19/2020 N/A N/A ?Date  Acquired: ?6 N/A N/A ?Weeks of Treatment: ?Open N/A N/A ?Wound Status: ?No N/A N/A ?Wound Recurrence: ?1.9x1.2x0.7 N/A N/A ?Measurements L x W x D (cm) ?1.791 N/A N/A ?A (cm?) : ?rea ?1.253 N/A N/A ?Volume (cm?) : ?48.20% N/A N/A ?% Reduction in A rea: ?75.80% N/A N/A ?% Reduction in Volume: ?1 ?Starting Position 1 (o'clock): ?3 ?Ending Position 1 (o'clock): ?2.5 ?Maximum Distance 1 (cm): ?Yes N/A N/A ?Undermining: ?Category/Stage IV N/A N/A ?Classification: ?Medium N/A N/A ?Exudate A mount: ?Serosanguineous N/A N/A ?Exudate Type: ?red, brown N/A N/A ?Exudate Color: ?Well defined, not attached N/A N/A ?Wound Margin: ?Large (67-100%) N/A N/A ?Granulation Amount: ?Red, Pink N/A N/A ?Granulation Quality: ?  Small (1-33%) N/A N/A ?Necrotic Amount: ?Fat Layer (Subcutaneous Tissue): Yes N/A N/A ?Exposed Structures: ?Bone: Yes ?Fascia: No ?Tendon: No ?Muscle: No ?Joint: No ?None N/A N/A ?Epithelialization: ?Debridement - Excisional N/A N/A ?Debridement: ?Pre-procedure Verification/Time Out 14:28 N/A N/A ?Taken: ?Other N/A N/A ?Pain Control: ?Subcutaneous, Slough N/A N/A ?Tissue Debrided: ?Skin/Subcutaneous Tissue N/A N/A ?Level: ?2.28 N/A N/A ?Debridement A (sq cm): ?rea ?Curette N/A N/A ?Instrument: ?Minimum N/A N/A ?Bleeding: ?Pressure N/A N/A ?Hemostasis A chieved: ?0 N/A N/A ?Procedural Pain: ?0 N/A N/A ?Post Procedural Pain: ?Procedure was tolerated well N/A N/A ?Debridement Treatment Response: ?1.9x1.2x0.7 N/A N/A ?Post Debridement Measurements L x ?W x D (cm) ?1.253 N/A N/A ?Post Debridement Volume: (cm?) ?Category/Stage IV N/A N/A ?Post Debridement Stage: ?Debridement N/A N/A ?Procedures Performed: ?Treatment Notes ?Wound #1 (Thoracic spine) ?Cleanser ?Normal Saline ?Discharge Instruction: Cleanse the wound with Normal Saline prior to applying a clean dressing using gauze sponges, not tissue or cotton balls. ?Soap and Water ?Discharge Instruction: May shower and wash wound with dial antibacterial soap and water prior  to dressing change. ?Wound Cleanser ?Discharge Instruction: Cleanse the wound with wound cleanser prior to applying a clean dressing using gauze sponges, not tissue or cotton balls. ?Peri-Wound Care ?Skin Prep ?Discharge Instruction: Use skin prep as directed ?Topical ?Primary Dressing ?Hydrofera Blue Classic Foam, 4x4 in ?Discharge Instruction: Moisten with saline prior to applying to wound bed. Be sure to tuck into undermining at 12 0clock ?Santyl Ointment ?Discharge Instruction: Apply nickel thick amount to wound bed as instructed ?Secondary Dressing ?Zetuvit Plus 4x8 in ?Discharge Instruction: Apply over primary dressing as directed. ?Secured With ?49M Medipore H Soft Cloth Surgical T ape, 4 x 10 (in/yd) ?Discharge Instruction: Secure with tape as directed. ?Compression Wrap ?Compression Stockings ?Add-Ons ?Electronic Signature(s) ?Signed: 02/05/2022 3:00:58 PM By: Fredirick Maudlin MD FACS ?Entered By: Fredirick Maudlin on 02/05/2022 15:00:58 ?-------------------------------------------------------------------------------- ?Multi-Disciplinary Care Plan Details ?Patient Name: ?Date of Service: ?[name], [name] 02/05/2022 2:00 PM ?Medical Record Number: [id number] ?Patient Account Number: [id number] ?Date of Birth/Sex: ?Treating RN: ?[birth year] (88 y.o. [name] ?Primary Care Vonna Brabson: [name] ?Other Clinician: ?Referring Roselee Tayloe: ?Treating Volney Reierson/Extender: Fredirick Maudlin ?[name] ?Weeks in Treatment: 6 ?Multidisciplinary Care Plan reviewed with physician ?Active Inactive ?Pressure ?Nursing Diagnoses: ?Knowledge deficit related to causes and risk factors for pressure ulcer development ?Knowledge deficit related to management of pressures ulcers ?Potential for impaired tissue integrity related to pressure, friction, moisture, and shear ?Goals: ?Patient will remain free of pressure ulcers ?Date Initiated: 12/25/2021 ?Target Resolution Date: 02/19/2022 ?Goal Status: Active ?Patient/caregiver  will verbalize understanding of pressure ulcer management ?Date Initiated: 12/25/2021 ?Target Resolution Date: 02/19/2022 ?Goal Status: Active ?Interventions: ?Assess: immobility, friction, shearing,

## 2022-02-19 ENCOUNTER — Encounter (HOSPITAL_BASED_OUTPATIENT_CLINIC_OR_DEPARTMENT_OTHER): Payer: Medicare Other | Admitting: General Surgery

## 2022-02-19 NOTE — Progress Notes (Signed)
CASSIA, FEIN ([id number]) ?Visit Report for 02/19/2022 ?Arrival Information Details ?Patient Name: Date of Service: ?[name], [name] 02/19/2022 2:00 PM ?Medical Record Number: [id number] ?Patient Account Number: [id number] ?Date of Birth/Sex: Treating RN: ?09-07-1923 (88 y.o. F) [name] ?Primary Care Azari Janssens: [name] Other Clinician: ?Referring Cleaven Demario: ?Treating Kamara Allan/Extender: Fredirick Maudlin ?[name] ?Weeks in Treatment: 8 ?Visit Information History Since Last Visit ?Added or deleted any medications: No ?Patient Arrived: Wheel Chair ?Any new allergies or adverse reactions: No ?Arrival Time: 14:14 ?Had a fall or experienced change in No ?Accompanied By: spouse/daughter ?activities of daily living that may affect ?Transfer Assistance: Manual ?risk of falls: ?Patient Identification Verified: Yes ?Signs or symptoms of abuse/neglect since last visito No ?Patient Requires Transmission-Based Precautions: No ?Hospitalized since last visit: No ?Patient Has Alerts: No ?Implantable device outside of the clinic excluding No ?cellular tissue based products placed in the center ?since last visit: ?Has Dressing in Place as Prescribed: Yes ?Pain Present Now: No ?Electronic Signature(s) ?Signed: 02/19/2022 5:06:39 PM By: Dellie Catholic RN ?Entered By: Dellie Catholic on 02/19/2022 14:15:21 ?-------------------------------------------------------------------------------- ?Encounter Discharge Information Details ?Patient Name: Date of Service: ?[name], [name] 02/19/2022 2:00 PM ?Medical Record Number: [id number] ?Patient Account Number: [id number] ?Date of Birth/Sex: Treating RN: ?[birth year] (88 y.o. F) [name] ?Primary Care Aneudy Champlain: [name] Other Clinician: ?Referring Thi Klich: ?Treating Jibril Mcminn/Extender: Fredirick Maudlin ?[name] ?Weeks in Treatment: 8 ?Encounter Discharge Information Items ?Discharge Condition: Stable ?Ambulatory Status: Wheelchair ?Discharge Destination:  Home ?Transportation: Private Auto ?Accompanied By: Daughter ?Schedule Follow-up Appointment: Yes ?Clinical Summary of Care: Patient Declined ?Electronic Signature(s) ?Signed: 02/19/2022 5:06:39 PM By: Dellie Catholic RN ?Entered By: Dellie Catholic on 02/19/2022 17:05:39 ?-------------------------------------------------------------------------------- ?Lower Extremity Assessment Details ?Patient Name: ?Date of Service: ?[name], [name] 02/19/2022 2:00 PM ?Medical Record Number: [id number] ?Patient Account Number: [id number] ?Date of Birth/Sex: ?Treating RN: ?September 02, 1923 (88 y.o. F) [name] ?Primary Care Maximiano Lott: [name] ?Other Clinician: ?Referring Cayde Held: ?Treating Jakari Jacot/Extender: Fredirick Maudlin ?[name] ?Weeks in Treatment: 8 ?Electronic Signature(s) ?Signed: 02/19/2022 5:06:39 PM By: Dellie Catholic RN ?Entered By: Dellie Catholic on 02/19/2022 14:18:32 ?-------------------------------------------------------------------------------- ?Multi Wound Chart Details ?Patient Name: ?Date of Service: ?[name], [name] 02/19/2022 2:00 PM ?Medical Record Number: [id number] ?Patient Account Number: [id number] ?Date of Birth/Sex: ?Treating RN: ?05-02-1923 (88 y.o. F) Boehlein, Perry ?Primary Care Varnell Orvis: [name] ?Other Clinician: ?Referring Lakeidra Reliford: ?Treating Lukus Binion/Extender: Fredirick Maudlin ?[name] ?Weeks in Treatment: 8 ?Vital Signs ?Height(in): 62 ?Pulse(bpm): 79 ?Weight(lbs): 110 ?Blood Pressure(mmHg): 111/69 ?Body Mass Index(BMI): 20.1 ?Temperature(??F): 98 ?Respiratory Rate(breaths/min): 16 ?Photos: [N/A:N/A] ?Thoracic spine N/A N/A ?Wound Location: ?Gradually Appeared N/A N/A ?Wounding Event: ?Pressure Ulcer N/A N/A ?Primary Etiology: ?Hypertension, Type II Diabetes N/A N/A ?Comorbid History: ?12/19/2020 N/A N/A ?Date Acquired: ?8 N/A N/A ?Weeks of Treatment: ?Open N/A N/A ?Wound Status: ?No N/A N/A ?Wound Recurrence: ?1.9x1.4x0.7 N/A N/A ?Measurements L x W x D  (cm) ?2.089 N/A N/A ?A (cm?) : ?rea ?1.462 N/A N/A ?Volume (cm?) : ?39.60% N/A N/A ?% Reduction in A rea: ?71.80% N/A N/A ?% Reduction in Volume: ?1 ?Starting Position 1 (o'clock): ?4 ?Ending Position 1 (o'clock): ?2.5 ?Maximum Distance 1 (cm): ?Yes N/A N/A ?Undermining: ?Category/Stage IV N/A N/A ?Classification: ?Medium N/A N/A ?Exudate A mount: ?Serosanguineous N/A N/A ?Exudate Type: ?red, brown N/A N/A ?Exudate Color: ?Well defined, not attached N/A N/A ?Wound Margin: ?Medium (34-66%) N/A N/A ?Granulation Amount: ?Red, Pink N/A N/A ?Granulation Quality: ?Medium (34-66%) N/A N/A ?Necrotic Amount: ?Fat Layer (Subcutaneous Tissue): Yes N/A N/A ?Exposed Structures: ?Fascia: No ?  Tendon: No ?Muscle: No ?Joint: No ?Bone: No ?None N/A N/A ?Epithelialization: ?Treatment Notes ?Electronic Signature(s) ?Signed: 02/19/2022 2:37:14 PM By: Fredirick Maudlin MD FACS ?Signed: 02/19/2022 5:49:11 PM By: Baruch Gouty RN, BSN ?Entered By: Fredirick Maudlin on 02/19/2022 14:37:14 ?-------------------------------------------------------------------------------- ?Multi-Disciplinary Care Plan Details ?Patient Name: ?Date of Service: ?[name], [name] 02/19/2022 2:00 PM ?Medical Record Number: [id number] ?Patient Account Number: [id number] ?Date of Birth/Sex: ?Treating RN: ?01/23/1923 (88 y.o. F) [name] ?Primary Care Kengo Sturges: [name] ?Other Clinician: ?Referring Aran Menning: ?Treating Jhaniya Briski/Extender: Fredirick Maudlin ?[name] ?Weeks in Treatment: 8 ?Multidisciplinary Care Plan reviewed with physician ?Active Inactive ?Pressure ?Nursing Diagnoses: ?Knowledge deficit related to causes and risk factors for pressure ulcer development ?Knowledge deficit related to management of pressures ulcers ?Potential for impaired tissue integrity related to pressure, friction, moisture, and shear ?Goals: ?Patient will remain free of pressure ulcers ?Date Initiated: 12/25/2021 ?Target Resolution Date: 04/06/2022 ?Goal Status:  Active ?Patient/caregiver will verbalize understanding of pressure ulcer management ?Date Initiated: 12/25/2021 ?Target Resolution Date: 04/06/2022 ?Goal Status: Active ?Interventions: ?Assess: immobility, friction, shearing, incontinence upon admission and as needed ?Assess offloading mechanisms upon admission and as needed ?Notes: ?Wound/Skin Impairment ?Nursing Diagnoses: ?Impaired tissue integrity ?Knowledge deficit related to ulceration/compromised skin integrity ?Goals: ?Patient will have a decrease in wound volume by X% from date: (specify in notes) ?Date Initiated: 12/20/2021 ?Date Inactivated: 01/08/2022 ?Target Resolution Date: 01/12/2022 ?Goal Status: Unmet ?Unmet Reason: pressure relief ?Patient/caregiver will verbalize understanding of skin care regimen ?Date Initiated: 12/20/2021 ?Target Resolution Date: 04/06/2022 ?Goal Status: Active ?Ulcer/skin breakdown will have a volume reduction of 30% by week 4 ?Date Initiated: 12/20/2021 ?Target Resolution Date: 04/06/2022 ?Goal Status: Active ?Ulcer/skin breakdown will have a volume reduction of 50% by week 8 ?Date Initiated: 12/20/2021 ?Target Resolution Date: 04/06/2022 ?Goal Status: Active ?Interventions: ?Assess patient/caregiver ability to obtain necessary supplies ?Assess patient/caregiver ability to perform ulcer/skin care regimen upon admission and as needed ?Assess ulceration(s) every visit ?Notes: ?Electronic Signature(s) ?Signed: 02/19/2022 5:06:39 PM By: Dellie Catholic RN ?Entered By: Dellie Catholic on 02/19/2022 17:04:28 ?-------------------------------------------------------------------------------- ?Negative Pressure Wound Therapy Application (NPWT) Details ?Patient Name: ?Date of Service: ?[name], [name] 02/19/2022 2:00 PM ?Medical Record Number: [id number] ?Patient Account Number: [id number] ?Date of Birth/Sex: ?Treating RN: ?1922/10/14 (88 y.o. F) [name] ?Primary Care Kei Mcelhiney: [name] ?Other Clinician: ?Referring Emine Lopata: ?Treating  Jakyla Reza/Extender: Fredirick Maudlin ?[name] ?Weeks in Treatment: 8 ?NPWT Application Performed for: Wound #1 Thoracic spine ?Performed By: Dellie Catholic, RN ?Type: Other ?Coverage Size (sq cm): 2.66 ?Pressure T

## 2022-02-19 NOTE — Progress Notes (Addendum)
FEDRA, LANTER ([id number]) ?Visit Report for 02/19/2022 ?Chief Complaint Document Details ?Patient Name: Date of Service: ?[name], [name] 02/19/2022 2:00 PM ?Medical Record Number: [id number] ?Patient Account Number: [id number] ?Date of Birth/Sex: Treating RN: ?August 10, 1923 (88 y.o. F) Boehlein, [name] ?Primary Care Provider: [name] Other Clinician: ?Referring Provider: ?Treating Provider/Extender: Fredirick Maudlin ?[name] ?Weeks in Treatment: 8 ?Information Obtained from: Patient ?Chief Complaint ?Patient is at the clinic for treatment of an open pressure ulcer ?Electronic Signature(s) ?Signed: 02/19/2022 2:37:19 PM By: Fredirick Maudlin MD FACS ?Entered By: Fredirick Maudlin on 02/19/2022 14:37:19 ?-------------------------------------------------------------------------------- ?HPI Details ?Patient Name: Date of Service: ?[name], [name] 02/19/2022 2:00 PM ?Medical Record Number: [id number] ?Patient Account Number: [id number] ?Date of Birth/Sex: Treating RN: ?January 05, 1923 (88 y.o. F) Boehlein, [name] ?Primary Care Provider: [name] Other Clinician: ?Referring Provider: ?Treating Provider/Extender: Fredirick Maudlin ?[name] ?Weeks in Treatment: 8 ?History of Present Illness ?HPI Description: ADMISSION ?12/19/21 ?This is a 88 year old woman who is presenting to clinic today with a an open ulcer over her thoracic spine. She has severe kyphosis and a history of prior T12 ?fracture, as well as type 2 diabetes mellitus. The wound has been present for about a year. She is accompanied by her daughters, 1 of whom is a Marine scientist. They ?have been trying to offload the site by using pillows and supports to keep returned while in bed, and an eggcrate foam with a cut out for the wound for when ?she is sitting up. They have been applying Santyl to the site. She does have home health and the provider took a culture which was positive for Pseudomonas. ?She was on ciprofloxacin for this. A recent repeat culture  was negative. Most recent hemoglobin A1c was 6.5, in February. A C-reactive protein also obtained in ?February was elevated at 24.1. No imaging has been performed of the site. The patient denies significant pain. ?12/25/2021: Last week, I took a bone biopsy as well as culture. Fortunately, the bone biopsy was negative for osteomyelitis. The culture returned with rare ?corynebacterium and rare Enterococcus faecalis. We have been using Santyl and Hydrofera Blue. T oday, the wound appears cleaner and there are buds of ?granulation tissue forming around the perimeter. ?01/01/2022: Due to the positive culture, we switched to topical gentamicin. Today, she has had a little bit of a slough accumulation. The wound continues to have ?buds of granulation tissue forming. Apparently, the Hydrofera Blue was placed just over the wound opening rather than down in to contact the surface. No ?purulent drainage or odor. ?01/08/2022: Last week, there was more slough in the wound bed, so I changed back to Santyl. There is now hypertrophic granulation tissue around the wound ?opening. Once again, we seem to be having some difficulty getting the Hydrofera Blue tucked up into the wound; there is undermining that extends for 3 to 4 ?cm at the 12 o'clock position and the Hydrofera Blue is simply sitting on top of the wound without being in contact with the surface. Bone is still palpable, but ?tissue is beginning to close in over it. ?01/22/2022: The wound looks much cleaner this week and the hypertrophic granulation tissue responded appropriately to silver nitrate application. She continues ?to have undermining for about 4 cm from 12:00 to 4:00. I can still feel the bone in the base at about the 2 o'clock position, but it is less prominent and tissue is ?closing over. We have been using Santyl and Hydrofera Blue. The daughter that accompanies the patient today indicates that  they have been very diligent in ?making sure the [facility] is  tucked under and into the full base of the wound. ?02/05/2022: The wound overall is improved. The undermining is about the same but there is less bone palpable. We have been using Santyl and Hydrofera Blue. ?Minimal slough accumulation in the 2 weeks since her last visit. ?02/19/2022: The visible wound surface is very clean without any slough accumulation. Although I can feel bone in the undermined portion of the wound, it is now ?covered with a layer of tissue. The undermined area is about the same size, however. We have been using Santyl with Hydrofera Blue. She has been ?approved for snap VAC use. ?Electronic Signature(s) ?Signed: 02/19/2022 2:38:34 PM By: Fredirick Maudlin MD FACS ?Entered By: Fredirick Maudlin on 02/19/2022 14:38:34 ?-------------------------------------------------------------------------------- ?Physical Exam Details ?Patient Name: Date of Service: ?[name], [name] 02/19/2022 2:00 PM ?Medical Record Number: [id number] ?Patient Account Number: [id number] ?Date of Birth/Sex: Treating RN: ?03-Oct-1923 (88 y.o. F) Boehlein, [name] ?Primary Care Provider: [name] Other Clinician: ?Referring Provider: ?Treating Provider/Extender: Fredirick Maudlin ?[name] ?Weeks in Treatment: 8 ?Constitutional ?. . . . No acute distress. ?Respiratory ?Normal work of breathing on room air. ?Notes ?02/19/2022: The visible wound surface is very clean without any slough accumulation. Although I can feel bone in the undermined portion of the wound, it is now ?covered with a layer of tissue. The undermined area is about the same size, however. ?Electronic Signature(s) ?Signed: 02/19/2022 2:39:08 PM By: Fredirick Maudlin MD FACS ?Entered By: Fredirick Maudlin on 02/19/2022 14:39:08 ?-------------------------------------------------------------------------------- ?Physician Orders Details ?Patient Name: Date of Service: ?[name], [name] 02/19/2022 2:00 PM ?Medical Record Number: [id number] ?Patient Account Number:  [id number] ?Date of Birth/Sex: Treating RN: ?10-07-1923 (88 y.o. F) [name], [name] ?Primary Care Provider: [name] Other Clinician: ?Referring Provider: ?Treating Provider/Extender: Fredirick Maudlin ?[name] ?Weeks in Treatment: 8 ?Verbal / Phone Orders: No ?Diagnosis Coding ?Follow-up Appointments ?ppointment in 1 week. - Dr Celine Ahr with [facility] ?Return A ?Bathing/ Shower/ Hygiene ?May shower with protection but do not get wound dressing(s) wet. ?Negative Presssure Wound Therapy ?SNAP Vac to wound continuously at 15m/hg pressure - SNAP VAC change weekly ?Blue Foam - 1 piece ?Off-Loading ?Turn and reposition every 2 hours ?Other: - KEEP PRESSURE OFF OF BACK ?MAY USE EGG CRATE CUSHION ?Home Health ?No change in wound care orders this week; continue Home Health for wound care. May utilize formulary equivalent dressing for wound ?treatment orders unless otherwise specified. ?Other Home Health Orders/Instructions: -Alvis Lemmings?Wound Treatment ?Wound #1 - Thoracic spine ?Cleanser: Normal Saline (Generic) 1 x Per Day/30 Days ?Discharge Instructions: Cleanse the wound with Normal Saline prior to applying a clean dressing using gauze sponges, not tissue or cotton balls. ?Cleanser: Soap and Water 1 x Per Day/30 Days ?Discharge Instructions: May shower and wash wound with dial antibacterial soap and water prior to dressing change. ?Cleanser: Wound Cleanser (Generic) 1 x Per Day/30 Days ?Discharge Instructions: Cleanse the wound with wound cleanser prior to applying a clean dressing using gauze sponges, not tissue or cotton balls. ?Peri-Wound Care: Skin Prep 1 x Per Day/30 Days ?Discharge Instructions: Use skin prep as directed ?Prim Dressing: Hydrofera Blue Classic Foam, 4x4 in (Dispense As Written) 1 x Per Day/30 Days ?ary ?Discharge Instructions: Moisten with saline prior to applying to wound bed. Be sure to tuck into undermining at 12 0clock ?Prim Dressing: Santyl Ointment 1 x Per Day/30 Days ?ary ?Discharge  Instructions: Apply nickel thick amount to wound bed as  instructed ?Secondary Dressing: Zetuvit Plus 4x8 in (Generic) 1 x Per Day/30 Days ?Discharge Instructions: Apply over primary dressing as directed. ?Secured

## 2022-02-26 ENCOUNTER — Encounter (HOSPITAL_BASED_OUTPATIENT_CLINIC_OR_DEPARTMENT_OTHER): Payer: Medicare Other | Admitting: General Surgery

## 2022-02-26 DIAGNOSIS — L89104 Pressure ulcer of unspecified part of back, stage 4: Secondary | ICD-10-CM | POA: Diagnosis not present

## 2022-02-26 NOTE — Progress Notes (Signed)
Stacy, Perry ([id number]) Visit Report for 02/26/2022 Chief Complaint Document Details Patient Name: Date of Service: [name], [name] 02/26/2022 2:30 PM Medical Record Number: [id number] Patient Account Number: [id number] Date of Birth/Sex: Treating RN: 11/13/1922 (88 y.o. [name] Primary Care Provider: [name] Other Clinician: Referring Provider: Treating Provider/Extender: Nicholaus Corolla in Treatment: 9 Information Obtained from: Patient Chief Complaint Patient is at the clinic for treatment of an open pressure ulcer Electronic Signature(s) Signed: 02/26/2022 3:46:15 PM By: Fredirick Maudlin MD FACS Entered By: Fredirick Maudlin on 02/26/2022 15:46:15 -------------------------------------------------------------------------------- Debridement Details Patient Name: Date of Service: Stacy Like B. 02/26/2022 2:30 PM Medical Record Number: [id number] Patient Account Number: [id number] Date of Birth/Sex: Treating RN: 31-Jul-1923 (88 y.o. [name] Primary Care Provider: [name] Other Clinician: Referring Provider: Treating Provider/Extender: Nicholaus Corolla in Treatment: 9 Debridement Performed for Assessment: Wound #1 Thoracic spine Performed By: Physician Fredirick Maudlin, MD Debridement Type: Debridement Level of Consciousness (Pre-procedure): Awake and Alert Pre-procedure Verification/Time Out Yes - 15:15 Taken: Start Time: 15:15 Pain Control: Lidocaine 4% T opical Solution T Area Debrided (L x W): otal 1.5 (cm) x 1.3 (cm) = 1.95 (cm) Tissue and other material debrided: Non-Viable, Slough, Slough Level: Non-Viable Tissue Debridement Description: Selective/Open Wound Instrument: Curette Bleeding: Minimum Hemostasis Achieved: Pressure End Time: 15:17 Procedural Pain: 0 Post Procedural Pain: 0 Response to Treatment: Procedure was tolerated well Level of Consciousness (Post- Awake and  Alert procedure): Post Debridement Measurements of Total Wound Length: (cm) 1.5 Stage: Category/Stage IV Width: (cm) 1.3 Depth: (cm) 0.6 Volume: (cm) 0.919 Character of Wound/Ulcer Post Debridement: Improved Post Procedure Diagnosis Same as Pre-procedure Electronic Signature(s) Signed: 02/26/2022 3:57:57 PM By: Fredirick Maudlin MD FACS Signed: 02/26/2022 5:11:43 PM By: Dellie Catholic RN Entered By: Dellie Catholic on 02/26/2022 15:19:30 -------------------------------------------------------------------------------- HPI Details Patient Name: Date of Service: Stacy Like B. 02/26/2022 2:30 PM Medical Record Number: [id number] Patient Account Number: [id number] Date of Birth/Sex: Treating RN: 1923/02/13 (88 y.o. [name] Primary Care Provider: [name] Other Clinician: Referring Provider: Treating Provider/Extender: Nicholaus Corolla in Treatment: 9 History of Present Illness HPI Description: ADMISSION 12/19/21 This is a 88 year old woman who is presenting to clinic today with a an open ulcer over her thoracic spine. She has severe kyphosis and a history of prior T12 fracture, as well as type 2 diabetes mellitus. The wound has been present for about a year. She is accompanied by her daughters, 1 of whom is a Marine scientist. They have been trying to offload the site by using pillows and supports to keep returned while in bed, and an eggcrate foam with a cut out for the wound for when she is sitting up. They have been applying Santyl to the site. She does have home health and the provider took a culture which was positive for Pseudomonas. She was on ciprofloxacin for this. A recent repeat culture was negative. Most recent hemoglobin A1c was 6.5, in February. A C-reactive protein also obtained in February was elevated at 24.1. No imaging has been performed of the site. The patient denies significant pain. 12/25/2021: Last week, I took a bone biopsy as well as  culture. Fortunately, the bone biopsy was negative for osteomyelitis. The culture returned with rare corynebacterium and rare Enterococcus faecalis. We have been using Santyl and Hydrofera Blue. T oday, the wound appears cleaner and there are buds of granulation tissue forming around the perimeter. 01/01/2022: Due to the positive culture,  we switched to topical gentamicin. Today, she has had a little bit of a slough accumulation. The wound continues to have buds of granulation tissue forming. Apparently, the Hydrofera Blue was placed just over the wound opening rather than down in to contact the surface. No purulent drainage or odor. 01/08/2022: Last week, there was more slough in the wound bed, so I changed back to Santyl. There is now hypertrophic granulation tissue around the wound opening. Once again, we seem to be having some difficulty getting the Hydrofera Blue tucked up into the wound; there is undermining that extends for 3 to 4 cm at the 12 o'clock position and the Hydrofera Blue is simply sitting on top of the wound without being in contact with the surface. Bone is still palpable, but tissue is beginning to close in over it. 01/22/2022: The wound looks much cleaner this week and the hypertrophic granulation tissue responded appropriately to silver nitrate application. She continues to have undermining for about 4 cm from 12:00 to 4:00. I can still feel the bone in the base at about the 2 o'clock position, but it is less prominent and tissue is closing over. We have been using Santyl and Hydrofera Blue. The daughter that accompanies the patient today indicates that they have been very diligent in making sure the [facility] is tucked under and into the full base of the wound. 02/05/2022: The wound overall is improved. The undermining is about the same but there is less bone palpable. We have been using Santyl and Hydrofera Blue. Minimal slough accumulation in the 2 weeks since her last  visit. 02/19/2022: The visible wound surface is very clean without any slough accumulation. Although I can feel bone in the undermined portion of the wound, it is now covered with a layer of tissue. The undermined area is about the same size, however. We have been using Santyl with Hydrofera Blue. She has been approved for snap VAC use. 02/26/2022: Last week, we applied a snap VAC. The wound dimensions have come in by a bit in all directions. The VAC canister did fill, however, and was starting to leak so the patient's daughter removed the VAC and dressed the wound as we had been previously. There is a little bit of thin slough on the wound surface with some senescent tissue at the inferior margin. Bone is still palpable but covered with a layer of tissue. No odor. Electronic Signature(s) Signed: 02/26/2022 3:47:41 PM By: Fredirick Maudlin MD FACS Entered By: Fredirick Maudlin on 02/26/2022 15:47:41 -------------------------------------------------------------------------------- Physical Exam Details Patient Name: Date of Service: MALICIA, BLASDEL 02/26/2022 2:30 PM Medical Record Number: [id number] Patient Account Number: [id number] Date of Birth/Sex: Treating RN: March 24, 1923 (88 y.o. [name] Primary Care Provider: [name] Other Clinician: Referring Provider: Treating Provider/Extender: Nicholaus Corolla in Treatment: 9 Constitutional Slightly hypertensive. . . . No acute distress. Respiratory Normal work of breathing on room air. Notes 02/26/2022: The wound dimensions have come in by a bit in all directions. There is a little bit of thin slough on the wound surface with some senescent tissue at the inferior margin. Bone is still palpable but covered with a layer of tissue. No odor. Electronic Signature(s) Signed: 02/26/2022 3:48:29 PM By: Fredirick Maudlin MD FACS Entered By: Fredirick Maudlin on 02/26/2022  15:48:29 -------------------------------------------------------------------------------- Physician Orders Details Patient Name: Date of Service: Marijean Heath 02/26/2022 2:30 PM Medical Record Number: [id number] Patient Account Number: [id number] Date of Birth/Sex: Treating RN: August 09, 1923 (88  y.o. [name] Primary Care Provider: [name] Other Clinician: Referring Provider: Treating Provider/Extender: Nicholaus Corolla in Treatment: 9 Verbal / Phone Orders: No Diagnosis Coding ICD-10 Coding Code Description L89.104 Pressure ulcer of unspecified part of back, stage 4 E11.622 Type 2 diabetes mellitus with other skin ulcer S22.089S Unspecified fracture of T11-T12 vertebra, sequela Follow-up Appointments ppointment in 1 week. - Dr Celine Ahr with [facility] Tuesday 5/30 at 2pm Return A Bathing/ Shower/ Hygiene May shower with protection but do not get wound dressing(s) wet. Negative Presssure Wound Therapy SNAP Vac to wound continuously at 169m/hg pressure - SNAP VAC change weekly Blue Foam - 1 piece Off-Loading Turn and reposition every 2 hours Other: - KEEP PRESSURE OFF OF BACK MAY USE EGG CRATE CUSHION Home Health No change in wound care orders this week; continue Home Health for wound care. May utilize formulary equivalent dressing for wound treatment orders unless otherwise specified. Other Home Health Orders/Instructions: - [facility] Wound Treatment Wound #1 - Thoracic spine Cleanser: Normal Saline (Generic) 1 x Per Day/30 Days Discharge Instructions: Cleanse the wound with Normal Saline prior to applying a clean dressing using gauze sponges, not tissue or cotton balls. Cleanser: Soap and Water 1 x Per Day/30 Days Discharge Instructions: May shower and wash wound with dial antibacterial soap and water prior to dressing change. Cleanser: Wound Cleanser (Generic) 1 x Per Day/30 Days Discharge Instructions: Cleanse the wound with wound cleanser  prior to applying a clean dressing using gauze sponges, not tissue or cotton balls. Peri-Wound Care: Skin Prep 1 x Per Day/30 Days Discharge Instructions: Use skin prep as directed Prim Dressing: Hydrofera Blue Classic Foam, 4x4 in (Dispense As Written) 1 x Per Day/30 Days ary Discharge Instructions: Moisten with saline prior to applying to wound bed. Be sure to tuck into undermining at 12 0clock Prim Dressing: Santyl Ointment 1 x Per Day/30 Days ary Discharge Instructions: Apply nickel thick amount to wound bed as instructed Secondary Dressing: Zetuvit Plus 4x8 in (Generic) 1 x Per Day/30 Days Discharge Instructions: Apply over primary dressing as directed. Secured With: 30M Medipore H Soft Cloth Surgical T ape, 4 x 10 (in/yd) 1 x Per Day/30 Days Discharge Instructions: Secure with tape as directed. Electronic Signature(s) Signed: 02/26/2022 3:57:57 PM By: CFredirick MaudlinMD FACS Entered By: CFredirick Maudlinon 02/26/2022 15:48:45 -------------------------------------------------------------------------------- Problem List Details Patient Name: Date of Service: HMarijean Heath5/22/2023 2:30 PM Medical Record Number: [id number] Account Number: [id number] of Birth/Sex: Treating RN: [birth year](88y.o. FAmerica BrownPrimary Care Provider: RJanace LittenOther Clinician: Referring Provider: Treating Provider/Extender: CNicholaus Corollain Treatment: 9 Active Problems ICD-10 Encounter Code Description Active Date MDM Diagnosis L89.104 Pressure ulcer of unspecified part of back, stage 4 12/19/2021 No Yes E11.622 Type 2 diabetes mellitus with other skin ulcer 12/19/2021 No Yes S22.089S Unspecified fracture of T11-T12 vertebra, sequela 12/19/2021 No Yes Inactive Problems Resolved Problems Electronic Signature(s) Signed: 02/26/2022 3:45:23 PM By: CFredirick MaudlinMD FACS Entered By: CFredirick Maudlinon 02/26/2022  15:45:22 -------------------------------------------------------------------------------- Progress Note Details Patient Name: Date of Service: HJim LikeB. 02/26/2022 2:30 PM Medical Record Number: [id number] Account Number: [id number] of Birth/Sex: Treating RN: 7April 24, 1924(88y.o. FAmerica BrownPrimary Care Provider: RJanace LittenOther Clinician: Referring Provider: Treating Provider/Extender: CNicholaus Corollain Treatment: 9 Subjective Chief Complaint Information obtained from Patient Patient is at the clinic for treatment of an open pressure ulcer History of Present Illness (HPI)  ADMISSION 12/19/21 This is a 88 year old woman who is presenting to clinic today with a an open ulcer over her thoracic spine. She has severe kyphosis and a history of prior T12 fracture, as well as type 2 diabetes mellitus. The wound has been present for about a year. She is accompanied by her daughters, 1 of whom is a Marine scientist. They have been trying to offload the site by using pillows and supports to keep returned while in bed, and an eggcrate foam with a cut out for the wound for when she is sitting up. They have been applying Santyl to the site. She does have home health and the provider took a culture which was positive for Pseudomonas. She was on ciprofloxacin for this. A recent repeat culture was negative. Most recent hemoglobin A1c was 6.5, in February. A C-reactive protein also obtained in February was elevated at 24.1. No imaging has been performed of the site. The patient denies significant pain. 12/25/2021: Last week, I took a bone biopsy as well as culture. Fortunately, the bone biopsy was negative for osteomyelitis. The culture returned with rare corynebacterium and rare Enterococcus faecalis. We have been using Santyl and Hydrofera Blue. T oday, the wound appears cleaner and there are buds of granulation tissue forming around the perimeter. 01/01/2022:  Due to the positive culture, we switched to topical gentamicin. Today, she has had a little bit of a slough accumulation. The wound continues to have buds of granulation tissue forming. Apparently, the Hydrofera Blue was placed just over the wound opening rather than down in to contact the surface. No purulent drainage or odor. 01/08/2022: Last week, there was more slough in the wound bed, so I changed back to Santyl. There is now hypertrophic granulation tissue around the wound opening. Once again, we seem to be having some difficulty getting the Hydrofera Blue tucked up into the wound; there is undermining that extends for 3 to 4 cm at the 12 o'clock position and the Hydrofera Blue is simply sitting on top of the wound without being in contact with the surface. Bone is still palpable, but tissue is beginning to close in over it. 01/22/2022: The wound looks much cleaner this week and the hypertrophic granulation tissue responded appropriately to silver nitrate application. She continues to have undermining for about 4 cm from 12:00 to 4:00. I can still feel the bone in the base at about the 2 o'clock position, but it is less prominent and tissue is closing over. We have been using Santyl and Hydrofera Blue. The daughter that accompanies the patient today indicates that they have been very diligent in making sure the [facility] is tucked under and into the full base of the wound. 02/05/2022: The wound overall is improved. The undermining is about the same but there is less bone palpable. We have been using Santyl and Hydrofera Blue. Minimal slough accumulation in the 2 weeks since her last visit. 02/19/2022: The visible wound surface is very clean without any slough accumulation. Although I can feel bone in the undermined portion of the wound, it is now covered with a layer of tissue. The undermined area is about the same size, however. We have been using Santyl with Hydrofera Blue. She has  been approved for snap VAC use. 02/26/2022: Last week, we applied a snap VAC. The wound dimensions have come in by a bit in all directions. The VAC canister did fill, however, and was starting to leak so the patient's daughter removed the VAC and  dressed the wound as we had been previously. There is a little bit of thin slough on the wound surface with some senescent tissue at the inferior margin. Bone is still palpable but covered with a layer of tissue. No odor. Patient History Family History Unknown History. Social History Never smoker, Marital Status - Married, Alcohol Use - Never, Drug Use - No History, Caffeine Use - Rarely. Medical History Ear/Nose/Mouth/Throat Denies history of Chronic sinus problems/congestion, Middle ear problems Cardiovascular Patient has history of Hypertension Endocrine Patient has history of Type II Diabetes Immunological Denies history of Lupus Erythematosus, Raynaudoos, Scleroderma Oncologic Denies history of Received Chemotherapy, Received Radiation Hospitalization/Surgery History - t/11 t/12 FRACTURE/SURGERY. - FEMUR FX SURGERY. - TOTAL HYSTERECTOMY. - CHOLECYSTECTOMY. - TONSILLECTOMY. Medical A Surgical History Notes nd Ear/Nose/Mouth/Throat PARTIAL HEARING LEFT HEAR W/ HEARING AID TOTAL DEAFNESS RIGHT EAR Gastrointestinal Melena Endocrine Hypothyroidism Hyperlipidemia associated with type 2 diabetes mellitus (HCC) Musculoskeletal Closed T12 fracture (HCC) Hip fracture (HCC) Closed comminuted intertrochanteric fracture of proximal end of right femur (HCC) Objective Constitutional Slightly hypertensive. No acute distress. Vitals Time Taken: 2:41 PM, Height: 62 in, Weight: 110 lbs, BMI: 20.1, Temperature: 97.8 F, Pulse: 96 bpm, Respiratory Rate: 16 breaths/min, Blood Pressure: 149/76 mmHg. Respiratory Normal work of breathing on room air. General Notes: 02/26/2022: The wound dimensions have come in by a bit in all directions. There is a  little bit of thin slough on the wound surface with some senescent tissue at the inferior margin. Bone is still palpable but covered with a layer of tissue. No odor. Integumentary (Hair, Skin) Wound #1 status is Open. Original cause of wound was Gradually Appeared. The date acquired was: 12/19/2020. The wound has been in treatment 9 weeks. The wound is located on the Thoracic spine. The wound measures 1.5cm length x 1.3cm width x 0.6cm depth; 1.532cm^2 area and 0.919cm^3 volume. There is Fat Layer (Subcutaneous Tissue) exposed. There is no tunneling noted, however, there is undermining starting at 1:00 and ending at 5:00 with a maximum distance of 1cm. There is a medium amount of serosanguineous drainage noted. The wound margin is well defined and not attached to the wound base. There is medium (34-66%) red, pink granulation within the wound bed. There is a medium (34-66%) amount of necrotic tissue within the wound bed. Assessment Active Problems ICD-10 Pressure ulcer of unspecified part of back, stage 4 Type 2 diabetes mellitus with other skin ulcer Unspecified fracture of T11-T12 vertebra, sequela Procedures Wound #1 Pre-procedure diagnosis of Wound #1 is a Pressure Ulcer located on the Thoracic spine . There was a Selective/Open Wound Non-Viable Tissue Debridement with a total area of 1.95 sq cm performed by Fredirick Maudlin, MD. With the following instrument(s): Curette to remove Non-Viable tissue/material. Material removed includes [facility] after achieving pain control using Lidocaine 4% Topical Solution. No specimens were taken. A time out was conducted at 15:15, prior to the start of the procedure. A Minimum amount of bleeding was controlled with Pressure. The procedure was tolerated well with a pain level of 0 throughout and a pain level of 0 following the procedure. Post Debridement Measurements: 1.5cm length x 1.3cm width x 0.6cm depth; 0.919cm^3 volume. Post debridement Stage noted as  Category/Stage IV. Character of Wound/Ulcer Post Debridement is improved. Post procedure Diagnosis Wound #1: Same as Pre-Procedure Plan Follow-up Appointments: Return Appointment in 1 week. - Dr Celine Ahr with [facility] Tuesday 5/30 at 2pm Bathing/ Shower/ Hygiene: May shower with protection but do not get wound dressing(s) wet. Negative Presssure Wound  Therapy: SNAP Vac to wound continuously at 168m/hg pressure - SNAP VAC change weekly Blue Foam - 1 piece Off-Loading: Turn and reposition every 2 hours Other: - KEEP PRESSURE OFF OF BACK MAY USE EGG CRATE CUSHION Home Health: No change in wound care orders this week; continue Home Health for wound care. May utilize formulary equivalent dressing for wound treatment orders unless otherwise specified. Other Home Health Orders/Instructions: -[facility] LemmingsWOUND #1: - Thoracic spine Wound Laterality: Cleanser: Normal Saline (Generic) 1 x Per Day/30 Days Discharge Instructions: Cleanse the wound with Normal Saline prior to applying a clean dressing using gauze sponges, not tissue or cotton balls. Cleanser: Soap and Water 1 x Per Day/30 Days Discharge Instructions: May shower and wash wound with dial antibacterial soap and water prior to dressing change. Cleanser: Wound Cleanser (Generic) 1 x Per Day/30 Days Discharge Instructions: Cleanse the wound with wound cleanser prior to applying a clean dressing using gauze sponges, not tissue or cotton balls. Peri-Wound Care: Skin Prep 1 x Per Day/30 Days Discharge Instructions: Use skin prep as directed Prim Dressing: Hydrofera Blue Classic Foam, 4x4 in (Dispense As Written) 1 x Per Day/30 Days ary Discharge Instructions: Moisten with saline prior to applying to wound bed. Be sure to tuck into undermining at 12 0clock Prim Dressing: Santyl Ointment 1 x Per Day/30 Days ary Discharge Instructions: Apply nickel thick amount to wound bed as instructed Secondary Dressing: Zetuvit Plus 4x8 in (Generic) 1 x Per  Day/30 Days Discharge Instructions: Apply over primary dressing as directed. Secured With: 36M Medipore H Soft Cloth Surgical T ape, 4 x 10 (in/yd) 1 x Per Day/30 Days Discharge Instructions: Secure with tape as directed. 02/26/2022: The wound dimensions have come in by a bit in all directions. There is a little bit of thin slough on the wound surface with some senescent tissue at the inferior margin. Bone is still palpable but covered with a layer of tissue. No odor. I used a curette to debride the thin layer of slough from the wound surface as well as the senescent tissue from the inferior margin perimeter. We will continue using the snap VAC as we seem to have gotten a nice result with just 1 week of therapy. I will see her back in 1 week's time. Electronic Signature(s) Signed: 02/26/2022 3:49:24 PM By: CFredirick MaudlinMD FACS Entered By: CFredirick Maudlinon 02/26/2022 15:49:24 -------------------------------------------------------------------------------- HxROS Details Patient Name: Date of Service: HJim LikeB. 02/26/2022 2:30 PM Medical Record Number: [id number] Account Number: [id number] of Birth/Sex: Treating RN: [birth year](88y.o. FAmerica BrownPrimary Care Provider: RJanace LittenOther Clinician: Referring Provider: Treating Provider/Extender: CNicholaus Corollain Treatment: 9 Ear/Nose/Mouth/Throat Medical History: Negative for: Chronic sinus problems/congestion; Middle ear problems Past Medical History Notes: PARTIAL HEARING LEFT HEAR W/ HEARING AID TOTAL DEAFNESS RIGHT EAR Cardiovascular Medical History: Positive for: Hypertension Gastrointestinal Medical History: Past Medical History Notes: Melena Endocrine Medical History: Positive for: Type II Diabetes Past Medical History Notes: Hypothyroidism Hyperlipidemia associated with type 2 diabetes mellitus ([place] Treated with: Diet Immunological Medical History: Negative  for: Lupus Erythematosus; Raynauds; Scleroderma Musculoskeletal Medical History: Past Medical History Notes: Closed T12 fracture ([facility] Hip fracture ([place] Closed comminuted intertrochanteric fracture of proximal end of right femur ([facility] Oncologic Medical History: Negative for: Received Chemotherapy; Received Radiation Immunizations Pneumococcal Vaccine: Received Pneumococcal Vaccination: Yes Received Pneumococcal Vaccination On or After 60th Birthday: No Implantable Devices None Hospitalization / Surgery History Type of Hospitalization/Surgery t/11 t/12 FRACTURE/SURGERY FEMUR  FX SURGERY TOTAL HYSTERECTOMY CHOLECYSTECTOMY TONSILLECTOMY Family and Social History Unknown History: Yes; Never smoker; Marital Status - Married; Alcohol Use: Never; Drug Use: No History; Caffeine Use: Rarely; Financial Concerns: No; Food, Clothing or Shelter Needs: No; Support System Lacking: No; Transportation Concerns: No Engineer, maintenance) Signed: 02/26/2022 3:57:57 PM By: Fredirick Maudlin MD FACS Signed: 02/26/2022 5:11:43 PM By: Dellie Catholic RN Entered By: Fredirick Maudlin on 02/26/2022 15:47:47 -------------------------------------------------------------------------------- SuperBill Details Patient Name: Date of Service: Marijean Heath 02/26/2022 Medical Record Number: [id number] Patient Account Number: [id number] Date of Birth/Sex: Treating RN: [birth year] (88 y.o. 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

## 2022-02-27 NOTE — Progress Notes (Signed)
BREAN, CARBERRY ([id number]) Visit Report for 02/26/2022 Arrival Information Details Patient Name: Date of Service: METZTLI, SACHDEV 02/26/2022 2:30 PM Medical Record Number: [id number] Patient Account Number: [id number] Date of Birth/Sex: Treating RN: 06-16-1923 (88 y.o. [name] Primary Care Ashtyn Freilich: [name] Other Clinician: Referring Dannon Nguyenthi: Treating Quentina Fronek/Extender: Nicholaus Corolla in Treatment: 9 Visit Information History Since Last Visit Added or deleted any medications: No Patient Arrived: Wheel Chair Any new allergies or adverse reactions: No Arrival Time: 14:39 Had a fall or experienced change in No Accompanied By: daughter/son in law activities of daily living that may affect Transfer Assistance: Manual risk of falls: Patient Identification Verified: Yes Signs or symptoms of abuse/neglect since last visito No Secondary Verification Process Completed: Yes Hospitalized since last visit: No Patient Requires Transmission-Based Precautions: No Implantable device outside of the clinic excluding No Patient Has Alerts: No cellular tissue based products placed in the center since last visit: Has Dressing in Place as Prescribed: Yes Pain Present Now: Yes Electronic Signature(s) Signed: 02/27/2022 7:59:49 AM By: Sandre Kitty Entered By: Sandre Kitty on 02/26/2022 14:41:03 -------------------------------------------------------------------------------- Encounter Discharge Information Details Patient Name: Date of Service: [name] 02/26/2022 2:30 PM Medical Record Number: [id number] Patient Account Number: [id number] Date of Birth/Sex: Treating RN: 1923/07/11 (88 y.o. [name] Primary Care Geraldo Haris: [name] Other Clinician: Referring Rockell Faulks: Treating Earmon Sherrow/Extender: Nicholaus Corolla in Treatment: 9 Encounter Discharge Information Items Post Procedure Vitals Discharge  Condition: Stable Temperature (F): 97.8 Ambulatory Status: Ambulatory Pulse (bpm): 96 Discharge Destination: Home Respiratory Rate (breaths/min): 16 Transportation: Private Auto Blood Pressure (mmHg): 149/76 Accompanied By: self Schedule Follow-up Appointment: Yes Clinical Summary of Care: Electronic Signature(s) Signed: 02/26/2022 5:11:43 PM By: Dellie Catholic RN Entered By: Dellie Catholic on 02/26/2022 17:11:17 -------------------------------------------------------------------------------- Lower Extremity Assessment Details Patient Name: Date of Service: LAKIE, MCLOUTH 02/26/2022 2:30 PM Medical Record Number: [id number] Patient Account Number: [id number] Date of Birth/Sex: Treating RN: 1923-08-21 (88 y.o. [name] Primary Care Marquite Attwood: [name] Other Clinician: Referring Romelle Reiley: Treating Ohm Dentler/Extender: Nicholaus Corolla in Treatment: 9 Electronic Signature(s) Signed: 02/26/2022 5:11:43 PM By: Dellie Catholic RN Entered By: Dellie Catholic on 02/26/2022 15:09:17 -------------------------------------------------------------------------------- Multi Wound Chart Details Patient Name: Date of Service: Marijean Heath 02/26/2022 2:30 PM Medical Record Number: [id number] Patient Account Number: [id number] Date of Birth/Sex: Treating RN: May 12, 1923 (88 y.o. [name] Primary Care Harper Vandervoort: [name] Other Clinician: Referring May Ozment: Treating Anaysia Germer/Extender: Nicholaus Corolla in Treatment: 9 Vital Signs Height(in): 62 Pulse(bpm): 26 Weight(lbs): 110 Blood Pressure(mmHg): 149/76 Body Mass Index(BMI): 20.1 Temperature(F): 97.8 Respiratory Rate(breaths/min): 16 Photos: [N/A:N/A] Thoracic spine N/A N/A Wound Location: Gradually Appeared N/A N/A Wounding Event: Pressure Ulcer N/A N/A Primary Etiology: Hypertension, Type II Diabetes N/A N/A Comorbid History: 12/19/2020 N/A  N/A Date Acquired: 9 N/A N/A Weeks of Treatment: Open N/A N/A Wound Status: No N/A N/A Wound Recurrence: 1.5x1.3x0.6 N/A N/A Measurements L x W x D (cm) 1.532 N/A N/A A (cm) : rea 0.919 N/A N/A Volume (cm) : 55.70% N/A N/A % Reduction in A rea: 82.30% N/A N/A % Reduction in Volume: 1 Starting Position 1 (o'clock): 5 Ending Position 1 (o'clock): 1 Maximum Distance 1 (cm): Yes N/A N/A Undermining: Category/Stage IV N/A N/A Classification: Medium N/A N/A Exudate A mount: Serosanguineous N/A N/A Exudate Type: red, brown N/A N/A Exudate Color: Well defined, not attached N/A N/A Wound Margin: Medium (34-66%) N/A N/A Granulation Amount: Red, Pink  N/A N/A Granulation Quality: Medium (34-66%) N/A N/A Necrotic Amount: Fat Layer (Subcutaneous Tissue): Yes N/A N/A Exposed Structures: Fascia: No Tendon: No Muscle: No Joint: No Bone: No None N/A N/A Epithelialization: Debridement - Selective/Open Wound N/A N/A Debridement: Pre-procedure Verification/Time Out 15:15 N/A N/A Taken: Lidocaine 4% Topical Solution N/A N/A Pain Control: Slough N/A N/A Tissue Debrided: Non-Viable Tissue N/A N/A Level: 1.95 N/A N/A Debridement A (sq cm): rea Curette N/A N/A Instrument: Minimum N/A N/A Bleeding: Pressure N/A N/A Hemostasis A chieved: 0 N/A N/A Procedural Pain: 0 N/A N/A Post Procedural Pain: Procedure was tolerated well N/A N/A Debridement Treatment Response: 1.5x1.3x0.6 N/A N/A Post Debridement Measurements L x W x D (cm) 0.919 N/A N/A Post Debridement Volume: (cm) Category/Stage IV N/A N/A Post Debridement Stage: Debridement N/A N/A Procedures Performed: Treatment Notes Electronic Signature(s) Signed: 02/26/2022 3:46:09 PM By: Fredirick Maudlin MD FACS Signed: 02/26/2022 5:11:43 PM By: Dellie Catholic RN Entered By: Fredirick Maudlin on 02/26/2022  15:46:09 -------------------------------------------------------------------------------- Multi-Disciplinary Care Plan Details Patient Name: Date of Service: [name] 02/26/2022 2:30 PM Medical Record Number: [id number] Patient Account Number: [id number] Date of Birth/Sex: Treating RN: 13-Mar-1923 (88 y.o. [name] Primary Care Milan Clare: [name] Other Clinician: Referring Davie Sagona: Treating Zebedee Segundo/Extender: Nicholaus Corolla in Treatment: 9 Multidisciplinary Care Plan reviewed with physician Active Inactive Pressure Nursing Diagnoses: Knowledge deficit related to causes and risk factors for pressure ulcer development Knowledge deficit related to management of pressures ulcers Potential for impaired tissue integrity related to pressure, friction, moisture, and shear Goals: Patient will remain free of pressure ulcers Date Initiated: 12/25/2021 Target Resolution Date: 04/06/2022 Goal Status: Active Patient/caregiver will verbalize understanding of pressure ulcer management Date Initiated: 12/25/2021 Target Resolution Date: 04/06/2022 Goal Status: Active Interventions: Assess: immobility, friction, shearing, incontinence upon admission and as needed Assess offloading mechanisms upon admission and as needed Notes: Wound/Skin Impairment Nursing Diagnoses: Impaired tissue integrity Knowledge deficit related to ulceration/compromised skin integrity Goals: Patient will have a decrease in wound volume by X% from date: (specify in notes) Date Initiated: 12/20/2021 Date Inactivated: 01/08/2022 Target Resolution Date: 01/12/2022 Goal Status: Unmet Unmet Reason: pressure relief Patient/caregiver will verbalize understanding of skin care regimen Date Initiated: 12/20/2021 Target Resolution Date: 04/06/2022 Goal Status: Active Ulcer/skin breakdown will have a volume reduction of 30% by week 4 Date Initiated: 12/20/2021 Target Resolution Date:  04/06/2022 Goal Status: Active Ulcer/skin breakdown will have a volume reduction of 50% by week 8 Date Initiated: 12/20/2021 Target Resolution Date: 04/06/2022 Goal Status: Active Interventions: Assess patient/caregiver ability to obtain necessary supplies Assess patient/caregiver ability to perform ulcer/skin care regimen upon admission and as needed Assess ulceration(s) every visit Notes: Electronic Signature(s) Signed: 02/26/2022 5:11:43 PM By: Dellie Catholic RN Entered By: Dellie Catholic on 02/26/2022 17:09:34 -------------------------------------------------------------------------------- Pain Assessment Details Patient Name: Date of Service: CLARANN, HELVEY 02/26/2022 2:30 PM Medical Record Number: [id number] Patient Account Number: [id number] Date of Birth/Sex: Treating RN: [birth year] (88 y.o. [name] Primary Care Dashel Goines: [name] Other Clinician: Referring Blessin Kanno: Treating Loyal Holzheimer/Extender: Nicholaus Corolla in Treatment: 9 Active Problems Location of Pain Severity and Description of Pain Patient Has Paino Yes Site Locations Rate the pain. Current Pain Level: 4 Pain Management and Medication Current Pain Management: Electronic Signature(s) Signed: 02/26/2022 5:11:43 PM By: Dellie Catholic RN Signed: 02/27/2022 7:59:49 AM By: Sandre Kitty Entered By: Sandre Kitty on 02/26/2022 14:41:27 -------------------------------------------------------------------------------- Patient/Caregiver Education Details Patient Name: Date of Service: [name], Sandie Ano 5/22/2023andnbsp2:30 PM Medical Record Number:  [id number] Patient Account Number: [id number] Date of Birth/Gender: Treating RN: 04-08-1923 (88 y.o. [name] Primary Care Physician: [name] Other Clinician: Referring Physician: Treating Physician/Extender: Nicholaus Corolla in Treatment: 9 Education Assessment Education  Provided To: Patient Education Topics Provided Wound/Skin Impairment: Methods: Explain/Verbal Responses: Return demonstration correctly Electronic Signature(s) Signed: 02/26/2022 5:11:43 PM By: Dellie Catholic RN Entered By: Dellie Catholic on 02/26/2022 17:09:48 -------------------------------------------------------------------------------- Wound Assessment Details Patient Name: Date of Service: KAILE, BIXLER 02/26/2022 2:30 PM Medical Record Number: [id number] Patient Account Number: [id number] Date of Birth/Sex: Treating RN: 1923-01-09 (88 y.o. [name] Primary Care Carry Weesner: [name] Other Clinician: Referring Ralphie Lovelady: Treating Ronnel Zuercher/Extender: Nicholaus Corolla in Treatment: 9 Wound Status Wound Number: 1 Primary Etiology: Pressure Ulcer Wound Location: Thoracic spine Wound Status: Open Wounding Event: Gradually Appeared Comorbid History: Hypertension, Type II Diabetes Date Acquired: 12/19/2020 Weeks Of Treatment: 9 Clustered Wound: No Photos Wound Measurements Length: (cm) 1.5 Width: (cm) 1.3 Depth: (cm) 0.6 Area: (cm) 1.532 Volume: (cm) 0.919 % Reduction in Area: 55.7% % Reduction in Volume: 82.3% Epithelialization: None Tunneling: No Undermining: Yes Starting Position (o'clock): 1 Ending Position (o'clock): 5 Maximum Distance: (cm) 1 Wound Description Classification: Category/Stage IV Wound Margin: Well defined, not attached Exudate Amount: Medium Exudate Type: Serosanguineous Exudate Color: red, brown Foul Odor After Cleansing: No Slough/Fibrino Yes Wound Bed Granulation Amount: Medium (34-66%) Exposed Structure Granulation Quality: Red, Pink Fascia Exposed: No Necrotic Amount: Medium (34-66%) Fat Layer (Subcutaneous Tissue) Exposed: Yes Tendon Exposed: No Muscle Exposed: No Joint Exposed: No Bone Exposed: No Treatment Notes Wound #1 (Thoracic spine) Cleanser Normal Saline Discharge Instruction:  Cleanse the wound with Normal Saline prior to applying a clean dressing using gauze sponges, not tissue or cotton balls. Soap and Water Discharge Instruction: May shower and wash wound with dial antibacterial soap and water prior to dressing change. Wound Cleanser Discharge Instruction: Cleanse the wound with wound cleanser prior to applying a clean dressing using gauze sponges, not tissue or cotton balls. Peri-Wound Care Skin Prep Discharge Instruction: Use skin prep as directed Topical Primary Dressing Hydrofera Blue Classic Foam, 4x4 in Discharge Instruction: Moisten with saline prior to applying to wound bed. Be sure to tuck into undermining at 12 0clock Santyl Ointment Discharge Instruction: Apply nickel thick amount to wound bed as instructed Secondary Dressing Zetuvit Plus 4x8 in Discharge Instruction: Apply over primary dressing as directed. Secured With 41M Medipore H Soft Cloth Surgical T ape, 4 x 10 (in/yd) Discharge Instruction: Secure with tape as directed. Compression Wrap Compression Stockings Add-Ons Electronic Signature(s) Signed: 02/26/2022 5:11:43 PM By: Dellie Catholic RN Entered By: Dellie Catholic on 02/26/2022 15:27:51 -------------------------------------------------------------------------------- Vitals Details Patient Name: Date of Service: [name] 02/26/2022 2:30 PM Medical Record Number: [id number] Patient Account Number: [id number] Date of Birth/Sex: Treating RN: 02-14-1923 (88 y.o. [name] Primary Care Vicki Chaffin: [name] Other Clinician: Referring [place] Stell: Treating Guiseppe Flanagan/Extender: Nicholaus Corolla in Treatment: 9 Vital Signs Time Taken: 14:41 Temperature (F): 97.8 Height (in): 62 Pulse (bpm): 96 Weight (lbs): 110 Respiratory Rate (breaths/min): 16 Body Mass Index (BMI): 20.1 Blood Pressure (mmHg): 149/76 Reference Range: 80 - 120 mg / dl Electronic Signature(s) Signed: 02/27/2022  7:59:49 AM By: Sandre Kitty Entered By: Sandre Kitty on 02/26/2022 14:41:19

## 2022-03-06 ENCOUNTER — Encounter (HOSPITAL_BASED_OUTPATIENT_CLINIC_OR_DEPARTMENT_OTHER): Payer: Medicare Other | Admitting: General Surgery

## 2022-03-06 DIAGNOSIS — L89104 Pressure ulcer of unspecified part of back, stage 4: Secondary | ICD-10-CM | POA: Diagnosis not present

## 2022-03-12 NOTE — Progress Notes (Signed)
Stacy, [name] ([id number]) Visit Report for 03/06/2022 Arrival Information Details Patient Name: Date of Service: [name], [name] 03/06/2022 2:00 PM Medical Record Number: [id number] Patient Account Number: [id number] Date of Birth/Sex: Treating RN: Aug 25, 1923 (88 y.o. [name] Primary Care Terrill Wauters: [name] Other Clinician: Referring Lelah Rennaker: Treating Taran Hable/Extender: Nicholaus Corolla in Treatment: 11 Visit Information History Since Last Visit Added or deleted any medications: No Patient Arrived: Wheel Chair Any new allergies or adverse reactions: No Arrival Time: 13:54 Had a fall or experienced change in No Accompanied By: family activities of daily living that may affect Transfer Assistance: None risk of falls: Patient Requires Transmission-Based Precautions: No Signs or symptoms of abuse/neglect since last visito No Patient Has Alerts: No Hospitalized since last visit: No Implantable device outside of the clinic excluding No cellular tissue based products placed in the center since last visit: Has Dressing in Place as Prescribed: Yes Pain Present Now: No Electronic Signature(s) Signed: 03/06/2022 4:02:28 PM By: Adline Peals Entered By: Adline Peals on 03/06/2022 14:00:16 -------------------------------------------------------------------------------- Encounter Discharge Information Details Patient Name: Date of Service: Stacy Like B. 03/06/2022 2:00 PM Medical Record Number: [id number] Patient Account Number: [id number] Date of Birth/Sex: Treating RN: 25-May-1923 (89 y.o. [name] Primary Care Delante Karapetyan: [name] Other Clinician: Referring Metzli Pollick: Treating Sheketa Ende/Extender: Nicholaus Corolla in Treatment: 11 Encounter Discharge Information Items Post Procedure Vitals Discharge Condition: Stable Temperature (F): 98.5 Ambulatory Status: Wheelchair Pulse (bpm):  77 Discharge Destination: Home Respiratory Rate (breaths/min): 18 Transportation: Private Auto Blood Pressure (mmHg): 133/66 Accompanied By: family Schedule Follow-up Appointment: Yes Clinical Summary of Care: Patient Declined Electronic Signature(s) Signed: 03/06/2022 4:02:28 PM By: Adline Peals Entered By: Adline Peals on 03/06/2022 14:55:56 -------------------------------------------------------------------------------- Lower Extremity Assessment Details Patient Name: Date of Service: [name], [name] 03/06/2022 2:00 PM Medical Record Number: [id number] Patient Account Number: [id number] Date of Birth/Sex: Treating RN: August 13, 1923 (88 y.o. [name] Primary Care Stefon Ramthun: [name] Other Clinician: Referring Dandrae Kustra: Treating Krishawn Vanderweele/Extender: Nicholaus Corolla in Treatment: 11 Electronic Signature(s) Signed: 03/06/2022 4:02:28 PM By: Adline Peals Entered By: Adline Peals on 03/06/2022 14:01:14 -------------------------------------------------------------------------------- Multi Wound Chart Details Patient Name: Date of Service: Stacy Like B. 03/06/2022 2:00 PM Medical Record Number: [id number] Patient Account Number: [id number] Date of Birth/Sex: Treating RN: Feb 20, 1923 (88 y.o. Elam Dutch Primary Care [name]: [name] Other Clinician: Referring Aurorah Schlachter: Treating Yulissa Needham/Extender: Nicholaus Corolla in Treatment: 11 Vital Signs Height(in): 62 Pulse(bpm): 60 Weight(lbs): 110 Blood Pressure(mmHg): 133/66 Body Mass Index(BMI): 20.1 Temperature(F): 98.5 Respiratory Rate(breaths/min): 18 Photos: [N/A:N/A] Thoracic spine N/A N/A Wound Location: Gradually Appeared N/A N/A Wounding Event: Pressure Ulcer N/A N/A Primary Etiology: Hypertension, Type II Diabetes N/A N/A Comorbid History: 12/19/2020 N/A N/A Date Acquired: 11 N/A N/A Weeks of Treatment: Open  N/A N/A Wound Status: No N/A N/A Wound Recurrence: 2.2x1.5x0.5 N/A N/A Measurements L x W x D (cm) 2.592 N/A N/A A (cm) : rea 1.296 N/A N/A Volume (cm) : 25.00% N/A N/A % Reduction in A rea: 75.00% N/A N/A % Reduction in Volume: 10 Starting Position 1 (o'clock): 3 Ending Position 1 (o'clock): 2.2 Maximum Distance 1 (cm): Yes N/A N/A Undermining: Category/Stage IV N/A N/A Classification: Medium N/A N/A Exudate A mount: Serosanguineous N/A N/A Exudate Type: red, brown N/A N/A Exudate Color: Well defined, not attached N/A N/A Wound Margin: Medium (34-66%) N/A N/A Granulation Amount: Red, Pink N/A N/A Granulation Quality: Small (1-33%) N/A N/A Necrotic Amount: Fat  Layer (Subcutaneous Tissue): Yes N/A N/A Exposed Structures: Fascia: No Tendon: No Muscle: No Joint: No Bone: No None N/A N/A Epithelialization: Debridement - Excisional N/A N/A Debridement: Pre-procedure Verification/Time Out 14:14 N/A N/A Taken: Other N/A N/A Pain Control: Subcutaneous, Slough N/A N/A Tissue Debrided: Skin/Subcutaneous Tissue N/A N/A Level: 3.3 N/A N/A Debridement A (sq cm): rea Curette N/A N/A Instrument: Minimum N/A N/A Bleeding: Pressure N/A N/A Hemostasis A chieved: 0 N/A N/A Procedural Pain: 0 N/A N/A Post Procedural Pain: Procedure was tolerated well N/A N/A Debridement Treatment Response: 2.2x1.5x0.5 N/A N/A Post Debridement Measurements L x W x D (cm) 1.296 N/A N/A Post Debridement Volume: (cm) Category/Stage IV N/A N/A Post Debridement Stage: Chemical Cauterization N/A N/A Procedures Performed: Debridement Treatment Notes Electronic Signature(s) Signed: 03/06/2022 2:54:39 PM By: Fredirick Maudlin MD FACS Signed: 03/12/2022 5:13:49 PM By: Baruch Gouty RN, BSN Entered By: Fredirick Maudlin on 03/06/2022 14:54:38 -------------------------------------------------------------------------------- Multi-Disciplinary Care Plan Details Patient  Name: Date of Service: Stacy Like B. 03/06/2022 2:00 PM Medical Record Number: [id number] Patient Account Number: [id number] Date of Birth/Sex: Treating RN: Jun 12, 1923 (88 y.o. [name] Primary Care Surya Folden: [name] Other Clinician: Referring Tali Coster: Treating Cagney Degrace/Extender: Nicholaus Corolla in Treatment: 11 Multidisciplinary Care Plan reviewed with physician Active Inactive Pressure Nursing Diagnoses: Knowledge deficit related to causes and risk factors for pressure ulcer development Knowledge deficit related to management of pressures ulcers Potential for impaired tissue integrity related to pressure, friction, moisture, and shear Goals: Patient will remain free of pressure ulcers Date Initiated: 12/25/2021 Target Resolution Date: 04/06/2022 Goal Status: Active Patient/caregiver will verbalize understanding of pressure ulcer management Date Initiated: 12/25/2021 Target Resolution Date: 04/06/2022 Goal Status: Active Interventions: Assess: immobility, friction, shearing, incontinence upon admission and as needed Assess offloading mechanisms upon admission and as needed Notes: Wound/Skin Impairment Nursing Diagnoses: Impaired tissue integrity Knowledge deficit related to ulceration/compromised skin integrity Goals: Patient will have a decrease in wound volume by X% from date: (specify in notes) Date Initiated: 12/20/2021 Date Inactivated: 01/08/2022 Target Resolution Date: 01/12/2022 Goal Status: Unmet Unmet Reason: pressure relief Patient/caregiver will verbalize understanding of skin care regimen Date Initiated: 12/20/2021 Target Resolution Date: 04/06/2022 Goal Status: Active Ulcer/skin breakdown will have a volume reduction of 30% by week 4 Date Initiated: 12/20/2021 Target Resolution Date: 04/06/2022 Goal Status: Active Ulcer/skin breakdown will have a volume reduction of 50% by week 8 Date Initiated: 12/20/2021 Target  Resolution Date: 04/06/2022 Goal Status: Active Interventions: Assess patient/caregiver ability to obtain necessary supplies Assess patient/caregiver ability to perform ulcer/skin care regimen upon admission and as needed Assess ulceration(s) every visit Notes: Electronic Signature(s) Signed: 03/06/2022 4:02:28 PM By: Adline Peals Entered By: Adline Peals on 03/06/2022 14:07:01 -------------------------------------------------------------------------------- Negative Pressure Wound Therapy Maintenance (NPWT) Details Patient Name: Date of Service[place], [name] 03/06/2022 2:00 PM Medical Record Number: [id number] Patient Account Number: [id number] Date of Birth/Sex: Treating RN: 05/25/1923 (88 y.o. [name] Primary Care Navya Timmons: [name] Other Clinician: Referring Latiqua Daloia: Treating Zella Dewan/Extender: Nicholaus Corolla in Treatment: 11 NPWT Maintenance Performed for: Wound #1 Thoracic spine Performed By: Adline Peals, RN Type: Other Coverage Size (sq cm): 3.3 Pressure Type: Constant Pressure Setting: 125 mmHG Drain Type: None Sponge/Dressing Type: Foam, Blue Date Initiated: 02/19/2022 Dressing Removed: No Canister Changed: No Dressing Reapplied: Yes Quantity of Sponges/Gauze Inserted: 1 Days On NPWT : 16 Post Procedure Diagnosis Same as Pre-procedure Electronic Signature(s) Signed: 03/06/2022 4:02:28 PM By: Adline Peals Entered By: Adline Peals on 03/06/2022 14:55:09 -------------------------------------------------------------------------------- Pain  Assessment Details Patient Name: Date of Service: [name], [name] 03/06/2022 2:00 PM Medical Record Number: [id number] Patient Account Number: [id number] Date of Birth/Sex: Treating RN: 03/06/1923 (88 y.o. [name] Primary Care Harol Shabazz: [name] Other Clinician: Referring Dawnyel Leven: Treating Tationa Stech/Extender: Nicholaus Corolla in Treatment: 11 Active Problems Location of Pain Severity and Description of Pain Patient Has Paino No Site Locations Rate the pain. Current Pain Level: 0 Pain Management and Medication Current Pain Management: Electronic Signature(s) Signed: 03/06/2022 4:02:28 PM By: Adline Peals Entered By: Adline Peals on 03/06/2022 14:01:10 -------------------------------------------------------------------------------- Patient/Caregiver Education Details Patient Name: Date of Service: [name] 5/30/2023andnbsp2:00 PM Medical Record Number: [id number] Patient Account Number: [id number] Date of Birth/Gender: Treating RN: [birth year] (88 y.o. [name] Primary Care Physician: [name] Other Clinician: Referring Physician: Treating Physician/Extender: Nicholaus Corolla in Treatment: 11 Education Assessment Education Provided To: Patient Education Topics Provided Wound/Skin Impairment: Methods: Explain/Verbal Responses: Reinforcements needed, State content correctly [employer]) Signed: 03/06/2022 4:02:28 PM By: Adline Peals Entered By: Adline Peals on 03/06/2022 14:07:12 -------------------------------------------------------------------------------- Wound Assessment Details Patient Name: Date of Service: [name], [name] 03/06/2022 2:00 PM Medical Record Number: [id number] Patient Account Number: [id number] Date of Birth/Sex: Treating RN: Sep 05, 1923 (88 y.o. [name] Primary Care Glennette Galster: [name] Other Clinician: Referring Kimbella Heisler: Treating Elber Galyean/Extender: Nicholaus Corolla in Treatment: 11 Wound Status Wound Number: 1 Primary Etiology: Pressure Ulcer Wound Location: Thoracic spine Wound Status: Open Wounding Event: Gradually Appeared Comorbid History: Hypertension, Type II Diabetes Date Acquired: 12/19/2020 Weeks Of  Treatment: 11 Clustered Wound: No Photos Wound Measurements Length: (cm) 2.2 Width: (cm) 1.5 Depth: (cm) 0.5 Area: (cm) 2.592 Volume: (cm) 1.296 % Reduction in Area: 25% % Reduction in Volume: 75% Epithelialization: None Tunneling: No Undermining: Yes Starting Position (o'clock): 10 Ending Position (o'clock): 3 Maximum Distance: (cm) 2.2 Wound Description Classification: Category/Stage IV Wound Margin: Well defined, not attached Exudate Amount: Medium Exudate Type: Serosanguineous Exudate Color: red, brown Foul Odor After Cleansing: No Slough/Fibrino Yes Wound Bed Granulation Amount: Medium (34-66%) Exposed Structure Granulation Quality: Red, Pink Fascia Exposed: No Necrotic Amount: Small (1-33%) Fat Layer (Subcutaneous Tissue) Exposed: Yes Necrotic Quality: Adherent Slough Tendon Exposed: No Muscle Exposed: No Joint Exposed: No Bone Exposed: No Treatment Notes Wound #1 (Thoracic spine) Cleanser Wound Cleanser Discharge Instruction: Cleanse the wound with wound cleanser prior to applying a clean dressing using gauze sponges, not tissue or cotton balls. Peri-Wound Care Skin Prep Discharge Instruction: Use skin prep as directed Topical Primary Dressing snap vac Secondary Dressing Secured With Compression Wrap Compression Stockings Add-Ons Electronic Signature(s) Signed: 03/06/2022 4:02:28 PM By: Adline Peals Entered By: Adline Peals on 03/06/2022 14:06:48 -------------------------------------------------------------------------------- [place] Details Patient Name: Date of Service: Stacy Like B. 03/06/2022 2:00 PM Medical Record Number: [id number] Patient Account Number: [id number] Date of Birth/Sex: Treating RN: 27-Jul-1923 (88 y.o. [name] Primary Care Teiara Baria: [name] Other Clinician: Referring Ellouise Mcwhirter: Treating Makiah Foye/Extender: Nicholaus Corolla in Treatment: 11 Vital Signs Time Taken:  14:00 Temperature (F): 98.5 Height (in): 62 Pulse (bpm): 77 Weight (lbs): 110 Respiratory Rate (breaths/min): 18 Body Mass Index (BMI): 20.1 Blood Pressure (mmHg): 133/66 Reference Range: 80 - 120 mg / dl Electronic Signature(s) Signed: 03/06/2022 4:02:28 PM By: Adline Peals Entered By: Adline Peals on 03/06/2022 14:00:47

## 2022-03-12 NOTE — Progress Notes (Signed)
[name], [name] ([id number]) Visit Report for 03/06/2022 Chief Complaint Document Details Patient Name: Date of Service: [name], [name] 03/06/2022 2:00 PM Medical Record Number: [id number] Patient Account Number: [id number] Date of Birth/Sex: Treating RN: May 08, 1923 (88 y.o. Elam Dutch Primary Care Provider: [name] Other Clinician: Referring Provider: Treating Provider/Extender: Nicholaus Corolla in Treatment: 11 Information Obtained from: Patient Chief Complaint Patient is at the clinic for treatment of an open pressure ulcer Electronic Signature(s) Signed: 03/06/2022 2:54:44 PM By: Fredirick Maudlin MD FACS Entered By: Fredirick Maudlin on 03/06/2022 14:54:43 -------------------------------------------------------------------------------- Debridement Details Patient Name: Date of Service: Stacy Like B. 03/06/2022 2:00 PM Medical Record Number: [id number] Patient Account Number: [id number] Date of Birth/Sex: Treating RN: [birth year] (88 y.o. Harlow Ohms Primary Care Provider: [name] Other Clinician: Referring Provider: Treating Provider/Extender: Nicholaus Corolla in Treatment: 11 Debridement Performed for Assessment: Wound #1 Thoracic spine Performed By: Physician Fredirick Maudlin, MD Debridement Type: Debridement Level of Consciousness (Pre-procedure): Awake and Alert Pre-procedure Verification/Time Out Yes - 14:14 Taken: Start Time: 14:14 Pain Control: Other : benzocaine 20% T Area Debrided (L x W): otal 2.2 (cm) x 1.5 (cm) = 3.3 (cm) Tissue and other material debrided: Viable, Non-Viable, Slough, Subcutaneous, Slough Level: Skin/Subcutaneous Tissue Debridement Description: Excisional Instrument: Curette Bleeding: Minimum Hemostasis Achieved: Pressure Procedural Pain: 0 Post Procedural Pain: 0 Response to Treatment: Procedure was tolerated well Level of Consciousness (Post- Awake and  Alert procedure): Post Debridement Measurements of Total Wound Length: (cm) 2.2 Stage: Category/Stage IV Width: (cm) 1.5 Depth: (cm) 0.5 Volume: (cm) 1.296 Character of Wound/Ulcer Post Debridement: Improved Post Procedure Diagnosis Same as Pre-procedure Electronic Signature(s) Signed: 03/06/2022 3:01:24 PM By: Fredirick Maudlin MD FACS Signed: 03/06/2022 4:02:28 PM By: Adline Peals Entered By: Adline Peals on 03/06/2022 14:15:26 -------------------------------------------------------------------------------- HPI Details Patient Name: Date of Service: Stacy Like B. 03/06/2022 2:00 PM Medical Record Number: [id number] Patient Account Number: [id number] Date of Birth/Sex: Treating RN: 06/23/1923 (88 y.o. Elam Dutch Primary Care Provider: [name] Other Clinician: Referring Provider: Treating Provider/Extender: Nicholaus Corolla in Treatment: 11 History of Present Illness HPI Description: ADMISSION 12/19/21 This is a 88 year old woman who is presenting to clinic today with a an open ulcer over her thoracic spine. She has severe kyphosis and a history of prior T12 fracture, as well as type 2 diabetes mellitus. The wound has been present for about a year. She is accompanied by her daughters, 1 of whom is a Marine scientist. They have been trying to offload the site by using pillows and supports to keep returned while in bed, and an eggcrate foam with a cut out for the wound for when she is sitting up. They have been applying Santyl to the site. She does have home health and the provider took a culture which was positive for Pseudomonas. She was on ciprofloxacin for this. A recent repeat culture was negative. Most recent hemoglobin A1c was 6.5, in February. A C-reactive protein also obtained in February was elevated at 24.1. No imaging has been performed of the site. The patient denies significant pain. 12/25/2021: Last week, I took a bone biopsy as  well as culture. Fortunately, the bone biopsy was negative for osteomyelitis. The culture returned with rare corynebacterium and rare Enterococcus faecalis. We have been using Santyl and Hydrofera Blue. T oday, the wound appears cleaner and there are buds of granulation tissue forming around the perimeter. 01/01/2022: Due to the positive culture, we switched to topical  gentamicin. Today, she has had a little bit of a slough accumulation. The wound continues to have buds of granulation tissue forming. Apparently, the Hydrofera Blue was placed just over the wound opening rather than down in to contact the surface. No purulent drainage or odor. 01/08/2022: Last week, there was more slough in the wound bed, so I changed back to Santyl. There is now hypertrophic granulation tissue around the wound opening. Once again, we seem to be having some difficulty getting the Hydrofera Blue tucked up into the wound; there is undermining that extends for 3 to 4 cm at the 12 o'clock position and the Hydrofera Blue is simply sitting on top of the wound without being in contact with the surface. Bone is still palpable, but tissue is beginning to close in over it. 01/22/2022: The wound looks much cleaner this week and the hypertrophic granulation tissue responded appropriately to silver nitrate application. She continues to have undermining for about 4 cm from 12:00 to 4:00. I can still feel the bone in the base at about the 2 o'clock position, but it is less prominent and tissue is closing over. We have been using Santyl and Hydrofera Blue. The daughter that accompanies the patient today indicates that they have been very diligent in making sure the [employer] is tucked under and into the full base of the wound. 02/05/2022: The wound overall is improved. The undermining is about the same but there is less bone palpable. We have been using Santyl and Hydrofera Blue. Minimal slough accumulation in the 2 weeks since her last  visit. 02/19/2022: The visible wound surface is very clean without any slough accumulation. Although I can feel bone in the undermined portion of the wound, it is now covered with a layer of tissue. The undermined area is about the same size, however. We have been using Santyl with Hydrofera Blue. She has been approved for snap VAC use. 02/26/2022: Last week, we applied a snap VAC. The wound dimensions have come in by a bit in all directions. The VAC canister did fill, however, and was starting to leak so the patient's daughter removed the VAC and dressed the wound as we had been previously. There is a little bit of thin slough on the wound surface with some senescent tissue at the inferior margin. Bone is still palpable but covered with a layer of tissue. No odor. 03/06/2022: We continue to have technical difficulties with the snap VAC which resulted in the patient's family removing it and going back to the dressing changes with Hydrofera Blue. It sounds like there are issues maintaining suction and the drape is getting bunched up creating a leak. The wound measured larger today, but the intake nurse thinks that perhaps her measurements were taken slightly differently than last week's. There is just some thin slough on the wound surface as well as some hypertrophic granulation tissue on the lateral border. Electronic Signature(s) Signed: 03/06/2022 2:56:10 PM By: Fredirick Maudlin MD FACS Entered By: Fredirick Maudlin on 03/06/2022 14:56:09 -------------------------------------------------------------------------------- Chemical Cauterization Details Patient Name: Date of Service: Stacy Like B. 03/06/2022 2:00 PM Medical Record Number: [id number] Patient Account Number: [id number] Date of Birth/Sex: Treating RN: Jun 11, 1923 (88 y.o. Harlow Ohms Primary Care Provider: [name] Other Clinician: Referring Provider: Treating Provider/Extender: Nicholaus Corolla  in Treatment: 11 Procedure Performed for: Wound #1 Thoracic spine Performed By: Physician Fredirick Maudlin, MD Post Procedure Diagnosis Same as Pre-procedure Electronic Signature(s) Signed: 03/06/2022 3:01:24 PM By: Fredirick Maudlin  MD FACS Signed: 03/06/2022 4:02:28 PM By: Sabas Sous By: Adline Peals on 03/06/2022 14:16:03 -------------------------------------------------------------------------------- Physical Exam Details Patient Name: Date of Service: [name], [name] 03/06/2022 2:00 PM Medical Record Number: [id number] Patient Account Number: [id number] Date of Birth/Sex: Treating RN: 1923/08/07 (88 y.o. Elam Dutch Primary Care Provider: [name] Other Clinician: Referring Provider: Treating Provider/Extender: Nicholaus Corolla in Treatment: 11 Constitutional . . . . No acute distress. Respiratory Normal work of breathing on room air. Notes 03/06/2022: The wound measured larger today, but the intake nurse thinks that perhaps her measurements were taken slightly differently than last week's. There is just some thin slough on the wound surface as well as some hypertrophic granulation tissue on the lateral border. Electronic Signature(s) Signed: 03/06/2022 2:56:50 PM By: Fredirick Maudlin MD FACS Entered By: Fredirick Maudlin on 03/06/2022 14:56:50 -------------------------------------------------------------------------------- Physician Orders Details Patient Name: Date of Service: Stacy Like B. 03/06/2022 2:00 PM Medical Record Number: [id number] Patient Account Number: [id number] Date of Birth/Sex: Treating RN: 08-Sep-1923 (88 y.o. Harlow Ohms Primary Care Provider: [name] Other Clinician: Referring Provider: Treating Provider/Extender: Nicholaus Corolla in Treatment: 11 Verbal / Phone Orders: No Diagnosis Coding ICD-10 Coding Code Description L89.104 Pressure ulcer of  unspecified part of back, stage 4 E11.622 Type 2 diabetes mellitus with other skin ulcer S22.089S Unspecified fracture of T11-T12 vertebra, sequela Follow-up Appointments ppointment in 1 week. - Dr Celine Ahr - room 1 - 6/6 at 2:00 PM Return A Bathing/ Shower/ Hygiene May shower with protection but do not get wound dressing(s) wet. Negative Presssure Wound Therapy SNAP Vac to wound continuously at 189m/hg pressure - SNAP VAC change weekly Blue Foam - 1 piece Off-Loading Turn and reposition every 2 hours Other: - KEEP PRESSURE OFF OF BACK MAY USE EGG CRATE CUSHION Home Health No change in wound care orders this week; continue Home Health for wound care. May utilize formulary equivalent dressing for wound treatment orders unless otherwise specified. Other Home Health Orders/Instructions: - [facility] Wound Treatment Wound #1 - Thoracic spine Cleanser: Wound Cleanser (Generic) 1 x Per Day/30 Days Discharge Instructions: Cleanse the wound with wound cleanser prior to applying a clean dressing using gauze sponges, not tissue or cotton balls. Peri-Wound Care: Skin Prep 1 x Per Day/30 Days Discharge Instructions: Use skin prep as directed Prim Dressing: snap vac ary 1 x Per Day/30 Days Electronic Signature(s) Signed: 03/06/2022 3:01:24 PM By: CFredirick MaudlinMD FACS Entered By: CFredirick Maudlinon 03/06/2022 14:57:03 -------------------------------------------------------------------------------- Problem List Details Patient Name: Date of Service: Stacy LikeB. 03/06/2022 2:00 PM Medical Record Number: [id number] Account Number: [id number] of Birth/Sex: Treating RN: 7November 17, 1924(88y.o. FElam DutchPrimary Care Provider: RJanace LittenOther Clinician: Referring Provider: Treating Provider/Extender: CNicholaus Corollain Treatment: 11 Active Problems ICD-10 Encounter Code Description Active Date MDM Diagnosis L89.104 Pressure ulcer of  unspecified part of back, stage 4 12/19/2021 No Yes E11.622 Type 2 diabetes mellitus with other skin ulcer 12/19/2021 No Yes S22.089S Unspecified fracture of T11-T12 vertebra, sequela 12/19/2021 No Yes Inactive Problems Resolved Problems Electronic Signature(s) Signed: 03/06/2022 2:54:30 PM By: CFredirick MaudlinMD FACS Entered By: CFredirick Maudlinon 03/06/2022 14:54:30 -------------------------------------------------------------------------------- Progress Note Details Patient Name: Date of Service: Stacy LikeB. 03/06/2022 2:00 PM Medical Record Number: [id number] Account Number: [id number] of Birth/Sex: Treating RN: [birth year](88y.o. FElam DutchPrimary Care Provider: RJanace LittenOther Clinician: Referring Provider: Treating Provider/Extender: CFredirick Maudlin  Gale Journey in Treatment: 11 Subjective Chief Complaint Information obtained from Patient Patient is at the clinic for treatment of an open pressure ulcer History of Present Illness (HPI) ADMISSION 12/19/21 This is a 88 year old woman who is presenting to clinic today with a an open ulcer over her thoracic spine. She has severe kyphosis and a history of prior T12 fracture, as well as type 2 diabetes mellitus. The wound has been present for about a year. She is accompanied by her daughters, 1 of whom is a Marine scientist. They have been trying to offload the site by using pillows and supports to keep returned while in bed, and an eggcrate foam with a cut out for the wound for when she is sitting up. They have been applying Santyl to the site. She does have home health and the provider took a culture which was positive for Pseudomonas. She was on ciprofloxacin for this. A recent repeat culture was negative. Most recent hemoglobin A1c was 6.5, in February. A C-reactive protein also obtained in February was elevated at 24.1. No imaging has been performed of the site. The patient denies significant  pain. 12/25/2021: Last week, I took a bone biopsy as well as culture. Fortunately, the bone biopsy was negative for osteomyelitis. The culture returned with rare corynebacterium and rare Enterococcus faecalis. We have been using Santyl and Hydrofera Blue. T oday, the wound appears cleaner and there are buds of granulation tissue forming around the perimeter. 01/01/2022: Due to the positive culture, we switched to topical gentamicin. Today, she has had a little bit of a slough accumulation. The wound continues to have buds of granulation tissue forming. Apparently, the Hydrofera Blue was placed just over the wound opening rather than down in to contact the surface. No purulent drainage or odor. 01/08/2022: Last week, there was more slough in the wound bed, so I changed back to Santyl. There is now hypertrophic granulation tissue around the wound opening. Once again, we seem to be having some difficulty getting the Hydrofera Blue tucked up into the wound; there is undermining that extends for 3 to 4 cm at the 12 o'clock position and the Hydrofera Blue is simply sitting on top of the wound without being in contact with the surface. Bone is still palpable, but tissue is beginning to close in over it. 01/22/2022: The wound looks much cleaner this week and the hypertrophic granulation tissue responded appropriately to silver nitrate application. She continues to have undermining for about 4 cm from 12:00 to 4:00. I can still feel the bone in the base at about the 2 o'clock position, but it is less prominent and tissue is closing over. We have been using Santyl and Hydrofera Blue. The daughter that accompanies the patient today indicates that they have been very diligent in making sure the [facility] is tucked under and into the full base of the wound. 02/05/2022: The wound overall is improved. The undermining is about the same but there is less bone palpable. We have been using Santyl and Hydrofera  Blue. Minimal slough accumulation in the 2 weeks since her last visit. 02/19/2022: The visible wound surface is very clean without any slough accumulation. Although I can feel bone in the undermined portion of the wound, it is now covered with a layer of tissue. The undermined area is about the same size, however. We have been using Santyl with Hydrofera Blue. She has been approved for snap VAC use. 02/26/2022: Last week, we applied a snap VAC. The  wound dimensions have come in by a bit in all directions. The VAC canister did fill, however, and was starting to leak so the patient's daughter removed the VAC and dressed the wound as we had been previously. There is a little bit of thin slough on the wound surface with some senescent tissue at the inferior margin. Bone is still palpable but covered with a layer of tissue. No odor. 03/06/2022: We continue to have technical difficulties with the snap VAC which resulted in the patient's family removing it and going back to the dressing changes with Hydrofera Blue. It sounds like there are issues maintaining suction and the drape is getting bunched up creating a leak. The wound measured larger today, but the intake nurse thinks that perhaps her measurements were taken slightly differently than last week's. There is just some thin slough on the wound surface as well as some hypertrophic granulation tissue on the lateral border. Patient History Family History Unknown History. Social History Never smoker, Marital Status - Married, Alcohol Use - Never, Drug Use - No History, Caffeine Use - Rarely. Medical History Ear/Nose/Mouth/Throat Denies history of Chronic sinus problems/congestion, Middle ear problems Cardiovascular Patient has history of Hypertension Endocrine Patient has history of Type II Diabetes Immunological Denies history of Lupus Erythematosus, Raynaudoos, Scleroderma Oncologic Denies history of Received Chemotherapy, Received  Radiation Hospitalization/Surgery History - t/11 t/12 FRACTURE/SURGERY. - FEMUR FX SURGERY. - TOTAL HYSTERECTOMY. - CHOLECYSTECTOMY. - TONSILLECTOMY. Medical A Surgical History Notes nd Ear/Nose/Mouth/Throat PARTIAL HEARING LEFT HEAR W/ HEARING AID TOTAL DEAFNESS RIGHT EAR Gastrointestinal Melena Endocrine Hypothyroidism Hyperlipidemia associated with type 2 diabetes mellitus (HCC) Musculoskeletal Closed T12 fracture (HCC) Hip fracture (HCC) Closed comminuted intertrochanteric fracture of proximal end of right femur (HCC) Objective Constitutional No acute distress. Vitals Time Taken: 2:00 PM, Height: 62 in, Weight: 110 lbs, BMI: 20.1, Temperature: 98.5 F, Pulse: 77 bpm, Respiratory Rate: 18 breaths/min, Blood Pressure: 133/66 mmHg. Respiratory Normal work of breathing on room air. General Notes: 03/06/2022: The wound measured larger today, but the intake nurse thinks that perhaps her measurements were taken slightly differently than last week's. There is just some thin slough on the wound surface as well as some hypertrophic granulation tissue on the lateral border. Integumentary (Hair, Skin) Wound #1 status is Open. Original cause of wound was Gradually Appeared. The date acquired was: 12/19/2020. The wound has been in treatment 11 weeks. The wound is located on the Thoracic spine. The wound measures 2.2cm length x 1.5cm width x 0.5cm depth; 2.592cm^2 area and 1.296cm^3 volume. There is Fat Layer (Subcutaneous Tissue) exposed. There is no tunneling noted, however, there is undermining starting at 10:00 and ending at 3:00 with a maximum distance of 2.2cm. There is a medium amount of serosanguineous drainage noted. The wound margin is well defined and not attached to the wound base. There is medium (34-66%) red, pink granulation within the wound bed. There is a small (1-33%) amount of necrotic tissue within the wound bed including Adherent Slough. Assessment Active  Problems ICD-10 Pressure ulcer of unspecified part of back, stage 4 Type 2 diabetes mellitus with other skin ulcer Unspecified fracture of T11-T12 vertebra, sequela Procedures Wound #1 Pre-procedure diagnosis of Wound #1 is a Pressure Ulcer located on the Thoracic spine . There was a Excisional Skin/Subcutaneous Tissue Debridement with a total area of 3.3 sq cm performed by Fredirick Maudlin, MD. With the following instrument(s): Curette to remove Viable and Non-Viable tissue/material. Material removed includes Subcutaneous Tissue and Slough and after achieving pain control  using Other (benzocaine 20%). No specimens were taken. A time out was conducted at 14:14, prior to the start of the procedure. A Minimum amount of bleeding was controlled with Pressure. The procedure was tolerated well with a pain level of 0 throughout and a pain level of 0 following the procedure. Post Debridement Measurements: 2.2cm length x 1.5cm width x 0.5cm depth; 1.296cm^3 volume. Post debridement Stage noted as Category/Stage IV. Character of Wound/Ulcer Post Debridement is improved. Post procedure Diagnosis Wound #1: Same as Pre-Procedure Pre-procedure diagnosis of Wound #1 is a Pressure Ulcer located on the Thoracic spine . An Chemical Cauterization procedure was performed by Fredirick Maudlin, MD. Post procedure Diagnosis Wound #1: Same as Pre-Procedure Plan Follow-up Appointments: Return Appointment in 1 week. - Dr Celine Ahr - room 1 - 6/6 at 2:00 PM Bathing/ Shower/ Hygiene: May shower with protection but do not get wound dressing(s) wet. Negative Presssure Wound Therapy: SNAP Vac to wound continuously at 149m/hg pressure - SNAP VAC change weekly Blue Foam - 1 piece Off-Loading: Turn and reposition every 2 hours Other: - KEEP PRESSURE OFF OF BACK MAY USE EGG CRATE CUSHION Home Health: No change in wound care orders this week; continue Home Health for wound care. May utilize formulary equivalent dressing for  wound treatment orders unless otherwise specified. Other Home Health Orders/Instructions: -[facility] LemmingsWOUND #1: - Thoracic spine Wound Laterality: Cleanser: Wound Cleanser (Generic) 1 x Per Day/30 Days Discharge Instructions: Cleanse the wound with wound cleanser prior to applying a clean dressing using gauze sponges, not tissue or cotton balls. Peri-Wound Care: Skin Prep 1 x Per Day/30 Days Discharge Instructions: Use skin prep as directed Prim Dressing: snap vac 1 x Per Day/30 Days ary 03/06/2022: The wound measured larger today, but the intake nurse thinks that perhaps her measurements were taken slightly differently than last week's. There is just some thin slough on the wound surface as well as some hypertrophic granulation tissue on the lateral border. Used a curette to debride the slough from the wound and silver nitrate to chemically cauterize the hypertrophic granulation tissue. We are going to give the snap VAC another week to see if we get any efficacy from it; I think our lack of progress has been primarily due to technical issues with the device itself. I also discussed nutrition with the patient and her family. She has been drinking 1 Ensure per day. I would like her to increase this to 2, as well as continue to try to get more protein in her diet from other sources. Follow-up in 1 week. Electronic Signature(s) Signed: 03/06/2022 2:58:03 PM By: CFredirick MaudlinMD FACS Entered By: CFredirick Maudlinon 03/06/2022 14:58:03 -------------------------------------------------------------------------------- HxROS Details Patient Name: Date of Service: Stacy LikeB. 03/06/2022 2:00 PM Medical Record Number: [id number] Account Number: [id number] of Birth/Sex: Treating RN: [birth year](88y.o. FElam DutchPrimary Care Provider: RJanace LittenOther Clinician: Referring Provider: Treating Provider/Extender: CNicholaus Corollain Treatment:  11 Ear/Nose/Mouth/Throat Medical History: Negative for: Chronic sinus problems/congestion; Middle ear problems Past Medical History Notes: PARTIAL HEARING LEFT HEAR W/ HEARING AID TOTAL DEAFNESS RIGHT EAR Cardiovascular Medical History: Positive for: Hypertension Gastrointestinal Medical History: Past Medical History Notes: Melena Endocrine Medical History: Positive for: Type II Diabetes Past Medical History Notes: Hypothyroidism Hyperlipidemia associated with type 2 diabetes mellitus ([place] Treated with: Diet Immunological Medical History: Negative for: Lupus Erythematosus; Raynauds; Scleroderma Musculoskeletal Medical History: Past Medical History Notes: Closed T12 fracture ([place] Hip fracture (HCC) Closed comminuted  intertrochanteric fracture of proximal end of right femur [facility]) Oncologic Medical History: Negative for: Received Chemotherapy; Received Radiation Immunizations Pneumococcal Vaccine: Received Pneumococcal Vaccination: Yes Received Pneumococcal Vaccination On or After 60th Birthday: No Implantable Devices None Hospitalization / Surgery History Type of Hospitalization/Surgery t/11 t/12 FRACTURE/SURGERY FEMUR FX SURGERY TOTAL HYSTERECTOMY CHOLECYSTECTOMY TONSILLECTOMY Family and Social History Unknown History: Yes; Never smoker; Marital Status - Married; Alcohol Use: Never; Drug Use: No History; Caffeine Use: Rarely; Financial Concerns: No; Food, Clothing or Shelter Needs: No; Support System Lacking: No; Transportation Concerns: No Engineer, maintenance) Signed: 03/06/2022 3:01:24 PM By: Fredirick Maudlin MD FACS Signed: 03/12/2022 5:13:49 PM By: Baruch Gouty RN, BSN Entered By: Fredirick Maudlin on 03/06/2022 14:56:15 -------------------------------------------------------------------------------- Murphy Details Patient Name: Date of Service: Marijean Heath 03/06/2022 Medical Record Number: [id number] Patient Account Number: [id number] Date of  Birth/Sex: Treating RN: 1922/12/25 (88 y.o. 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 Pressure ulcer of unspecified part of back, stage 4 Modifier: Quantity: 1 CPT4 Code: [id number] Description: 76546 - WOUND VAC-50 SQ CM OR LESS Modifier: Quantity: 1 CPT4 Code: [id number] Description: 17250 - CHEM CAUT GRANULATION TISS ICD-10 Diagnosis Description L89.104 Pressure ulcer of unspecified part of back, stage 4 Modifier: Quantity: 1 Physician Procedures : CPT4 Code Description Modifier [id number] 00174 - WC PHYS LEVEL 3 - EST PT 25 ICD-10 Diagnosis Description 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 Pressure ulcer of unspecified part of back, stage 4 S22.089S Unspecified fracture of T11-T12 vertebra, sequela E11.622 Type 2 diabetes  mellitus with other skin ulcer Quantity: 1 : [id number] 11042 - WC PHYS SUBQ TISS 20 SQ CM ICD-10 Diagnosis Description L89.104 Pressure ulcer of unspecified part of back, stage 4 Quantity: 1 : [id number] 65993 - WC PHYS CHEM CAUT GRAN TISSUE ICD-10 Diagnosis Description L89.104 Pressure ulcer of unspecified part of back, stage 4 Quantity: 1 Electronic Signature(s) Signed: 03/06/2022 2:58:35 PM By: Fredirick Maudlin MD FACS Entered By: Fredirick Maudlin on 03/06/2022 14:58:34

## 2022-03-13 ENCOUNTER — Encounter (HOSPITAL_BASED_OUTPATIENT_CLINIC_OR_DEPARTMENT_OTHER): Payer: Medicare Other | Attending: General Surgery | Admitting: General Surgery

## 2022-03-13 DIAGNOSIS — E11622 Type 2 diabetes mellitus with other skin ulcer: Secondary | ICD-10-CM | POA: Diagnosis not present

## 2022-03-13 DIAGNOSIS — M40204 Unspecified kyphosis, thoracic region: Secondary | ICD-10-CM | POA: Insufficient documentation

## 2022-03-13 DIAGNOSIS — L89104 Pressure ulcer of unspecified part of back, stage 4: Secondary | ICD-10-CM | POA: Diagnosis present

## 2022-03-14 NOTE — Progress Notes (Signed)
[name], [name] ([id number]) Visit Report for 03/13/2022 Chief Complaint Document Details Patient Name: Date of Service: [name], [name] 03/13/2022 2:00 PM Medical Record Number: [id number] Patient Account Number: [id number] Date of Birth/Sex: Treating RN: 28-Dec-1922 (88 y.o. Elam Dutch Primary Care Provider: [name] Other Clinician: Referring Provider: Treating Provider/Extender: Nicholaus Corolla in Treatment: 12 Information Obtained from: Patient Chief Complaint Patient is at the clinic for treatment of an open pressure ulcer Electronic Signature(s) Signed: 03/13/2022 2:50:32 PM By: Fredirick Maudlin MD FACS Entered By: Fredirick Maudlin on 03/13/2022 14:50:31 -------------------------------------------------------------------------------- Debridement Details Patient Name: Date of Service: Stacy Like B. 03/13/2022 2:00 PM Medical Record Number: [id number] Patient Account Number: [id number] Date of Birth/Sex: Treating RN: [birth year] (88 y.o. [name] Primary Care Provider: [name] Other Clinician: Referring Provider: Treating Provider/Extender: Nicholaus Corolla in Treatment: 12 Debridement Performed for Assessment: Wound #1 Thoracic spine Performed By: Physician Fredirick Maudlin, MD Debridement Type: Debridement Level of Consciousness (Pre-procedure): Awake and Alert Pre-procedure Verification/Time Out Yes - 14:47 Taken: Start Time: 14:47 T Area Debrided (L x W): otal 1.6 (cm) x 1.5 (cm) = 2.4 (cm) Tissue and other material debrided: Non-Viable, Subcutaneous Level: Skin/Subcutaneous Tissue Debridement Description: Excisional Instrument: Curette Bleeding: Minimum Hemostasis Achieved: Silver Nitrate End Time: 14:48 Procedural Pain: 0 Post Procedural Pain: 0 Response to Treatment: Procedure was tolerated well Level of Consciousness (Post- Awake and Alert procedure): Post Debridement Measurements of  Total Wound Length: (cm) 1.6 Stage: Category/Stage IV Width: (cm) 1.5 Depth: (cm) 1 Volume: (cm) 1.885 Character of Wound/Ulcer Post Debridement: Improved Post Procedure Diagnosis Same as Pre-procedure Electronic Signature(s) Signed: 03/13/2022 4:58:27 PM By: Fredirick Maudlin MD FACS Signed: 03/13/2022 5:33:26 PM By: Levan Hurst RN, BSN Entered By: Levan Hurst on 03/13/2022 14:49:19 -------------------------------------------------------------------------------- HPI Details Patient Name: Date of Service: Stacy Like B. 03/13/2022 2:00 PM Medical Record Number: [id number] Patient Account Number: [id number] Date of Birth/Sex: Treating RN: [birth year] (88 y.o. Elam Dutch Primary Care Provider: [name] Other Clinician: Referring Provider: Treating Provider/Extender: Nicholaus Corolla in Treatment: 12 History of Present Illness HPI Description: ADMISSION 12/19/21 This is a 88 year old woman who is presenting to clinic today with a an open ulcer over her thoracic spine. She has severe kyphosis and a history of prior T12 fracture, as well as type 2 diabetes mellitus. The wound has been present for about a year. She is accompanied by her daughters, 1 of whom is a Marine scientist. They have been trying to offload the site by using pillows and supports to keep returned while in bed, and an eggcrate foam with a cut out for the wound for when she is sitting up. They have been applying Santyl to the site. She does have home health and the provider took a culture which was positive for Pseudomonas. She was on ciprofloxacin for this. A recent repeat culture was negative. Most recent hemoglobin A1c was 6.5, in February. A C-reactive protein also obtained in February was elevated at 24.1. No imaging has been performed of the site. The patient denies significant pain. 12/25/2021: Last week, I took a bone biopsy as well as culture. Fortunately, the bone biopsy was negative  for osteomyelitis. The culture returned with rare corynebacterium and rare Enterococcus faecalis. We have been using Santyl and Hydrofera Blue. T oday, the wound appears cleaner and there are buds of granulation tissue forming around the perimeter. 01/01/2022: Due to the positive culture, we switched to topical gentamicin. Today, she  has had a little bit of a slough accumulation. The wound continues to have buds of granulation tissue forming. Apparently, the Hydrofera Blue was placed just over the wound opening rather than down in to contact the surface. No purulent drainage or odor. 01/08/2022: Last week, there was more slough in the wound bed, so I changed back to Santyl. There is now hypertrophic granulation tissue around the wound opening. Once again, we seem to be having some difficulty getting the Hydrofera Blue tucked up into the wound; there is undermining that extends for 3 to 4 cm at the 12 o'clock position and the Hydrofera Blue is simply sitting on top of the wound without being in contact with the surface. Bone is still palpable, but tissue is beginning to close in over it. 01/22/2022: The wound looks much cleaner this week and the hypertrophic granulation tissue responded appropriately to silver nitrate application. She continues to have undermining for about 4 cm from 12:00 to 4:00. I can still feel the bone in the base at about the 2 o'clock position, but it is less prominent and tissue is closing over. We have been using Santyl and Hydrofera Blue. The daughter that accompanies the patient today indicates that they have been very diligent in making sure the [facility] is tucked under and into the full base of the wound. 02/05/2022: The wound overall is improved. The undermining is about the same but there is less bone palpable. We have been using Santyl and Hydrofera Blue. Minimal slough accumulation in the 2 weeks since her last visit. 02/19/2022: The visible wound surface is very clean  without any slough accumulation. Although I can feel bone in the undermined portion of the wound, it is now covered with a layer of tissue. The undermined area is about the same size, however. We have been using Santyl with Hydrofera Blue. She has been approved for snap VAC use. 02/26/2022: Last week, we applied a snap VAC. The wound dimensions have come in by a bit in all directions. The VAC canister did fill, however, and was starting to leak so the patient's daughter removed the VAC and dressed the wound as we had been previously. There is a little bit of thin slough on the wound surface with some senescent tissue at the inferior margin. Bone is still palpable but covered with a layer of tissue. No odor. 03/06/2022: We continue to have technical difficulties with the snap VAC which resulted in the patient's family removing it and going back to the dressing changes with Hydrofera Blue. It sounds like there are issues maintaining suction and the drape is getting bunched up creating a leak. The wound measured larger today, but the intake nurse thinks that perhaps her measurements were taken slightly differently than last week's. There is just some thin slough on the wound surface as well as some hypertrophic granulation tissue on the lateral border. 03/13/2022: The snap VAC worked much better this week and only began leaking yesterday. At that point, the patient's family removed it and applied Hydrofera Blue to her wound. Today, the tissue coverage over the bone that had been exposed is more substantial. She still has undermining present. There is also some senescent skin around the wound orifice along with some hypertrophic granulation tissue. Electronic Signature(s) Signed: 03/13/2022 2:51:31 PM By: Fredirick Maudlin MD FACS Entered By: Fredirick Maudlin on 03/13/2022 14:51:31 -------------------------------------------------------------------------------- Physical Exam Details Patient Name: Date of  Service: Stacy Like B. 03/13/2022 2:00 PM Medical Record Number: [id number] Patient  Account Number: [id number] Date of Birth/Sex: Treating RN: 1922/11/04 (88 y.o. Elam Dutch Primary Care Provider: [name] Other Clinician: Referring Provider: Treating Provider/Extender: Nicholaus Corolla in Treatment: 12 Constitutional . . . . No acute distress.Marland Kitchen Respiratory Normal work of breathing on room air.. Notes 03/13/2022: Today, the tissue coverage over the bone that had been exposed is more substantial. She still has undermining present. There is also some senescent skin around the wound orifice along with some hypertrophic granulation tissue. Electronic Signature(s) Signed: 03/13/2022 2:52:21 PM By: Fredirick Maudlin MD FACS Entered By: Fredirick Maudlin on 03/13/2022 14:52:21 -------------------------------------------------------------------------------- Physician Orders Details Patient Name: Date of Service: Stacy Like B. 03/13/2022 2:00 PM Medical Record Number: [id number] Patient Account Number: [id number] Date of Birth/Sex: Treating RN: [birth year] (88 y.o. [name] Primary Care Provider: [name] Other Clinician: Referring Provider: Treating Provider/Extender: Nicholaus Corolla in Treatment: 12 Verbal / Phone Orders: No Diagnosis Coding ICD-10 Coding Code Description L89.104 Pressure ulcer of unspecified part of back, stage 4 E11.622 Type 2 diabetes mellitus with other skin ulcer S22.089S Unspecified fracture of T11-T12 vertebra, sequela Follow-up Appointments ppointment in 1 week. - Dr Celine Ahr - Room 1 Return A Tuesday 6/13 at 2:45 PM and Monday 6/19 at 2:00 pm Bathing/ Shower/ Hygiene May shower with protection but do not get wound dressing(s) wet. Negative Presssure Wound Therapy SNAP Vac to wound continuously at 150m/hg pressure - SNAP VAC change weekly Blue Foam Off-Loading Turn and reposition  every 2 hours Other: - KEEP PRESSURE OFF OF BACK MAY USE EGG CRATE CUSHION Wound Treatment Wound #1 - Thoracic spine Cleanser: Wound Cleanser (Generic) 1 x Per Day/30 Days Discharge Instructions: Cleanse the wound with wound cleanser prior to applying a clean dressing using gauze sponges, not tissue or cotton balls. Peri-Wound Care: Skin Prep 1 x Per Day/30 Days Discharge Instructions: Use skin prep as directed Prim Dressing: SNAP VAC ary 1 x Per Day/30 Days Electronic Signature(s) Signed: 03/13/2022 4:58:27 PM By: CFredirick MaudlinMD FACS Signed: 03/13/2022 5:33:26 PM By: LLevan HurstRN, BSN Previous Signature: 03/13/2022 2:52:29 PM Version By: CFredirick MaudlinMD FACS Entered By: LLevan Hurston 03/13/2022 14:54:09 -------------------------------------------------------------------------------- Problem List Details Patient Name: Date of Service: HJim LikeB. 03/13/2022 2:00 PM Medical Record Number: [id number] Account Number: [id number] of Birth/Sex: Treating RN: 712-Feb-1924(88y.o. FNancy FetterPrimary Care Provider: RJanace LittenOther Clinician: Referring Provider: Treating Provider/Extender: CNicholaus Corollain Treatment: 12 Active Problems ICD-10 Encounter Code Description Active Date MDM Diagnosis L89.104 Pressure ulcer of unspecified part of back, stage 4 12/19/2021 No Yes E11.622 Type 2 diabetes mellitus with other skin ulcer 12/19/2021 No Yes S22.089S Unspecified fracture of T11-T12 vertebra, sequela 12/19/2021 No Yes Inactive Problems Resolved Problems Electronic Signature(s) Signed: 03/13/2022 2:50:18 PM By: CFredirick MaudlinMD FACS Entered By: CFredirick Maudlinon 03/13/2022 14:50:18 -------------------------------------------------------------------------------- Progress Note Details Patient Name: Date of Service: HJim LikeB. 03/13/2022 2:00 PM Medical Record Number: [id number] Account Number:  [id number] of Birth/Sex: Treating RN: 702/25/1924(88y.o. FElam DutchPrimary Care Provider: RJanace LittenOther Clinician: Referring Provider: Treating Provider/Extender: CNicholaus Corollain Treatment: 12 Subjective Chief Complaint Information obtained from Patient Patient is at the clinic for treatment of an open pressure ulcer History of Present Illness (HPI) ADMISSION 12/19/21 This is a 88year old woman who is presenting to clinic today with a an open ulcer over her thoracic spine. She has severe  kyphosis and a history of prior T12 fracture, as well as type 2 diabetes mellitus. The wound has been present for about a year. She is accompanied by her daughters, 1 of whom is a Marine scientist. They have been trying to offload the site by using pillows and supports to keep returned while in bed, and an eggcrate foam with a cut out for the wound for when she is sitting up. They have been applying Santyl to the site. She does have home health and the provider took a culture which was positive for Pseudomonas. She was on ciprofloxacin for this. A recent repeat culture was negative. Most recent hemoglobin A1c was 6.5, in February. A C-reactive protein also obtained in February was elevated at 24.1. No imaging has been performed of the site. The patient denies significant pain. 12/25/2021: Last week, I took a bone biopsy as well as culture. Fortunately, the bone biopsy was negative for osteomyelitis. The culture returned with rare corynebacterium and rare Enterococcus faecalis. We have been using Santyl and Hydrofera Blue. T oday, the wound appears cleaner and there are buds of granulation tissue forming around the perimeter. 01/01/2022: Due to the positive culture, we switched to topical gentamicin. Today, she has had a little bit of a slough accumulation. The wound continues to have buds of granulation tissue forming. Apparently, the Hydrofera Blue was placed just over  the wound opening rather than down in to contact the surface. No purulent drainage or odor. 01/08/2022: Last week, there was more slough in the wound bed, so I changed back to Santyl. There is now hypertrophic granulation tissue around the wound opening. Once again, we seem to be having some difficulty getting the Hydrofera Blue tucked up into the wound; there is undermining that extends for 3 to 4 cm at the 12 o'clock position and the Hydrofera Blue is simply sitting on top of the wound without being in contact with the surface. Bone is still palpable, but tissue is beginning to close in over it. 01/22/2022: The wound looks much cleaner this week and the hypertrophic granulation tissue responded appropriately to silver nitrate application. She continues to have undermining for about 4 cm from 12:00 to 4:00. I can still feel the bone in the base at about the 2 o'clock position, but it is less prominent and tissue is closing over. We have been using Santyl and Hydrofera Blue. The daughter that accompanies the patient today indicates that they have been very diligent in making sure the [facility] is tucked under and into the full base of the wound. 02/05/2022: The wound overall is improved. The undermining is about the same but there is less bone palpable. We have been using Santyl and Hydrofera Blue. Minimal slough accumulation in the 2 weeks since her last visit. 02/19/2022: The visible wound surface is very clean without any slough accumulation. Although I can feel bone in the undermined portion of the wound, it is now covered with a layer of tissue. The undermined area is about the same size, however. We have been using Santyl with Hydrofera Blue. She has been approved for snap VAC use. 02/26/2022: Last week, we applied a snap VAC. The wound dimensions have come in by a bit in all directions. The VAC canister did fill, however, and was starting to leak so the patient's daughter removed the VAC and  dressed the wound as we had been previously. There is a little bit of thin slough on the wound surface with some senescent tissue at  the inferior margin. Bone is still palpable but covered with a layer of tissue. No odor. 03/06/2022: We continue to have technical difficulties with the snap VAC which resulted in the patient's family removing it and going back to the dressing changes with Hydrofera Blue. It sounds like there are issues maintaining suction and the drape is getting bunched up creating a leak. The wound measured larger today, but the intake nurse thinks that perhaps her measurements were taken slightly differently than last week's. There is just some thin slough on the wound surface as well as some hypertrophic granulation tissue on the lateral border. 03/13/2022: The snap VAC worked much better this week and only began leaking yesterday. At that point, the patient's family removed it and applied Hydrofera Blue to her wound. Today, the tissue coverage over the bone that had been exposed is more substantial. She still has undermining present. There is also some senescent skin around the wound orifice along with some hypertrophic granulation tissue. Patient History Family History Unknown History. Social History Never smoker, Marital Status - Married, Alcohol Use - Never, Drug Use - No History, Caffeine Use - Rarely. Medical History Ear/Nose/Mouth/Throat Denies history of Chronic sinus problems/congestion, Middle ear problems Cardiovascular Patient has history of Hypertension Endocrine Patient has history of Type II Diabetes Immunological Denies history of Lupus Erythematosus, Raynaudoos, Scleroderma Oncologic Denies history of Received Chemotherapy, Received Radiation Hospitalization/Surgery History - t/11 t/12 FRACTURE/SURGERY. - FEMUR FX SURGERY. - TOTAL HYSTERECTOMY. - CHOLECYSTECTOMY. - TONSILLECTOMY. Medical A Surgical History Notes nd Ear/Nose/Mouth/Throat PARTIAL HEARING  LEFT HEAR W/ HEARING AID TOTAL DEAFNESS RIGHT EAR Gastrointestinal Melena Endocrine Hypothyroidism Hyperlipidemia associated with type 2 diabetes mellitus (HCC) Musculoskeletal Closed T12 fracture (HCC) Hip fracture (HCC) Closed comminuted intertrochanteric fracture of proximal end of right femur (HCC) Objective Constitutional No acute distress.. Vitals Time Taken: 2:32 PM, Height: 62 in, Weight: 110 lbs, BMI: 20.1, Temperature: 98.0 F, Pulse: 84 bpm, Respiratory Rate: 16 breaths/min, Blood Pressure: 121/65 mmHg. Respiratory Normal work of breathing on room air.. General Notes: 03/13/2022: Today, the tissue coverage over the bone that had been exposed is more substantial. She still has undermining present. There is also some senescent skin around the wound orifice along with some hypertrophic granulation tissue. Integumentary (Hair, Skin) Wound #1 status is Open. Original cause of wound was Gradually Appeared. The date acquired was: 12/19/2020. The wound has been in treatment 12 weeks. The wound is located on the Thoracic spine. The wound measures 1.6cm length x 1.5cm width x 1cm depth; 1.885cm^2 area and 1.885cm^3 volume. There is Fat Layer (Subcutaneous Tissue) exposed. There is no tunneling noted, however, there is undermining starting at 10:00 and ending at 3:00 with a maximum distance of 3.4cm. There is a medium amount of serosanguineous drainage noted. The wound margin is well defined and not attached to the wound base. There is large (67-100%) pink granulation within the wound bed. There is a small (1-33%) amount of necrotic tissue within the wound bed including Adherent Slough. Assessment Active Problems ICD-10 Pressure ulcer of unspecified part of back, stage 4 Type 2 diabetes mellitus with other skin ulcer Unspecified fracture of T11-T12 vertebra, sequela Procedures Wound #1 Pre-procedure diagnosis of Wound #1 is a Pressure Ulcer located on the Thoracic spine . There was a  Excisional Skin/Subcutaneous Tissue Debridement with a total area of 2.4 sq cm performed by Fredirick Maudlin, MD. With the following instrument(s): Curette to remove Non-Viable tissue/material. Material removed includes Subcutaneous Tissue. No specimens were taken. A time out was  conducted at 14:47, prior to the start of the procedure. A Minimum amount of bleeding was controlled with Silver Nitrate. The procedure was tolerated well with a pain level of 0 throughout and a pain level of 0 following the procedure. Post Debridement Measurements: 1.6cm length x 1.5cm width x 1cm depth; 1.885cm^3 volume. Post debridement Stage noted as Category/Stage IV. Character of Wound/Ulcer Post Debridement is improved. Post procedure Diagnosis Wound #1: Same as Pre-Procedure Plan 03/13/2022: Today, the tissue coverage over the bone that had been exposed is more substantial. She still has undermining present. There is also some senescent skin around the wound orifice along with some hypertrophic granulation tissue. I used a curette to debride the senescent skin from the wound orifice. I then used silver nitrate to chemically cauterize the hypertrophic granulation tissue. We seem to be getting good results from the snap VAC and so we will continue with this therapy. Follow-up in 1 week. Electronic Signature(s) Signed: 03/13/2022 2:53:02 PM By: Fredirick Maudlin MD FACS Entered By: Fredirick Maudlin on 03/13/2022 14:53:02 -------------------------------------------------------------------------------- HxROS Details Patient Name: Date of Service: Stacy Like B. 03/13/2022 2:00 PM Medical Record Number: [id number] Patient Account Number: [id number] Date of Birth/Sex: Treating RN: 03-08-1923 (88 y.o. Elam Dutch Primary Care Provider: [name] Other Clinician: Referring Provider: Treating Provider/Extender: Nicholaus Corolla in Treatment: 12 Ear/Nose/Mouth/Throat Medical  History: Negative for: Chronic sinus problems/congestion; Middle ear problems Past Medical History Notes: PARTIAL HEARING LEFT HEAR W/ HEARING AID TOTAL DEAFNESS RIGHT EAR Cardiovascular Medical History: Positive for: Hypertension Gastrointestinal Medical History: Past Medical History Notes: Melena Endocrine Medical History: Positive for: Type II Diabetes Past Medical History Notes: Hypothyroidism Hyperlipidemia associated with type 2 diabetes mellitus ([place]) Treated with: Diet Immunological Medical History: Negative for: Lupus Erythematosus; Raynauds; Scleroderma Musculoskeletal Medical History: Past Medical History Notes: Closed T12 fracture ([place]) Hip fracture ([place]) Closed comminuted intertrochanteric fracture of proximal end of right femur [facility]) Oncologic Medical History: Negative for: Received Chemotherapy; Received Radiation Immunizations Pneumococcal Vaccine: Received Pneumococcal Vaccination: Yes Received Pneumococcal Vaccination On or After 60th Birthday: No Implantable Devices None Hospitalization / Surgery History Type of Hospitalization/Surgery t/11 t/12 FRACTURE/SURGERY FEMUR FX SURGERY TOTAL HYSTERECTOMY CHOLECYSTECTOMY TONSILLECTOMY Family and Social History Unknown History: Yes; Never smoker; Marital Status - Married; Alcohol Use: Never; Drug Use: No History; Caffeine Use: Rarely; Financial Concerns: No; Food, Clothing or Shelter Needs: No; Support System Lacking: No; Transportation Concerns: No Engineer, maintenance) Signed: 03/13/2022 4:58:27 PM By: Fredirick Maudlin MD FACS Signed: 03/14/2022 5:06:14 PM By: Baruch Gouty RN, BSN Entered By: Fredirick Maudlin on 03/13/2022 14:51:39 -------------------------------------------------------------------------------- [place] Details Patient Name: Date of Service: [name] 03/13/2022 Medical Record Number: [id number] Patient Account Number: [id number] Date of Birth/Sex: Treating RN: 1923/09/16  (88 y.o. Elam Dutch Primary Care Provider: [name] Other Clinician: Referring Provider: Treating Provider/Extender: Nicholaus Corolla in Treatment: 12 Diagnosis Coding ICD-10 Codes Code Description L89.104 Pressure ulcer of unspecified part of back, stage 4 E11.622 Type 2 diabetes mellitus with other skin ulcer S22.089S Unspecified fracture of T11-T12 vertebra, sequela Facility Procedures CPT4 Code: [id number] Description: 02637 - DEB SUBQ TISSUE 20 SQ CM/< ICD-10 Diagnosis Description L89.104 Pressure ulcer of unspecified part of back, stage 4 Modifier: Quantity: 1 CPT4 Code: [id number] Description: 74128 NEG PRESS WND TX <=50 SQ CM Modifier: Quantity: 1 CPT4 Code: [id number] Description: 94709 - CHEM CAUT GRANULATION TISS ICD-10 Diagnosis Description L89.104 Pressure ulcer of unspecified part of back, stage 4 Modifier: Quantity: 1 Physician Procedures :  CPT4 Code Description Modifier [id number] - WC PHYS LEVEL 3 - EST PT 25 ICD-10 Diagnosis Description L89.104 Pressure ulcer of unspecified part of back, stage 4 E11.622 Type 2 diabetes mellitus with other skin ulcer S22.089S Unspecified fracture of  T11-T12 vertebra, sequela Quantity: 1 : [id number] 11042 - WC PHYS SUBQ TISS 20 SQ CM ICD-10 Diagnosis Description 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

## 2022-03-14 NOTE — Progress Notes (Signed)
ELLENIE, SALOME ([id number]) Visit Report for 03/13/2022 Arrival Information Details Patient Name: Date of Service: [name], [name] 03/13/2022 2:00 PM Medical Record Number: [id number] Patient Account Number: [id number] Date of Birth/Sex: Treating RN: 1923-08-20 (88 y.o. [name] Primary Care Luana Tatro: [name] Other Clinician: Referring Tremaine Earwood: Treating Izaiha Lo/Extender: Nicholaus Corolla in Treatment: 12 Visit Information History Since Last Visit Added or deleted any medications: No Patient Arrived: Wheel Chair Any new allergies or adverse reactions: No Arrival Time: 14:32 Had a fall or experienced change in No Accompanied By: daughter activities of daily living that may affect Transfer Assistance: Manual risk of falls: Patient Identification Verified: Yes Signs or symptoms of abuse/neglect since last visito No Secondary Verification Process Completed: Yes Hospitalized since last visit: No Patient Requires Transmission-Based Precautions: No Implantable device outside of the clinic excluding No Patient Has Alerts: No cellular tissue based products placed in the center since last visit: Has Dressing in Place as Prescribed: Yes Pain Present Now: No Electronic Signature(s) Signed: 03/13/2022 5:33:26 PM By: Levan Hurst RN, BSN Entered By: Levan Hurst on 03/13/2022 14:32:31 -------------------------------------------------------------------------------- Encounter Discharge Information Details Patient Name: Date of Service: Stacy Like B. 03/13/2022 2:00 PM Medical Record Number: [id number] Patient Account Number: [id number] Date of Birth/Sex: Treating RN: 1923-07-04 (88 y.o. [name] Primary Care Semaja Lymon: [name] Other Clinician: Referring Aulani Shipton: Treating Taj Arteaga/Extender: Nicholaus Corolla in Treatment: 12 Encounter Discharge Information Items Post Procedure Vitals Discharge Condition:  Stable Temperature (F): 98.0 Ambulatory Status: Wheelchair Pulse (bpm): 84 Discharge Destination: Home Respiratory Rate (breaths/min): 16 Transportation: Private Auto Blood Pressure (mmHg): 121/65 Accompanied By: daughter Schedule Follow-up Appointment: Yes Clinical Summary of Care: Patient Declined Electronic Signature(s) Signed: 03/13/2022 5:33:26 PM By: Levan Hurst RN, BSN Entered By: Levan Hurst on 03/13/2022 16:49:55 -------------------------------------------------------------------------------- Multi Wound Chart Details Patient Name: Date of Service: Stacy Like B. 03/13/2022 2:00 PM Medical Record Number: [id number] Patient Account Number: [id number] Date of Birth/Sex: Treating RN: 07/11/1923 (88 y.o. Elam Dutch Primary Care Lorie Cleckley: [name] Other Clinician: Referring Maycel Riffe: Treating Ziaire Bieser/Extender: Nicholaus Corolla in Treatment: 12 Vital Signs Height(in): 62 Pulse(bpm): 3 Weight(lbs): 110 Blood Pressure(mmHg): 121/65 Body Mass Index(BMI): 20.1 Temperature(F): 98.0 Respiratory Rate(breaths/min): 16 Photos: [N/A:N/A] Thoracic spine N/A N/A Wound Location: Gradually Appeared N/A N/A Wounding Event: Pressure Ulcer N/A N/A Primary Etiology: Hypertension, Type II Diabetes N/A N/A Comorbid History: 12/19/2020 N/A N/A Date Acquired: 12 N/A N/A Weeks of Treatment: Open N/A N/A Wound Status: No N/A N/A Wound Recurrence: 1.6x1.5x1 N/A N/A Measurements L x W x D (cm) 1.885 N/A N/A A (cm) : rea 1.885 N/A N/A Volume (cm) : 45.50% N/A N/A % Reduction in A rea: 63.60% N/A N/A % Reduction in Volume: 10 Starting Position 1 (o'clock): 3 Ending Position 1 (o'clock): 3.4 Maximum Distance 1 (cm): Yes N/A N/A Undermining: Category/Stage IV N/A N/A Classification: Medium N/A N/A Exudate A mount: Serosanguineous N/A N/A Exudate Type: red, brown N/A N/A Exudate Color: Well defined, not attached N/A  N/A Wound Margin: Large (67-100%) N/A N/A Granulation A mount: Pink N/A N/A Granulation Quality: Small (1-33%) N/A N/A Necrotic A mount: Fat Layer (Subcutaneous Tissue): Yes N/A N/A Exposed Structures: Fascia: No Tendon: No Muscle: No Joint: No Bone: No Small (1-33%) N/A N/A Epithelialization: Debridement - Excisional N/A N/A Debridement: Pre-procedure Verification/Time Out 14:47 N/A N/A Taken: Subcutaneous N/A N/A Tissue Debrided: Skin/Subcutaneous Tissue N/A N/A Level: 2.4 N/A N/A Debridement A (sq cm): rea Curette N/A N/A  Instrument: Minimum N/A N/A Bleeding: Silver Nitrate N/A N/A Hemostasis A chieved: 0 N/A N/A Procedural Pain: 0 N/A N/A Post Procedural Pain: Procedure was tolerated well N/A N/A Debridement Treatment Response: 1.6x1.5x1 N/A N/A Post Debridement Measurements L x W x D (cm) 1.885 N/A N/A Post Debridement Volume: (cm) Category/Stage IV N/A N/A Post Debridement Stage: Debridement N/A N/A Procedures Performed: Negative Pressure Wound Therapy Maintenance (NPWT) Treatment Notes Electronic Signature(s) Signed: 03/13/2022 2:50:25 PM By: Fredirick Maudlin MD FACS Signed: 03/14/2022 5:06:14 PM By: Baruch Gouty RN, BSN Entered By: Fredirick Maudlin on 03/13/2022 14:50:25 -------------------------------------------------------------------------------- Multi-Disciplinary Care Plan Details Patient Name: Date of Service: [name], Stacy Cola B. 03/13/2022 2:00 PM Medical Record Number: [id number] Patient Account Number: [id number] Date of Birth/Sex: Treating RN: Apr 21, 1923 (88 y.o. [name] Primary Care Majorie Santee: [name] Other Clinician: Referring Myleah Cavendish: Treating Yenesis Even/Extender: Nicholaus Corolla in Treatment: 12 Multidisciplinary Care Plan reviewed with physician Active Inactive Pressure Nursing Diagnoses: Knowledge deficit related to causes and risk factors for pressure ulcer development Knowledge  deficit related to management of pressures ulcers Potential for impaired tissue integrity related to pressure, friction, moisture, and shear Goals: Patient will remain free of pressure ulcers Date Initiated: 12/25/2021 Target Resolution Date: 04/06/2022 Goal Status: Active Patient/caregiver will verbalize understanding of pressure ulcer management Date Initiated: 12/25/2021 Target Resolution Date: 04/06/2022 Goal Status: Active Interventions: Assess: immobility, friction, shearing, incontinence upon admission and as needed Assess offloading mechanisms upon admission and as needed Notes: Wound/Skin Impairment Nursing Diagnoses: Impaired tissue integrity Knowledge deficit related to ulceration/compromised skin integrity Goals: Patient will have a decrease in wound volume by X% from date: (specify in notes) Date Initiated: 12/20/2021 Date Inactivated: 01/08/2022 Target Resolution Date: 01/12/2022 Goal Status: Unmet Unmet Reason: pressure relief Patient/caregiver will verbalize understanding of skin care regimen Date Initiated: 12/20/2021 Target Resolution Date: 04/06/2022 Goal Status: Active Ulcer/skin breakdown will have a volume reduction of 30% by week 4 Date Initiated: 12/20/2021 Target Resolution Date: 04/06/2022 Goal Status: Active Ulcer/skin breakdown will have a volume reduction of 50% by week 8 Date Initiated: 12/20/2021 Target Resolution Date: 04/06/2022 Goal Status: Active Interventions: Assess patient/caregiver ability to obtain necessary supplies Assess patient/caregiver ability to perform ulcer/skin care regimen upon admission and as needed Assess ulceration(s) every visit Notes: Electronic Signature(s) Signed: 03/13/2022 5:33:26 PM By: Levan Hurst RN, BSN Entered By: Levan Hurst on 03/13/2022 14:47:03 -------------------------------------------------------------------------------- Negative Pressure Wound Therapy Maintenance (NPWT) Details Patient Name: Date of  Service: [name], [name] 03/13/2022 2:00 PM Medical Record Number: [id number] Patient Account Number: [id number] Date of Birth/Sex: Treating RN: April 21, 1923 (88 y.o. [name] Primary Care Issacc Merlo: [name] Other Clinician: Referring Shylo Zamor: Treating Quinnie Barcelo/Extender: Nicholaus Corolla in Treatment: 12 NPWT Maintenance Performed for: Wound #1 Thoracic spine Performed By: Levan Hurst, RN Type: Other Coverage Size (sq cm): 2.4 Pressure Type: Constant Pressure Setting: 125 mmHG Drain Type: None Sponge/Dressing Type: Foam, Blue Date Initiated: 02/19/2022 Dressing Removed: Yes Quantity of Sponges/Gauze Removed: 1 piece blue foam Canister Changed: No Dressing Reapplied: Yes Quantity of Sponges/Gauze Inserted: 1 piece blue foam Respones T Treatment: o pt tolerated well Days On NPWT : 23 Post Procedure Diagnosis Same as Pre-procedure Electronic Signature(s) Signed: 03/13/2022 5:33:26 PM By: Levan Hurst RN, BSN Entered By: Levan Hurst on 03/13/2022 14:50:04 -------------------------------------------------------------------------------- Pain Assessment Details Patient Name: Date of Service: Stacy Like B. 03/13/2022 2:00 PM Medical Record Number: [id number] Patient Account Number: [id number] Date of Birth/Sex: Treating RN: [birth year] (88 y.o. [name] Primary Care  Janyce Ellinger: [name] Other Clinician: Referring Brylee Berk: Treating Trynity Skousen/Extender: Nicholaus Corolla in Treatment: 12 Active Problems Location of Pain Severity and Description of Pain Patient Has Paino No Site Locations Pain Management and Medication Current Pain Management: Electronic Signature(s) Signed: 03/13/2022 5:33:26 PM By: Levan Hurst RN, BSN Entered By: Levan Hurst on 03/13/2022 14:32:48 -------------------------------------------------------------------------------- Patient/Caregiver Education Details Patient  Name: Date of Service: [name] 6/6/2023andnbsp2:00 PM Medical Record Number: [id number] Patient Account Number: [id number] Date of Birth/Gender: Treating RN: 1922-12-29 (88 y.o. [name] Primary Care Physician: [name] Other Clinician: Referring Physician: Treating Physician/Extender: Nicholaus Corolla in Treatment: 12 Education Assessment Education Provided To: Patient Education Topics Provided Wound/Skin Impairment: Methods: Explain/Verbal Responses: State content correctly [employer]) Signed: 03/13/2022 5:33:26 PM By: Levan Hurst RN, BSN Entered By: Levan Hurst on 03/13/2022 14:47:14 -------------------------------------------------------------------------------- Wound Assessment Details Patient Name: Date of Service: Stacy Like B. 03/13/2022 2:00 PM Medical Record Number: [id number] Patient Account Number: [id number] Date of Birth/Sex: Treating RN: 1923/07/18 (88 y.o. [name] Primary Care Elyan Vanwieren: [name] Other Clinician: Referring Jesiel Garate: Treating Elif Yonts/Extender: Nicholaus Corolla in Treatment: 12 Wound Status Wound Number: 1 Primary Etiology: Pressure Ulcer Wound Location: Thoracic spine Wound Status: Open Wounding Event: Gradually Appeared Comorbid History: Hypertension, Type II Diabetes Date Acquired: 12/19/2020 Weeks Of Treatment: 12 Clustered Wound: No Photos Wound Measurements Length: (cm) 1.6 Width: (cm) 1.5 Depth: (cm) 1 Area: (cm) 1.885 Volume: (cm) 1.885 % Reduction in Area: 45.5% % Reduction in Volume: 63.6% Epithelialization: Small (1-33%) Tunneling: No Undermining: Yes Starting Position (o'clock): 10 Ending Position (o'clock): 3 Maximum Distance: (cm) 3.4 Wound Description Classification: Category/Stage IV Wound Margin: Well defined, not attached Exudate Amount: Medium Exudate Type: Serosanguineous Exudate Color: red,  brown Foul Odor After Cleansing: No Slough/Fibrino Yes Wound Bed Granulation Amount: Large (67-100%) Exposed Structure Granulation Quality: Pink Fascia Exposed: No Necrotic Amount: Small (1-33%) Fat Layer (Subcutaneous Tissue) Exposed: Yes Necrotic Quality: Adherent Slough Tendon Exposed: No Muscle Exposed: No Joint Exposed: No Bone Exposed: No Treatment Notes Wound #1 (Thoracic spine) Cleanser Wound Cleanser Discharge Instruction: Cleanse the wound with wound cleanser prior to applying a clean dressing using gauze sponges, not tissue or cotton balls. Peri-Wound Care Skin Prep Discharge Instruction: Use skin prep as directed Topical Primary Dressing SNAP VAC Secondary Dressing Secured With Compression Wrap Compression Stockings Add-Ons Electronic Signature(s) Signed: 03/13/2022 5:33:26 PM By: Levan Hurst RN, BSN Entered By: Levan Hurst on 03/13/2022 14:40:59 -------------------------------------------------------------------------------- [place] Details Patient Name: Date of Service: Stacy Like B. 03/13/2022 2:00 PM Medical Record Number: [id number] Patient Account Number: [id number] Date of Birth/Sex: Treating RN: 1923/07/26 (88 y.o. [name] Primary Care Dimitry Holsworth: [name] Other Clinician: Referring Bindu Docter: Treating Aryan Bello/Extender: Nicholaus Corolla in Treatment: 12 Vital Signs Time Taken: 14:32 Temperature (F): 98.0 Height (in): 62 Pulse (bpm): 84 Weight (lbs): 110 Respiratory Rate (breaths/min): 16 Body Mass Index (BMI): 20.1 Blood Pressure (mmHg): 121/65 Reference Range: 80 - 120 mg / dl Electronic Signature(s) Signed: 03/13/2022 5:33:26 PM By: Levan Hurst RN, BSN Entered By: Levan Hurst on 03/13/2022 14:32:45

## 2022-03-20 ENCOUNTER — Encounter (HOSPITAL_BASED_OUTPATIENT_CLINIC_OR_DEPARTMENT_OTHER): Payer: Medicare Other | Admitting: General Surgery

## 2022-03-20 DIAGNOSIS — L89104 Pressure ulcer of unspecified part of back, stage 4: Secondary | ICD-10-CM | POA: Diagnosis not present

## 2022-03-21 NOTE — Progress Notes (Signed)
[name], Perry ([id number]) Visit Report for 03/20/2022 Chief Complaint Document Details Patient Name: Date of Service: [name], [name] 03/20/2022 2:45 PM Medical Record Number: [id number] Patient Account Number: [id number] Date of Birth/Sex: Treating RN: 1923/06/12 (88 y.o. [name] Primary Care Provider: [name] Other Clinician: Referring Provider: Treating Provider/Extender: Nicholaus Corolla in Treatment: 13 Information Obtained from: Patient Chief Complaint Patient is at the clinic for treatment of an open pressure ulcer Electronic Signature(s) Signed: 03/20/2022 3:37:59 PM By: Fredirick Maudlin MD FACS Entered By: Fredirick Maudlin on 03/20/2022 15:37:59 -------------------------------------------------------------------------------- Debridement Details Patient Name: Date of Service: Stacy Like B. 03/20/2022 2:45 PM Medical Record Number: [id number] Patient Account Number: [id number] Date of Birth/Sex: Treating RN: 10/29/1922 (88 y.o. [name] Primary Care Provider: [name] Other Clinician: Referring Provider: Treating Provider/Extender: Nicholaus Corolla in Treatment: 13 Debridement Performed for Assessment: Wound #1 Thoracic spine Performed By: Physician Fredirick Maudlin, MD Debridement Type: Debridement Level of Consciousness (Pre-procedure): Awake and Alert Pre-procedure Verification/Time Out Yes - 15:29 Taken: Start Time: 15:29 T Area Debrided (L x W): otal 1.7 (cm) x 1.5 (cm) = 2.55 (cm) Tissue and other material debrided: Non-Viable, Slough, Biofilm, Slough Level: Non-Viable Tissue Debridement Description: Selective/Open Wound Instrument: Curette Bleeding: Minimum Hemostasis Achieved: Pressure End Time: 15:30 Procedural Pain: 0 Post Procedural Pain: 0 Response to Treatment: Procedure was tolerated well Level of Consciousness (Post- Awake and Alert procedure): Post Debridement  Measurements of Total Wound Length: (cm) 1.7 Stage: Category/Stage IV Width: (cm) 1.5 Depth: (cm) 1.2 Volume: (cm) 2.403 Character of Wound/Ulcer Post Debridement: Improved Post Procedure Diagnosis Same as Pre-procedure Electronic Signature(s) Signed: 03/20/2022 3:43:19 PM By: Fredirick Maudlin MD FACS Signed: 03/21/2022 6:00:02 PM By: Levan Hurst RN, BSN Entered By: Levan Hurst on 03/20/2022 15:31:17 -------------------------------------------------------------------------------- HPI Details Patient Name: Date of Service: Stacy Like B. 03/20/2022 2:45 PM Medical Record Number: [id number] Patient Account Number: [id number] Date of Birth/Sex: Treating RN: 02-09-1923 (88 y.o. [name] Primary Care Provider: [name] Other Clinician: Referring Provider: Treating Provider/Extender: Nicholaus Corolla in Treatment: 13 History of Present Illness HPI Description: ADMISSION 12/19/21 This is a 88 year old woman who is presenting to clinic today with a an open ulcer over her thoracic spine. She has severe kyphosis and a history of prior T12 fracture, as well as type 2 diabetes mellitus. The wound has been present for about a year. She is accompanied by her daughters, 1 of whom is a Marine scientist. They have been trying to offload the site by using pillows and supports to keep returned while in bed, and an eggcrate foam with a cut out for the wound for when she is sitting up. They have been applying Santyl to the site. She does have home health and the provider took a culture which was positive for Pseudomonas. She was on ciprofloxacin for this. A recent repeat culture was negative. Most recent hemoglobin A1c was 6.5, in February. A C-reactive protein also obtained in February was elevated at 24.1. No imaging has been performed of the site. The patient denies significant pain. 12/25/2021: Last week, I took a bone biopsy as well as culture. Fortunately, the bone  biopsy was negative for osteomyelitis. The culture returned with rare corynebacterium and rare Enterococcus faecalis. We have been using Santyl and Hydrofera Blue. T oday, the wound appears cleaner and there are buds of granulation tissue forming around the perimeter. 01/01/2022: Due to the positive culture, we switched to topical gentamicin.  Today, she has had a little bit of a slough accumulation. The wound continues to have buds of granulation tissue forming. Apparently, the Hydrofera Blue was placed just over the wound opening rather than down in to contact the surface. No purulent drainage or odor. 01/08/2022: Last week, there was more slough in the wound bed, so I changed back to Santyl. There is now hypertrophic granulation tissue around the wound opening. Once again, we seem to be having some difficulty getting the Hydrofera Blue tucked up into the wound; there is undermining that extends for 3 to 4 cm at the 12 o'clock position and the Hydrofera Blue is simply sitting on top of the wound without being in contact with the surface. Bone is still palpable, but tissue is beginning to close in over it. 01/22/2022: The wound looks much cleaner this week and the hypertrophic granulation tissue responded appropriately to silver nitrate application. She continues to have undermining for about 4 cm from 12:00 to 4:00. I can still feel the bone in the base at about the 2 o'clock position, but it is less prominent and tissue is closing over. We have been using Santyl and Hydrofera Blue. The daughter that accompanies the patient today indicates that they have been very diligent in making sure the [facility] is tucked under and into the full base of the wound. 02/05/2022: The wound overall is improved. The undermining is about the same but there is less bone palpable. We have been using Santyl and Hydrofera Blue. Minimal slough accumulation in the 2 weeks since her last visit. 02/19/2022: The visible wound  surface is very clean without any slough accumulation. Although I can feel bone in the undermined portion of the wound, it is now covered with a layer of tissue. The undermined area is about the same size, however. We have been using Santyl with Hydrofera Blue. She has been approved for snap VAC use. 02/26/2022: Last week, we applied a snap VAC. The wound dimensions have come in by a bit in all directions. The VAC canister did fill, however, and was starting to leak so the patient's daughter removed the VAC and dressed the wound as we had been previously. There is a little bit of thin slough on the wound surface with some senescent tissue at the inferior margin. Bone is still palpable but covered with a layer of tissue. No odor. 03/06/2022: We continue to have technical difficulties with the snap VAC which resulted in the patient's family removing it and going back to the dressing changes with Hydrofera Blue. It sounds like there are issues maintaining suction and the drape is getting bunched up creating a leak. The wound measured larger today, but the intake nurse thinks that perhaps her measurements were taken slightly differently than last week's. There is just some thin slough on the wound surface as well as some hypertrophic granulation tissue on the lateral border. 03/13/2022: The snap VAC worked much better this week and only began leaking yesterday. At that point, the patient's family removed it and applied Hydrofera Blue to her wound. Today, the tissue coverage over the bone that had been exposed is more substantial. She still has undermining present. There is also some senescent skin around the wound orifice along with some hypertrophic granulation tissue. 03/20/2022: The snap VAC unfortunately failed on Friday. There has really been no significant change to the wound for several weeks. The degree of undermining is unchanged. The surface is clean. Electronic Signature(s) Signed: 03/20/2022  3:38:53 PM  By: Fredirick Maudlin MD FACS Entered By: Fredirick Maudlin on 03/20/2022 15:38:53 -------------------------------------------------------------------------------- Physical Exam Details Patient Name: Date of Service: [name], [name] 03/20/2022 2:45 PM Medical Record Number: [id number] Patient Account Number: [id number] Date of Birth/Sex: Treating RN: 13-Dec-1922 (88 y.o. [name] Primary Care Provider: [name] Other Clinician: Referring Provider: Treating Provider/Extender: Nicholaus Corolla in Treatment: 13 Constitutional . . . . No acute distress.Marland Kitchen Respiratory Normal work of breathing on room air.. Notes 03/20/2022: There has been no significant change to the wound in terms of dimensions for the past couple of weeks. The surface is clean but the dimensions are stable. Electronic Signature(s) Signed: 03/20/2022 3:39:50 PM By: Fredirick Maudlin MD FACS Entered By: Fredirick Maudlin on 03/20/2022 15:39:49 -------------------------------------------------------------------------------- Physician Orders Details Patient Name: Date of Service: Stacy Like B. 03/20/2022 2:45 PM Medical Record Number: [id number] Patient Account Number: [id number] Date of Birth/Sex: Treating RN: 06-07-1923 (88 y.o. [name] Primary Care Provider: [name] Other Clinician: Referring Provider: Treating Provider/Extender: Nicholaus Corolla in Treatment: [phone] Verbal / Phone Orders: No Diagnosis Coding ICD-10 Coding Code Description L89.104 Pressure ulcer of unspecified part of back, stage 4 E11.622 Type 2 diabetes mellitus with other skin ulcer S22.089S Unspecified fracture of T11-T12 vertebra, sequela Follow-up Appointments ppointment in 1 week. - Dr Celine Ahr - Room 1 - Tuesday 6/20 at 2:00 pm Return A Bathing/ Shower/ Hygiene May shower with protection but do not get wound dressing(s) wet. Negative Presssure Wound  Therapy Wound Vac to wound continuously at 156m/hg pressure - Wound clinic will order from [facility]. Home health to initiate once available. SNAP Vac to wound continuously at 1269mhg pressure - SNAP VAC change weekly (wound clinic to apply this week) - discharge SNAP vac once standard wound vac available Off-Loading Turn and reposition every 2 hours Other: - KEEP PRESSURE OFF OF BACK MAY USE EGG [facility]. May utilize formulary equivalent dressing for wound treatment orders unless otherwise specified. - A Skilled nursing for wound vac dressing change 3x a week Wound Treatment Wound #1 - Thoracic spine Cleanser: Wound Cleanser (Generic) 1 x Per Day/30 Days Discharge Instructions: Cleanse the wound with wound cleanser prior to applying a clean dressing using gauze sponges, not tissue or cotton balls. Peri-Wound Care: Skin Prep 1 x Per Day/30 Days Discharge Instructions: Use skin prep as directed Prim Dressing: Promogran Prisma Matrix, 4.34 (sq in) (silver collagen) 1 x Per Day/30 Days ary Discharge Instructions: Moisten collagen with saline or hydrogel Prim Dressing: SNAP VAC ary 1 x Per Day/30 Days Electronic Signature(s) Signed: 03/20/2022 3:43:19 PM By: CaFredirick MaudlinD FACS Entered By: CaFredirick Maudlinn 03/20/2022 15:40:07 -------------------------------------------------------------------------------- Problem List Details Patient Name: Date of Service: HAJim Like. 03/20/2022 2:45 PM Medical Record Number: [id number] Account Number: [id number] [id number] Birth/Sex: Treating RN: [id number].o. F)Stacy Fetterrimary Care Provider: RoJanace Littenther Clinician: Referring Provider: Treating Provider/Extender: CaNicholaus Corollan Treatment: 13 Active Problems ICD-10 Encounter Code Description Active Date MDM Diagnosis L89.104 Pressure ulcer of unspecified part of back, stage 4  12/19/2021 No Yes E11.622 Type 2 diabetes mellitus with other skin ulcer 12/19/2021 No Yes S22.089S Unspecified fracture of T11-T12 vertebra, sequela 12/19/2021 No Yes Inactive Problems Resolved Problems Electronic Signature(s) Signed: 03/20/2022 3:35:04 PM By: CaFredirick MaudlinD FACS Entered By: CaFredirick Maudlinn 03/20/2022 15:35:03 -------------------------------------------------------------------------------- Progress Note Details Patient Name: Date of Service: [name],  [name] 03/20/2022 2:45 PM Medical Record Number: [id number] Patient Account Number: [id number] Date of Birth/Sex: Treating RN: [birth year] (88 y.o. [name] Primary Care Provider: [name] Other Clinician: Referring Provider: Treating Provider/Extender: Nicholaus Corolla in Treatment: 13 Subjective Chief Complaint Information obtained from Patient Patient is at the clinic for treatment of an open pressure ulcer History of Present Illness (HPI) ADMISSION 12/19/21 This is a 88 year old woman who is presenting to clinic today with a an open ulcer over her thoracic spine. She has severe kyphosis and a history of prior T12 fracture, as well as type 2 diabetes mellitus. The wound has been present for about a year. She is accompanied by her daughters, 1 of whom is a Marine scientist. They have been trying to offload the site by using pillows and supports to keep returned while in bed, and an eggcrate foam with a cut out for the wound for when she is sitting up. They have been applying Santyl to the site. She does have home health and the provider took a culture which was positive for Pseudomonas. She was on ciprofloxacin for this. A recent repeat culture was negative. Most recent hemoglobin A1c was 6.5, in February. A C-reactive protein also obtained in February was elevated at 24.1. No imaging has been performed of the site. The patient denies significant pain. 12/25/2021: Last week, I took a bone  biopsy as well as culture. Fortunately, the bone biopsy was negative for osteomyelitis. The culture returned with rare corynebacterium and rare Enterococcus faecalis. We have been using Santyl and Hydrofera Blue. T oday, the wound appears cleaner and there are buds of granulation tissue forming around the perimeter. 01/01/2022: Due to the positive culture, we switched to topical gentamicin. Today, she has had a little bit of a slough accumulation. The wound continues to have buds of granulation tissue forming. Apparently, the Hydrofera Blue was placed just over the wound opening rather than down in to contact the surface. No purulent drainage or odor. 01/08/2022: Last week, there was more slough in the wound bed, so I changed back to Santyl. There is now hypertrophic granulation tissue around the wound opening. Once again, we seem to be having some difficulty getting the Hydrofera Blue tucked up into the wound; there is undermining that extends for 3 to 4 cm at the 12 o'clock position and the Hydrofera Blue is simply sitting on top of the wound without being in contact with the surface. Bone is still palpable, but tissue is beginning to close in over it. 01/22/2022: The wound looks much cleaner this week and the hypertrophic granulation tissue responded appropriately to silver nitrate application. She continues to have undermining for about 4 cm from 12:00 to 4:00. I can still feel the bone in the base at about the 2 o'clock position, but it is less prominent and tissue is closing over. We have been using Santyl and Hydrofera Blue. The daughter that accompanies the patient today indicates that they have been very diligent in making sure the [facility] is tucked under and into the full base of the wound. 02/05/2022: The wound overall is improved. The undermining is about the same but there is less bone palpable. We have been using Santyl and Hydrofera Blue. Minimal slough accumulation in the 2 weeks  since her last visit. 02/19/2022: The visible wound surface is very clean without any slough accumulation. Although I can feel bone in the undermined portion of the wound, it is now covered with a  layer of tissue. The undermined area is about the same size, however. We have been using Santyl with Hydrofera Blue. She has been approved for snap VAC use. 02/26/2022: Last week, we applied a snap VAC. The wound dimensions have come in by a bit in all directions. The VAC canister did fill, however, and was starting to leak so the patient's daughter removed the VAC and dressed the wound as we had been previously. There is a little bit of thin slough on the wound surface with some senescent tissue at the inferior margin. Bone is still palpable but covered with a layer of tissue. No odor. 03/06/2022: We continue to have technical difficulties with the snap VAC which resulted in the patient's family removing it and going back to the dressing changes with Hydrofera Blue. It sounds like there are issues maintaining suction and the drape is getting bunched up creating a leak. The wound measured larger today, but the intake nurse thinks that perhaps her measurements were taken slightly differently than last week's. There is just some thin slough on the wound surface as well as some hypertrophic granulation tissue on the lateral border. 03/13/2022: The snap VAC worked much better this week and only began leaking yesterday. At that point, the patient's family removed it and applied Hydrofera Blue to her wound. Today, the tissue coverage over the bone that had been exposed is more substantial. She still has undermining present. There is also some senescent skin around the wound orifice along with some hypertrophic granulation tissue. 03/20/2022: The snap VAC unfortunately failed on Friday. There has really been no significant change to the wound for several weeks. The degree of undermining is unchanged. The surface is  clean. Patient History Family History Unknown History. Social History Never smoker, Marital Status - Married, Alcohol Use - Never, Drug Use - No History, Caffeine Use - Rarely. Medical History Ear/Nose/Mouth/Throat Denies history of Chronic sinus problems/congestion, Middle ear problems Cardiovascular Patient has history of Hypertension Endocrine Patient has history of Type II Diabetes Immunological Denies history of Lupus Erythematosus, Raynaudoos, Scleroderma Oncologic Denies history of Received Chemotherapy, Received Radiation Hospitalization/Surgery History - t/11 t/12 FRACTURE/SURGERY. - FEMUR FX SURGERY. - TOTAL HYSTERECTOMY. - CHOLECYSTECTOMY. - TONSILLECTOMY. Medical A Surgical History Notes nd Ear/Nose/Mouth/Throat PARTIAL HEARING LEFT HEAR W/ HEARING AID TOTAL DEAFNESS RIGHT EAR Gastrointestinal Melena Endocrine Hypothyroidism Hyperlipidemia associated with type 2 diabetes mellitus (HCC) Musculoskeletal Closed T12 fracture (HCC) Hip fracture (HCC) Closed comminuted intertrochanteric fracture of proximal end of right femur (HCC) Objective Constitutional No acute distress.. Vitals Time Taken: 3:16 PM, Height: 62 in, Weight: 110 lbs, BMI: 20.1, Temperature: 97.5 F, Pulse: 80 bpm, Respiratory Rate: 16 breaths/min, Blood Pressure: 112/63 mmHg. Respiratory Normal work of breathing on room air.. General Notes: 03/20/2022: There has been no significant change to the wound in terms of dimensions for the past couple of weeks. The surface is clean but the dimensions are stable. Integumentary (Hair, Skin) Wound #1 status is Open. Original cause of wound was Gradually Appeared. The date acquired was: 12/19/2020. The wound has been in treatment 13 weeks. The wound is located on the Thoracic spine. The wound measures 1.7cm length x 1.5cm width x 1.2cm depth; 2.003cm^2 area and 2.403cm^3 volume. There is Fat Layer (Subcutaneous Tissue) exposed. There is undermining starting at  11:00 and ending at 3:00 with a maximum distance of 3.2cm. There is a medium amount of serosanguineous drainage noted. The wound margin is well defined and not attached to the wound base. There is large (  67-100%) pink granulation within the wound bed. There is a small (1-33%) amount of necrotic tissue within the wound bed including Adherent Slough. Assessment Active Problems ICD-10 Pressure ulcer of unspecified part of back, stage 4 Type 2 diabetes mellitus with other skin ulcer Unspecified fracture of T11-T12 vertebra, sequela Procedures Wound #1 Pre-procedure diagnosis of Wound #1 is a Pressure Ulcer located on the Thoracic spine . There was a Selective/Open Wound Non-Viable Tissue Debridement with a total area of 2.55 sq cm performed by Fredirick Maudlin, MD. With the following instrument(s): Curette to remove Non-Viable tissue/material. Material removed includes Slough and Biofilm and. No specimens were taken. A time out was conducted at 15:29, prior to the start of the procedure. A Minimum amount of bleeding was controlled with Pressure. The procedure was tolerated well with a pain level of 0 throughout and a pain level of 0 following the procedure. Post Debridement Measurements: 1.7cm length x 1.5cm width x 1.2cm depth; 2.403cm^3 volume. Post debridement Stage noted as Category/Stage IV. Character of Wound/Ulcer Post Debridement is improved. Post procedure Diagnosis Wound #1: Same as Pre-Procedure Plan Follow-up Appointments: Return Appointment in 1 week. - Dr Celine Ahr - Room 1 - Tuesday 6/20 at 2:00 pm Bathing/ Shower/ Hygiene: May shower with protection but do not get wound dressing(s) wet. Negative Presssure Wound Therapy: Wound Vac to wound continuously at 167m/hg pressure - Wound clinic will order from [facility]. Home health to initiate once available. SNAP Vac to wound continuously at 1247mhg pressure - SNAP VAC change weekly (wound clinic to apply this week) - discharge SNAP vac once  standard wound vac available Off-Loading: Turn and reposition every 2 hours Other: - KEEP PRESSURE OFF OF BACK MAY USE EGG CRATE CUSHION Home Health: [facility]. May utilize formulary equivalent dressing for wound treatment orders unless otherwise specified. - Skilled nursing for wound vac dressing change 3x a week WOUND #1: - Thoracic spine Wound Laterality: Cleanser: Wound Cleanser (Generic) 1 x Per Day/30 Days Discharge Instructions: Cleanse the wound with wound cleanser prior to applying a clean dressing using gauze sponges, not tissue or cotton balls. Peri-Wound Care: Skin Prep 1 x Per Day/30 Days Discharge Instructions: Use skin prep as directed Prim Dressing: Promogran Prisma Matrix, 4.34 (sq in) (silver collagen) 1 x Per Day/30 Days ary Discharge Instructions: Moisten collagen with saline or hydrogel Prim Dressing: SNAP VAC 1 x Per Day/30 Days ary 03/20/2022: There has been no significant change to the wound in terms of dimensions for the past couple of weeks. The surface is clean but the dimensions are stable. I debrided the entire surface of the wound with a curette to try and stimulate the healing process again. We are going to apply Prisma silver collagen to the wound bed underneath the snap VAC. I think we may need to change to a more traditional VAC as we are still experiencing issues with maintaining a seal with the snap VAC. We will check her insurance for this. I have also encouraged her to drink a second protein shake daily to try and improve her protein calorie intake. Follow-up in 1 week. Electronic Signature(s) Signed: 03/20/2022 3:42:04 PM By: CaFredirick MaudlinD FACS Previous Signature: 03/20/2022 3:40:51 PM Version By: CaFredirick MaudlinD FACS Entered By: CaFredirick Maudlinn 03/20/2022 15:42:03 -------------------------------------------------------------------------------- HxROS Details Patient Name: Date of Service: HAJim Like.  03/20/2022 2:45 PM Medical Record Number: [id number] Account Number: [id number] of Birth/Sex: Treating RN: [id number].o. F) BoBaruch Gouty  Primary Care Provider: [name] Other Clinician: Referring Provider: Treating Provider/Extender: Nicholaus Corolla in Treatment: 13 Ear/Nose/Mouth/Throat Medical History: Negative for: Chronic sinus problems/congestion; Middle ear problems Past Medical History Notes: PARTIAL HEARING LEFT HEAR W/ HEARING AID TOTAL DEAFNESS RIGHT EAR Cardiovascular Medical History: Positive for: Hypertension Gastrointestinal Medical History: Past Medical History Notes: Melena Endocrine Medical History: Positive for: Type II Diabetes Past Medical History Notes: Hypothyroidism Hyperlipidemia associated with type 2 diabetes mellitus ([place]) Treated with: Diet Immunological Medical History: Negative for: Lupus Erythematosus; Raynauds; Scleroderma Musculoskeletal Medical History: Past Medical History Notes: Closed T12 fracture ([place]) Hip fracture ([place]) Closed comminuted intertrochanteric fracture of proximal end of right femur [facility]) Oncologic Medical History: Negative for: Received Chemotherapy; Received Radiation Immunizations Pneumococcal Vaccine: Received Pneumococcal Vaccination: Yes Received Pneumococcal Vaccination On or After 60th Birthday: No Implantable Devices None Hospitalization / Surgery History Type of Hospitalization/Surgery t/11 t/12 FRACTURE/SURGERY FEMUR FX SURGERY TOTAL HYSTERECTOMY CHOLECYSTECTOMY TONSILLECTOMY Family and Social History Unknown History: Yes; Never smoker; Marital Status - Married; Alcohol Use: Never; Drug Use: No History; Caffeine Use: Rarely; Financial Concerns: No; Food, Clothing or Shelter Needs: No; Support System Lacking: No; Transportation Concerns: No Electronic Signature(s) Signed: 03/20/2022 3:43:19 PM By: Fredirick Maudlin MD FACS Signed: 03/21/2022 5:43:09 PM  By: Baruch Gouty RN, BSN Entered By: Fredirick Maudlin on 03/20/2022 15:39:00 -------------------------------------------------------------------------------- Crum Details Patient Name: Date of Service: Stacy Like B. 03/20/2022 Medical Record Number: [id number] Patient Account Number: [id number] Date of Birth/Sex: Treating RN: 04/27/1923 (88 y.o. [name] Primary Care Provider: [name] Other Clinician: Referring Provider: Treating Provider/Extender: Nicholaus Corolla in Treatment: 13 Diagnosis Coding ICD-10 Codes Code Description L89.104 Pressure ulcer of unspecified part of back, stage 4 E11.622 Type 2 diabetes mellitus with other skin ulcer S22.089S Unspecified fracture of T11-T12 vertebra, sequela Facility Procedures CPT4 Code: [id number] Description: [id number] - DEBRIDE WOUND 1ST 20 SQ CM OR < ICD-10 Diagnosis Description L89.104 Pressure ulcer of unspecified part of back, stage 4 Modifier: Quantity: 1 Physician Procedures : CPT4 Code Description Modifier [id number] - WC PHYS LEVEL 3 - EST PT ICD-10 Diagnosis Description L89.104 Pressure ulcer of unspecified part of back, stage 4 E11.622 Type 2 diabetes mellitus with other skin ulcer S22.089S Unspecified fracture of  T11-T12 vertebra, sequela Quantity: 1 : [id number] - WC PHYS DEBR WO ANESTH 20 SQ CM ICD-10 Diagnosis Description L89.104 Pressure ulcer of unspecified part of back, stage 4 Quantity: 1 Electronic Signature(s) Signed: 03/21/2022 7:32:49 AM By: Fredirick Maudlin MD FACS Signed: 03/21/2022 6:00:02 PM By: Levan Hurst RN, BSN Previous Signature: 03/20/2022 3:41:30 PM Version By: Fredirick Maudlin MD FACS Entered By: Levan Hurst on 03/20/2022 16:39:24

## 2022-03-21 NOTE — Progress Notes (Signed)
[name], [name] ([id number]) Visit Report for 03/20/2022 Arrival Information Details Patient Name: Date of Service: [name], [name] 03/20/2022 2:45 PM Medical Record Number: [id number] Patient Account Number: [id number] Date of Birth/Sex: Treating RN: Feb 14, 1923 (88 y.o. [name], [name] Primary Care [name]: [name] Other Clinician: Referring [name]: Treating [name]/Extender: [name] in Treatment: 54 Visit Information History Since Last Visit Added or deleted any medications: No Patient Arrived: Wheel Chair Any new allergies or adverse reactions: No Arrival Time: 15:16 Had a fall or experienced change in No Accompanied By: daughter activities of daily living that may affect Transfer Assistance: Manual risk of falls: Patient Identification Verified: Yes Signs or symptoms of abuse/neglect since last visito No Secondary Verification Process Completed: Yes Hospitalized since last visit: No Patient Requires Transmission-Based Precautions: No Implantable device outside of the clinic excluding No Patient Has Alerts: No cellular tissue based products placed in the center since last visit: Has Dressing in Place as Prescribed: No Has Compression in Place as Prescribed: No Pain Present Now: No Electronic Signature(s) Signed: 03/21/2022 6:00:02 PM By: Stacy Hurst RN, BSN Entered By: [name] on 03/20/2022 15:17:15 -------------------------------------------------------------------------------- Encounter Discharge Information Details Patient Name: Date of Service: Stacy Like B. 03/20/2022 2:45 PM Medical Record Number: [id number] Patient Account Number: [id number] Date of Birth/Sex: Treating RN: [birth year] (88 y.o. [name] Primary Care [name]: [name] Other Clinician: Referring [name]: Treating [name]/Extender: [name] in Treatment: 13 Encounter Discharge Information Items  Post Procedure Vitals Discharge Condition: Stable Temperature (F): 97.5 Ambulatory Status: Wheelchair Pulse (bpm): 80 Discharge Destination: Home Respiratory Rate (breaths/min): 16 Transportation: Private Auto Blood Pressure (mmHg): 112/63 Accompanied By: daughter Schedule Follow-up Appointment: Yes Clinical Summary of Care: Patient Declined Electronic Signature(s) Signed: 03/21/2022 6:00:02 PM By: Stacy Hurst RN, BSN Entered By: [name] on 03/20/2022 16:37:41 -------------------------------------------------------------------------------- Multi Wound Chart Details Patient Name: Date of Service: Stacy Like B. 03/20/2022 2:45 PM Medical Record Number: [id number] Patient Account Number: [id number] Date of Birth/Sex: Treating RN: 11/06/1922 (88 y.o. [name] Primary Care Kiano Terrien: [name] Other Clinician: Referring [name]: Treating [name]/Extender: [name] in Treatment: 13 Vital Signs Height(in): 62 Pulse(bpm): 80 Weight(lbs): 110 Blood Pressure(mmHg): 112/63 Body Mass Index(BMI): 20.1 Temperature(F): 97.5 Respiratory Rate(breaths/min): 16 Photos: [N/A:N/A] Thoracic spine N/A N/A Wound Location: Gradually Appeared N/A N/A Wounding Event: Pressure Ulcer N/A N/A Primary Etiology: Hypertension, Type II Diabetes N/A N/A Comorbid History: 12/19/2020 N/A N/A Date Acquired: 13 N/A N/A Weeks of Treatment: Open N/A N/A Wound Status: No N/A N/A Wound Recurrence: 1.7x1.5x1.2 N/A N/A Measurements L x W x D (cm) 2.003 N/A N/A A (cm) : rea 2.403 N/A N/A Volume (cm) : 42.00% N/A N/A % Reduction in A rea: 53.60% N/A N/A % Reduction in Volume: 11 Starting Position 1 (o'clock): 3 Ending Position 1 (o'clock): 3.2 Maximum Distance 1 (cm): Yes N/A N/A Undermining: Category/Stage IV N/A N/A Classification: Medium N/A N/A Exudate A mount: Serosanguineous N/A N/A Exudate Type: red, brown N/A  N/A Exudate Color: Well defined, not attached N/A N/A Wound Margin: Large (67-100%) N/A N/A Granulation A mount: Pink N/A N/A Granulation Quality: Small (1-33%) N/A N/A Necrotic A mount: Fat Layer (Subcutaneous Tissue): Yes N/A N/A Exposed Structures: Fascia: No Tendon: No Muscle: No Joint: No Bone: No Small (1-33%) N/A N/A Epithelialization: Debridement - Selective/Open Wound N/A N/A Debridement: Pre-procedure Verification/Time Out 15:29 N/A N/A Taken: Slough N/A N/A Tissue Debrided: Non-Viable Tissue N/A N/A Level: 2.55 N/A N/A  Debridement A (sq cm): rea Curette N/A N/A Instrument: Minimum N/A N/A Bleeding: Pressure N/A N/A Hemostasis A chieved: 0 N/A N/A Procedural Pain: 0 N/A N/A Post Procedural Pain: Procedure was tolerated well N/A N/A Debridement Treatment Response: 1.7x1.5x1.2 N/A N/A Post Debridement Measurements L x W x D (cm) 2.403 N/A N/A Post Debridement Volume: (cm) Category/Stage IV N/A N/A Post Debridement Stage: Debridement N/A N/A Procedures Performed: Treatment Notes Electronic Signature(s) Signed: 03/20/2022 3:36:54 PM By: Stacy Maudlin MD FACS Signed: 03/21/2022 5:43:09 PM By: Baruch Gouty RN, BSN Entered By: [name] on 03/20/2022 15:36:54 -------------------------------------------------------------------------------- Multi-Disciplinary Care Plan Details Patient Name: Date of Service: Stacy Like B. 03/20/2022 2:45 PM Medical Record Number: [id number] Patient Account Number: [id number] Date of Birth/Sex: Treating RN: 01/11/1923 (88 y.o. [name] Primary Care Kylil Swopes: [name] Other Clinician: Referring Elfida Shimada: Treating Katerra Ingman/Extender: [name] in Treatment: 13 Multidisciplinary Care Plan reviewed with physician Active Inactive Pressure Nursing Diagnoses: Knowledge deficit related to causes and risk factors for pressure ulcer development Knowledge deficit  related to management of pressures ulcers Potential for impaired tissue integrity related to pressure, friction, moisture, and shear Goals: Patient will remain free of pressure ulcers Date Initiated: 12/25/2021 Target Resolution Date: 04/06/2022 Goal Status: Active Patient/caregiver will verbalize understanding of pressure ulcer management Date Initiated: 12/25/2021 Target Resolution Date: 04/06/2022 Goal Status: Active Interventions: Assess: immobility, friction, shearing, incontinence upon admission and as needed Assess offloading mechanisms upon admission and as needed Notes: Wound/Skin Impairment Nursing Diagnoses: Impaired tissue integrity Knowledge deficit related to ulceration/compromised skin integrity Goals: Patient will have a decrease in wound volume by X% from date: (specify in notes) Date Initiated: 12/20/2021 Date Inactivated: 01/08/2022 Target Resolution Date: 01/12/2022 Goal Status: Unmet Unmet Reason: pressure relief Patient/caregiver will verbalize understanding of skin care regimen Date Initiated: 12/20/2021 Target Resolution Date: 04/06/2022 Goal Status: Active Ulcer/skin breakdown will have a volume reduction of 30% by week 4 Date Initiated: 12/20/2021 Target Resolution Date: 04/06/2022 Goal Status: Active Ulcer/skin breakdown will have a volume reduction of 50% by week 8 Date Initiated: 12/20/2021 Target Resolution Date: 04/06/2022 Goal Status: Active Interventions: Assess patient/caregiver ability to obtain necessary supplies Assess patient/caregiver ability to perform ulcer/skin care regimen upon admission and as needed Assess ulceration(s) every visit Notes: Electronic Signature(s) Signed: 03/21/2022 6:00:02 PM By: Stacy Hurst RN, BSN Entered By: [name] on 03/20/2022 15:28:25 -------------------------------------------------------------------------------- Negative Pressure Wound Therapy Maintenance (NPWT) Details Patient Name: Date of Service: [name], [name] 03/20/2022 2:45 PM Medical Record Number: [id number] Patient Account Number: [id number] Date of Birth/Sex: Treating RN: Apr 28, 1923 (88 y.o. [name] Primary Care Jandi Swiger: [name] Other Clinician: Referring Riaz Onorato: Treating Shyam Dawson/Extender: [name] in Treatment: 13 NPWT Maintenance Performed for: Wound #1 Thoracic spine Performed By: Stacy Hurst, RN Type: Other Coverage Size (sq cm): 2.55 Pressure Type: Constant Pressure Setting: 125 mmHG Drain Type: None Primary Contact: Other : silver collagen Sponge/Dressing Type: Foam, Blue Date Initiated: 02/19/2022 Dressing Removed: No Canister Changed: No Dressing Reapplied: Yes Quantity of Sponges/Gauze Inserted: 1 piece blue foam inserted Respones T Treatment: o pt tolerated well Days On NPWT : 30 Post Procedure Diagnosis Same as Pre-procedure Electronic Signature(s) Signed: 03/21/2022 6:00:02 PM By: Stacy Hurst RN, BSN Entered By: [name] on 03/20/2022 16:38:58 -------------------------------------------------------------------------------- Pain Assessment Details Patient Name: Date of Service: Stacy Like B. 03/20/2022 2:45 PM Medical Record Number: [id number] Patient Account Number: [id number] Date of Birth/Sex: Treating RN: 11-03-1922 (88 y.o. [name] Primary Care  Yurianna Tusing: [name] Other Clinician: Referring Iliany Losier: Treating Caralyn Twining/Extender: [name] in Treatment: 13 Active Problems Location of Pain Severity and Description of Pain Patient Has Paino No Site Locations Pain Management and Medication Current Pain Management: Electronic Signature(s) Signed: 03/21/2022 6:00:02 PM By: Stacy Hurst RN, BSN Entered By: [name] on 03/20/2022 15:17:36 -------------------------------------------------------------------------------- Patient/Caregiver Education Details Patient Name: Date of  Service: [name] 6/13/2023andnbsp2:45 PM Medical Record Number: [id number] Patient Account Number: [id number] Date of Birth/Gender: Treating RN: [birth year] (88 y.o. [name] Primary Care Physician: [name] Other Clinician: Referring Physician: Treating Physician/Extender: [name] in Treatment: 13 Education Assessment Education Provided To: Patient Education Topics Provided Wound/Skin Impairment: Methods: Explain/Verbal Responses: State content correctly [employer]) Signed: 03/21/2022 6:00:02 PM By: Stacy Hurst RN, BSN Entered By: [name] on 03/20/2022 15:28:36 -------------------------------------------------------------------------------- Wound Assessment Details Patient Name: Date of Service: Stacy Like B. 03/20/2022 2:45 PM Medical Record Number: [id number] Patient Account Number: [id number] Date of Birth/Sex: Treating RN: 29-May-1923 (88 y.o. [name] Primary Care Merdis Snodgrass: [name] Other Clinician: Referring Darriana Deboy: Treating Helyne Genther/Extender: [name] in Treatment: 13 Wound Status Wound Number: 1 Primary Etiology: Pressure Ulcer Wound Location: Thoracic spine Wound Status: Open Wounding Event: Gradually Appeared Comorbid History: Hypertension, Type II Diabetes Date Acquired: 12/19/2020 Weeks Of Treatment: 13 Clustered Wound: No Photos Wound Measurements Length: (cm) 1.7 Width: (cm) 1.5 Depth: (cm) 1.2 Area: (cm) 2.003 Volume: (cm) 2.403 % Reduction in Area: 42% % Reduction in Volume: 53.6% Epithelialization: Small (1-33%) Undermining: Yes Starting Position (o'clock): 11 Ending Position (o'clock): 3 Maximum Distance: (cm) 3.2 Wound Description Classification: Category/Stage IV Wound Margin: Well defined, not attached Exudate Amount: Medium Exudate Type: Serosanguineous Exudate Color: red, brown Foul Odor After Cleansing:  No Slough/Fibrino Yes Wound Bed Granulation Amount: Large (67-100%) Exposed Structure Granulation Quality: Pink Fascia Exposed: No Necrotic Amount: Small (1-33%) Fat Layer (Subcutaneous Tissue) Exposed: Yes Necrotic Quality: Adherent Slough Tendon Exposed: No Muscle Exposed: No Joint Exposed: No Bone Exposed: No Treatment Notes Wound #1 (Thoracic spine) Cleanser Wound Cleanser Discharge Instruction: Cleanse the wound with wound cleanser prior to applying a clean dressing using gauze sponges, not tissue or cotton balls. Peri-Wound Care Skin Prep Discharge Instruction: Use skin prep as directed Topical Primary Dressing Promogran Prisma Matrix, 4.34 (sq in) (silver collagen) Discharge Instruction: Moisten collagen with saline or hydrogel SNAP VAC Secondary Dressing Secured With Compression Wrap Compression Stockings Add-Ons Electronic Signature(s) Signed: 03/21/2022 6:00:02 PM By: Stacy Hurst RN, BSN Entered By: [name] on 03/20/2022 15:19:46 -------------------------------------------------------------------------------- [place] Details Patient Name: Date of Service: Stacy Like B. 03/20/2022 2:45 PM Medical Record Number: [id number] Patient Account Number: [id number] Date of Birth/Sex: Treating RN: 1923/03/14 (88 y.o. [name] Primary Care Reonna Finlayson: [name] Other Clinician: Referring Roxy Mastandrea: Treating Kiran Carline/Extender: [name] in Treatment: 13 Vital Signs Time Taken: 15:16 Temperature (F): 97.5 Height (in): 62 Pulse (bpm): 80 Weight (lbs): 110 Respiratory Rate (breaths/min): 16 Body Mass Index (BMI): 20.1 Blood Pressure (mmHg): 112/63 Reference Range: 80 - 120 mg / dl Electronic Signature(s) Signed: 03/21/2022 6:00:02 PM By: Stacy Hurst RN, BSN Entered By: [name] on 03/20/2022 15:17:31

## 2022-03-26 ENCOUNTER — Ambulatory Visit (HOSPITAL_BASED_OUTPATIENT_CLINIC_OR_DEPARTMENT_OTHER): Payer: Medicare Other | Admitting: General Surgery

## 2022-03-27 ENCOUNTER — Encounter (HOSPITAL_BASED_OUTPATIENT_CLINIC_OR_DEPARTMENT_OTHER): Payer: Medicare Other | Admitting: General Surgery

## 2022-03-27 DIAGNOSIS — L89104 Pressure ulcer of unspecified part of back, stage 4: Secondary | ICD-10-CM | POA: Diagnosis not present

## 2022-03-28 NOTE — Progress Notes (Signed)
Stacy, Perry ([id number]) Visit Report for 03/27/2022 Chief Complaint Document Details Patient Name: Date of Service: [name], [name] 03/27/2022 2:00 PM Medical Record Number: [id number] Patient Account Number: [id number] Date of Birth/Sex: Treating RN: [birth year] (88 y.o. Elam Dutch Primary Care Provider: [name] Other Clinician: Referring Provider: Treating Provider/Extender: Nicholaus Corolla in Treatment: 14 Information Obtained from: Patient Chief Complaint Patient is at the clinic for treatment of an open pressure ulcer Electronic Signature(s) Signed: 03/27/2022 3:00:12 PM By: Fredirick Maudlin MD FACS Entered By: Fredirick Maudlin on 03/27/2022 15:00:11 -------------------------------------------------------------------------------- Debridement Details Patient Name: Date of Service: Stacy Like B. 03/27/2022 2:00 PM Medical Record Number: [id number] Patient Account Number: [id number] Date of Birth/Sex: Treating RN: 08/22/1923 (88 y.o. Elam Dutch Primary Care Provider: [name] Other Clinician: Referring Provider: Treating Provider/Extender: Nicholaus Corolla in Treatment: 14 Debridement Performed for Assessment: Wound #1 Thoracic spine Performed By: Physician Fredirick Maudlin, MD Debridement Type: Debridement Level of Consciousness (Pre-procedure): Awake and Alert Pre-procedure Verification/Time Out Yes - 14:50 Taken: Start Time: 14:53 Pain Control: Other : benzocaine 20% spray T Area Debrided (L x W): otal 1.7 (cm) x 1.7 (cm) = 2.89 (cm) Tissue and other material debrided: Non-Viable, Slough, Slough Level: Non-Viable Tissue Debridement Description: Selective/Open Wound Instrument: Curette Bleeding: Minimum Hemostasis Achieved: Pressure Procedural Pain: 2 Post Procedural Pain: 0 Response to Treatment: Procedure was tolerated well Level of Consciousness (Post- Awake and  Alert procedure): Post Debridement Measurements of Total Wound Length: (cm) 1.7 Stage: Category/Stage IV Width: (cm) 1.7 Depth: (cm) 1 Volume: (cm) 2.27 Character of Wound/Ulcer Post Debridement: Improved Post Procedure Diagnosis Same as Pre-procedure Electronic Signature(s) Signed: 03/27/2022 3:34:04 PM By: Fredirick Maudlin MD FACS Signed: 03/28/2022 6:21:16 PM By: Baruch Gouty RN, BSN Entered By: Baruch Gouty on 03/27/2022 14:55:48 -------------------------------------------------------------------------------- HPI Details Patient Name: Date of Service: Stacy Like B. 03/27/2022 2:00 PM Medical Record Number: [id number] Patient Account Number: [id number] Date of Birth/Sex: Treating RN: 1922/10/23 (88 y.o. Elam Dutch Primary Care Provider: [name] Other Clinician: Referring Provider: Treating Provider/Extender: Nicholaus Corolla in Treatment: 14 History of Present Illness HPI Description: ADMISSION 12/19/21 This is a 88 year old woman who is presenting to clinic today with a an open ulcer over her thoracic spine. She has severe kyphosis and a history of prior T12 fracture, as well as type 2 diabetes mellitus. The wound has been present for about a year. She is accompanied by her daughters, 1 of whom is a Marine scientist. They have been trying to offload the site by using pillows and supports to keep returned while in bed, and an eggcrate foam with a cut out for the wound for when she is sitting up. They have been applying Santyl to the site. She does have home health and the provider took a culture which was positive for Pseudomonas. She was on ciprofloxacin for this. A recent repeat culture was negative. Most recent hemoglobin A1c was 6.5, in February. A C-reactive protein also obtained in February was elevated at 24.1. No imaging has been performed of the site. The patient denies significant pain. 12/25/2021: Last week, I took a bone biopsy as well  as culture. Fortunately, the bone biopsy was negative for osteomyelitis. The culture returned with rare corynebacterium and rare Enterococcus faecalis. We have been using Santyl and Hydrofera Blue. T oday, the wound appears cleaner and there are buds of granulation tissue forming around the perimeter. 01/01/2022: Due to the positive culture, we switched  to topical gentamicin. Today, she has had a little bit of a slough accumulation. The wound continues to have buds of granulation tissue forming. Apparently, the Hydrofera Blue was placed just over the wound opening rather than down in to contact the surface. No purulent drainage or odor. 01/08/2022: Last week, there was more slough in the wound bed, so I changed back to Santyl. There is now hypertrophic granulation tissue around the wound opening. Once again, we seem to be having some difficulty getting the Hydrofera Blue tucked up into the wound; there is undermining that extends for 3 to 4 cm at the 12 o'clock position and the Hydrofera Blue is simply sitting on top of the wound without being in contact with the surface. Bone is still palpable, but tissue is beginning to close in over it. 01/22/2022: The wound looks much cleaner this week and the hypertrophic granulation tissue responded appropriately to silver nitrate application. She continues to have undermining for about 4 cm from 12:00 to 4:00. I can still feel the bone in the base at about the 2 o'clock position, but it is less prominent and tissue is closing over. We have been using Santyl and Hydrofera Blue. The daughter that accompanies the patient today indicates that they have been very diligent in making sure the [facility] is tucked under and into the full base of the wound. 02/05/2022: The wound overall is improved. The undermining is about the same but there is less bone palpable. We have been using Santyl and Hydrofera Blue. Minimal slough accumulation in the 2 weeks since her last  visit. 02/19/2022: The visible wound surface is very clean without any slough accumulation. Although I can feel bone in the undermined portion of the wound, it is now covered with a layer of tissue. The undermined area is about the same size, however. We have been using Santyl with Hydrofera Blue. She has been approved for snap VAC use. 02/26/2022: Last week, we applied a snap VAC. The wound dimensions have come in by a bit in all directions. The VAC canister did fill, however, and was starting to leak so the patient's daughter removed the VAC and dressed the wound as we had been previously. There is a little bit of thin slough on the wound surface with some senescent tissue at the inferior margin. Bone is still palpable but covered with a layer of tissue. No odor. 03/06/2022: We continue to have technical difficulties with the snap VAC which resulted in the patient's family removing it and going back to the dressing changes with Hydrofera Blue. It sounds like there are issues maintaining suction and the drape is getting bunched up creating a leak. The wound measured larger today, but the intake nurse thinks that perhaps her measurements were taken slightly differently than last week's. There is just some thin slough on the wound surface as well as some hypertrophic granulation tissue on the lateral border. 03/13/2022: The snap VAC worked much better this week and only began leaking yesterday. At that point, the patient's family removed it and applied Hydrofera Blue to her wound. Today, the tissue coverage over the bone that had been exposed is more substantial. She still has undermining present. There is also some senescent skin around the wound orifice along with some hypertrophic granulation tissue. 03/20/2022: The snap VAC unfortunately failed on Friday. There has really been no significant change to the wound for several weeks. The degree of undermining is unchanged. The surface is clean. 03/27/2022:  Once again,  the snap VAC failed prematurely and had to be removed on Saturday. The undermining remains the same. There is more tissue coverage of the previously exposed bone. The wound surface has just a light layer of biofilm and thin slough. She was approved for a standard wound VAC, but there are currently no home health agencies able to staff dressing changes for her; it is too difficult for her and her family to bring her to the wound center more than once a week. Electronic Signature(s) Signed: 03/27/2022 3:03:13 PM By: Fredirick Maudlin MD FACS Entered By: Fredirick Maudlin on 03/27/2022 15:03:13 -------------------------------------------------------------------------------- Physical Exam Details Patient Name: Date of Service: Stacy Like B. 03/27/2022 2:00 PM Medical Record Number: [id number] Patient Account Number: [id number] Date of Birth/Sex: Treating RN: 1922-12-09 (88 y.o. Elam Dutch Primary Care Provider: [name] Other Clinician: Referring Provider: Treating Provider/Extender: Nicholaus Corolla in Treatment: 14 Constitutional . . . . No acute distress.Marland Kitchen Respiratory Normal work of breathing on room air.. Notes 03/27/2022: The undermining remains the same. There is more tissue coverage of the previously exposed bone. The wound surface has just a light layer of biofilm and thin slough. Electronic Signature(s) Signed: 03/27/2022 3:03:57 PM By: Fredirick Maudlin MD FACS Entered By: Fredirick Maudlin on 03/27/2022 15:03:57 -------------------------------------------------------------------------------- Physician Orders Details Patient Name: Date of Service: Stacy Like B. 03/27/2022 2:00 PM Medical Record Number: [id number] Patient Account Number: [id number] Date of Birth/Sex: Treating RN: Aug 28, 1923 (88 y.o. Elam Dutch Primary Care Provider: [name] Other Clinician: Referring Provider: Treating Provider/Extender: Nicholaus Corolla in Treatment: [phone] Verbal / Phone Orders: No Diagnosis Coding ICD-10 Coding Code Description L89.104 Pressure ulcer of unspecified part of back, stage 4 E11.622 Type 2 diabetes mellitus with other skin ulcer S22.089S Unspecified fracture of T11-T12 vertebra, sequela Follow-up Appointments ppointment in 1 week. - Dr Celine Ahr - Room 1 - Tuesday 6/27 at 12:30 pm Return A Bathing/ Shower/ Hygiene May shower with protection but do not get wound dressing(s) wet. Negative Presssure Wound Therapy Wound Vac to wound continuously at 154m/hg pressure - Wound clinic will order from [facility]. Home health to initiate once available. SNAP Vac to wound continuously at 1255mhg pressure - SNAP VAC change weekly (wound clinic to apply this week) - discharge SNAP vac once standard wound vac available Off-Loading Turn and reposition every 2 hours Other: - KEEP PRESSURE OFF OF BACK [place] [facility] [facility]. May utilize formulary equivalent dressing for wound treatment orders unless otherwise specified. - A Skilled nursing for wound vac dressing change 3x a week Wound Treatment Wound #1 - Thoracic spine Cleanser: Wound Cleanser (Generic) 3 x Per Week/30 Days Discharge Instructions: Cleanse the wound with wound cleanser prior to applying a clean dressing using gauze sponges, not tissue or cotton balls. Peri-Wound Care: Skin Prep 3 x Per Week/30 Days Discharge Instructions: Use skin prep as directed Prim Dressing: Promogran Prisma Matrix, 4.34 (sq in) (silver collagen) 3 x Per Week/30 Days ary Discharge Instructions: Moisten collagen with saline or hydrogel Prim Dressing: SNAP VAC ary 3 x Per Week/30 Days Electronic Signature(s) Signed: 03/27/2022 3:34:04 PM By: CaFredirick MaudlinD FACS Entered By: CaFredirick Maudlinn 03/27/2022 15:04:04 -------------------------------------------------------------------------------- Problem List  Details Patient Name: Date of Service: HAJim Like. 03/27/2022 2:00 PM Medical Record Number: [id number] Account Number: [id number] of Birth/Sex: Treating RN: [birth year].o. F)Elam Dutchrimary Care Provider: RoJanace Littenther Clinician:  Referring Provider: Treating Provider/Extender: Nicholaus Corolla in Treatment: 14 Active Problems ICD-10 Encounter Code Description Active Date MDM Diagnosis L89.104 Pressure ulcer of unspecified part of back, stage 4 12/19/2021 No Yes E11.622 Type 2 diabetes mellitus with other skin ulcer 12/19/2021 No Yes S22.089S Unspecified fracture of T11-T12 vertebra, sequela 12/19/2021 No Yes Inactive Problems Resolved Problems Electronic Signature(s) Signed: 03/27/2022 2:59:54 PM By: Fredirick Maudlin MD FACS Entered By: Fredirick Maudlin on 03/27/2022 14:59:53 -------------------------------------------------------------------------------- Progress Note Details Patient Name: Date of Service: Stacy Like B. 03/27/2022 2:00 PM Medical Record Number: [id number] Patient Account Number: [id number] Date of Birth/Sex: Treating RN: 1922-12-28 (88 y.o. Elam Dutch Primary Care Provider: [name] Other Clinician: Referring Provider: Treating Provider/Extender: Nicholaus Corolla in Treatment: 14 Subjective Chief Complaint Information obtained from Patient Patient is at the clinic for treatment of an open pressure ulcer History of Present Illness (HPI) ADMISSION 12/19/21 This is a 88 year old woman who is presenting to clinic today with a an open ulcer over her thoracic spine. She has severe kyphosis and a history of prior T12 fracture, as well as type 2 diabetes mellitus. The wound has been present for about a year. She is accompanied by her daughters, 1 of whom is a Marine scientist. They have been trying to offload the site by using pillows and supports to keep returned while in bed,  and an eggcrate foam with a cut out for the wound for when she is sitting up. They have been applying Santyl to the site. She does have home health and the provider took a culture which was positive for Pseudomonas. She was on ciprofloxacin for this. A recent repeat culture was negative. Most recent hemoglobin A1c was 6.5, in February. A C-reactive protein also obtained in February was elevated at 24.1. No imaging has been performed of the site. The patient denies significant pain. 12/25/2021: Last week, I took a bone biopsy as well as culture. Fortunately, the bone biopsy was negative for osteomyelitis. The culture returned with rare corynebacterium and rare Enterococcus faecalis. We have been using Santyl and Hydrofera Blue. T oday, the wound appears cleaner and there are buds of granulation tissue forming around the perimeter. 01/01/2022: Due to the positive culture, we switched to topical gentamicin. Today, she has had a little bit of a slough accumulation. The wound continues to have buds of granulation tissue forming. Apparently, the Hydrofera Blue was placed just over the wound opening rather than down in to contact the surface. No purulent drainage or odor. 01/08/2022: Last week, there was more slough in the wound bed, so I changed back to Santyl. There is now hypertrophic granulation tissue around the wound opening. Once again, we seem to be having some difficulty getting the Hydrofera Blue tucked up into the wound; there is undermining that extends for 3 to 4 cm at the 12 o'clock position and the Hydrofera Blue is simply sitting on top of the wound without being in contact with the surface. Bone is still palpable, but tissue is beginning to close in over it. 01/22/2022: The wound looks much cleaner this week and the hypertrophic granulation tissue responded appropriately to silver nitrate application. She continues to have undermining for about 4 cm from 12:00 to 4:00. I can still feel the bone  in the base at about the 2 o'clock position, but it is less prominent and tissue is closing over. We have been using Santyl and Hydrofera Blue. The daughter that accompanies the patient today indicates  that they have been very diligent in making sure the [facility] is tucked under and into the full base of the wound. 02/05/2022: The wound overall is improved. The undermining is about the same but there is less bone palpable. We have been using Santyl and Hydrofera Blue. Minimal slough accumulation in the 2 weeks since her last visit. 02/19/2022: The visible wound surface is very clean without any slough accumulation. Although I can feel bone in the undermined portion of the wound, it is now covered with a layer of tissue. The undermined area is about the same size, however. We have been using Santyl with Hydrofera Blue. She has been approved for snap VAC use. 02/26/2022: Last week, we applied a snap VAC. The wound dimensions have come in by a bit in all directions. The VAC canister did fill, however, and was starting to leak so the patient's daughter removed the VAC and dressed the wound as we had been previously. There is a little bit of thin slough on the wound surface with some senescent tissue at the inferior margin. Bone is still palpable but covered with a layer of tissue. No odor. 03/06/2022: We continue to have technical difficulties with the snap VAC which resulted in the patient's family removing it and going back to the dressing changes with Hydrofera Blue. It sounds like there are issues maintaining suction and the drape is getting bunched up creating a leak. The wound measured larger today, but the intake nurse thinks that perhaps her measurements were taken slightly differently than last week's. There is just some thin slough on the wound surface as well as some hypertrophic granulation tissue on the lateral border. 03/13/2022: The snap VAC worked much better this week and only began  leaking yesterday. At that point, the patient's family removed it and applied Hydrofera Blue to her wound. Today, the tissue coverage over the bone that had been exposed is more substantial. She still has undermining present. There is also some senescent skin around the wound orifice along with some hypertrophic granulation tissue. 03/20/2022: The snap VAC unfortunately failed on Friday. There has really been no significant change to the wound for several weeks. The degree of undermining is unchanged. The surface is clean. 03/27/2022: Once again, the snap VAC failed prematurely and had to be removed on Saturday. The undermining remains the same. There is more tissue coverage of the previously exposed bone. The wound surface has just a light layer of biofilm and thin slough. She was approved for a standard wound VAC, but there are currently no home health agencies able to staff dressing changes for her; it is too difficult for her and her family to bring her to the wound center more than once a week. Patient History Family History Unknown History. Social History Never smoker, Marital Status - Married, Alcohol Use - Never, Drug Use - No History, Caffeine Use - Rarely. Medical History Ear/Nose/Mouth/Throat Denies history of Chronic sinus problems/congestion, Middle ear problems Cardiovascular Patient has history of Hypertension Endocrine Patient has history of Type II Diabetes Immunological Denies history of Lupus Erythematosus, Raynaudoos, Scleroderma Oncologic Denies history of Received Chemotherapy, Received Radiation Hospitalization/Surgery History - t/11 t/12 FRACTURE/SURGERY. - FEMUR FX SURGERY. - TOTAL HYSTERECTOMY. - CHOLECYSTECTOMY. - TONSILLECTOMY. Medical A Surgical History Notes nd Ear/Nose/Mouth/Throat PARTIAL HEARING LEFT HEAR W/ HEARING AID TOTAL DEAFNESS RIGHT EAR Gastrointestinal Melena Endocrine Hypothyroidism Hyperlipidemia associated with type 2 diabetes mellitus  (HCC) Musculoskeletal Closed T12 fracture (HCC) Hip fracture (HCC) Closed comminuted intertrochanteric fracture of  proximal end of right femur ([place]) Objective Constitutional No acute distress.. Vitals Time Taken: 2:19 PM, Height: 62 in, Source: Stated, Weight: 110 lbs, Source: Stated, BMI: 20.1, Temperature: 97.5 F, Pulse: 83 bpm, Respiratory Rate: 18 breaths/min, Blood Pressure: 119/71 mmHg. Respiratory Normal work of breathing on room air.. General Notes: 03/27/2022: The undermining remains the same. There is more tissue coverage of the previously exposed bone. The wound surface has just a light layer of biofilm and thin slough. Integumentary (Hair, Skin) Wound #1 status is Open. Original cause of wound was Gradually Appeared. The date acquired was: 12/19/2020. The wound has been in treatment 14 weeks. The wound is located on the Thoracic spine. The wound measures 1.7cm length x 1.7cm width x 1cm depth; 2.27cm^2 area and 2.27cm^3 volume. There is Fat Layer (Subcutaneous Tissue) exposed. There is no tunneling noted, however, there is undermining starting at 12:00 and ending at 4:00 with a maximum distance of 3.7cm. There is a medium amount of serosanguineous drainage noted. The wound margin is well defined and not attached to the wound base. There is large (67-100%) red, pink granulation within the wound bed. There is a small (1-33%) amount of necrotic tissue within the wound bed including Adherent Slough. Assessment Active Problems ICD-10 Pressure ulcer of unspecified part of back, stage 4 Type 2 diabetes mellitus with other skin ulcer Unspecified fracture of T11-T12 vertebra, sequela Procedures Wound #1 Pre-procedure diagnosis of Wound #1 is a Pressure Ulcer located on the Thoracic spine . There was a Selective/Open Wound Non-Viable Tissue Debridement with a total area of 2.89 sq cm performed by Fredirick Maudlin, MD. With the following instrument(s): Curette to remove Non-Viable  tissue/material. Material removed includes [facility] after achieving pain control using Other (benzocaine 20% spray). No specimens were taken. A time out was conducted at 14:50, prior to the start of the procedure. A Minimum amount of bleeding was controlled with Pressure. The procedure was tolerated well with a pain level of 2 throughout and a pain level of 0 following the procedure. Post Debridement Measurements: 1.7cm length x 1.7cm width x 1cm depth; 2.27cm^3 volume. Post debridement Stage noted as Category/Stage IV. Character of Wound/Ulcer Post Debridement is improved. Post procedure Diagnosis Wound #1: Same as Pre-Procedure Plan Follow-up Appointments: Return Appointment in 1 week. - Dr Celine Ahr - Room 1 - Tuesday 6/27 at 12:30 pm Bathing/ Shower/ Hygiene: May shower with protection but do not get wound dressing(s) wet. Negative Presssure Wound Therapy: Wound Vac to wound continuously at 125m/hg pressure - Wound clinic will order from [facility]. Home health to initiate once available. SNAP Vac to wound continuously at 1267mhg pressure - SNAP VAC change weekly (wound clinic to apply this week) - discharge SNAP vac once standard wound vac available Off-Loading: Turn and reposition every 2 hours Other: - KEEP PRESSURE OFF OF BACK MAY USE EGG CRATE CUSHION [facility] Health: [facility] [facility]. May utilize formulary equivalent dressing for wound treatment orders unless otherwise specified. - Skilled nursing for wound vac dressing change 3x a week WOUND #1: - Thoracic spine Wound Laterality: Cleanser: Wound Cleanser (Generic) 3 x Per Week/30 Days Discharge Instructions: Cleanse the wound with wound cleanser prior to applying a clean dressing using gauze sponges, not tissue or cotton balls. Peri-Wound Care: Skin Prep 3 x Per Week/30 Days Discharge Instructions: Use skin prep as directed Prim Dressing: Promogran Prisma Matrix, 4.34 (sq in) (silver collagen) 3 x Per Week/30  Days ary Discharge Instructions: Moisten collagen with saline or hydrogel  Prim Dressing: SNAP VAC 3 x Per Week/30 Days ary 03/27/2022: The undermining remains the same. There is more tissue coverage of the previously exposed bone. The wound surface has just a light layer of biofilm and thin slough. I used a curette to debride the biofilm and slough from the wound. We will continue to work on the staffing issue for her standard wound VAC. In the meantime, we will continue with the snap VAC with Prisma silver collagen on the wound surface. Follow-up in 1 week. Electronic Signature(s) Signed: 03/27/2022 3:04:37 PM By: Fredirick Maudlin MD FACS Entered By: Fredirick Maudlin on 03/27/2022 15:04:37 -------------------------------------------------------------------------------- HxROS Details Patient Name: Date of Service: Stacy Like B. 03/27/2022 2:00 PM Medical Record Number: [id number] Patient Account Number: [id number] Date of Birth/Sex: Treating RN: 10/03/1923 (88 y.o. 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 Cardiovascular Medical History: Positive for: Hypertension Gastrointestinal Medical History: Past Medical History Notes: Melena Endocrine Medical History: Positive for: Type II Diabetes Past Medical History Notes: Hypothyroidism Hyperlipidemia associated with type 2 diabetes mellitus ([place]) Treated with: Diet Immunological Medical History: Negative for: Lupus Erythematosus; Raynauds; Scleroderma Musculoskeletal Medical History: Past Medical History Notes: Closed T12 fracture ([place]) Hip fracture ([place]) Closed comminuted  intertrochanteric fracture of proximal end of right femur [facility]) Oncologic Medical History: Negative for: Received Chemotherapy; Received Radiation Immunizations Pneumococcal Vaccine: Received Pneumococcal Vaccination: Yes Received Pneumococcal Vaccination On or After 60th Birthday: No Implantable Devices None Hospitalization / Surgery History Type of Hospitalization/Surgery t/11 t/12 FRACTURE/SURGERY FEMUR FX SURGERY TOTAL HYSTERECTOMY CHOLECYSTECTOMY TONSILLECTOMY Family and Social History Unknown History: Yes; Never smoker; Marital Status - Married; Alcohol Use: Never; Drug Use: No History; Caffeine Use: Rarely; Financial Concerns: No; Food, Clothing or Shelter Needs: No; Support System Lacking: No; Transportation Concerns: No Engineer, maintenance) Signed: 03/27/2022 3:34:04 PM By: Fredirick Maudlin MD FACS Signed: 03/28/2022 6:21:16 PM By: Baruch Gouty RN, BSN Entered By: Fredirick Maudlin on 03/27/2022 15:03:22 -------------------------------------------------------------------------------- SuperBill Details Patient Name: Date of Service: [name] 03/27/2022 Medical Record Number: [id number] Patient Account Number: [id number] Date of Birth/Sex: Treating RN: [birth year] (88 y.o. Elam Dutch Primary Care Provider: [name] Other Clinician: Referring Provider: Treating Provider/Extender: Nicholaus Corolla in Treatment: 14 Diagnosis Coding ICD-10 Codes Code Description L89.104 Pressure ulcer of unspecified part of back, stage 4 E11.622 Type 2 diabetes mellitus with other skin ulcer S22.089S Unspecified fracture of T11-T12 vertebra, sequela Facility Procedures CPT4 Code: [id number] Description: [phone] - DEBRIDE WOUND 1ST 20 SQ CM OR < ICD-10 Diagnosis Description L89.104 Pressure ulcer of unspecified part of back, stage 4 Modifier: Quantity: 1 Physician Procedures : CPT4 Code Description Modifier [id number] - WC PHYS LEVEL 3 -  EST PT 25 ICD-10 Diagnosis Description L89.104 Pressure ulcer of unspecified part of back, stage 4 E11.622 Type 2 diabetes mellitus with other skin ulcer S22.089S Unspecified fracture of  T11-T12 vertebra, sequela Quantity: 1 : [id number] - WC PHYS DEBR WO ANESTH 20 SQ CM ICD-10 Diagnosis Description L89.104 Pressure ulcer of unspecified part of back, stage 4 Quantity: 1 Electronic Signature(s) Signed: 03/27/2022 3:34:04 PM By: Fredirick Maudlin MD FACS Signed: 03/28/2022 6:21:16 PM By: Baruch Gouty RN, BSN Previous Signature: 03/27/2022 3:04:53 PM Version By: Fredirick Maudlin MD FACS Entered By: Baruch Gouty on 03/27/2022 15:17:46

## 2022-03-28 NOTE — Progress Notes (Signed)
[name], [name] ([id number]) Visit Report for 03/27/2022 Arrival Information Details Patient Name: Date of Service: [name], [name] 03/27/2022 2:00 PM Medical Record Number: [id number] Patient Account Number: [id number] Date of Birth/Sex: Treating RN: 1923-07-10 (88 y.o. Martyn Malay, [name] Primary Care Terrian Ridlon: [name] Other Clinician: Referring Madalin Hughart: Treating Tresean Mattix/Extender: Nicholaus Corolla in Treatment: 14 Visit Information History Since Last Visit Added or deleted any medications: No Patient Arrived: Wheel Chair Any new allergies or adverse reactions: No Arrival Time: 14:16 Had a fall or experienced change in No Accompanied By: daughter activities of daily living that may affect Transfer Assistance: Manual risk of falls: Patient Identification Verified: Yes Signs or symptoms of abuse/neglect since last visito No Secondary Verification Process Completed: Yes Hospitalized since last visit: No Patient Requires Transmission-Based Precautions: No Implantable device outside of the clinic excluding No Patient Has Alerts: No cellular tissue based products placed in the center since last visit: Has Dressing in Place as Prescribed: No Pain Present Now: Yes Notes SNAP lost suction and removed by family on Saturday Electronic Signature(s) Signed: 03/28/2022 6:21:16 PM By: Baruch Gouty RN, BSN Entered By: Baruch Gouty on 03/27/2022 14:19:14 -------------------------------------------------------------------------------- Encounter Discharge Information Details Patient Name: Date of Service: Stacy Like B. 03/27/2022 2:00 PM Medical Record Number: [id number] Patient Account Number: [id number] Date of Birth/Sex: Treating RN: August 10, 1923 (88 y.o. Elam Dutch Primary Care Rosalynn Sergent: [name] Other Clinician: Referring Ermagene Saidi: Treating Daneille Desilva/Extender: Nicholaus Corolla in Treatment: 14 Encounter  Discharge Information Items Post Procedure Vitals Discharge Condition: Stable Temperature (F): 97.5 Ambulatory Status: Wheelchair Pulse (bpm): 83 Discharge Destination: Home Respiratory Rate (breaths/min): 18 Transportation: Private Auto Blood Pressure (mmHg): 119/71 Accompanied By: daughter Schedule Follow-up Appointment: Yes Clinical Summary of Care: Patient Declined Electronic Signature(s) Signed: 03/28/2022 6:21:16 PM By: Baruch Gouty RN, BSN Entered By: Baruch Gouty on 03/27/2022 15:24:23 -------------------------------------------------------------------------------- Lower Extremity Assessment Details Patient Name: Date of Service: [name], [name] 03/27/2022 2:00 PM Medical Record Number: [id number] Patient Account Number: [id number] Date of Birth/Sex: Treating RN: [birth year] (88 y.o. Elam Dutch Primary Care Katori Wirsing: [name] Other Clinician: Referring Malikah Principato: Treating Oria Klimas/Extender: Nicholaus Corolla in Treatment: 14 Electronic Signature(s) Signed: 03/28/2022 6:21:16 PM By: Baruch Gouty RN, BSN Entered By: Baruch Gouty on 03/27/2022 14:20:35 -------------------------------------------------------------------------------- Multi Wound Chart Details Patient Name: Date of Service: Stacy Like B. 03/27/2022 2:00 PM Medical Record Number: [id number] Patient Account Number: [id number] Date of Birth/Sex: Treating RN: Nov 26, 1922 (88 y.o. Elam Dutch Primary Care Cephus Tupy: [name] Other Clinician: Referring Ysabella Babiarz: Treating Zyairah Wacha/Extender: Nicholaus Corolla in Treatment: 14 Vital Signs Height(in): 62 Pulse(bpm): 86 Weight(lbs): 110 Blood Pressure(mmHg): 119/71 Body Mass Index(BMI): 20.1 Temperature(F): 97.5 Respiratory Rate(breaths/min): 18 Photos: [N/A:N/A] Thoracic spine N/A N/A Wound Location: Gradually Appeared N/A N/A Wounding Event: Pressure Ulcer N/A  N/A Primary Etiology: Hypertension, Type II Diabetes N/A N/A Comorbid History: 12/19/2020 N/A N/A Date Acquired: 14 N/A N/A Weeks of Treatment: Open N/A N/A Wound Status: No N/A N/A Wound Recurrence: 1.7x1.7x1 N/A N/A Measurements L x W x D (cm) 2.27 N/A N/A A (cm) : rea 2.27 N/A N/A Volume (cm) : 34.30% N/A N/A % Reduction in A rea: 56.20% N/A N/A % Reduction in Volume: 12 Starting Position 1 (o'clock): 4 Ending Position 1 (o'clock): 3.7 Maximum Distance 1 (cm): Yes N/A N/A Undermining: Category/Stage IV N/A N/A Classification: Medium N/A N/A Exudate A mount: Serosanguineous N/A N/A Exudate Type: red, brown N/A N/A Exudate Color: Well defined,  not attached N/A N/A Wound Margin: Large (67-100%) N/A N/A Granulation Amount: Red, Pink N/A N/A Granulation Quality: Small (1-33%) N/A N/A Necrotic Amount: Fat Layer (Subcutaneous Tissue): Yes N/A N/A Exposed Structures: Fascia: No Tendon: No Muscle: No Joint: No Bone: No Small (1-33%) N/A N/A Epithelialization: Debridement - Selective/Open Wound N/A N/A Debridement: Pre-procedure Verification/Time Out 14:50 N/A N/A Taken: Other N/A N/A Pain Control: Slough N/A N/A Tissue Debrided: Non-Viable Tissue N/A N/A Level: 2.89 N/A N/A Debridement A (sq cm): rea Curette N/A N/A Instrument: Minimum N/A N/A Bleeding: Pressure N/A N/A Hemostasis A chieved: 2 N/A N/A Procedural Pain: 0 N/A N/A Post Procedural Pain: Procedure was tolerated well N/A N/A Debridement Treatment Response: 1.7x1.7x1 N/A N/A Post Debridement Measurements L x W x D (cm) 2.27 N/A N/A Post Debridement Volume: (cm) Category/Stage IV N/A N/A Post Debridement Stage: Debridement N/A N/A Procedures Performed: Negative Pressure Wound Therapy Maintenance (NPWT) Treatment Notes Electronic Signature(s) Signed: 03/27/2022 3:00:01 PM By: Fredirick Maudlin MD FACS Signed: 03/28/2022 6:21:16 PM By: Baruch Gouty RN, BSN Entered By:  Fredirick Maudlin on 03/27/2022 15:00:01 -------------------------------------------------------------------------------- Multi-Disciplinary Care Plan Details Patient Name: Date of Service: [name], Stacy Cola B. 03/27/2022 2:00 PM Medical Record Number: [id number] Patient Account Number: [id number] Date of Birth/Sex: Treating RN: Feb 14, 1923 (88 y.o. Elam Dutch Primary Care Shireen Rayburn: [name] Other Clinician: Referring Shanele Nissan: Treating Murel Shenberger/Extender: Nicholaus Corolla in Treatment: 14 Multidisciplinary Care Plan reviewed with physician Active Inactive Pressure Nursing Diagnoses: Knowledge deficit related to causes and risk factors for pressure ulcer development Knowledge deficit related to management of pressures ulcers Potential for impaired tissue integrity related to pressure, friction, moisture, and shear Goals: Patient will remain free of pressure ulcers Date Initiated: 12/25/2021 Target Resolution Date: 04/06/2022 Goal Status: Active Patient/caregiver will verbalize understanding of pressure ulcer management Date Initiated: 12/25/2021 Target Resolution Date: 04/06/2022 Goal Status: Active Interventions: Assess: immobility, friction, shearing, incontinence upon admission and as needed Assess offloading mechanisms upon admission and as needed Notes: Wound/Skin Impairment Nursing Diagnoses: Impaired tissue integrity Knowledge deficit related to ulceration/compromised skin integrity Goals: Patient will have a decrease in wound volume by X% from date: (specify in notes) Date Initiated: 12/20/2021 Date Inactivated: 01/08/2022 Target Resolution Date: 01/12/2022 Goal Status: Unmet Unmet Reason: pressure relief Patient/caregiver will verbalize understanding of skin care regimen Date Initiated: 12/20/2021 Target Resolution Date: 04/06/2022 Goal Status: Active Ulcer/skin breakdown will have a volume reduction of 30% by week 4 Date Initiated:  12/20/2021 Target Resolution Date: 04/06/2022 Goal Status: Active Ulcer/skin breakdown will have a volume reduction of 50% by week 8 Date Initiated: 12/20/2021 Target Resolution Date: 04/06/2022 Goal Status: Active Interventions: Assess patient/caregiver ability to obtain necessary supplies Assess patient/caregiver ability to perform ulcer/skin care regimen upon admission and as needed Assess ulceration(s) every visit Notes: Electronic Signature(s) Signed: 03/28/2022 6:21:16 PM By: Baruch Gouty RN, BSN Entered By: Baruch Gouty on 03/27/2022 14:31:21 -------------------------------------------------------------------------------- Negative Pressure Wound Therapy Maintenance (NPWT) Details Patient Name: Date of Service: [name], [name] 03/27/2022 2:00 PM Medical Record Number: [id number] Patient Account Number: [id number] Date of Birth/Sex: Treating RN: March 24, 1923 (88 y.o. Elam Dutch Primary Care Leyani Gargus: [name] Other Clinician: Referring Kaena Santori: Treating Onnie Hatchel/Extender: Nicholaus Corolla in Treatment: 14 NPWT Maintenance Performed for: Wound #1 Thoracic spine Performed By: Baruch Gouty, RN Type: Other Coverage Size (sq cm): 2.89 Pressure Type: Constant Pressure Setting: 125 mmHG Drain Type: None Sponge/Dressing Type: Foam, Blue Date Initiated: 02/19/2022 Dressing Removed: No Quantity of Sponges/Gauze Removed: removed by family Canister  Changed: No Dressing Reapplied: No Quantity of Sponges/Gauze Inserted: 1 Respones T Treatment: o good Days On NPWT : 37 Post Procedure Diagnosis Same as Pre-procedure Electronic Signature(s) Signed: 03/28/2022 6:21:16 PM By: Baruch Gouty RN, BSN Entered By: Baruch Gouty on 03/27/2022 14:53:20 -------------------------------------------------------------------------------- Pain Assessment Details Patient Name: Date of Service: Stacy Like B. 03/27/2022 2:00 PM Medical Record  Number: [id number] Patient Account Number: [id number] Date of Birth/Sex: Treating RN: 1923/10/07 (88 y.o. Elam Dutch Primary Care Burnette Sautter: [name] Other Clinician: Referring Edrie Ehrich: Treating Sumayah Bearse/Extender: Nicholaus Corolla in Treatment: 14 Active Problems Location of Pain Severity and Description of Pain Patient Has Paino Yes Site Locations Pain Location: Generalized Pain, Pain in Ulcers With Dressing Change: Yes Duration of the Pain. Constant / Intermittento Intermittent Rate the pain. Current Pain Level: 0 Worst Pain Level: 5 Least Pain Level: 0 Character of Pain Describe the Pain: Aching Pain Management and Medication Current Pain Management: Medication: Yes Is the Current Pain Management Adequate: Adequate Rest: Yes How does your wound impact your activities of daily livingo Sleep: No Bathing: No Appetite: No Relationship With Others: No Bladder Continence: No Emotions: No Bowel Continence: No Work: No Toileting: No Drive: No Dressing: No Hobbies: No Notes pt reports pain in her back with movement Electronic Signature(s) Signed: 03/28/2022 6:21:16 PM By: Baruch Gouty RN, BSN Entered By: Baruch Gouty on 03/27/2022 14:20:28 -------------------------------------------------------------------------------- Patient/Caregiver Education Details Patient Name: Date of Service: [name] 6/20/2023andnbsp2:00 PM Medical Record Number: [id number] Patient Account Number: [id number] Date of Birth/Gender: Treating RN: 1923-08-03 (88 y.o. Elam Dutch Primary Care Physician: [name] Other Clinician: Referring Physician: Treating Physician/Extender: Nicholaus Corolla in Treatment: 14 Education Assessment Education Provided To: Patient Education Topics Provided Pressure: Methods: Explain/Verbal Responses: Reinforcements needed, State content correctly Wound/Skin  Impairment: Methods: Explain/Verbal Responses: Reinforcements needed, State content correctly Electronic Signature(s) Signed: 03/28/2022 6:21:16 PM By: Baruch Gouty RN, BSN Entered By: Baruch Gouty on 03/27/2022 14:33:02 -------------------------------------------------------------------------------- Wound Assessment Details Patient Name: Date of Service: Stacy Like B. 03/27/2022 2:00 PM Medical Record Number: [id number] Patient Account Number: [id number] Date of Birth/Sex: Treating RN: 08-22-1923 (88 y.o. Elam Dutch Primary Care Christionna Poland: [name] Other Clinician: Referring Dayanne Yiu: Treating Lawrencia Mauney/Extender: Nicholaus Corolla in Treatment: 14 Wound Status Wound Number: 1 Primary Etiology: Pressure Ulcer Wound Location: Thoracic spine Wound Status: Open Wounding Event: Gradually Appeared Comorbid History: Hypertension, Type II Diabetes Date Acquired: 12/19/2020 Weeks Of Treatment: 14 Clustered Wound: No Photos Wound Measurements Length: (cm) 1.7 Width: (cm) 1.7 Depth: (cm) 1 Area: (cm) 2.27 Volume: (cm) 2.27 Wound Description Classification: Category/Stage IV Wound Margin: Well defined, not attached Exudate Amount: Medium Exudate Type: Serosanguineous Exudate Color: red, brown Foul Odor After Cleansing: Slough/Fibrino % Reduction in Area: 34.3% % Reduction in Volume: 56.2% Epithelialization: Small (1-33%) Tunneling: No Undermining: Yes Starting Position (o'clock): 12 Ending Position (o'clock): 4 Maximum Distance: (cm) 3.7 No Yes Wound Bed Granulation Amount: Large (67-100%) Exposed Structure Granulation Quality: Red, Pink Fascia Exposed: No Necrotic Amount: Small (1-33%) Fat Layer (Subcutaneous Tissue) Exposed: Yes Necrotic Quality: Adherent Slough Tendon Exposed: No Muscle Exposed: No Joint Exposed: No Bone Exposed: No Treatment Notes Wound #1 (Thoracic spine) Cleanser Wound Cleanser Discharge  Instruction: Cleanse the wound with wound cleanser prior to applying a clean dressing using gauze sponges, not tissue or cotton balls. Peri-Wound Care Skin Prep Discharge Instruction: Use skin prep as directed Topical Primary Dressing Promogran Prisma Matrix, 4.34 (sq in) (silver collagen) Discharge  Instruction: Moisten collagen with saline or hydrogel SNAP VAC Secondary Dressing Secured With Compression Wrap Compression Stockings Add-Ons Electronic Signature(s) Signed: 03/28/2022 6:21:16 PM By: Baruch Gouty RN, BSN Entered By: Baruch Gouty on 03/27/2022 14:28:48 -------------------------------------------------------------------------------- [place] Details Patient Name: Date of Service: Stacy Like B. 03/27/2022 2:00 PM Medical Record Number: [id number] Patient Account Number: [id number] Date of Birth/Sex: Treating RN: 1923-08-19 (88 y.o. Martyn Malay, [name] Primary Care Connor Foxworthy: [name] Other Clinician: Referring Lundon Rosier: Treating Felma Pfefferle/Extender: Nicholaus Corolla in Treatment: 14 Vital Signs Time Taken: 14:19 Temperature (F): 97.5 Height (in): 62 Pulse (bpm): 83 Source: Stated Respiratory Rate (breaths/min): 18 Weight (lbs): 110 Blood Pressure (mmHg): 119/71 Source: Stated Reference Range: 80 - 120 mg / dl Body Mass Index (BMI): 20.1 Electronic Signature(s) Signed: 03/28/2022 6:21:16 PM By: Baruch Gouty RN, BSN Entered By: Baruch Gouty on 03/27/2022 14:19:41

## 2022-04-03 ENCOUNTER — Encounter (HOSPITAL_BASED_OUTPATIENT_CLINIC_OR_DEPARTMENT_OTHER): Payer: Medicare Other | Admitting: General Surgery

## 2022-04-03 DIAGNOSIS — L89104 Pressure ulcer of unspecified part of back, stage 4: Secondary | ICD-10-CM | POA: Diagnosis not present

## 2022-04-11 ENCOUNTER — Encounter (HOSPITAL_BASED_OUTPATIENT_CLINIC_OR_DEPARTMENT_OTHER): Payer: Medicare Other | Attending: General Surgery | Admitting: General Surgery

## 2022-04-11 DIAGNOSIS — Z8781 Personal history of (healed) traumatic fracture: Secondary | ICD-10-CM | POA: Diagnosis not present

## 2022-04-11 DIAGNOSIS — L89104 Pressure ulcer of unspecified part of back, stage 4: Secondary | ICD-10-CM | POA: Insufficient documentation

## 2022-04-11 DIAGNOSIS — M40209 Unspecified kyphosis, site unspecified: Secondary | ICD-10-CM | POA: Diagnosis not present

## 2022-04-11 DIAGNOSIS — E119 Type 2 diabetes mellitus without complications: Secondary | ICD-10-CM | POA: Diagnosis not present

## 2022-04-11 NOTE — Progress Notes (Signed)
[name], [name] ([id number]) Visit Report for 04/11/2022 Arrival Information Details Patient Name: Date of Service: [name], [name] 04/11/2022 11:15 A M Medical Record Number: [id number] Patient Account Number: [id number] Date of Birth/Sex: Treating RN: June 04, 1923 (88 y.o. [name] Primary Care Krystiana Fornes: [name] Other Clinician: Referring Kamil Mchaffie: Treating Arcadio Cope/Extender: Nicholaus Corolla in Treatment: 69 Visit Information History Since Last Visit Added or deleted any medications: No Patient Arrived: Wheel Chair Any new allergies or adverse reactions: No Arrival Time: 11:15 Had a fall or experienced change in No Accompanied By: family activities of daily living that may affect Transfer Assistance: Manual risk of falls: Patient Identification Verified: Yes Signs or symptoms of abuse/neglect since last visito No Patient Requires Transmission-Based Precautions: No Hospitalized since last visit: No Patient Has Alerts: No Implantable device outside of the clinic excluding No cellular tissue based products placed in the center since last visit: Has Dressing in Place as Prescribed: Yes Pain Present Now: No Electronic Signature(s) Signed: 04/11/2022 1:09:53 PM By: Dellie Catholic RN Entered By: Dellie Catholic on 04/11/2022 11:44:11 -------------------------------------------------------------------------------- Encounter Discharge Information Details Patient Name: Date of Service: Stacy Like B. 04/11/2022 11:15 A M Medical Record Number: [id number] Patient Account Number: [id number] Date of Birth/Sex: Treating RN: 05/28/1923 (88 y.o. [name] Primary Care Leatrice Parilla: [name] Other Clinician: Referring Sarika Baldini: Treating Tahirih Lair/Extender: Nicholaus Corolla in Treatment: 16 Encounter Discharge Information Items Discharge Condition: Stable Ambulatory Status: Wheelchair Discharge Destination:  Home Transportation: Private Auto Accompanied By: daughter and son in law Schedule Follow-up Appointment: Yes Clinical Summary of Care: Patient Declined Electronic Signature(s) Signed: 04/11/2022 5:32:47 PM By: Baruch Gouty RN, BSN Entered By: Baruch Gouty on 04/11/2022 12:23:21 -------------------------------------------------------------------------------- Lower Extremity Assessment Details Patient Name: Date of Service: Stacy DONETTE, [name] 04/11/2022 11:15 A M Medical Record Number: [id number] Patient Account Number: [id number] Date of Birth/Sex: Treating RN: Jun 23, 1923 (88 y.o. [name] Primary Care Tyrek Lawhorn: [name] Other Clinician: Referring Forrester Blando: Treating Sharell Hilmer/Extender: Nicholaus Corolla in Treatment: 16 Electronic Signature(s) Signed: 04/11/2022 1:09:53 PM By: Dellie Catholic RN Entered By: Dellie Catholic on 04/11/2022 11:17:11 -------------------------------------------------------------------------------- Multi Wound Chart Details Patient Name: Date of Service: Stacy Like B. 04/11/2022 11:15 A M Medical Record Number: [id number] Patient Account Number: [id number] Date of Birth/Sex: Treating RN: [birth year] (88 y.o. [name] Primary Care Hulda Reddix: [name] Other Clinician: Referring Sherman Donaldson: Treating Jane Broughton/Extender: Nicholaus Corolla in Treatment: 16 Vital Signs Height(in): 62 Pulse(bpm): 46 Weight(lbs): 110 Blood Pressure(mmHg): 113/75 Body Mass Index(BMI): 20.1 Temperature(F): 98.1 Respiratory Rate(breaths/min): 16 Photos: [N/A:N/A] Thoracic spine N/A N/A Wound Location: Gradually Appeared N/A N/A Wounding Event: Pressure Ulcer N/A N/A Primary Etiology: Hypertension, Type II Diabetes N/A N/A Comorbid History: 12/19/2020 N/A N/A Date Acquired: 16 N/A N/A Weeks of Treatment: Open N/A N/A Wound Status: No N/A N/A Wound Recurrence: 1.7x1.6x0.5 N/A  N/A Measurements L x W x D (cm) 2.136 N/A N/A A (cm) : rea 1.068 N/A N/A Volume (cm) : 38.20% N/A N/A % Reduction in A rea: 79.40% N/A N/A % Reduction in Volume: 11 Starting Position 1 (o'clock): 4 Ending Position 1 (o'clock): 3.7 Maximum Distance 1 (cm): Yes N/A N/A Undermining: Category/Stage IV N/A N/A Classification: Medium N/A N/A Exudate A mount: Serosanguineous N/A N/A Exudate Type: red, brown N/A N/A Exudate Color: Well defined, not attached N/A N/A Wound Margin: Large (67-100%) N/A N/A Granulation Amount: Red, Pink N/A N/A Granulation Quality: Small (1-33%) N/A N/A Necrotic Amount: Fat Layer (  Subcutaneous Tissue): Yes N/A N/A Exposed Structures: Fascia: No Tendon: No Muscle: No Joint: No Bone: No Small (1-33%) N/A N/A Epithelialization: Negative Pressure Wound Therapy N/A N/A Procedures Performed: Maintenance (NPWT) Treatment Notes Electronic Signature(s) Signed: 04/11/2022 12:17:47 PM By: Fredirick Maudlin MD FACS Signed: 04/11/2022 5:32:47 PM By: Baruch Gouty RN, BSN Entered By: Fredirick Maudlin on 04/11/2022 12:17:47 -------------------------------------------------------------------------------- Multi-Disciplinary Care Plan Details Patient Name: Date of Service: Stacy Like B. 04/11/2022 11:15 A M Medical Record Number: [id number] Patient Account Number: [id number] Date of Birth/Sex: Treating RN: 01-11-1923 (88 y.o. [name] Primary Care Eilyn Polack: [name] Other Clinician: Referring Kahealani Yankovich: Treating Marlis Oldaker/Extender: Nicholaus Corolla in Treatment: 16 Multidisciplinary Care Plan reviewed with physician Active Inactive Pressure Nursing Diagnoses: Knowledge deficit related to causes and risk factors for pressure ulcer development Knowledge deficit related to management of pressures ulcers Potential for impaired tissue integrity related to pressure, friction, moisture, and shear Goals: Patient  will remain free of pressure ulcers Date Initiated: 12/25/2021 Date Inactivated: 04/11/2022 Target Resolution Date: 04/06/2022 Goal Status: Met Patient/caregiver will verbalize understanding of pressure ulcer management Date Initiated: 12/25/2021 Target Resolution Date: 05/04/2022 Goal Status: Active Interventions: Assess: immobility, friction, shearing, incontinence upon admission and as needed Assess offloading mechanisms upon admission and as needed Notes: Wound/Skin Impairment Nursing Diagnoses: Impaired tissue integrity Knowledge deficit related to ulceration/compromised skin integrity Goals: Patient will have a decrease in wound volume by X% from date: (specify in notes) Date Initiated: 12/20/2021 Date Inactivated: 01/08/2022 Target Resolution Date: 01/12/2022 Goal Status: Unmet Unmet Reason: pressure relief Patient/caregiver will verbalize understanding of skin care regimen Date Initiated: 12/20/2021 Target Resolution Date: 05/04/2022 Goal Status: Active Ulcer/skin breakdown will have a volume reduction of 30% by week 4 Date Initiated: 12/20/2021 Date Inactivated: 04/11/2022 Target Resolution Date: 04/06/2022 Goal Status: Unmet Unmet Reason: infection, osteo Ulcer/skin breakdown will have a volume reduction of 50% by week 8 Date Initiated: 12/20/2021 Date Inactivated: 04/11/2022 Target Resolution Date: 04/06/2022 Unmet Reason: osteo, severe Goal Status: Unmet kyphosis Interventions: Assess patient/caregiver ability to obtain necessary supplies Assess patient/caregiver ability to perform ulcer/skin care regimen upon admission and as needed Assess ulceration(s) every visit Notes: Electronic Signature(s) Signed: 04/11/2022 5:32:47 PM By: Baruch Gouty RN, BSN Entered By: Baruch Gouty on 04/11/2022 11:48:21 -------------------------------------------------------------------------------- Negative Pressure Wound Therapy Maintenance (NPWT) Details Patient Name: Date of Service: JOCIE, [name] 04/11/2022 11:15 A M Medical Record Number: [id number] Patient Account Number: [id number] Date of Birth/Sex: Treating RN: 03/01/1923 (88 y.o. [name] Primary Care Harutyun Monteverde: [name] Other Clinician: Referring Larraine Argo: Treating July Nickson/Extender: Nicholaus Corolla in Treatment: 16 NPWT Maintenance Performed for: Wound #1 Thoracic spine Performed By: Baruch Gouty, RN Type: Other Coverage Size (sq cm): 2.72 Pressure Type: Constant Pressure Setting: 125 mmHG Drain Type: None Sponge/Dressing Type: Foam, Black Date Initiated: 02/19/2022 Dressing Removed: No Quantity of Sponges/Gauze Removed: 1 Canister Changed: No Dressing Reapplied: No Quantity of Sponges/Gauze Inserted: 1 Respones T Treatment: o good Days On NPWT : 52 Post Procedure Diagnosis Same as Pre-procedure Notes change to conventional VAC Electronic Signature(s) Signed: 04/11/2022 5:32:47 PM By: Baruch Gouty RN, BSN Entered By: Baruch Gouty on 04/11/2022 11:50:22 -------------------------------------------------------------------------------- Pain Assessment Details Patient Name: Date of Service: Stacy Like B. 04/11/2022 11:15 A M Medical Record Number: [id number] Patient Account Number: [id number] Date of Birth/Sex: Treating RN: [birth year] (88 y.o. [name] Primary Care Chalonda Schlatter: [name] Other Clinician: Referring Tadarrius Burch: Treating Welden Hausmann/Extender: Nicholaus Corolla in Treatment: 58 Active Problems  Location of Pain Severity and Description of Pain Patient Has Paino No Site Locations Pain Management and Medication Current Pain Management: Electronic Signature(s) Signed: 04/11/2022 1:09:53 PM By: Dellie Catholic RN Entered By: Dellie Catholic on 04/11/2022 11:17:04 -------------------------------------------------------------------------------- Patient/Caregiver Education Details Patient Name: Date of  Service: Marijean Heath 7/5/2023andnbsp11:15 A M Medical Record Number: [id number] Patient Account Number: [id number] Date of Birth/Gender: Treating RN: [birth year] (88 y.o. [name] Primary Care Physician: [name] Other Clinician: Referring Physician: Treating Physician/Extender: Nicholaus Corolla in Treatment: 16 Education Assessment Education Provided To: Patient Education Topics Provided Pressure: Methods: Explain/Verbal Responses: Reinforcements needed, State content correctly Wound/Skin Impairment: Methods: Explain/Verbal Responses: Reinforcements needed, State content correctly Notes extensive teaching and demonstration with daughter and son in law in how to change and apply VAC dressing Electronic Signature(s) Signed: 04/11/2022 5:32:47 PM By: Baruch Gouty RN, BSN Signed: 04/11/2022 5:32:47 PM By: Baruch Gouty RN, BSN Entered By: Baruch Gouty on 04/11/2022 13:25:38 -------------------------------------------------------------------------------- Wound Assessment Details Patient Name: Date of Service: Stacy Like B. 04/11/2022 11:15 A M Medical Record Number: [id number] Patient Account Number: [id number] Date of Birth/Sex: Treating RN: 1923-07-29 (88 y.o. [name] Primary Care Lumi Winslett: [name] Other Clinician: Referring [name] Henrichs: Treating Righteous Claiborne/Extender: Nicholaus Corolla in Treatment: 16 Wound Status Wound Number: 1 Primary Etiology: Pressure Ulcer Wound Location: Thoracic spine Wound Status: Open Wounding Event: Gradually Appeared Comorbid History: Hypertension, Type II Diabetes Date Acquired: 12/19/2020 Weeks Of Treatment: 16 Clustered Wound: No Photos Wound Measurements Length: (cm) 1.7 Width: (cm) 1.6 Depth: (cm) 0.5 Area: (cm) 2.136 Volume: (cm) 1.068 % Reduction in Area: 38.2% % Reduction in Volume: 79.4% Epithelialization: Small (1-33%) Tunneling:  No Undermining: Yes Starting Position (o'clock): 11 Ending Position (o'clock): 4 Maximum Distance: (cm) 3.7 Wound Description Classification: Category/Stage IV Wound Margin: Well defined, not attached Exudate Amount: Medium Exudate Type: Serosanguineous Exudate Color: red, brown Foul Odor After Cleansing: No Slough/Fibrino Yes Wound Bed Granulation Amount: Large (67-100%) Exposed Structure Granulation Quality: Red, Pink Fascia Exposed: No Necrotic Amount: Small (1-33%) Fat Layer (Subcutaneous Tissue) Exposed: Yes Necrotic Quality: Adherent Slough Tendon Exposed: No Muscle Exposed: No Joint Exposed: No Bone Exposed: No Treatment Notes Wound #1 (Thoracic spine) Cleanser Wound Cleanser Discharge Instruction: Cleanse the wound with wound cleanser prior to applying a clean dressing using gauze sponges, not tissue or cotton balls. Peri-Wound Care Skin Prep Discharge Instruction: Use skin prep as directed Topical Primary Dressing Promogran Prisma Matrix, 4.34 (sq in) (silver collagen) Discharge Instruction: Moisten collagen with saline or hydrogel VAC Secondary Dressing Secured With Compression Wrap Compression Stockings Add-Ons Electronic Signature(s) Signed: 04/11/2022 1:09:53 PM By: Dellie Catholic RN Entered By: Dellie Catholic on 04/11/2022 11:12:37 -------------------------------------------------------------------------------- [place] Details Patient Name: Date of Service: Stacy Like B. 04/11/2022 11:15 A M Medical Record Number: [id number] Patient Account Number: [id number] Date of Birth/Sex: Treating RN: 03-31-1923 (88 y.o. [name] Primary Care Meridian Scherger: [name] Other Clinician: Referring Baylee Mccorkel: Treating Delcia Spitzley/Extender: Nicholaus Corolla in Treatment: 16 Vital Signs Time Taken: 11:05 Temperature (F): 98.1 Height (in): 62 Pulse (bpm): 73 Weight (lbs): 110 Respiratory Rate (breaths/min): 16 Body Mass Index  (BMI): 20.1 Blood Pressure (mmHg): 113/75 Reference Range: 80 - 120 mg / dl Electronic Signature(s) Signed: 04/11/2022 1:09:53 PM By: Dellie Catholic RN Entered By: Dellie Catholic on 04/11/2022 11:16:56

## 2022-04-11 NOTE — Progress Notes (Signed)
[name], [name] ([id number]) Visit Report for 04/11/2022 Chief Complaint Document Details Patient Name: Date of Service: [name] 04/11/2022 11:15 A M Medical Record Number: [id number] Patient Account Number: [id number] Date of Birth/Sex: Treating RN: May 06, 1923 (88 y.o. [name] Primary Care Provider: [name] Other Clinician: Referring Provider: Treating Provider/Extender: Nicholaus Corolla in Treatment: 16 Information Obtained from: Patient Chief Complaint Patient is at the clinic for treatment of an open pressure ulcer Electronic Signature(s) Signed: 04/11/2022 12:17:52 PM By: Fredirick Maudlin MD FACS Entered By: Fredirick Maudlin on 04/11/2022 12:17:52 -------------------------------------------------------------------------------- HPI Details Patient Name: Date of Service: Stacy Like B. 04/11/2022 11:15 A M Medical Record Number: [id number] Patient Account Number: [id number] Date of Birth/Sex: Treating RN: 06-Feb-1923 (88 y.o. [name] Primary Care Provider: [name] Other Clinician: Referring Provider: Treating Provider/Extender: Nicholaus Corolla in Treatment: 16 History of Present Illness HPI Description: ADMISSION 12/19/21 This is a 88 year old woman who is presenting to clinic today with a an open ulcer over her thoracic spine. She has severe kyphosis and a history of prior T12 fracture, as well as type 2 diabetes mellitus. The wound has been present for about a year. She is accompanied by her daughters, 1 of whom is a Marine scientist. They have been trying to offload the site by using pillows and supports to keep returned while in bed, and an eggcrate foam with a cut out for the wound for when she is sitting up. They have been applying Santyl to the site. She does have home health and the provider took a culture which was positive for Pseudomonas. She was on ciprofloxacin for this. A recent repeat  culture was negative. Most recent hemoglobin A1c was 6.5, in February. A C-reactive protein also obtained in February was elevated at 24.1. No imaging has been performed of the site. The patient denies significant pain. 12/25/2021: Last week, I took a bone biopsy as well as culture. Fortunately, the bone biopsy was negative for osteomyelitis. The culture returned with rare corynebacterium and rare Enterococcus faecalis. We have been using Santyl and Hydrofera Blue. T oday, the wound appears cleaner and there are buds of granulation tissue forming around the perimeter. 01/01/2022: Due to the positive culture, we switched to topical gentamicin. Today, she has had a little bit of a slough accumulation. The wound continues to have buds of granulation tissue forming. Apparently, the Hydrofera Blue was placed just over the wound opening rather than down in to contact the surface. No purulent drainage or odor. 01/08/2022: Last week, there was more slough in the wound bed, so I changed back to Santyl. There is now hypertrophic granulation tissue around the wound opening. Once again, we seem to be having some difficulty getting the Hydrofera Blue tucked up into the wound; there is undermining that extends for 3 to 4 cm at the 12 o'clock position and the Hydrofera Blue is simply sitting on top of the wound without being in contact with the surface. Bone is still palpable, but tissue is beginning to close in over it. 01/22/2022: The wound looks much cleaner this week and the hypertrophic granulation tissue responded appropriately to silver nitrate application. She continues to have undermining for about 4 cm from 12:00 to 4:00. I can still feel the bone in the base at about the 2 o'clock position, but it is less prominent and tissue is closing over. We have been using Santyl and Hydrofera Blue. The daughter that accompanies the patient today  indicates that they have been very diligent in making sure the [facility] [place] is tucked under and into the full base of the wound. 02/05/2022: The wound overall is improved. The undermining is about the same but there is less bone palpable. We have been using Santyl and Hydrofera Blue. Minimal slough accumulation in the 2 weeks since her last visit. 02/19/2022: The visible wound surface is very clean without any slough accumulation. Although I can feel bone in the undermined portion of the wound, it is now covered with a layer of tissue. The undermined area is about the same size, however. We have been using Santyl with Hydrofera Blue. She has been approved for snap VAC use. 02/26/2022: Last week, we applied a snap VAC. The wound dimensions have come in by a bit in all directions. The VAC canister did fill, however, and was starting to leak so the patient's daughter removed the VAC and dressed the wound as we had been previously. There is a little bit of thin slough on the wound surface with some senescent tissue at the inferior margin. Bone is still palpable but covered with a layer of tissue. No odor. 03/06/2022: We continue to have technical difficulties with the snap VAC which resulted in the patient's family removing it and going back to the dressing changes with Hydrofera Blue. It sounds like there are issues maintaining suction and the drape is getting bunched up creating a leak. The wound measured larger today, but the intake nurse thinks that perhaps her measurements were taken slightly differently than last week's. There is just some thin slough on the wound surface as well as some hypertrophic granulation tissue on the lateral border. 03/13/2022: The snap VAC worked much better this week and only began leaking yesterday. At that point, the patient's family removed it and applied Hydrofera Blue to her wound. Today, the tissue coverage over the bone that had been exposed is more substantial. She still has undermining present. There is also some senescent skin around the  wound orifice along with some hypertrophic granulation tissue. 03/20/2022: The snap VAC unfortunately failed on Friday. There has really been no significant change to the wound for several weeks. The degree of undermining is unchanged. The surface is clean. 03/27/2022: Once again, the snap VAC failed prematurely and had to be removed on Saturday. The undermining remains the same. There is more tissue coverage of the previously exposed bone. The wound surface has just a light layer of biofilm and thin slough. She was approved for a standard wound VAC, but there are currently no home health agencies able to staff dressing changes for her; it is too difficult for her and her family to bring her to the wound center more than once a week. 04/03/2022: The snap VAC stayed on but the canister filled up on Sunday. The direct depth of the wound is a little bit shallower but the undermining has not really changed. There is some slightly discolored, darker tissue at the 9 o'clock position. No odor or purulent drainage. 04/11/2022: Using 2 canisters worked well and the snap VAC lasted until yesterday. She has her standard wound VAC with her today. The orifice of the wound has contracted somewhat. There is still substantial undermining at 12:00, but the undermined portion to the right between 2 and 4:00 has closed and somewhat. The previously exposed bone is no longer even palpable under the overlying tissue. No concern for infection. Electronic Signature(s) Signed: 04/11/2022 12:19:49 PM By: Fredirick Maudlin MD FACS  Entered By: Fredirick Maudlin on 04/11/2022 12:19:48 -------------------------------------------------------------------------------- Physical Exam Details Patient Name: Date of Service: Stacy, Perry 04/11/2022 11:15 A M Medical Record Number: [id number] Patient Account Number: [id number] Date of Birth/Sex: Treating RN: Dec 16, 1922 (88 y.o. [name] Primary Care Provider: [name] Other  Clinician: Referring Provider: Treating Provider/Extender: Nicholaus Corolla in Treatment: 16 Constitutional . . . . No acute distress.. Notes 04/11/2022: The orifice of the wound has contracted somewhat. There is still substantial undermining at 12:00, but the undermined portion to the right between 2 and 4:00 has closed and somewhat. The previously exposed bone is no longer even palpable under the overlying tissue. No concern for infection. Electronic Signature(s) Signed: 04/11/2022 12:20:26 PM By: Fredirick Maudlin MD FACS Entered By: Fredirick Maudlin on 04/11/2022 12:20:25 -------------------------------------------------------------------------------- Physician Orders Details Patient Name: Date of Service: Stacy Like B. 04/11/2022 11:15 A M Medical Record Number: [id number] Patient Account Number: [id number] Date of Birth/Sex: Treating RN: 1923/06/08 (88 y.o. [name] Primary Care Provider: [name] Other Clinician: Referring Provider: Treating Provider/Extender: Nicholaus Corolla in Treatment: [phone] Verbal / Phone Orders: No Diagnosis Coding ICD-10 Coding Code Description L89.104 Pressure ulcer of unspecified part of back, stage 4 E11.622 Type 2 diabetes mellitus with other skin ulcer S22.089S Unspecified fracture of T11-T12 vertebra, sequela Follow-up Appointments ppointment in 1 week. - Dr Celine Ahr - Room 1 - with Vaughan Basta Return A Tuesday 7/11 @ 10:30 am Bathing/ Shower/ Hygiene May shower with protection but do not get wound dressing(s) wet. Negative Presssure Wound Therapy Wound Vac to wound continuously at 146m/hg pressure Black Foam Off-Loading Turn and reposition every 2 hours Other: - KEEP PRESSURE OFF OF BACK MAY USE EGG CRATE CUSHION Wound Treatment Wound #1 - Thoracic spine Cleanser: Wound Cleanser (Generic) 1 x Per Week/30 Days Discharge Instructions: Cleanse the wound with wound cleanser prior to  applying a clean dressing using gauze sponges, not tissue or cotton balls. Peri-Wound Care: Skin Prep 1 x Per Week/30 Days Discharge Instructions: Use skin prep as directed Prim Dressing: Promogran Prisma Matrix, 4.34 (sq in) (silver collagen) 1 x Per Week/30 Days ary Discharge Instructions: Moisten collagen with saline or hydrogel Prim Dressing: VAC ary 1 x Per Week/30 Days Electronic Signature(s) Signed: 04/11/2022 12:22:20 PM By: CFredirick MaudlinMD FACS Entered By: CFredirick Maudlinon 04/11/2022 12:20:33 -------------------------------------------------------------------------------- Problem List Details Patient Name: Date of Service: HJim LikeB. 04/11/2022 11:15 A M Medical Record Number: [id number] Account Number: [id number] of Birth/Sex: Treating RN: [id number](88y.o. FElam DutchPrimary Care Provider: RJanace LittenOther Clinician: Referring Provider: Treating Provider/Extender: CNicholaus Corollain Treatment: 16 Active Problems ICD-10 Encounter Code Description Active Date MDM Diagnosis L89.104 Pressure ulcer of unspecified part of back, stage 4 12/19/2021 No Yes E11.622 Type 2 diabetes mellitus with other skin ulcer 12/19/2021 No Yes S22.089S Unspecified fracture of T11-T12 vertebra, sequela 12/19/2021 No Yes Inactive Problems Resolved Problems Electronic Signature(s) Signed: 04/11/2022 12:17:42 PM By: CFredirick MaudlinMD FACS Entered By: CFredirick Maudlinon 04/11/2022 12:17:42 -------------------------------------------------------------------------------- Progress Note Details Patient Name: Date of Service: HJim LikeB. 04/11/2022 11:15 A M Medical Record Number: [id number] Account Number: [id number] of Birth/Sex: Treating RN: [id number](88y.o. FElam DutchPrimary Care Provider: RJanace LittenOther Clinician: Referring Provider: Treating Provider/Extender: CNicholaus Corollain Treatment: 16 Subjective Chief Complaint Information obtained from Patient Patient is at the clinic for treatment of an open  pressure ulcer History of Present Illness (HPI) ADMISSION 12/19/21 This is a 88 year old woman who is presenting to clinic today with a an open ulcer over her thoracic spine. She has severe kyphosis and a history of prior T12 fracture, as well as type 2 diabetes mellitus. The wound has been present for about a year. She is accompanied by her daughters, 1 of whom is a Marine scientist. They have been trying to offload the site by using pillows and supports to keep returned while in bed, and an eggcrate foam with a cut out for the wound for when she is sitting up. They have been applying Santyl to the site. She does have home health and the provider took a culture which was positive for Pseudomonas. She was on ciprofloxacin for this. A recent repeat culture was negative. Most recent hemoglobin A1c was 6.5, in February. A C-reactive protein also obtained in February was elevated at 24.1. No imaging has been performed of the site. The patient denies significant pain. 12/25/2021: Last week, I took a bone biopsy as well as culture. Fortunately, the bone biopsy was negative for osteomyelitis. The culture returned with rare corynebacterium and rare Enterococcus faecalis. We have been using Santyl and Hydrofera Blue. T oday, the wound appears cleaner and there are buds of granulation tissue forming around the perimeter. 01/01/2022: Due to the positive culture, we switched to topical gentamicin. Today, she has had a little bit of a slough accumulation. The wound continues to have buds of granulation tissue forming. Apparently, the Hydrofera Blue was placed just over the wound opening rather than down in to contact the surface. No purulent drainage or odor. 01/08/2022: Last week, there was more slough in the wound bed, so I changed back to Santyl. There is now hypertrophic  granulation tissue around the wound opening. Once again, we seem to be having some difficulty getting the Hydrofera Blue tucked up into the wound; there is undermining that extends for 3 to 4 cm at the 12 o'clock position and the Hydrofera Blue is simply sitting on top of the wound without being in contact with the surface. Bone is still palpable, but tissue is beginning to close in over it. 01/22/2022: The wound looks much cleaner this week and the hypertrophic granulation tissue responded appropriately to silver nitrate application. She continues to have undermining for about 4 cm from 12:00 to 4:00. I can still feel the bone in the base at about the 2 o'clock position, but it is less prominent and tissue is closing over. We have been using Santyl and Hydrofera Blue. The daughter that accompanies the patient today indicates that they have been very diligent in making sure the [facility] is tucked under and into the full base of the wound. 02/05/2022: The wound overall is improved. The undermining is about the same but there is less bone palpable. We have been using Santyl and Hydrofera Blue. Minimal slough accumulation in the 2 weeks since her last visit. 02/19/2022: The visible wound surface is very clean without any slough accumulation. Although I can feel bone in the undermined portion of the wound, it is now covered with a layer of tissue. The undermined area is about the same size, however. We have been using Santyl with Hydrofera Blue. She has been approved for snap VAC use. 02/26/2022: Last week, we applied a snap VAC. The wound dimensions have come in by a bit in all directions. The VAC canister did fill, however, and was starting to leak so the  patient's daughter removed the VAC and dressed the wound as we had been previously. There is a little bit of thin slough on the wound surface with some senescent tissue at the inferior margin. Bone is still palpable but covered with a layer of tissue.  No odor. 03/06/2022: We continue to have technical difficulties with the snap VAC which resulted in the patient's family removing it and going back to the dressing changes with Hydrofera Blue. It sounds like there are issues maintaining suction and the drape is getting bunched up creating a leak. The wound measured larger today, but the intake nurse thinks that perhaps her measurements were taken slightly differently than last week's. There is just some thin slough on the wound surface as well as some hypertrophic granulation tissue on the lateral border. 03/13/2022: The snap VAC worked much better this week and only began leaking yesterday. At that point, the patient's family removed it and applied Hydrofera Blue to her wound. Today, the tissue coverage over the bone that had been exposed is more substantial. She still has undermining present. There is also some senescent skin around the wound orifice along with some hypertrophic granulation tissue. 03/20/2022: The snap VAC unfortunately failed on Friday. There has really been no significant change to the wound for several weeks. The degree of undermining is unchanged. The surface is clean. 03/27/2022: Once again, the snap VAC failed prematurely and had to be removed on Saturday. The undermining remains the same. There is more tissue coverage of the previously exposed bone. The wound surface has just a light layer of biofilm and thin slough. She was approved for a standard wound VAC, but there are currently no home health agencies able to staff dressing changes for her; it is too difficult for her and her family to bring her to the wound center more than once a week. 04/03/2022: The snap VAC stayed on but the canister filled up on Sunday. The direct depth of the wound is a little bit shallower but the undermining has not really changed. There is some slightly discolored, darker tissue at the 9 o'clock position. No odor or purulent drainage. 04/11/2022:  Using 2 canisters worked well and the snap VAC lasted until yesterday. She has her standard wound VAC with her today. The orifice of the wound has contracted somewhat. There is still substantial undermining at 12:00, but the undermined portion to the right between 2 and 4:00 has closed and somewhat. The previously exposed bone is no longer even palpable under the overlying tissue. No concern for infection. Patient History Family History Unknown History. Social History Never smoker, Marital Status - Married, Alcohol Use - Never, Drug Use - No History, Caffeine Use - Rarely. Medical History Ear/Nose/Mouth/Throat Denies history of Chronic sinus problems/congestion, Middle ear problems Cardiovascular Patient has history of Hypertension Endocrine Patient has history of Type II Diabetes Immunological Denies history of Lupus Erythematosus, Raynaudoos, Scleroderma Oncologic Denies history of Received Chemotherapy, Received Radiation Hospitalization/Surgery History - t/11 t/12 FRACTURE/SURGERY. - FEMUR FX SURGERY. - TOTAL HYSTERECTOMY. - CHOLECYSTECTOMY. - TONSILLECTOMY. Medical A Surgical History Notes nd Ear/Nose/Mouth/Throat PARTIAL HEARING LEFT HEAR W/ HEARING AID TOTAL DEAFNESS RIGHT EAR Gastrointestinal Melena Endocrine Hypothyroidism Hyperlipidemia associated with type 2 diabetes mellitus (HCC) Musculoskeletal Closed T12 fracture (HCC) Hip fracture (HCC) Closed comminuted intertrochanteric fracture of proximal end of right femur (HCC) Objective Constitutional No acute distress.. Vitals Time Taken: 11:05 AM, Height: 62 in, Weight: 110 lbs, BMI: 20.1, Temperature: 98.1 F, Pulse: 73 bpm, Respiratory Rate: 16 breaths/min,  Blood Pressure: 113/75 mmHg. General Notes: 04/11/2022: The orifice of the wound has contracted somewhat. There is still substantial undermining at 12:00, but the undermined portion to the right between 2 and 4:00 has closed and somewhat. The previously exposed bone  is no longer even palpable under the overlying tissue. No concern for infection. Integumentary (Hair, Skin) Wound #1 status is Open. Original cause of wound was Gradually Appeared. The date acquired was: 12/19/2020. The wound has been in treatment 16 weeks. The wound is located on the Thoracic spine. The wound measures 1.7cm length x 1.6cm width x 0.5cm depth; 2.136cm^2 area and 1.068cm^3 volume. There is Fat Layer (Subcutaneous Tissue) exposed. There is no tunneling noted, however, there is undermining starting at 11:00 and ending at 4:00 with a maximum distance of 3.7cm. There is a medium amount of serosanguineous drainage noted. The wound margin is well defined and not attached to the wound base. There is large (67-100%) red, pink granulation within the wound bed. There is a small (1-33%) amount of necrotic tissue within the wound bed including Adherent Slough. Assessment Active Problems ICD-10 Pressure ulcer of unspecified part of back, stage 4 Type 2 diabetes mellitus with other skin ulcer Unspecified fracture of T11-T12 vertebra, sequela Plan Follow-up Appointments: Return Appointment in 1 week. - Dr Celine Ahr - Room 1 - with [facility] Tuesday 7/11 @ 10:30 am Bathing/ Shower/ Hygiene: May shower with protection but do not get wound dressing(s) wet. Negative Presssure Wound Therapy: Wound Vac to wound continuously at 161m/hg pressure Black Foam Off-Loading: Turn and reposition every 2 hours Other: - KEEP PRESSURE OFF OF BACK MAY USE EGG CRATE CUSHION WOUND #1: - Thoracic spine Wound Laterality: Cleanser: Wound Cleanser (Generic) 1 x Per Week/30 Days Discharge Instructions: Cleanse the wound with wound cleanser prior to applying a clean dressing using gauze sponges, not tissue or cotton balls. Peri-Wound Care: Skin Prep 1 x Per Week/30 Days Discharge Instructions: Use skin prep as directed Prim Dressing: Promogran Prisma Matrix, 4.34 (sq in) (silver collagen) 1 x Per Week/30  Days ary Discharge Instructions: Moisten collagen with saline or hydrogel Prim Dressing: VAC 1 x Per Week/30 Days ary 04/11/2022: The orifice of the wound has contracted somewhat. There is still substantial undermining at 12:00, but the undermined portion to the right between 2 and 4:00 has closed and somewhat. The previously exposed bone is no longer even palpable under the overlying tissue. No concern for infection. No debridement was necessary today. We will change over to a standard wound VAC. [facility] an [facility] for them and they are unable to bring her here more frequently than once a week. I am hopeful that the undermining will continue to close. Follow-up in 1 week. Electronic Signature(s) Signed: 04/11/2022 12:21:20 PM By: CFredirick MaudlinMD FACS Entered By: CFredirick Maudlinon 04/11/2022 12:21:20 -------------------------------------------------------------------------------- HxROS Details Patient Name: Date of Service: HJim LikeB. 04/11/2022 11:15 A M Medical Record Number: [id number] Account Number: [id number] of Birth/Sex: Treating RN: 702/17/1924(88y.o. FElam DutchPrimary Care Provider: RJanace LittenOther Clinician: Referring Provider: Treating Provider/Extender: CNicholaus Corollain Treatment: 16 Ear/Nose/Mouth/Throat Medical History: Negative for: Chronic sinus problems/congestion; Middle ear problems Past Medical History Notes: PARTIAL HEARING LEFT HEAR W/ HEARING AID TOTAL DEAFNESS RIGHT EAR Cardiovascular Medical History: Positive for: Hypertension Gastrointestinal Medical History: Past Medical History Notes: Melena Endocrine Medical History: Positive for: Type II Diabetes Past Medical  History Notes: Hypothyroidism Hyperlipidemia associated with type 2 diabetes mellitus ([place]) Treated with: Diet Immunological Medical History: Negative for:  Lupus Erythematosus; Raynauds; Scleroderma Musculoskeletal Medical History: Past Medical History Notes: Closed T12 fracture ([place]) Hip fracture ([place]) Closed comminuted intertrochanteric fracture of proximal end of right femur [facility]) Oncologic Medical History: Negative for: Received Chemotherapy; Received Radiation Immunizations Pneumococcal Vaccine: Received Pneumococcal Vaccination: Yes Received Pneumococcal Vaccination On or After 60th Birthday: No Implantable Devices None Hospitalization / Surgery History Type of Hospitalization/Surgery t/11 t/12 FRACTURE/SURGERY FEMUR FX SURGERY TOTAL HYSTERECTOMY CHOLECYSTECTOMY TONSILLECTOMY Family and Social History Unknown History: Yes; Never smoker; Marital Status - Married; Alcohol Use: Never; Drug Use: No History; Caffeine Use: Rarely; Financial Concerns: No; Food, Clothing or Shelter Needs: No; Support System Lacking: No; Transportation Concerns: No Engineer, maintenance) Signed: 04/11/2022 12:22:20 PM By: Fredirick Maudlin MD FACS Signed: 04/11/2022 5:32:47 PM By: Baruch Gouty RN, BSN Entered By: Fredirick Maudlin on 04/11/2022 12:19:55 -------------------------------------------------------------------------------- SuperBill Details Patient Name: Date of Service: [name] 04/11/2022 Medical Record Number: [id number] Patient Account Number: [id number] Date of Birth/Sex: Treating RN: 04/12/1923 (88 y.o. 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 other skin ulcer S22.089S Unspecified fracture of T11-T12 vertebra, sequela Facility Procedures CPT4 Code: [id number] Description: 67672 - WOUND VAC-50 SQ CM OR LESS Modifier: Quantity: 1 Physician  Procedures : CPT4 Code Description Modifier [id number] - WC PHYS LEVEL 4 - EST PT ICD-10 Diagnosis Description L89.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

## 2022-04-17 ENCOUNTER — Encounter (HOSPITAL_BASED_OUTPATIENT_CLINIC_OR_DEPARTMENT_OTHER): Payer: Medicare Other | Admitting: General Surgery

## 2022-04-17 DIAGNOSIS — L89104 Pressure ulcer of unspecified part of back, stage 4: Secondary | ICD-10-CM | POA: Diagnosis not present

## 2022-04-17 NOTE — Progress Notes (Signed)
[name] ([id number]) Visit Report for 04/17/2022 Arrival Information Details Patient Name: Date of Service: [name] 04/17/2022 10:30 A M Medical Record Number: [id number] Patient Account Number: [id number] Date of Birth/Sex: Treating RN: [birth year] (88 y.o. [name], [name] Primary Care Delbert Vu: [name] Other Clinician: Referring Caroleena Paolini: Treating Mirza Kidney/Extender: Nicholaus Corolla in Treatment: 8 Visit Information History Since Last Visit Added or deleted any medications: No Patient Arrived: Wheel Chair Any new allergies or adverse reactions: No Arrival Time: 10:55 Had a fall or experienced change in No Accompanied By: daughter and son in law activities of daily living that may affect Transfer Assistance: Manual risk of falls: Patient Requires Transmission-Based Precautions: No Signs or symptoms of abuse/neglect since last visito No Patient Has Alerts: No Hospitalized since last visit: No Implantable device outside of the clinic excluding No cellular tissue based products placed in the center since last visit: Has Dressing in Place as Prescribed: Yes Pain Present Now: No Electronic Signature(s) Signed: 04/17/2022 6:01:19 PM By: Baruch Gouty RN, BSN Entered By: Baruch Gouty on 04/17/2022 11:00:10 -------------------------------------------------------------------------------- Encounter Discharge Information Details Patient Name: Date of Service: Stacy Like B. 04/17/2022 10:30 A M Medical Record Number: [id number] Patient Account Number: [id number] Date of Birth/Sex: Treating RN: [birth year] (88 y.o. [name] Primary Care Palak Tercero: [name] Other Clinician: Referring Corin Formisano: Treating Abbigale Mcelhaney/Extender: Nicholaus Corolla in Treatment: 17 Encounter Discharge Information Items Post Procedure Vitals Discharge Condition: Stable Temperature (F): 98.5 Ambulatory Status:  Wheelchair Pulse (bpm): 101 Discharge Destination: Home Respiratory Rate (breaths/min): 18 Transportation: Private Auto Blood Pressure (mmHg): 102/63 Accompanied By: daughter Schedule Follow-up Appointment: Yes Clinical Summary of Care: Patient Declined Electronic Signature(s) Signed: 04/17/2022 6:01:19 PM By: Baruch Gouty RN, BSN Entered By: Baruch Gouty on 04/17/2022 11:41:31 -------------------------------------------------------------------------------- Lower Extremity Assessment Details Patient Name: Date of Service: [name]. 04/17/2022 10:30 A M Medical Record Number: [id number] Patient Account Number: [id number] Date of Birth/Sex: Treating RN: [name] (87 y.o. [name] Primary Care Chauncy Mangiaracina: [name] Other Clinician: Referring Khan Chura: Treating Oriana Horiuchi/Extender: Nicholaus Corolla in Treatment: 17 Electronic Signature(s) Signed: 04/17/2022 6:01:19 PM By: Baruch Gouty RN, BSN Entered By: Baruch Gouty on 04/17/2022 11:01:29 -------------------------------------------------------------------------------- Multi Wound Chart Details Patient Name: Date of Service: Stacy Server, Stacy Cola B. 04/17/2022 10:30 A M Medical Record Number: [id number] Patient Account Number: [id number] Date of Birth/Sex: Treating RN: [birth year] Perry (88 y.o. [name] Primary Care Keyri Salberg: [name] Other Clinician: Referring Reshonda Koerber: Treating Yarden Manuelito/Extender: Nicholaus Corolla in Treatment: 17 Vital Signs Height(in): 62 Pulse(bpm): 101 Weight(lbs): 110 Blood Pressure(mmHg): 102/63 Body Mass Index(BMI): 20.1 Temperature(F): 98.5 Respiratory Rate(breaths/min): 18 Photos: [N/A:N/A] Thoracic spine N/A N/A Wound Location: Gradually Appeared N/A N/A Wounding Event: Pressure Ulcer N/A N/A Primary Etiology: Hypertension, Type II Diabetes N/A N/A Comorbid History: 12/19/2020 N/A N/A Date Acquired: 17 N/A  N/A Weeks of Treatment: Open N/A N/A Wound Status: No N/A N/A Wound Recurrence: 1.4x1.3x0.9 N/A N/A Measurements L x W x D (cm) 1.429 N/A N/A A (cm) : rea 1.286 N/A N/A Volume (cm) : 58.70% N/A N/A % Reduction in A rea: 75.20% N/A N/A % Reduction in Volume: 11 Starting Position 1 (o'clock): 4 Ending Position 1 (o'clock): 4.5 Maximum Distance 1 (cm): Yes N/A N/A Undermining: Category/Stage IV N/A N/A Classification: Medium N/A N/A Exudate A mount: Serosanguineous N/A N/A Exudate Type: red, brown N/A N/A Exudate Color: Well defined, not attached N/A N/A Wound Margin: Large (67-100%) N/A N/A Granulation  Amount: Red, Pink N/A N/A Granulation Quality: Small (1-33%) N/A N/A Necrotic Amount: Fat Layer (Subcutaneous Tissue): Yes N/A N/A Exposed Structures: Fascia: No Tendon: No Muscle: No Joint: No Bone: No Small (1-33%) N/A N/A Epithelialization: Debridement - Excisional N/A N/A Debridement: Pre-procedure Verification/Time Out 11:15 N/A N/A Taken: Lidocaine 4% Topical Solution N/A N/A Pain Control: Subcutaneous, Slough N/A N/A Tissue Debrided: Skin/Subcutaneous Tissue N/A N/A Level: 1.82 N/A N/A Debridement A (sq cm): rea Curette N/A N/A Instrument: Minimum N/A N/A Bleeding: Pressure N/A N/A Hemostasis A chieved: 1 N/A N/A Procedural Pain: 0 N/A N/A Post Procedural Pain: Procedure was tolerated well N/A N/A Debridement Treatment Response: 1.4x1.3x0.9 N/A N/A Post Debridement Measurements L x W x D (cm) 1.286 N/A N/A Post Debridement Volume: (cm) Category/Stage IV N/A N/A Post Debridement Stage: Debridement N/A N/A Procedures Performed: Negative Pressure Wound Therapy Maintenance (NPWT) Treatment Notes Electronic Signature(s) Signed: 04/17/2022 11:27:47 AM By: Fredirick Maudlin MD FACS Signed: 04/17/2022 6:01:19 PM By: Baruch Gouty RN, BSN Entered By: Fredirick Maudlin on 04/17/2022  11:27:47 -------------------------------------------------------------------------------- Multi-Disciplinary Care Plan Details Patient Name: Date of Service: Stacy Server, Stacy Cola B. 04/17/2022 10:30 A M Medical Record Number: [id number] Patient Account Number: [id number] Date of Birth/Sex: Treating RN: [birth year] (88 y.o. [name] Primary Care Emalynn Clewis: [name] Other Clinician: Referring Perla Echavarria: Treating Platon Arocho/Extender: Nicholaus Corolla in Treatment: [place] reviewed with physician Active Inactive Pressure Nursing Diagnoses: Knowledge deficit related to causes and risk factors for pressure ulcer development Knowledge deficit related to management of pressures ulcers Potential for impaired tissue integrity related to pressure, friction, moisture, and shear Goals: Patient will remain free of pressure ulcers Date Initiated: 12/25/2021 Date Inactivated: 04/11/2022 Target Resolution Date: 04/06/2022 Goal Status: Met Patient/caregiver will verbalize understanding of pressure ulcer management Date Initiated: 12/25/2021 Target Resolution Date: 05/04/2022 Goal Status: Active Interventions: Assess: immobility, friction, shearing, incontinence upon admission and as needed Assess offloading mechanisms upon admission and as needed Notes: Wound/Skin Impairment Nursing Diagnoses: Impaired tissue integrity Knowledge deficit related to ulceration/compromised skin integrity Goals: Patient will have a decrease in wound volume by X% from date: (specify in notes) Date Initiated: 12/20/2021 Date Inactivated: 01/08/2022 Target Resolution Date: 01/12/2022 Goal Status: Unmet Unmet Reason: pressure relief Patient/caregiver will verbalize understanding of skin care regimen Date Initiated: 12/20/2021 Target Resolution Date: 05/04/2022 Goal Status: Active Ulcer/skin breakdown will have a volume reduction of 30% by week 4 Date Initiated:  12/20/2021 Date Inactivated: 04/11/2022 Target Resolution Date: 04/06/2022 Goal Status: Unmet Unmet Reason: infection, osteo Ulcer/skin breakdown will have a volume reduction of 50% by week 8 Date Initiated: 12/20/2021 Date Inactivated: 04/11/2022 Target Resolution Date: 04/06/2022 Unmet Reason: osteo, severe Goal Status: Unmet kyphosis Interventions: Assess patient/caregiver ability to obtain necessary supplies Assess patient/caregiver ability to perform ulcer/skin care regimen upon admission and as needed Assess ulceration(s) every visit Notes: Electronic Signature(s) Signed: 04/17/2022 6:01:19 PM By: Baruch Gouty RN, BSN Entered By: Baruch Gouty on 04/17/2022 11:11:32 -------------------------------------------------------------------------------- Negative Pressure Wound Therapy Maintenance (NPWT) Details Patient Name: Date of Service: EARNESTENE, ANGELLO 04/17/2022 10:30 A M Medical Record Number: [id number] Patient Account Number: [id number] Date of Birth/Sex: Treating RN: [birth year] (88 y.o. [name] Primary Care Jessiah Wojnar: [name] Other Clinician: Referring Coley Kulikowski: Treating Zaniya Mcaulay/Extender: Nicholaus Corolla in Treatment: 17 NPWT Maintenance Performed for: Wound #1 Thoracic spine Performed By: Baruch Gouty, RN Type: Other Coverage Size (sq cm): 1.82 Pressure Type: Constant Pressure Setting: 125 mmHG Drain Type: None Primary Contact: Other : prisma Sponge/Dressing  Type: Foam, Black Date Initiated: 02/19/2022 Dressing Removed: No Quantity of Sponges/Gauze Removed: 1 Canister Changed: No Dressing Reapplied: No Quantity of Sponges/Gauze Inserted: 1 Respones T Treatment: o good Days On NPWT : 58 Post Procedure Diagnosis Same as Pre-procedure Electronic Signature(s) Signed: 04/17/2022 6:01:19 PM By: Baruch Gouty RN, BSN Entered By: Baruch Gouty on 04/17/2022  11:18:52 -------------------------------------------------------------------------------- Pain Assessment Details Patient Name: Date of Service: Stacy Like B. 04/17/2022 10:30 A M Medical Record Number: [id number] Patient Account Number: [id number] Date of Birth/Sex: Treating RN: [birth year] Perry (88 y.o. [name] Primary Care Nyari Olsson: [name] Other Clinician: Referring Cattleya Dobratz: Treating Carman Auxier/Extender: Nicholaus Corolla in Treatment: 17 Active Problems Location of Pain Severity and Description of Pain Patient Has Paino Yes Site Locations Pain Location: Pain in Ulcers With Dressing Change: Yes Duration of the Pain. Constant / Intermittento Intermittent Rate the pain. Current Pain Level: 1 Worst Pain Level: 4 Least Pain Level: 0 Character of Pain Describe the Pain: Aching, Other: itching Pain Management and Medication Current Pain Management: Medication: Yes Other: reposition Is the Current Pain Management Adequate: Adequate How does your wound impact your activities of daily livingo Sleep: No Bathing: No Appetite: No Relationship With Others: No Bladder Continence: No Emotions: No Bowel Continence: No Work: No Toileting: No Drive: No Dressing: No Hobbies: No Electronic Signature(s) Signed: 04/17/2022 6:01:19 PM By: Baruch Gouty RN, BSN Entered By: Baruch Gouty on 04/17/2022 11:01:21 -------------------------------------------------------------------------------- Patient/Caregiver Education Details Patient Name: Date of Service: Marijean Heath 7/11/2023andnbsp10:30 A M Medical Record Number: [id number] Patient Account Number: [id number] Date of Birth/Gender: Treating RN: [birth year] (88 y.o. [name] Primary Care Physician: [name] Other Clinician: Referring Physician: Treating Physician/Extender: Nicholaus Corolla in Treatment: 17 Education Assessment Education  Provided To: Patient Education Topics Provided Pressure: Methods: Explain/Verbal Responses: Reinforcements needed, State content correctly Wound/Skin Impairment: Methods: Explain/Verbal Responses: Reinforcements needed, State content correctly Electronic Signature(s) Signed: 04/17/2022 6:01:19 PM By: Baruch Gouty RN, BSN Entered By: Baruch Gouty on 04/17/2022 11:11:54 -------------------------------------------------------------------------------- Wound Assessment Details Patient Name: Date of Service: Stacy Like B. 04/17/2022 10:30 A M Medical Record Number: [id number] Patient Account Number: [id number] Date of Birth/Sex: Treating RN: [birth year] (88 y.o. [name] Primary Care Tereka Thorley: [name] Other Clinician: Referring Jernee Murtaugh: Treating Fredrika Canby/Extender: Nicholaus Corolla in Treatment: 17 Wound Status Wound Number: 1 Primary Etiology: Pressure Ulcer Wound Location: Thoracic spine Wound Status: Open Wounding Event: Gradually Appeared Comorbid History: Hypertension, Type II Diabetes Date Acquired: 12/19/2020 Weeks Of Treatment: 17 Clustered Wound: No Photos Wound Measurements Length: (cm) 1.4 Width: (cm) 1.3 Depth: (cm) 0.9 Area: (cm) 1.429 Volume: (cm) 1.286 Wound Description Classification: Category/Stage IV Wound Margin: Well defined, not attached Exudate Amount: Medium Exudate Type: Serosanguineous Exudate Color: red, brown Foul Odor After Cleansing: Slough/Fibrino % Reduction in Area: 58.7% % Reduction in Volume: 75.2% Epithelialization: Small (1-33%) Tunneling: No Undermining: Yes Starting Position (o'clock): 11 Ending Position (o'clock): 4 Maximum Distance: (cm) 4.5 No Yes Wound Bed Granulation Amount: Large (67-100%) Exposed Structure Granulation Quality: Red, Pink Fascia Exposed: No Necrotic Amount: Small (1-33%) Fat Layer (Subcutaneous Tissue) Exposed: Yes Necrotic Quality: Adherent  Slough Tendon Exposed: No Muscle Exposed: No Joint Exposed: No Bone Exposed: No Treatment Notes Wound #1 (Thoracic spine) Cleanser Wound Cleanser Discharge Instruction: Cleanse the wound with wound cleanser prior to applying a clean dressing using gauze sponges, not tissue or cotton balls. Peri-Wound Care Skin Prep Discharge Instruction: Use skin prep as directed Topical Primary Dressing  Promogran Prisma Matrix, 4.34 (sq in) (silver collagen) Discharge Instruction: Moisten collagen with saline or hydrogel VAC Secondary Dressing Secured With Compression Wrap Compression Stockings Add-Ons Electronic Signature(s) Signed: 04/17/2022 6:01:19 PM By: Baruch Gouty RN, BSN Entered By: Baruch Gouty on 04/17/2022 11:09:08 -------------------------------------------------------------------------------- Marks Details Patient Name: Date of Service: Stacy Like B. 04/17/2022 10:30 A M Medical Record Number: [id number] Patient Account Number: [id number] Date of Birth/Sex: Treating RN: [birth year] (88 y.o. [name] Primary Care Niharika Savino: [name] Other Clinician: Referring Adelita Hone: Treating Aliyha Fornes/Extender: Nicholaus Corolla in Treatment: 17 Vital Signs Time Taken: 11:00 Temperature (F): 98.5 Height (in): 62 Pulse (bpm): 101 Weight (lbs): 110 Respiratory Rate (breaths/min): 18 Body Mass Index (BMI): 20.1 Blood Pressure (mmHg): 102/63 Reference Range: 80 - 120 mg / dl Electronic Signature(s) Signed: 04/17/2022 6:01:19 PM By: Baruch Gouty RN, BSN Entered By: Baruch Gouty on 04/17/2022 11:00:43

## 2022-04-17 NOTE — Progress Notes (Signed)
MAJESTA, LEICHTER ([id number]) Visit Report for 04/17/2022 Chief Complaint Document Details Patient Name: Date of Service: [name], [name] 04/17/2022 10:30 A M Medical Record Number: [id number] Patient Account Number: [id number] Date of Birth/Sex: Treating RN: Jan 18, 1923 (88 y.o. Elam Dutch Primary Care Provider: [name] Other Clinician: Referring Provider: Treating Provider/Extender: Nicholaus Corolla in Treatment: 17 Information Obtained from: Patient Chief Complaint Patient is at the clinic for treatment of an open pressure ulcer Electronic Signature(s) Signed: 04/17/2022 11:27:54 AM By: Fredirick Maudlin MD FACS Entered By: Fredirick Maudlin on 04/17/2022 11:27:54 -------------------------------------------------------------------------------- Debridement Details Patient Name: Date of Service: Stacy Like B. 04/17/2022 10:30 A M Medical Record Number: [id number] Patient Account Number: [id number] Date of Birth/Sex: Treating RN: November 19, 1922 (88 y.o. Elam Dutch Primary Care Provider: [name] Other Clinician: Referring Provider: Treating Provider/Extender: Nicholaus Corolla in Treatment: 17 Debridement Performed for Assessment: Wound #1 Thoracic spine Performed By: Physician Fredirick Maudlin, MD Debridement Type: Debridement Level of Consciousness (Pre-procedure): Awake and Alert Pre-procedure Verification/Time Out Yes - 11:15 Taken: Start Time: 11:17 Pain Control: Lidocaine 4% T opical Solution T Area Debrided (L x W): otal 1.4 (cm) x 1.3 (cm) = 1.82 (cm) Tissue and other material debrided: Viable, Non-Viable, Slough, Subcutaneous, Slough Level: Skin/Subcutaneous Tissue Debridement Description: Excisional Instrument: Curette Bleeding: Minimum Hemostasis Achieved: Pressure Procedural Pain: 1 Post Procedural Pain: 0 Response to Treatment: Procedure was tolerated well Level of Consciousness (Post-  Awake and Alert procedure): Post Debridement Measurements of Total Wound Length: (cm) 1.4 Stage: Category/Stage IV Width: (cm) 1.3 Depth: (cm) 0.9 Volume: (cm) 1.286 Character of Wound/Ulcer Post Debridement: Improved Post Procedure Diagnosis Same as Pre-procedure Electronic Signature(s) Signed: 04/17/2022 4:27:02 PM By: Fredirick Maudlin MD FACS Signed: 04/17/2022 6:01:19 PM By: Baruch Gouty RN, BSN Entered By: Baruch Gouty on 04/17/2022 11:19:58 -------------------------------------------------------------------------------- HPI Details Patient Name: Date of Service: Stacy Like B. 04/17/2022 10:30 A M Medical Record Number: [id number] Patient Account Number: [id number] Date of Birth/Sex: Treating RN: 03-Nov-1922 (88 y.o. Elam Dutch Primary Care Provider: [name] Other Clinician: Referring Provider: Treating Provider/Extender: Nicholaus Corolla in Treatment: 17 History of Present Illness HPI Description: ADMISSION 12/19/21 This is a 88 year old woman who is presenting to clinic today with a an open ulcer over her thoracic spine. She has severe kyphosis and a history of prior T12 fracture, as well as type 2 diabetes mellitus. The wound has been present for about a year. She is accompanied by her daughters, 1 of whom is a Marine scientist. They have been trying to offload the site by using pillows and supports to keep returned while in bed, and an eggcrate foam with a cut out for the wound for when she is sitting up. They have been applying Santyl to the site. She does have home health and the provider took a culture which was positive for Pseudomonas. She was on ciprofloxacin for this. A recent repeat culture was negative. Most recent hemoglobin A1c was 6.5, in February. A C-reactive protein also obtained in February was elevated at 24.1. No imaging has been performed of the site. The patient denies significant pain. 12/25/2021: Last week, I took a bone  biopsy as well as culture. Fortunately, the bone biopsy was negative for osteomyelitis. The culture returned with rare corynebacterium and rare Enterococcus faecalis. We have been using Santyl and Hydrofera Blue. T oday, the wound appears cleaner and there are buds of granulation tissue forming around the perimeter. 01/01/2022: Due to the  positive culture, we switched to topical gentamicin. Today, she has had a little bit of a slough accumulation. The wound continues to have buds of granulation tissue forming. Apparently, the Hydrofera Blue was placed just over the wound opening rather than down in to contact the surface. No purulent drainage or odor. 01/08/2022: Last week, there was more slough in the wound bed, so I changed back to Santyl. There is now hypertrophic granulation tissue around the wound opening. Once again, we seem to be having some difficulty getting the Hydrofera Blue tucked up into the wound; there is undermining that extends for 3 to 4 cm at the 12 o'clock position and the Hydrofera Blue is simply sitting on top of the wound without being in contact with the surface. Bone is still palpable, but tissue is beginning to close in over it. 01/22/2022: The wound looks much cleaner this week and the hypertrophic granulation tissue responded appropriately to silver nitrate application. She continues to have undermining for about 4 cm from 12:00 to 4:00. I can still feel the bone in the base at about the 2 o'clock position, but it is less prominent and tissue is closing over. We have been using Santyl and Hydrofera Blue. The daughter that accompanies the patient today indicates that they have been very diligent in making sure the [facility] is tucked under and into the full base of the wound. 02/05/2022: The wound overall is improved. The undermining is about the same but there is less bone palpable. We have been using Santyl and Hydrofera Blue. Minimal slough accumulation in the 2 weeks  since her last visit. 02/19/2022: The visible wound surface is very clean without any slough accumulation. Although I can feel bone in the undermined portion of the wound, it is now covered with a layer of tissue. The undermined area is about the same size, however. We have been using Santyl with Hydrofera Blue. She has been approved for snap VAC use. 02/26/2022: Last week, we applied a snap VAC. The wound dimensions have come in by a bit in all directions. The VAC canister did fill, however, and was starting to leak so the patient's daughter removed the VAC and dressed the wound as we had been previously. There is a little bit of thin slough on the wound surface with some senescent tissue at the inferior margin. Bone is still palpable but covered with a layer of tissue. No odor. 03/06/2022: We continue to have technical difficulties with the snap VAC which resulted in the patient's family removing it and going back to the dressing changes with Hydrofera Blue. It sounds like there are issues maintaining suction and the drape is getting bunched up creating a leak. The wound measured larger today, but the intake nurse thinks that perhaps her measurements were taken slightly differently than last week's. There is just some thin slough on the wound surface as well as some hypertrophic granulation tissue on the lateral border. 03/13/2022: The snap VAC worked much better this week and only began leaking yesterday. At that point, the patient's family removed it and applied Hydrofera Blue to her wound. Today, the tissue coverage over the bone that had been exposed is more substantial. She still has undermining present. There is also some senescent skin around the wound orifice along with some hypertrophic granulation tissue. 03/20/2022: The snap VAC unfortunately failed on Friday. There has really been no significant change to the wound for several weeks. The degree of undermining is unchanged. The surface is  clean. 03/27/2022: Once again, the snap VAC failed prematurely and had to be removed on Saturday. The undermining remains the same. There is more tissue coverage of the previously exposed bone. The wound surface has just a light layer of biofilm and thin slough. She was approved for a standard wound VAC, but there are currently no home health agencies able to staff dressing changes for her; it is too difficult for her and her family to bring her to the wound center more than once a week. 04/03/2022: The snap VAC stayed on but the canister filled up on Sunday. The direct depth of the wound is a little bit shallower but the undermining has not really changed. There is some slightly discolored, darker tissue at the 9 o'clock position. No odor or purulent drainage. 04/11/2022: Using 2 canisters worked well and the snap VAC lasted until yesterday. She has her standard wound VAC with her today. The orifice of the wound has contracted somewhat. There is still substantial undermining at 12:00, but the undermined portion to the right between 2 and 4:00 has closed and somewhat. The previously exposed bone is no longer even palpable under the overlying tissue. No concern for infection. 04/17/2022: A standard VAC dressing was initiated last week. The family has been changing it at home, due to the inability to secure home health services. It is a little bit challenging to apply, due to the small orifice of the wound with substantial undermining. Today, the undermining at 12:00 seems like it might be a little bit deeper, but the 2-4 o'clock undermining is decreased. There is excellent tissue coverage over the previously exposed bone. There is a little bit of slough accumulation at the orifice, along with some senescent skin. Electronic Signature(s) Signed: 04/17/2022 11:31:05 AM By: Fredirick Maudlin MD FACS Entered By: Fredirick Maudlin on 04/17/2022  11:31:05 -------------------------------------------------------------------------------- Physical Exam Details Patient Name: Date of Service: Stacy Like B. 04/17/2022 10:30 A M Medical Record Number: [id number] Patient Account Number: [id number] Date of Birth/Sex: Treating RN: 26-Sep-1923 (88 y.o. Elam Dutch Primary Care Provider: [name] Other Clinician: Referring Provider: Treating Provider/Extender: Nicholaus Corolla in Treatment: 17 Constitutional . Slightly tachycardic, asymptomatic. . . No acute distress.Marland Kitchen Respiratory Normal work of breathing on room air.. Notes 04/17/2022: Today, the undermining at 12:00 seems like it might be a little bit deeper, but the 2-4 o'clock undermining is decreased. There is excellent tissue coverage over the previously exposed bone. There is a little bit of slough accumulation at the orifice, along with some senescent skin. Electronic Signature(s) Signed: 04/17/2022 11:31:40 AM By: Fredirick Maudlin MD FACS Entered By: Fredirick Maudlin on 04/17/2022 11:31:40 -------------------------------------------------------------------------------- Physician Orders Details Patient Name: Date of Service: Stacy Like B. 04/17/2022 10:30 A M Medical Record Number: [id number] Patient Account Number: [id number] Date of Birth/Sex: Treating RN: Jun 03, 1923 (88 y.o. Elam Dutch Primary Care Provider: [name] Other Clinician: Referring Provider: Treating Provider/Extender: Nicholaus Corolla in Treatment: 17 Verbal / Phone Orders: No Diagnosis Coding ICD-10 Coding Code Description L89.104 Pressure ulcer of unspecified part of back, stage 4 E11.622 Type 2 diabetes mellitus with other skin ulcer S22.089S Unspecified fracture of T11-T12 vertebra, sequela Follow-up Appointments ppointment in 1 week. - Dr Celine Ahr - Room 1 - with Vaughan Basta Return A Monday 7/71 @ 2:45 pm Bathing/ Shower/ Hygiene May  shower with protection but do not get wound dressing(s) wet. Negative Presssure Wound Therapy Wound Vac to wound continuously at 130m/hg pressure Black Foam Off-Loading  Turn and reposition every 2 hours Other: - KEEP PRESSURE OFF OF BACK MAY USE EGG CRATE CUSHION Wound Treatment Wound #1 - Thoracic spine Cleanser: Wound Cleanser (Generic) 1 x Per Week/30 Days Discharge Instructions: Cleanse the wound with wound cleanser prior to applying a clean dressing using gauze sponges, not tissue or cotton balls. Peri-Wound Care: Skin Prep 1 x Per Week/30 Days Discharge Instructions: Use skin prep as directed Prim Dressing: Promogran Prisma Matrix, 4.34 (sq in) (silver collagen) 1 x Per Week/30 Days ary Discharge Instructions: Moisten collagen with saline or hydrogel Prim Dressing: VAC ary 1 x Per Week/30 Days Patient Medications llergies: sulfamethoxazole A Notifications Medication Indication Start End prior to debridement 04/17/2022 lidocaine DOSE topical 4 % cream - cream topical Electronic Signature(s) Signed: 04/17/2022 4:27:02 PM By: Fredirick Maudlin MD FACS Entered By: Fredirick Maudlin on 04/17/2022 11:31:59 -------------------------------------------------------------------------------- Problem List Details Patient Name: Date of Service: Stacy Like B. 04/17/2022 10:30 A M Medical Record Number: [id number] Patient Account Number: [id number] Date of Birth/Sex: Treating RN: 13-Aug-1923 (88 y.o. Elam Dutch Primary Care Provider: [name] Other Clinician: Referring Provider: Treating Provider/Extender: Nicholaus Corolla in Treatment: 17 Active Problems ICD-10 Encounter Code Description Active Date MDM Diagnosis L89.104 Pressure ulcer of unspecified part of back, stage 4 12/19/2021 No Yes E11.622 Type 2 diabetes mellitus with other skin ulcer 12/19/2021 No Yes S22.089S Unspecified fracture of T11-T12 vertebra, sequela 12/19/2021 No  Yes Inactive Problems Resolved Problems Electronic Signature(s) Signed: 04/17/2022 11:27:27 AM By: Fredirick Maudlin MD FACS Entered By: Fredirick Maudlin on 04/17/2022 11:27:27 -------------------------------------------------------------------------------- Progress Note Details Patient Name: Date of Service: Stacy Like B. 04/17/2022 10:30 A M Medical Record Number: [id number] Patient Account Number: [id number] Date of Birth/Sex: Treating RN: 1923-08-27 (88 y.o. Elam Dutch Primary Care Provider: [name] Other Clinician: Referring Provider: Treating Provider/Extender: Nicholaus Corolla in Treatment: 17 Subjective Chief Complaint Information obtained from Patient Patient is at the clinic for treatment of an open pressure ulcer History of Present Illness (HPI) ADMISSION 12/19/21 This is a 88 year old woman who is presenting to clinic today with a an open ulcer over her thoracic spine. She has severe kyphosis and a history of prior T12 fracture, as well as type 2 diabetes mellitus. The wound has been present for about a year. She is accompanied by her daughters, 1 of whom is a Marine scientist. They have been trying to offload the site by using pillows and supports to keep returned while in bed, and an eggcrate foam with a cut out for the wound for when she is sitting up. They have been applying Santyl to the site. She does have home health and the provider took a culture which was positive for Pseudomonas. She was on ciprofloxacin for this. A recent repeat culture was negative. Most recent hemoglobin A1c was 6.5, in February. A C-reactive protein also obtained in February was elevated at 24.1. No imaging has been performed of the site. The patient denies significant pain. 12/25/2021: Last week, I took a bone biopsy as well as culture. Fortunately, the bone biopsy was negative for osteomyelitis. The culture returned with rare corynebacterium and rare Enterococcus  faecalis. We have been using Santyl and Hydrofera Blue. T oday, the wound appears cleaner and there are buds of granulation tissue forming around the perimeter. 01/01/2022: Due to the positive culture, we switched to topical gentamicin. Today, she has had a little bit of a slough accumulation. The wound continues to have buds of granulation tissue  forming. Apparently, the Hydrofera Blue was placed just over the wound opening rather than down in to contact the surface. No purulent drainage or odor. 01/08/2022: Last week, there was more slough in the wound bed, so I changed back to Santyl. There is now hypertrophic granulation tissue around the wound opening. Once again, we seem to be having some difficulty getting the Hydrofera Blue tucked up into the wound; there is undermining that extends for 3 to 4 cm at the 12 o'clock position and the Hydrofera Blue is simply sitting on top of the wound without being in contact with the surface. Bone is still palpable, but tissue is beginning to close in over it. 01/22/2022: The wound looks much cleaner this week and the hypertrophic granulation tissue responded appropriately to silver nitrate application. She continues to have undermining for about 4 cm from 12:00 to 4:00. I can still feel the bone in the base at about the 2 o'clock position, but it is less prominent and tissue is closing over. We have been using Santyl and Hydrofera Blue. The daughter that accompanies the patient today indicates that they have been very diligent in making sure the [facility] is tucked under and into the full base of the wound. 02/05/2022: The wound overall is improved. The undermining is about the same but there is less bone palpable. We have been using Santyl and Hydrofera Blue. Minimal slough accumulation in the 2 weeks since her last visit. 02/19/2022: The visible wound surface is very clean without any slough accumulation. Although I can feel bone in the undermined portion of  the wound, it is now covered with a layer of tissue. The undermined area is about the same size, however. We have been using Santyl with Hydrofera Blue. She has been approved for snap VAC use. 02/26/2022: Last week, we applied a snap VAC. The wound dimensions have come in by a bit in all directions. The VAC canister did fill, however, and was starting to leak so the patient's daughter removed the VAC and dressed the wound as we had been previously. There is a little bit of thin slough on the wound surface with some senescent tissue at the inferior margin. Bone is still palpable but covered with a layer of tissue. No odor. 03/06/2022: We continue to have technical difficulties with the snap VAC which resulted in the patient's family removing it and going back to the dressing changes with Hydrofera Blue. It sounds like there are issues maintaining suction and the drape is getting bunched up creating a leak. The wound measured larger today, but the intake nurse thinks that perhaps her measurements were taken slightly differently than last week's. There is just some thin slough on the wound surface as well as some hypertrophic granulation tissue on the lateral border. 03/13/2022: The snap VAC worked much better this week and only began leaking yesterday. At that point, the patient's family removed it and applied Hydrofera Blue to her wound. Today, the tissue coverage over the bone that had been exposed is more substantial. She still has undermining present. There is also some senescent skin around the wound orifice along with some hypertrophic granulation tissue. 03/20/2022: The snap VAC unfortunately failed on Friday. There has really been no significant change to the wound for several weeks. The degree of undermining is unchanged. The surface is clean. 03/27/2022: Once again, the snap VAC failed prematurely and had to be removed on Saturday. The undermining remains the same. There is more tissue coverage of  the previously exposed bone. The wound surface has just a light layer of biofilm and thin slough. She was approved for a standard wound VAC, but there are currently no home health agencies able to staff dressing changes for her; it is too difficult for her and her family to bring her to the wound center more than once a week. 04/03/2022: The snap VAC stayed on but the canister filled up on Sunday. The direct depth of the wound is a little bit shallower but the undermining has not really changed. There is some slightly discolored, darker tissue at the 9 o'clock position. No odor or purulent drainage. 04/11/2022: Using 2 canisters worked well and the snap VAC lasted until yesterday. She has her standard wound VAC with her today. The orifice of the wound has contracted somewhat. There is still substantial undermining at 12:00, but the undermined portion to the right between 2 and 4:00 has closed and somewhat. The previously exposed bone is no longer even palpable under the overlying tissue. No concern for infection. 04/17/2022: A standard VAC dressing was initiated last week. The family has been changing it at home, due to the inability to secure home health services. It is a little bit challenging to apply, due to the small orifice of the wound with substantial undermining. Today, the undermining at 12:00 seems like it might be a little bit deeper, but the 2-4 o'clock undermining is decreased. There is excellent tissue coverage over the previously exposed bone. There is a little bit of slough accumulation at the orifice, along with some senescent skin. Patient History Family History Unknown History. Social History Never smoker, Marital Status - Married, Alcohol Use - Never, Drug Use - No History, Caffeine Use - Rarely. Medical History Ear/Nose/Mouth/Throat Denies history of Chronic sinus problems/congestion, Middle ear problems Cardiovascular Patient has history of Hypertension Endocrine Patient has  history of Type II Diabetes Immunological Denies history of Lupus Erythematosus, Raynaudoos, Scleroderma Oncologic Denies history of Received Chemotherapy, Received Radiation Hospitalization/Surgery History - t/11 t/12 FRACTURE/SURGERY. - FEMUR FX SURGERY. - TOTAL HYSTERECTOMY. - CHOLECYSTECTOMY. - TONSILLECTOMY. Medical A Surgical History Notes nd Ear/Nose/Mouth/Throat PARTIAL HEARING LEFT HEAR W/ HEARING AID TOTAL DEAFNESS RIGHT EAR Gastrointestinal Melena Endocrine Hypothyroidism Hyperlipidemia associated with type 2 diabetes mellitus (HCC) Musculoskeletal Closed T12 fracture (HCC) Hip fracture (HCC) Closed comminuted intertrochanteric fracture of proximal end of right femur (HCC) Objective Constitutional Slightly tachycardic, asymptomatic. No acute distress.. Vitals Time Taken: 11:00 AM, Height: 62 in, Weight: 110 lbs, BMI: 20.1, Temperature: 98.5 F, Pulse: 101 bpm, Respiratory Rate: 18 breaths/min, Blood Pressure: 102/63 mmHg. Respiratory Normal work of breathing on room air.. General Notes: 04/17/2022: Today, the undermining at 12:00 seems like it might be a little bit deeper, but the 2-4 o'clock undermining is decreased. There is excellent tissue coverage over the previously exposed bone. There is a little bit of slough accumulation at the orifice, along with some senescent skin. Integumentary (Hair, Skin) Wound #1 status is Open. Original cause of wound was Gradually Appeared. The date acquired was: 12/19/2020. The wound has been in treatment 17 weeks. The wound is located on the Thoracic spine. The wound measures 1.4cm length x 1.3cm width x 0.9cm depth; 1.429cm^2 area and 1.286cm^3 volume. There is Fat Layer (Subcutaneous Tissue) exposed. There is no tunneling noted, however, there is undermining starting at 11:00 and ending at 4:00 with a maximum distance of 4.5cm. There is a medium amount of serosanguineous drainage noted. The wound margin is well defined and not attached  to  the wound base. There is large (67-100%) red, pink granulation within the wound bed. There is a small (1-33%) amount of necrotic tissue within the wound bed including Adherent Slough. Assessment Active Problems ICD-10 Pressure ulcer of unspecified part of back, stage 4 Type 2 diabetes mellitus with other skin ulcer Unspecified fracture of T11-T12 vertebra, sequela Procedures Wound #1 Pre-procedure diagnosis of Wound #1 is a Pressure Ulcer located on the Thoracic spine . There was a Excisional Skin/Subcutaneous Tissue Debridement with a total area of 1.82 sq cm performed by Fredirick Maudlin, MD. With the following instrument(s): Curette to remove Viable and Non-Viable tissue/material. Material removed includes Subcutaneous Tissue and Slough and after achieving pain control using Lidocaine 4% T opical Solution. No specimens were taken. A time out was conducted at 11:15, prior to the start of the procedure. A Minimum amount of bleeding was controlled with Pressure. The procedure was tolerated well with a pain level of 1 throughout and a pain level of 0 following the procedure. Post Debridement Measurements: 1.4cm length x 1.3cm width x 0.9cm depth; 1.286cm^3 volume. Post debridement Stage noted as Category/Stage IV. Character of Wound/Ulcer Post Debridement is improved. Post procedure Diagnosis Wound #1: Same as Pre-Procedure Plan Follow-up Appointments: Return Appointment in 1 week. - Dr Celine Ahr - Room 1 - with [facility] Monday 7/71 @ 2:45 pm Bathing/ Shower/ Hygiene: May shower with protection but do not get wound dressing(s) wet. Negative Presssure Wound Therapy: Wound Vac to wound continuously at 113m/hg pressure Black Foam Off-Loading: Turn and reposition every 2 hours Other: - KEEP PRESSURE OFF OF BACK MAY USE EGG CRATE CUSHION The following medication(s) was prescribed: lidocaine topical 4 % cream cream topical for prior to debridement was prescribed at facility WOUND #1: - Thoracic  spine Wound Laterality: Cleanser: Wound Cleanser (Generic) 1 x Per Week/30 Days Discharge Instructions: Cleanse the wound with wound cleanser prior to applying a clean dressing using gauze sponges, not tissue or cotton balls. Peri-Wound Care: Skin Prep 1 x Per Week/30 Days Discharge Instructions: Use skin prep as directed Prim Dressing: Promogran Prisma Matrix, 4.34 (sq in) (silver collagen) 1 x Per Week/30 Days ary Discharge Instructions: Moisten collagen with saline or hydrogel Prim Dressing: VAC 1 x Per Week/30 Days ary Today, the undermining at 12:00 seems like it might be a little bit deeper, but the 2-4 o'clock undermining is decreased. There is excellent tissue coverage over the previously exposed bone. There is a little bit of slough accumulation at the orifice, along with some senescent skin. I used a curette to debride slough and nonviable subcutaneous tissue, as well as the perimeter senescent skin. We will continue using the traditional wound VAC. Follow-up in a week. Electronic Signature(s) Signed: 04/17/2022 11:32:31 AM By: CFredirick MaudlinMD FACS Entered By: CFredirick Maudlinon 04/17/2022 11:32:31 -------------------------------------------------------------------------------- HxROS Details Patient Name: Date of Service: HJim LikeB. 04/17/2022 10:30 A M Medical Record Number: [id number] Account Number: [id number] of Birth/Sex: Treating RN: [birth year](88y.o. FElam DutchPrimary Care Provider: RJanace LittenOther Clinician: Referring Provider: Treating Provider/Extender: CNicholaus Corollain Treatment: 17 Ear/Nose/Mouth/Throat Medical History: Negative for: Chronic sinus problems/congestion; Middle ear problems Past Medical History Notes: PARTIAL HEARING LEFT HEAR W/ HEARING AID TOTAL DEAFNESS RIGHT EAR Cardiovascular Medical History: Positive for: Hypertension Gastrointestinal Medical History: Past Medical History  Notes: Melena Endocrine Medical History: Positive for: Type II Diabetes Past Medical History Notes: Hypothyroidism Hyperlipidemia associated with type 2 diabetes mellitus [place] Treated with: Diet Immunological Medical  History: Negative for: Lupus Erythematosus; Raynauds; Scleroderma Musculoskeletal Medical History: Past Medical History Notes: Closed T12 fracture ([place]) Hip fracture ([place]) Closed comminuted intertrochanteric fracture of proximal end of right femur [facility]) Oncologic Medical History: Negative for: Received Chemotherapy; Received Radiation Immunizations Pneumococcal Vaccine: Received Pneumococcal Vaccination: Yes Received Pneumococcal Vaccination On or After 60th Birthday: No Implantable Devices None Hospitalization / Surgery History Type of Hospitalization/Surgery t/11 t/12 FRACTURE/SURGERY FEMUR FX SURGERY TOTAL HYSTERECTOMY CHOLECYSTECTOMY TONSILLECTOMY Family and Social History Unknown History: Yes; Never smoker; Marital Status - Married; Alcohol Use: Never; Drug Use: No History; Caffeine Use: Rarely; Financial Concerns: No; Food, Clothing or Shelter Needs: No; Support System Lacking: No; Transportation Concerns: No Engineer, maintenance) Signed: 04/17/2022 4:27:02 PM By: Fredirick Maudlin MD FACS Signed: 04/17/2022 6:01:19 PM By: Baruch Gouty RN, BSN Entered By: Fredirick Maudlin on 04/17/2022 11:31:10 -------------------------------------------------------------------------------- SuperBill Details Patient Name: Date of Service: Stacy Like B. 04/17/2022 Medical Record Number: [id number] Patient Account Number: [id number] Date of Birth/Sex: Treating RN: 10/16/1922 (88 y.o. Elam Dutch Primary Care Provider: [name] Other Clinician: Referring Provider: Treating Provider/Extender: Nicholaus Corolla in Treatment: 17 Diagnosis Coding ICD-10 Codes Code Description L89.104 Pressure ulcer of unspecified part of  back, stage 4 E11.622 Type 2 diabetes mellitus with other skin ulcer S22.089S Unspecified fracture of T11-T12 vertebra, sequela Facility Procedures CPT4 Code: [id number] Description: 63893 - DEB SUBQ TISSUE 20 SQ CM/< ICD-10 Diagnosis Description L89.104 Pressure ulcer of unspecified part of back, stage 4 Modifier: Quantity: 1 CPT4 Code: [id number] Description: 11572 - WOUND VAC-50 SQ CM OR LESS Modifier: 86 Quantity: 1 Physician Procedures : CPT4 Code Description Modifier [id number] - WC PHYS LEVEL 3 - EST PT 25 ICD-10 Diagnosis Description L89.104 Pressure ulcer of unspecified part of back, stage 4 E11.622 Type 2 diabetes mellitus with other skin ulcer S22.089S Unspecified fracture of  T11-T12 vertebra, sequela Quantity: 1 : [id number] 80321 - WC PHYS SUBQ TISS 20 SQ CM ICD-10 Diagnosis Description L89.104 Pressure ulcer of unspecified part of back, stage 4 Quantity: 1 Electronic Signature(s) Signed: 04/17/2022 11:34:29 AM By: Fredirick Maudlin MD FACS Entered By: Fredirick Maudlin on 04/17/2022 11:34:28

## 2022-04-23 ENCOUNTER — Encounter (HOSPITAL_BASED_OUTPATIENT_CLINIC_OR_DEPARTMENT_OTHER): Payer: Medicare Other | Admitting: General Surgery

## 2022-04-23 DIAGNOSIS — L89104 Pressure ulcer of unspecified part of back, stage 4: Secondary | ICD-10-CM | POA: Diagnosis not present

## 2022-04-23 NOTE — Progress Notes (Signed)
[name] ([id number]) Visit Report for 04/23/2022 Chief Complaint Document Details Patient Name: Date of Service: [name], [name] 04/23/2022 2:45 PM Medical Record Number: [id number] Patient Account Number: [id number] Date of Birth/Sex: Treating RN: Jul 15, 1923 (88 y.o. Elam Dutch Primary Care Provider: [name] Other Clinician: Referring Provider: Treating Provider/Extender: Nicholaus Corolla in Treatment: 17 Information Obtained from: Patient Chief Complaint Patient is at the clinic for treatment of an open pressure ulcer Electronic Signature(s) Signed: 04/23/2022 3:48:27 PM By: Fredirick Maudlin MD FACS Entered By: Fredirick Maudlin on 04/23/2022 15:48:27 -------------------------------------------------------------------------------- HPI Details Patient Name: Date of Service: [name]. 04/23/2022 2:45 PM Medical Record Number: [id number] Patient Account Number: [id number] Date of Birth/Sex: Treating RN: 10-25-1922 (88 y.o. Elam Dutch Primary Care Provider: [name] Other Clinician: Referring Provider: Treating Provider/Extender: Nicholaus Corolla in Treatment: 17 History of Present Illness HPI Description: ADMISSION 12/19/21 This is a 88 year old woman who is presenting to clinic today with a an open ulcer over her thoracic spine. She has severe kyphosis and a history of prior T12 fracture, as well as type 2 diabetes mellitus. The wound has been present for about a year. She is accompanied by her daughters, 1 of whom is a Marine scientist. They have been trying to offload the site by using pillows and supports to keep returned while in bed, and an eggcrate foam with a cut out for the wound for when she is sitting up. They have been applying Santyl to the site. She does have home health and the provider took a culture which was positive for Pseudomonas. She was on ciprofloxacin for this. A recent repeat culture  was negative. Most recent hemoglobin A1c was 6.5, in February. A C-reactive protein also obtained in February was elevated at 24.1. No imaging has been performed of the site. The patient denies significant pain. 12/25/2021: Last week, I took a bone biopsy as well as culture. Fortunately, the bone biopsy was negative for osteomyelitis. The culture returned with rare corynebacterium and rare Enterococcus faecalis. We have been using Santyl and Hydrofera Blue. T oday, the wound appears cleaner and there are buds of granulation tissue forming around the perimeter. 01/01/2022: Due to the positive culture, we switched to topical gentamicin. Today, she has had a little bit of a slough accumulation. The wound continues to have buds of granulation tissue forming. Apparently, the Hydrofera Blue was placed just over the wound opening rather than down in to contact the surface. No purulent drainage or odor. 01/08/2022: Last week, there was more slough in the wound bed, so I changed back to Santyl. There is now hypertrophic granulation tissue around the wound opening. Once again, we seem to be having some difficulty getting the Hydrofera Blue tucked up into the wound; there is undermining that extends for 3 to 4 cm at the 12 o'clock position and the Hydrofera Blue is simply sitting on top of the wound without being in contact with the surface. Bone is still palpable, but tissue is beginning to close in over it. 01/22/2022: The wound looks much cleaner this week and the hypertrophic granulation tissue responded appropriately to silver nitrate application. She continues to have undermining for about 4 cm from 12:00 to 4:00. I can still feel the bone in the base at about the 2 o'clock position, but it is less prominent and tissue is closing over. We have been using Santyl and Hydrofera Blue. The daughter that accompanies the patient today indicates that  they have been very diligent in making sure the [facility] is  tucked under and into the full base of the wound. 02/05/2022: The wound overall is improved. The undermining is about the same but there is less bone palpable. We have been using Santyl and Hydrofera Blue. Minimal slough accumulation in the 2 weeks since her last visit. 02/19/2022: The visible wound surface is very clean without any slough accumulation. Although I can feel bone in the undermined portion of the wound, it is now covered with a layer of tissue. The undermined area is about the same size, however. We have been using Santyl with Hydrofera Blue. She has been approved for snap VAC use. 02/26/2022: Last week, we applied a snap VAC. The wound dimensions have come in by a bit in all directions. The VAC canister did fill, however, and was starting to leak so the patient's daughter removed the VAC and dressed the wound as we had been previously. There is a little bit of thin slough on the wound surface with some senescent tissue at the inferior margin. Bone is still palpable but covered with a layer of tissue. No odor. 03/06/2022: We continue to have technical difficulties with the snap VAC which resulted in the patient's family removing it and going back to the dressing changes with Hydrofera Blue. It sounds like there are issues maintaining suction and the drape is getting bunched up creating a leak. The wound measured larger today, but the intake nurse thinks that perhaps her measurements were taken slightly differently than last week's. There is just some thin slough on the wound surface as well as some hypertrophic granulation tissue on the lateral border. 03/13/2022: The snap VAC worked much better this week and only began leaking yesterday. At that point, the patient's family removed it and applied Hydrofera Blue to her wound. Today, the tissue coverage over the bone that had been exposed is more substantial. She still has undermining present. There is also some senescent skin around the wound  orifice along with some hypertrophic granulation tissue. 03/20/2022: The snap VAC unfortunately failed on Friday. There has really been no significant change to the wound for several weeks. The degree of undermining is unchanged. The surface is clean. 03/27/2022: Once again, the snap VAC failed prematurely and had to be removed on Saturday. The undermining remains the same. There is more tissue coverage of the previously exposed bone. The wound surface has just a light layer of biofilm and thin slough. She was approved for a standard wound VAC, but there are currently no home health agencies able to staff dressing changes for her; it is too difficult for her and her family to bring her to the wound center more than once a week. 04/03/2022: The snap VAC stayed on but the canister filled up on Sunday. The direct depth of the wound is a little bit shallower but the undermining has not really changed. There is some slightly discolored, darker tissue at the 9 o'clock position. No odor or purulent drainage. 04/11/2022: Using 2 canisters worked well and the snap VAC lasted until yesterday. She has her standard wound VAC with her today. The orifice of the wound has contracted somewhat. There is still substantial undermining at 12:00, but the undermined portion to the right between 2 and 4:00 has closed and somewhat. The previously exposed bone is no longer even palpable under the overlying tissue. No concern for infection. 04/17/2022: A standard VAC dressing was initiated last week. The family has been  changing it at home, due to the inability to secure home health services. It is a little bit challenging to apply, due to the small orifice of the wound with substantial undermining. Today, the undermining at 12:00 seems like it might be a little bit deeper, but the 2-4 o'clock undermining is decreased. There is excellent tissue coverage over the previously exposed bone. There is a little bit of slough accumulation  at the orifice, along with some senescent skin. 04/17/2022: The wound VAC was not changed all week due to the family being overwhelmed with other issues. Fortunately, there does not seem to have been any untoward effects from this. The orifice of the wound remains unchanged but the undermining and tunneling has narrowed to just 1 area at 12:00. No purulent drainage or odor. Good tissue coverage of the previously exposed bone. Electronic Signature(s) Signed: 04/23/2022 3:49:28 PM By: Fredirick Maudlin MD FACS Entered By: Fredirick Maudlin on 04/23/2022 15:49:28 -------------------------------------------------------------------------------- Physical Exam Details Patient Name: Date of Service: [name] 04/23/2022 2:45 PM Medical Record Number: [id number] Patient Account Number: [id number] Date of Birth/Sex: Treating RN: 05-06-1923 (88 y.o. Elam Dutch Primary Care Provider: [name] Other Clinician: Referring Provider: Treating Provider/Extender: Nicholaus Corolla in Treatment: 17 Constitutional . . . . No acute distress.Marland Kitchen Respiratory Normal work of breathing on room air.. Notes 04/23/2022: The orifice of the wound remains unchanged but the undermining and tunneling has narrowed to just 1 area at 12:00. No purulent drainage or odor. Good tissue coverage of the previously exposed bone. Electronic Signature(s) Signed: 04/23/2022 3:50:40 PM By: Fredirick Maudlin MD FACS Entered By: Fredirick Maudlin on 04/23/2022 15:50:40 -------------------------------------------------------------------------------- Physician Orders Details Patient Name: Date of Service: [name] 04/23/2022 2:45 PM Medical Record Number: [id number] Patient Account Number: [id number] Date of Birth/Sex: Treating RN: 09/30/1923 (88 y.o. Elam Dutch Primary Care Provider: [name] Other Clinician: Referring Provider: Treating Provider/Extender: Nicholaus Corolla in Treatment: 17 Verbal / Phone Orders: No Diagnosis Coding ICD-10 Coding Code Description L89.104 Pressure ulcer of unspecified part of back, stage 4 E11.622 Type 2 diabetes mellitus with other skin ulcer S22.089S Unspecified fracture of T11-T12 vertebra, sequela Follow-up Appointments ppointment in 1 week. - Dr Celine Ahr - Room 1 - with Vaughan Basta Return A Monday 7/24 @ 2:00 pm Monday 7/31 @ 10:30 am Bathing/ Shower/ Hygiene May shower with protection but do not get wound dressing(s) wet. Negative Presssure Wound Therapy Wound Vac to wound continuously at 123m/hg pressure Black Foam Off-Loading Turn and reposition every 2 hours Other: - KEEP PRESSURE OFF OF BACK MAY USE EGG CRATE CUSHION Wound Treatment Wound #1 - Thoracic spine Cleanser: Wound Cleanser (Generic) 2 x Per Week/30 Days Discharge Instructions: Cleanse the wound with wound cleanser prior to applying a clean dressing using gauze sponges, not tissue or cotton balls. Peri-Wound Care: Skin Prep 2 x Per Week/30 Days Discharge Instructions: Use skin prep as directed Prim Dressing: Promogran Prisma Matrix, 4.34 (sq in) (silver collagen) 2 x Per Week/30 Days ary Discharge Instructions: Moisten collagen with saline or hydrogel Prim Dressing: VAC ary 2 x Per Week/30 Days Electronic Signature(s) Unsigned Entered By: CFredirick Maudlinon 04/23/2022 15:50:49 -------------------------------------------------------------------------------- Problem List Details Patient Name: Date of Service: Stacy NRAYCHEL, DOWLER7/17/2023 2:45 PM Medical Record Number: [id number] Account Number: [id number] of Birth/Sex: Treating RN: 7January 27, 1924(88y.o. FElam DutchPrimary Care Provider: RJanace LittenOther Clinician: Referring Provider: Treating Provider/Extender: CNicholaus Corollain  Treatment: 17 Active Problems ICD-10 Encounter Code Description Active Date  MDM Diagnosis L89.104 Pressure ulcer of unspecified part of back, stage 4 12/19/2021 No Yes E11.622 Type 2 diabetes mellitus with other skin ulcer 12/19/2021 No Yes S22.089S Unspecified fracture of T11-T12 vertebra, sequela 12/19/2021 No Yes Inactive Problems Resolved Problems Electronic Signature(s) Signed: 04/23/2022 3:46:29 PM By: Fredirick Maudlin MD FACS Previous Signature: 04/23/2022 3:35:15 PM Version By: Fredirick Maudlin MD FACS Entered By: Fredirick Maudlin on 04/23/2022 15:46:28 -------------------------------------------------------------------------------- Progress Note Details Patient Name: Date of Service: [name]. 04/23/2022 2:45 PM Medical Record Number: [id number] Patient Account Number: [id number] Date of Birth/Sex: Treating RN: [birth year] (88 y.o. Elam Dutch Primary Care Provider: [name] Other Clinician: Referring Provider: Treating Provider/Extender: Nicholaus Corolla in Treatment: 17 Subjective Chief Complaint Information obtained from Patient Patient is at the clinic for treatment of an open pressure ulcer History of Present Illness (HPI) ADMISSION 12/19/21 This is a 88 year old woman who is presenting to clinic today with a an open ulcer over her thoracic spine. She has severe kyphosis and a history of prior T12 fracture, as well as type 2 diabetes mellitus. The wound has been present for about a year. She is accompanied by her daughters, 1 of whom is a Marine scientist. They have been trying to offload the site by using pillows and supports to keep returned while in bed, and an eggcrate foam with a cut out for the wound for when she is sitting up. They have been applying Santyl to the site. She does have home health and the provider took a culture which was positive for Pseudomonas. She was on ciprofloxacin for this. A recent repeat culture was negative. Most recent hemoglobin A1c was 6.5, in February. A C-reactive protein also  obtained in February was elevated at 24.1. No imaging has been performed of the site. The patient denies significant pain. 12/25/2021: Last week, I took a bone biopsy as well as culture. Fortunately, the bone biopsy was negative for osteomyelitis. The culture returned with rare corynebacterium and rare Enterococcus faecalis. We have been using Santyl and Hydrofera Blue. T oday, the wound appears cleaner and there are buds of granulation tissue forming around the perimeter. 01/01/2022: Due to the positive culture, we switched to topical gentamicin. Today, she has had a little bit of a slough accumulation. The wound continues to have buds of granulation tissue forming. Apparently, the Hydrofera Blue was placed just over the wound opening rather than down in to contact the surface. No purulent drainage or odor. 01/08/2022: Last week, there was more slough in the wound bed, so I changed back to Santyl. There is now hypertrophic granulation tissue around the wound opening. Once again, we seem to be having some difficulty getting the Hydrofera Blue tucked up into the wound; there is undermining that extends for 3 to 4 cm at the 12 o'clock position and the Hydrofera Blue is simply sitting on top of the wound without being in contact with the surface. Bone is still palpable, but tissue is beginning to close in over it. 01/22/2022: The wound looks much cleaner this week and the hypertrophic granulation tissue responded appropriately to silver nitrate application. She continues to have undermining for about 4 cm from 12:00 to 4:00. I can still feel the bone in the base at about the 2 o'clock position, but it is less prominent and tissue is closing over. We have been using Santyl and Hydrofera Blue. The daughter that accompanies the patient today  indicates that they have been very diligent in making sure the [facility] is tucked under and into the full base of the wound. 02/05/2022: The wound overall is improved.  The undermining is about the same but there is less bone palpable. We have been using Santyl and Hydrofera Blue. Minimal slough accumulation in the 2 weeks since her last visit. 02/19/2022: The visible wound surface is very clean without any slough accumulation. Although I can feel bone in the undermined portion of the wound, it is now covered with a layer of tissue. The undermined area is about the same size, however. We have been using Santyl with Hydrofera Blue. She has been approved for snap VAC use. 02/26/2022: Last week, we applied a snap VAC. The wound dimensions have come in by a bit in all directions. The VAC canister did fill, however, and was starting to leak so the patient's daughter removed the VAC and dressed the wound as we had been previously. There is a little bit of thin slough on the wound surface with some senescent tissue at the inferior margin. Bone is still palpable but covered with a layer of tissue. No odor. 03/06/2022: We continue to have technical difficulties with the snap VAC which resulted in the patient's family removing it and going back to the dressing changes with Hydrofera Blue. It sounds like there are issues maintaining suction and the drape is getting bunched up creating a leak. The wound measured larger today, but the intake nurse thinks that perhaps her measurements were taken slightly differently than last week's. There is just some thin slough on the wound surface as well as some hypertrophic granulation tissue on the lateral border. 03/13/2022: The snap VAC worked much better this week and only began leaking yesterday. At that point, the patient's family removed it and applied Hydrofera Blue to her wound. Today, the tissue coverage over the bone that had been exposed is more substantial. She still has undermining present. There is also some senescent skin around the wound orifice along with some hypertrophic granulation tissue. 03/20/2022: The snap VAC  unfortunately failed on Friday. There has really been no significant change to the wound for several weeks. The degree of undermining is unchanged. The surface is clean. 03/27/2022: Once again, the snap VAC failed prematurely and had to be removed on Saturday. The undermining remains the same. There is more tissue coverage of the previously exposed bone. The wound surface has just a light layer of biofilm and thin slough. She was approved for a standard wound VAC, but there are currently no home health agencies able to staff dressing changes for her; it is too difficult for her and her family to bring her to the wound center more than once a week. 04/03/2022: The snap VAC stayed on but the canister filled up on Sunday. The direct depth of the wound is a little bit shallower but the undermining has not really changed. There is some slightly discolored, darker tissue at the 9 o'clock position. No odor or purulent drainage. 04/11/2022: Using 2 canisters worked well and the snap VAC lasted until yesterday. She has her standard wound VAC with her today. The orifice of the wound has contracted somewhat. There is still substantial undermining at 12:00, but the undermined portion to the right between 2 and 4:00 has closed and somewhat. The previously exposed bone is no longer even palpable under the overlying tissue. No concern for infection. 04/17/2022: A standard VAC dressing was initiated last week. The family  has been changing it at home, due to the inability to secure home health services. It is a little bit challenging to apply, due to the small orifice of the wound with substantial undermining. Today, the undermining at 12:00 seems like it might be a little bit deeper, but the 2-4 o'clock undermining is decreased. There is excellent tissue coverage over the previously exposed bone. There is a little bit of slough accumulation at the orifice, along with some senescent skin. 04/17/2022: The wound VAC was not  changed all week due to the family being overwhelmed with other issues. Fortunately, there does not seem to have been any untoward effects from this. The orifice of the wound remains unchanged but the undermining and tunneling has narrowed to just 1 area at 12:00. No purulent drainage or odor. Good tissue coverage of the previously exposed bone. Patient History Family History Unknown History. Social History Never smoker, Marital Status - Married, Alcohol Use - Never, Drug Use - No History, Caffeine Use - Rarely. Medical History Ear/Nose/Mouth/Throat Denies history of Chronic sinus problems/congestion, Middle ear problems Cardiovascular Patient has history of Hypertension Endocrine Patient has history of Type II Diabetes Immunological Denies history of Lupus Erythematosus, Raynaudoos, Scleroderma Oncologic Denies history of Received Chemotherapy, Received Radiation Hospitalization/Surgery History - t/11 t/12 FRACTURE/SURGERY. - FEMUR FX SURGERY. - TOTAL HYSTERECTOMY. - CHOLECYSTECTOMY. - TONSILLECTOMY. Medical A Surgical History Notes nd Ear/Nose/Mouth/Throat PARTIAL HEARING LEFT HEAR W/ HEARING AID TOTAL DEAFNESS RIGHT EAR Gastrointestinal Melena Endocrine Hypothyroidism Hyperlipidemia associated with type 2 diabetes mellitus (HCC) Musculoskeletal Closed T12 fracture (HCC) Hip fracture (HCC) Closed comminuted intertrochanteric fracture of proximal end of right femur (HCC) Objective Constitutional No acute distress.. Vitals Time Taken: 3:24 PM, Height: 62 in, Weight: 110 lbs, BMI: 20.1, Temperature: 98.5 F, Pulse: 82 bpm, Respiratory Rate: 18 breaths/min, Blood Pressure: 112/68 mmHg. Respiratory Normal work of breathing on room air.. General Notes: 04/23/2022: The orifice of the wound remains unchanged but the undermining and tunneling has narrowed to just 1 area at 12:00. No purulent drainage or odor. Good tissue coverage of the previously exposed bone. Integumentary (Hair,  Skin) Wound #1 status is Open. Original cause of wound was Gradually Appeared. The date acquired was: 12/19/2020. The wound has been in treatment 17 weeks. The wound is located on the Thoracic spine. The wound measures 1.4cm length x 1.2cm width x 1cm depth; 1.319cm^2 area and 1.319cm^3 volume. There is Fat Layer (Subcutaneous Tissue) exposed. There is no tunneling noted, however, there is undermining starting at 11:00 and ending at 4:00 with a maximum distance of 4.4cm. There is a medium amount of serosanguineous drainage noted. The wound margin is well defined and not attached to the wound base. There is large (67-100%) red, pink granulation within the wound bed. There is a small (1-33%) amount of necrotic tissue within the wound bed including Adherent Slough. Assessment Active Problems ICD-10 Pressure ulcer of unspecified part of back, stage 4 Type 2 diabetes mellitus with other skin ulcer Unspecified fracture of T11-T12 vertebra, sequela Plan Follow-up Appointments: Return Appointment in 1 week. - Dr Celine Ahr - Room 1 - with [facility] Monday 7/24 @ 2:00 pm Monday 7/31 @ 10:30 am Bathing/ Shower/ Hygiene: May shower with protection but do not get wound dressing(s) wet. Negative Presssure Wound Therapy: Wound Vac to wound continuously at 181m/hg pressure Black Foam Off-Loading: Turn and reposition every 2 hours Other: - KEEP PRESSURE OFF OF BACK MAY USE EGG CRATE CUSHION WOUND #1: - Thoracic spine Wound Laterality: Cleanser: Wound Cleanser (Generic)  2 x Per Week/30 Days Discharge Instructions: Cleanse the wound with wound cleanser prior to applying a clean dressing using gauze sponges, not tissue or cotton balls. Peri-Wound Care: Skin Prep 2 x Per Week/30 Days Discharge Instructions: Use skin prep as directed Prim Dressing: Promogran Prisma Matrix, 4.34 (sq in) (silver collagen) 2 x Per Week/30 Days ary Discharge Instructions: Moisten collagen with saline or hydrogel Prim Dressing: VAC 2  x Per Week/30 Days ary 04/23/2022: The orifice of the wound remains unchanged but the undermining and tunneling has narrowed to just 1 area at 12:00. No purulent drainage or odor. Good tissue coverage of the previously exposed bone. No debridement was necessary today. The wound VAC was replaced. The family was gently reminded that it does need to be changed every 2 to 3 days. Follow-up in 1 week. Electronic Signature(s) Signed: 04/23/2022 3:51:22 PM By: Fredirick Maudlin MD FACS Entered By: Fredirick Maudlin on 04/23/2022 15:51:22 -------------------------------------------------------------------------------- HxROS Details Patient Name: Date of Service: [name]. 04/23/2022 2:45 PM Medical Record Number: [id number] Patient Account Number: [id number] Date of Birth/Sex: Treating RN: 1923/02/04 (88 y.o. Elam Dutch Primary Care Provider: [name] Other Clinician: Referring Provider: Treating Provider/Extender: Nicholaus Corolla in Treatment: 17 Ear/Nose/Mouth/Throat Medical History: Negative for: Chronic sinus problems/congestion; Middle ear problems Past Medical History Notes: PARTIAL HEARING LEFT HEAR W/ HEARING AID TOTAL DEAFNESS RIGHT EAR Cardiovascular Medical History: Positive for: Hypertension Gastrointestinal Medical History: Past Medical History Notes: Melena Endocrine Medical History: Positive for: Type II Diabetes Past Medical History Notes: Hypothyroidism Hyperlipidemia associated with type 2 diabetes mellitus ([place]) Treated with: Diet Immunological Medical History: Negative for: Lupus Erythematosus; Raynauds; Scleroderma Musculoskeletal Medical History: Past Medical History Notes: Closed T12 fracture ([place]) Hip fracture ([place]) Closed comminuted intertrochanteric fracture of proximal end of right femur [facility]) Oncologic Medical History: Negative for: Received Chemotherapy; Received Radiation Immunizations Pneumococcal  Vaccine: Received Pneumococcal Vaccination: Yes Received Pneumococcal Vaccination On or After 60th Birthday: No Implantable Devices None Hospitalization / Surgery History Type of Hospitalization/Surgery t/11 t/12 FRACTURE/SURGERY FEMUR FX SURGERY TOTAL HYSTERECTOMY CHOLECYSTECTOMY TONSILLECTOMY Family and Social History Unknown History: Yes; Never smoker; Marital Status - Married; Alcohol Use: Never; Drug Use: No History; Caffeine Use: Rarely; Financial Concerns: No; Food, Clothing or Shelter Needs: No; Support System Lacking: No; Transportation Concerns: No Electronic Signature(s) Unsigned Entered By: Fredirick Maudlin on 04/23/2022 15:49:35 -------------------------------------------------------------------------------- SuperBill Details Patient Name: Date of Service: [name], [name] 04/23/2022 Medical Record Number: [id number] Patient Account Number: [id number] Date of Birth/Sex: Treating RN: [birth year] (88 y.o. Elam Dutch Primary Care Provider: [name] Other Clinician: Referring Provider: Treating Provider/Extender: Nicholaus Corolla in Treatment: 17 Diagnosis Coding ICD-10 Codes Code Description L89.104 Pressure ulcer of unspecified part of back, stage 4 E11.622 Type 2 diabetes mellitus with other skin ulcer S22.089S Unspecified fracture of T11-T12 vertebra, sequela Facility Procedures CPT4 Code: 50932671 Description: 24580 - WOUND VAC-50 SQ CM OR LESS Modifier: Quantity: 1 Physician Procedures : CPT4 Code Description Modifier [id number] - WC PHYS LEVEL 4 - EST PT ICD-10 Diagnosis Description L89.104 Pressure ulcer of unspecified part of back, stage 4 E11.622 Type 2 diabetes mellitus with other skin ulcer S22.089S Unspecified fracture of  T11-T12 vertebra, sequela Quantity: 1 Electronic Signature(s) Signed: 04/23/2022 3:53:20 PM By: Fredirick Maudlin MD FACS Entered By: Fredirick Maudlin on 04/23/2022 15:53:20

## 2022-04-24 NOTE — Progress Notes (Signed)
Stacy, Perry ([id number]) Visit Report for 04/23/2022 Arrival Information Details Patient Name: Date of Service: [name], [name] 04/23/2022 2:45 PM Medical Record Number: [id number] Patient Account Number: [id number] Date of Birth/Sex: Treating RN: 29-Mar-1923 (88 y.o. Elam Dutch Primary Care Felipe Paluch: [name] Other Clinician: Referring Zaniya Mcaulay: Treating Rhyen Mazariego/Extender: Nicholaus Corolla in Treatment: 34 Visit Information History Since Last Visit Added or deleted any medications: No Patient Arrived: Wheel Chair Any new allergies or adverse reactions: No Arrival Time: 15:20 Had a fall or experienced change in No Accompanied By: daughters activities of daily living that may affect Transfer Assistance: Manual risk of falls: Patient Identification Verified: Yes Signs or symptoms of abuse/neglect since last visito No Secondary Verification Process Completed: Yes Hospitalized since last visit: No Patient Requires Transmission-Based Precautions: No Implantable device outside of the clinic excluding No Patient Has Alerts: No cellular tissue based products placed in the center since last visit: Has Dressing in Place as Prescribed: Yes Pain Present Now: Yes Electronic Signature(s) Signed: 04/24/2022 5:31:58 PM By: Baruch Gouty RN, BSN Entered By: Baruch Gouty on 04/23/2022 15:24:09 -------------------------------------------------------------------------------- Encounter Discharge Information Details Patient Name: Date of Service: Stacy Like B. 04/23/2022 2:45 PM Medical Record Number: [id number] Patient Account Number: [id number] Date of Birth/Sex: Treating RN: 06-18-1923 (88 y.o. Elam Dutch Primary Care Harlan Vinal: [name] Other Clinician: Referring Paizleigh Wilds: Treating Liron Eissler/Extender: Nicholaus Corolla in Treatment: 17 Encounter Discharge Information Items Discharge Condition:  Stable Ambulatory Status: Wheelchair Discharge Destination: Home Transportation: Private Auto Accompanied By: daughters Schedule Follow-up Appointment: Yes Clinical Summary of Care: Patient Declined Electronic Signature(s) Signed: 04/24/2022 5:31:58 PM By: Baruch Gouty RN, BSN Entered By: Baruch Gouty on 04/23/2022 16:08:11 -------------------------------------------------------------------------------- Lower Extremity Assessment Details Patient Name: Date of Service: Stacy MELESSA, Perry 04/23/2022 2:45 PM Medical Record Number: [id number] Patient Account Number: [id number] Date of Birth/Sex: Treating RN: 1922/10/19 (88 y.o. Elam Dutch Primary Care Jaylee Lantry: [name] Other Clinician: Referring Delbra Zellars: Treating Blaike Vickers/Extender: Nicholaus Corolla in Treatment: 17 Electronic Signature(s) Signed: 04/24/2022 5:31:58 PM By: Baruch Gouty RN, BSN Entered By: Baruch Gouty on 04/23/2022 15:25:27 -------------------------------------------------------------------------------- Multi Wound Chart Details Patient Name: Date of Service: Stacy Like B. 04/23/2022 2:45 PM Medical Record Number: [id number] Patient Account Number: [id number] Date of Birth/Sex: Treating RN: January 17, 1923 (88 y.o. Elam Dutch Primary Care Masey Scheiber: [name] Other Clinician: Referring Kaleen Rochette: Treating Leonela Kivi/Extender: Nicholaus Corolla in Treatment: 17 Vital Signs Height(in): 62 Pulse(bpm): 61 Weight(lbs): 110 Blood Pressure(mmHg): 112/68 Body Mass Index(BMI): 20.1 Temperature(F): 98.5 Respiratory Rate(breaths/min): 18 Photos: [N/A:N/A] Thoracic spine N/A N/A Wound Location: Gradually Appeared N/A N/A Wounding Event: Pressure Ulcer N/A N/A Primary Etiology: Hypertension, Type II Diabetes N/A N/A Comorbid History: 12/19/2020 N/A N/A Date Acquired: 17 N/A N/A Weeks of Treatment: Open N/A N/A Wound Status: No N/A  N/A Wound Recurrence: 1.4x1.2x1 N/A N/A Measurements L x W x D (cm) 1.319 N/A N/A A (cm) : rea 1.319 N/A N/A Volume (cm) : 61.80% N/A N/A % Reduction in A rea: 74.60% N/A N/A % Reduction in Volume: 11 Starting Position 1 (o'clock): 4 Ending Position 1 (o'clock): 4.4 Maximum Distance 1 (cm): Yes N/A N/A Undermining: Category/Stage IV N/A N/A Classification: Medium N/A N/A Exudate A mount: Serosanguineous N/A N/A Exudate Type: red, brown N/A N/A Exudate Color: Well defined, not attached N/A N/A Wound Margin: Large (67-100%) N/A N/A Granulation Amount: Red, Pink N/A N/A Granulation Quality: Small (1-33%) N/A N/A Necrotic Amount: Fat Layer (Subcutaneous  Tissue): Yes N/A N/A Exposed Structures: Fascia: No Tendon: No Muscle: No Joint: No Bone: No Small (1-33%) N/A N/A Epithelialization: Negative Pressure Wound Therapy N/A N/A Procedures Performed: Maintenance (NPWT) Treatment Notes Electronic Signature(s) Signed: 04/23/2022 3:48:21 PM By: Fredirick Maudlin MD FACS Signed: 04/24/2022 5:31:58 PM By: Baruch Gouty RN, BSN Entered By: Fredirick Maudlin on 04/23/2022 15:48:21 -------------------------------------------------------------------------------- Multi-Disciplinary Care Plan Details Patient Name: Date of Service: Stacy Like B. 04/23/2022 2:45 PM Medical Record Number: [id number] Patient Account Number: [id number] Date of Birth/Sex: Treating RN: 11-22-1922 (88 y.o. Elam Dutch Primary Care Shigeru Lampert: [name] Other Clinician: Referring Aira Sallade: Treating Rydge Texidor/Extender: Nicholaus Corolla in Treatment: Marceline reviewed with physician Active Inactive Pressure Nursing Diagnoses: Knowledge deficit related to causes and risk factors for pressure ulcer development Knowledge deficit related to management of pressures ulcers Potential for impaired tissue integrity related to pressure,  friction, moisture, and shear Goals: Patient will remain free of pressure ulcers Date Initiated: 12/25/2021 Date Inactivated: 04/11/2022 Target Resolution Date: 04/06/2022 Goal Status: Met Patient/caregiver will verbalize understanding of pressure ulcer management Date Initiated: 12/25/2021 Target Resolution Date: 05/04/2022 Goal Status: Active Interventions: Assess: immobility, friction, shearing, incontinence upon admission and as needed Assess offloading mechanisms upon admission and as needed Notes: Wound/Skin Impairment Nursing Diagnoses: Impaired tissue integrity Knowledge deficit related to ulceration/compromised skin integrity Goals: Patient will have a decrease in wound volume by X% from date: (specify in notes) Date Initiated: 12/20/2021 Date Inactivated: 01/08/2022 Target Resolution Date: 01/12/2022 Goal Status: Unmet Unmet Reason: pressure relief Patient/caregiver will verbalize understanding of skin care regimen Date Initiated: 12/20/2021 Target Resolution Date: 05/04/2022 Goal Status: Active Ulcer/skin breakdown will have a volume reduction of 30% by week 4 Date Initiated: 12/20/2021 Date Inactivated: 04/11/2022 Target Resolution Date: 04/06/2022 Goal Status: Unmet Unmet Reason: infection, osteo Ulcer/skin breakdown will have a volume reduction of 50% by week 8 Date Initiated: 12/20/2021 Date Inactivated: 04/11/2022 Target Resolution Date: 04/06/2022 Unmet Reason: osteo, severe Goal Status: Unmet kyphosis Interventions: Assess patient/caregiver ability to obtain necessary supplies Assess patient/caregiver ability to perform ulcer/skin care regimen upon admission and as needed Assess ulceration(s) every visit Notes: Electronic Signature(s) Signed: 04/24/2022 5:31:58 PM By: Baruch Gouty RN, BSN Entered By: Baruch Gouty on 04/23/2022 15:34:11 -------------------------------------------------------------------------------- Negative Pressure Wound Therapy Maintenance  (NPWT) Details Patient Name: Date of Service: MARKELL, SCHRIER 04/23/2022 2:45 PM Medical Record Number: [id number] Patient Account Number: [id number] Date of Birth/Sex: Treating RN: 1923/05/08 (88 y.o. Elam Dutch Primary Care Jenet Durio: [name] Other Clinician: Referring Bryanah Sidell: Treating Jasira Robinson/Extender: Nicholaus Corolla in Treatment: 17 NPWT Maintenance Performed for: Wound #1 Thoracic spine Performed By: Baruch Gouty, RN Type: Other Coverage Size (sq cm): 1.68 Pressure Type: Constant Pressure Setting: 125 mmHG Drain Type: None Primary Contact: Other : prismA Sponge/Dressing Type: Foam, Black Date Initiated: 02/19/2022 Dressing Removed: No Quantity of Sponges/Gauze Removed: 1 Canister Changed: No Canister Exudate Volume: 20 Dressing Reapplied: No Quantity of Sponges/Gauze Inserted: 1 Respones T Treatment: o good Days On NPWT : 64 Post Procedure Diagnosis Same as Pre-procedure Electronic Signature(s) Signed: 04/24/2022 5:31:58 PM By: Baruch Gouty RN, BSN Entered By: Baruch Gouty on 04/23/2022 15:45:16 -------------------------------------------------------------------------------- Pain Assessment Details Patient Name: Date of Service: Stacy Like B. 04/23/2022 2:45 PM Medical Record Number: [id number] Patient Account Number: [id number] Date of Birth/Sex: Treating RN: Jul 20, 1923 (88 y.o. Elam Dutch Primary Care Cyriah Childrey: [name] Other Clinician: Referring Lalena Salas: Treating Dayanna Pryce/Extender: Nicholaus Corolla in Treatment: 99 Active Problems  Location of Pain Severity and Description of Pain Patient Has Paino Yes Site Locations Pain Location: Pain in Ulcers With Dressing Change: Yes Duration of the Pain. Constant / Intermittento Intermittent Rate the pain. Current Pain Level: 3 Worst Pain Level: 5 Least Pain Level: 0 Character of Pain Describe the Pain: Aching,  Tender Pain Management and Medication Current Pain Management: Medication: Yes Other: reposition Is the Current Pain Management Adequate: Adequate How does your wound impact your activities of daily livingo Sleep: No Bathing: No Appetite: No Relationship With Others: No Bladder Continence: No Emotions: No Bowel Continence: No Work: No Toileting: No Drive: No Dressing: No Hobbies: No Electronic Signature(s) Signed: 04/24/2022 5:31:58 PM By: Baruch Gouty RN, BSN Entered By: Baruch Gouty on 04/23/2022 15:25:20 -------------------------------------------------------------------------------- Patient/Caregiver Education Details Patient Name: Date of Service: Marijean Heath 7/17/2023andnbsp2:45 PM Medical Record Number: [id number] Patient Account Number: [id number] Date of Birth/Gender: Treating RN: 03/01/1923 (88 y.o. Elam Dutch Primary Care Physician: [name] Other Clinician: Referring Physician: Treating Physician/Extender: Nicholaus Corolla in Treatment: 17 Education Assessment Education Provided To: Patient Education Topics Provided Pressure: Methods: Explain/Verbal Responses: Reinforcements needed, State content correctly Wound/Skin Impairment: Methods: Explain/Verbal Responses: Reinforcements needed, State content correctly Electronic Signature(s) Signed: 04/24/2022 5:31:58 PM By: Baruch Gouty RN, BSN Entered By: Baruch Gouty on 04/23/2022 15:34:39 -------------------------------------------------------------------------------- Wound Assessment Details Patient Name: Date of Service: Stacy Like B. 04/23/2022 2:45 PM Medical Record Number: [id number] Patient Account Number: [id number] Date of Birth/Sex: Treating RN: [birth year] (88 y.o. Elam Dutch Primary Care Alyssah Algeo: [name] Other Clinician: Referring Xyler Terpening: Treating Chemeka Filice/Extender: Nicholaus Corolla in  Treatment: 17 Wound Status Wound Number: 1 Primary Etiology: Pressure Ulcer Wound Location: Thoracic spine Wound Status: Open Wounding Event: Gradually Appeared Comorbid History: Hypertension, Type II Diabetes Date Acquired: 12/19/2020 Weeks Of Treatment: 17 Clustered Wound: No Photos Wound Measurements Length: (cm) 1.4 Width: (cm) 1.2 Depth: (cm) 1 Area: (cm) 1.319 Volume: (cm) 1.319 % Reduction in Area: 61.8% % Reduction in Volume: 74.6% Epithelialization: Small (1-33%) Tunneling: No Undermining: Yes Starting Position (o'clock): 11 Ending Position (o'clock): 4 Maximum Distance: (cm) 4.4 Wound Description Classification: Category/Stage IV Wound Margin: Well defined, not attached Exudate Amount: Medium Exudate Type: Serosanguineous Exudate Color: red, brown Foul Odor After Cleansing: No Slough/Fibrino Yes Wound Bed Granulation Amount: Large (67-100%) Exposed Structure Granulation Quality: Red, Pink Fascia Exposed: No Necrotic Amount: Small (1-33%) Fat Layer (Subcutaneous Tissue) Exposed: Yes Necrotic Quality: Adherent Slough Tendon Exposed: No Muscle Exposed: No Joint Exposed: No Bone Exposed: No Treatment Notes Wound #1 (Thoracic spine) Cleanser Wound Cleanser Discharge Instruction: Cleanse the wound with wound cleanser prior to applying a clean dressing using gauze sponges, not tissue or cotton balls. Peri-Wound Care Skin Prep Discharge Instruction: Use skin prep as directed Topical Primary Dressing Promogran Prisma Matrix, 4.34 (sq in) (silver collagen) Discharge Instruction: Moisten collagen with saline or hydrogel VAC Secondary Dressing Secured With Compression Wrap Compression Stockings Add-Ons Electronic Signature(s) Signed: 04/24/2022 5:31:58 PM By: Baruch Gouty RN, BSN Entered By: Baruch Gouty on 04/23/2022 15:34:43 -------------------------------------------------------------------------------- [place] Details Patient Name: Date of  Service: Stacy Like B. 04/23/2022 2:45 PM Medical Record Number: [id number] Patient Account Number: [id number] Date of Birth/Sex: Treating RN: 1922/11/03 (88 y.o. Elam Dutch Primary Care Dirk Vanaman: [name] Other Clinician: Referring Mylo Choi: Treating Pollyann Roa/Extender: Nicholaus Corolla in Treatment: 17 Vital Signs Time Taken: 15:24 Temperature (F): 98.5 Height (in): 62 Pulse (bpm): 82 Weight (lbs): 110 Respiratory Rate (breaths/min): 18 Body  Mass Index (BMI): 20.1 Blood Pressure (mmHg): 112/68 Reference Range: 80 - 120 mg / dl Electronic Signature(s) Signed: 04/24/2022 5:31:58 PM By: Baruch Gouty RN, BSN Entered By: Baruch Gouty on 04/23/2022 15:24:34

## 2022-04-30 ENCOUNTER — Encounter (HOSPITAL_BASED_OUTPATIENT_CLINIC_OR_DEPARTMENT_OTHER): Payer: Medicare Other | Admitting: General Surgery

## 2022-04-30 DIAGNOSIS — L89104 Pressure ulcer of unspecified part of back, stage 4: Secondary | ICD-10-CM | POA: Diagnosis not present

## 2022-04-30 NOTE — Progress Notes (Signed)
[name], [name] ([id number]) Visit Report for 04/30/2022 Arrival Information Details Patient Name: Date of Service: [name], [name] 04/30/2022 2:00 PM Medical Record Number: [id number] Patient Account Number: [id number] Date of Birth/Sex: Treating RN: 12/11/1922 (88 y.o. Elam Dutch Primary Care Nezar Buckles: [name] Other Clinician: Referring Taiz Bickle: Treating Elim Peale/Extender: Nicholaus Corolla in Treatment: 18 Visit Information History Since Last Visit Added or deleted any medications: No Patient Arrived: Wheel Chair Any new allergies or adverse reactions: No Arrival Time: 14:29 Had a fall or experienced change in No Accompanied By: daughter activities of daily living that may affect Transfer Assistance: None risk of falls: Patient Identification Verified: Yes Signs or symptoms of abuse/neglect since last visito No Secondary Verification Process Completed: Yes Hospitalized since last visit: No Patient Requires Transmission-Based Precautions: No Implantable device outside of the clinic excluding No Patient Has Alerts: No cellular tissue based products placed in the center since last visit: Has Dressing in Place as Prescribed: Yes Pain Present Now: No Electronic Signature(s) Signed: 04/30/2022 5:44:10 PM By: Baruch Gouty RN, BSN Entered By: Baruch Gouty on 04/30/2022 14:30:01 -------------------------------------------------------------------------------- Encounter Discharge Information Details Patient Name: Date of Service: Stacy Like B. 04/30/2022 2:00 PM Medical Record Number: [id number] Patient Account Number: [id number] Date of Birth/Sex: Treating RN: Jun 19, 1923 (88 y.o. Elam Dutch Primary Care Giavanna Kang: [name] Other Clinician: Referring Levon Boettcher: Treating Gabriell Casimir/Extender: Nicholaus Corolla in Treatment: 18 Encounter Discharge Information Items Discharge Condition: Stable Ambulatory  Status: Wheelchair Discharge Destination: Home Transportation: Private Auto Accompanied By: daughter Schedule Follow-up Appointment: Yes Clinical Summary of Care: Electronic Signature(s) Signed: 04/30/2022 5:44:10 PM By: Baruch Gouty RN, BSN Entered By: Baruch Gouty on 04/30/2022 14:47:56 -------------------------------------------------------------------------------- Lower Extremity Assessment Details Patient Name: Date of Service: Stacy SHAUNTIA, [name] 04/30/2022 2:00 PM Medical Record Number: [id number] Patient Account Number: [id number] Date of Birth/Sex: Treating RN: 03-Dec-1922 (88 y.o. Elam Dutch Primary Care Chalisa Kobler: [name] Other Clinician: Referring Eldene Plocher: Treating Izzie Geers/Extender: Nicholaus Corolla in Treatment: 18 Electronic Signature(s) Signed: 04/30/2022 5:44:10 PM By: Baruch Gouty RN, BSN Entered By: Baruch Gouty on 04/30/2022 14:30:50 -------------------------------------------------------------------------------- Multi Wound Chart Details Patient Name: Date of Service: Stacy Server, [name] 04/30/2022 2:00 PM Medical Record Number: [id number] Patient Account Number: [id number] Date of Birth/Sex: Treating RN: 1923-01-21 (88 y.o. Elam Dutch Primary Care Nyellie Yetter: [name] Other Clinician: Referring Tanise Russman: Treating Fable Huisman/Extender: Nicholaus Corolla in Treatment: 18 Vital Signs Height(in): 62 Pulse(bpm): 15 Weight(lbs): 110 Blood Pressure(mmHg): 119/60 Body Mass Index(BMI): 20.1 Temperature(F): 97.8 Respiratory Rate(breaths/min): 18 Photos: [N/A:N/A] Thoracic spine N/A N/A Wound Location: Gradually Appeared N/A N/A Wounding Event: Pressure Ulcer N/A N/A Primary Etiology: Hypertension, Type II Diabetes N/A N/A Comorbid History: 12/19/2020 N/A N/A Date Acquired: 45 N/A N/A Weeks of Treatment: Open N/A N/A Wound Status: No N/A N/A Wound Recurrence: 1.3x1.1x0.5  N/A N/A Measurements L x W x D (cm) 1.123 N/A N/A A (cm) : rea 0.562 N/A N/A Volume (cm) : 67.50% N/A N/A % Reduction in A rea: 89.20% N/A N/A % Reduction in Volume: 12 Starting Position 1 (o'clock): 4 Ending Position 1 (o'clock): 4.5 Maximum Distance 1 (cm): Yes N/A N/A Undermining: Category/Stage IV N/A N/A Classification: Medium N/A N/A Exudate A mount: Serosanguineous N/A N/A Exudate Type: red, brown N/A N/A Exudate Color: Well defined, not attached N/A N/A Wound Margin: Large (67-100%) N/A N/A Granulation Amount: Red, Pink N/A N/A Granulation Quality: Small (1-33%) N/A N/A Necrotic Amount: Fat Layer (Subcutaneous Tissue): Yes  N/A N/A Exposed Structures: Fascia: No Tendon: No Muscle: No Joint: No Bone: No Small (1-33%) N/A N/A Epithelialization: Chemical Cauterization N/A N/A Procedures Performed: Negative Pressure Wound Therapy Maintenance (NPWT) Treatment Notes Electronic Signature(s) Signed: 04/30/2022 2:44:57 PM By: Fredirick Maudlin MD FACS Signed: 04/30/2022 5:44:10 PM By: Baruch Gouty RN, BSN Entered By: Fredirick Maudlin on 04/30/2022 14:44:57 -------------------------------------------------------------------------------- Multi-Disciplinary Care Plan Details Patient Name: Date of Service: Stacy Server, [name] 04/30/2022 2:00 PM Medical Record Number: [id number] Patient Account Number: [id number] Date of Birth/Sex: Treating RN: January 26, 1923 (87 y.o. Elam Dutch Primary Care Sigfredo Schreier: [name] Other Clinician: Referring [name]: Treating Baley Shands/Extender: Nicholaus Corolla in Treatment: 18 Multidisciplinary Care Plan reviewed with physician Active Inactive Pressure Nursing Diagnoses: Knowledge deficit related to causes and risk factors for pressure ulcer development Knowledge deficit related to management of pressures ulcers Potential for impaired tissue integrity related to pressure, friction,  moisture, and shear Goals: Patient will remain free of pressure ulcers Date Initiated: 12/25/2021 Date Inactivated: 04/11/2022 Target Resolution Date: 04/06/2022 Goal Status: Met Patient/caregiver will verbalize understanding of pressure ulcer management Date Initiated: 12/25/2021 Target Resolution Date: 05/04/2022 Goal Status: Active Interventions: Assess: immobility, friction, shearing, incontinence upon admission and as needed Assess offloading mechanisms upon admission and as needed Notes: Wound/Skin Impairment Nursing Diagnoses: Impaired tissue integrity Knowledge deficit related to ulceration/compromised skin integrity Goals: Patient will have a decrease in wound volume by X% from date: (specify in notes) Date Initiated: 12/20/2021 Date Inactivated: 01/08/2022 Target Resolution Date: 01/12/2022 Goal Status: Unmet Unmet Reason: pressure relief Patient/caregiver will verbalize understanding of skin care regimen Date Initiated: 12/20/2021 Target Resolution Date: 05/04/2022 Goal Status: Active Ulcer/skin breakdown will have a volume reduction of 30% by week 4 Date Initiated: 12/20/2021 Date Inactivated: 04/11/2022 Target Resolution Date: 04/06/2022 Goal Status: Unmet Unmet Reason: infection, osteo Ulcer/skin breakdown will have a volume reduction of 50% by week 8 Date Initiated: 12/20/2021 Date Inactivated: 04/11/2022 Target Resolution Date: 04/06/2022 Unmet Reason: osteo, severe Goal Status: Unmet kyphosis Interventions: Assess patient/caregiver ability to obtain necessary supplies Assess patient/caregiver ability to perform ulcer/skin care regimen upon admission and as needed Assess ulceration(s) every visit Notes: Electronic Signature(s) Signed: 04/30/2022 5:44:10 PM By: Baruch Gouty RN, BSN Entered By: Baruch Gouty on 04/30/2022 14:38:31 -------------------------------------------------------------------------------- Negative Pressure Wound Therapy Maintenance (NPWT)  Details Patient Name: Date of Service: CAROL, THEYS 04/30/2022 2:00 PM Medical Record Number: [id number] Patient Account Number: [id number] Date of Birth/Sex: Treating RN: 07-07-1923 (88 y.o. Elam Dutch Primary Care Dailah Opperman: [name] Other Clinician: Referring Lugene Beougher: Treating Yadriel Kerrigan/Extender: Nicholaus Corolla in Treatment: 18 NPWT Maintenance Performed for: Wound #1 Thoracic spine Performed By: Baruch Gouty, RN Type: Other Coverage Size (sq cm): 1.43 Pressure Type: Constant Pressure Setting: 125 mmHG Drain Type: None Primary Contact: Other : prisma Sponge/Dressing Type: Foam, Black Date Initiated: 02/19/2022 Dressing Removed: No Quantity of Sponges/Gauze Removed: 2 Canister Changed: No Dressing Reapplied: No Quantity of Sponges/Gauze Inserted: 2 Respones T Treatment: o good Days On NPWT : 71 Post Procedure Diagnosis Same as Pre-procedure Electronic Signature(s) Signed: 04/30/2022 5:44:10 PM By: Baruch Gouty RN, BSN Entered By: Baruch Gouty on 04/30/2022 14:43:40 -------------------------------------------------------------------------------- Pain Assessment Details Patient Name: Date of Service: Stacy Like B. 04/30/2022 2:00 PM Medical Record Number: [id number] Patient Account Number: [id number] Date of Birth/Sex: Treating RN: 1923/07/20 (88 y.o. Elam Dutch Primary Care Alison Kubicki: [name] Other Clinician: Referring Vitalia Stough: Treating Domanik Rainville/Extender: Nicholaus Corolla in Treatment: 18 Active Problems Location of Pain Severity  and Description of Pain Patient Has Paino No Site Locations Rate the pain. Current Pain Level: 0 Character of Pain Describe the Pain: Tender Pain Management and Medication Current Pain Management: Electronic Signature(s) Signed: 04/30/2022 5:44:10 PM By: Baruch Gouty RN, BSN Entered By: Baruch Gouty on 04/30/2022  14:30:42 -------------------------------------------------------------------------------- Patient/Caregiver Education Details Patient Name: Date of Service: Marijean Heath 7/24/2023andnbsp2:00 PM Medical Record Number: [id number] Patient Account Number: [id number] Date of Birth/Gender: Treating RN: [birth year] (88 y.o. Elam Dutch Primary Care Physician: [name] Other Clinician: Referring Physician: Treating Physician/Extender: Nicholaus Corolla in Treatment: 18 Education Assessment Education Provided To: Patient Education Topics Provided Pressure: Methods: Explain/Verbal Responses: Reinforcements needed, State content correctly Wound/Skin Impairment: Methods: Explain/Verbal Responses: Reinforcements needed, State content correctly Electronic Signature(s) Signed: 04/30/2022 5:44:10 PM By: Baruch Gouty RN, BSN Entered By: Baruch Gouty on 04/30/2022 14:38:49 -------------------------------------------------------------------------------- Wound Assessment Details Patient Name: Date of Service: Stacy Like B. 04/30/2022 2:00 PM Medical Record Number: [id number] Patient Account Number: [id number] Date of Birth/Sex: Treating RN: [birth year] (88 y.o. Elam Dutch Primary Care Ladonne Sharples: [name] Other Clinician: Referring Latorsha Curling: Treating Maurio Baize/Extender: Nicholaus Corolla in Treatment: 18 Wound Status Wound Number: 1 Primary Etiology: Pressure Ulcer Wound Location: Thoracic spine Wound Status: Open Wounding Event: Gradually Appeared Comorbid History: Hypertension, Type II Diabetes Date Acquired: 12/19/2020 Weeks Of Treatment: 18 Clustered Wound: No Photos Wound Measurements Length: (cm) 1.3 Width: (cm) 1.1 Depth: (cm) 0.5 Area: (cm) 1.123 Volume: (cm) 0.562 % Reduction in Area: 67.5% % Reduction in Volume: 89.2% Epithelialization: Small (1-33%) Undermining: Yes Starting Position  (o'clock): 12 Ending Position (o'clock): 4 Maximum Distance: (cm) 4.5 Wound Description Classification: Category/Stage IV Wound Margin: Well defined, not attached Exudate Amount: Medium Exudate Type: Serosanguineous Exudate Color: red, brown Foul Odor After Cleansing: No Slough/Fibrino Yes Wound Bed Granulation Amount: Large (67-100%) Exposed Structure Granulation Quality: Red, Pink Fascia Exposed: No Necrotic Amount: Small (1-33%) Fat Layer (Subcutaneous Tissue) Exposed: Yes Necrotic Quality: Adherent Slough Tendon Exposed: No Muscle Exposed: No Joint Exposed: No Bone Exposed: No Treatment Notes Wound #1 (Thoracic spine) Cleanser Wound Cleanser Discharge Instruction: Cleanse the wound with wound cleanser prior to applying a clean dressing using gauze sponges, not tissue or cotton balls. Peri-Wound Care Skin Prep Discharge Instruction: Use skin prep as directed Topical Primary Dressing Promogran Prisma Matrix, 4.34 (sq in) (silver collagen) Discharge Instruction: Moisten collagen with saline or hydrogel VAC Secondary Dressing Secured With Compression Wrap Compression Stockings Add-Ons Electronic Signature(s) Signed: 04/30/2022 5:10:34 PM By: Blanche East RN Signed: 04/30/2022 5:44:10 PM By: Baruch Gouty RN, BSN Entered By: Blanche East on 04/30/2022 14:37:28 -------------------------------------------------------------------------------- George Details Patient Name: Date of Service: Stacy Server, Deasia B. 04/30/2022 2:00 PM Medical Record Number: [id number] Patient Account Number: [id number] Date of Birth/Sex: Treating RN: November 05, 1922 (88 y.o. Elam Dutch Primary Care Nejla Reasor: [name] Other Clinician: Referring Fusako Tanabe: Treating Sashia Campas/Extender: Nicholaus Corolla in Treatment: 18 Vital Signs Time Taken: 14:30 Temperature (F): 97.8 Height (in): 62 Pulse (bpm): 79 Weight (lbs): 110 Respiratory Rate (breaths/min):  18 Body Mass Index (BMI): 20.1 Blood Pressure (mmHg): 119/60 Reference Range: 80 - 120 mg / dl Electronic Signature(s) Signed: 04/30/2022 5:44:10 PM By: Baruch Gouty RN, BSN Entered By: Baruch Gouty on 04/30/2022 14:30:29

## 2022-05-01 NOTE — Progress Notes (Signed)
TEGHAN, PHILBIN ([id number]) Visit Report for 04/30/2022 Chief Complaint Document Details Patient Name: Date of Service: [name], [name] 04/30/2022 2:00 PM Medical Record Number: [id number] Patient Account Number: [id number] Date of Birth/Sex: Treating RN: 14-Aug-1923 (88 y.o. Elam Dutch Primary Care Provider: [name] Other Clinician: Referring Provider: Treating Provider/Extender: Nicholaus Corolla in Treatment: 18 Information Obtained from: Patient Chief Complaint Patient is at the clinic for treatment of an open pressure ulcer Electronic Signature(s) Signed: 04/30/2022 2:45:04 PM By: Fredirick Maudlin MD FACS Entered By: Fredirick Maudlin on 04/30/2022 14:45:04 -------------------------------------------------------------------------------- HPI Details Patient Name: Date of Service: [name]. 04/30/2022 2:00 PM Medical Record Number: [id number] Patient Account Number: [id number] Date of Birth/Sex: Treating RN: 01-06-1923 (88 y.o. Elam Dutch Primary Care Provider: [name] Other Clinician: Referring Provider: Treating Provider/Extender: Nicholaus Corolla in Treatment: 18 History of Present Illness HPI Description: ADMISSION 12/19/21 This is a 88 year old woman who is presenting to clinic today with a an open ulcer over her thoracic spine. She has severe kyphosis and a history of prior T12 fracture, as well as type 2 diabetes mellitus. The wound has been present for about a year. She is accompanied by her daughters, 1 of whom is a Marine scientist. They have been trying to offload the site by using pillows and supports to keep returned while in bed, and an eggcrate foam with a cut out for the wound for when she is sitting up. They have been applying Santyl to the site. She does have home health and the provider took a culture which was positive for Pseudomonas. She was on ciprofloxacin for this. A recent repeat culture  was negative. Most recent hemoglobin A1c was 6.5, in February. A C-reactive protein also obtained in February was elevated at 24.1. No imaging has been performed of the site. The patient denies significant pain. 12/25/2021: Last week, I took a bone biopsy as well as culture. Fortunately, the bone biopsy was negative for osteomyelitis. The culture returned with rare corynebacterium and rare Enterococcus faecalis. We have been using Santyl and Hydrofera Blue. T oday, the wound appears cleaner and there are buds of granulation tissue forming around the perimeter. 01/01/2022: Due to the positive culture, we switched to topical gentamicin. Today, she has had a little bit of a slough accumulation. The wound continues to have buds of granulation tissue forming. Apparently, the Hydrofera Blue was placed just over the wound opening rather than down in to contact the surface. No purulent drainage or odor. 01/08/2022: Last week, there was more slough in the wound bed, so I changed back to Santyl. There is now hypertrophic granulation tissue around the wound opening. Once again, we seem to be having some difficulty getting the Hydrofera Blue tucked up into the wound; there is undermining that extends for 3 to 4 cm at the 12 o'clock position and the Hydrofera Blue is simply sitting on top of the wound without being in contact with the surface. Bone is still palpable, but tissue is beginning to close in over it. 01/22/2022: The wound looks much cleaner this week and the hypertrophic granulation tissue responded appropriately to silver nitrate application. She continues to have undermining for about 4 cm from 12:00 to 4:00. I can still feel the bone in the base at about the 2 o'clock position, but it is less prominent and tissue is closing over. We have been using Santyl and Hydrofera Blue. The daughter that accompanies the patient today indicates that  they have been very diligent in making sure the [facility] is  tucked under and into the full base of the wound. 02/05/2022: The wound overall is improved. The undermining is about the same but there is less bone palpable. We have been using Santyl and Hydrofera Blue. Minimal slough accumulation in the 2 weeks since her last visit. 02/19/2022: The visible wound surface is very clean without any slough accumulation. Although I can feel bone in the undermined portion of the wound, it is now covered with a layer of tissue. The undermined area is about the same size, however. We have been using Santyl with Hydrofera Blue. She has been approved for snap VAC use. 02/26/2022: Last week, we applied a snap VAC. The wound dimensions have come in by a bit in all directions. The VAC canister did fill, however, and was starting to leak so the patient's daughter removed the VAC and dressed the wound as we had been previously. There is a little bit of thin slough on the wound surface with some senescent tissue at the inferior margin. Bone is still palpable but covered with a layer of tissue. No odor. 03/06/2022: We continue to have technical difficulties with the snap VAC which resulted in the patient's family removing it and going back to the dressing changes with Hydrofera Blue. It sounds like there are issues maintaining suction and the drape is getting bunched up creating a leak. The wound measured larger today, but the intake nurse thinks that perhaps her measurements were taken slightly differently than last week's. There is just some thin slough on the wound surface as well as some hypertrophic granulation tissue on the lateral border. 03/13/2022: The snap VAC worked much better this week and only began leaking yesterday. At that point, the patient's family removed it and applied Hydrofera Blue to her wound. Today, the tissue coverage over the bone that had been exposed is more substantial. She still has undermining present. There is also some senescent skin around the wound  orifice along with some hypertrophic granulation tissue. 03/20/2022: The snap VAC unfortunately failed on Friday. There has really been no significant change to the wound for several weeks. The degree of undermining is unchanged. The surface is clean. 03/27/2022: Once again, the snap VAC failed prematurely and had to be removed on Saturday. The undermining remains the same. There is more tissue coverage of the previously exposed bone. The wound surface has just a light layer of biofilm and thin slough. She was approved for a standard wound VAC, but there are currently no home health agencies able to staff dressing changes for her; it is too difficult for her and her family to bring her to the wound center more than once a week. 04/03/2022: The snap VAC stayed on but the canister filled up on Sunday. The direct depth of the wound is a little bit shallower but the undermining has not really changed. There is some slightly discolored, darker tissue at the 9 o'clock position. No odor or purulent drainage. 04/11/2022: Using 2 canisters worked well and the snap VAC lasted until yesterday. She has her standard wound VAC with her today. The orifice of the wound has contracted somewhat. There is still substantial undermining at 12:00, but the undermined portion to the right between 2 and 4:00 has closed and somewhat. The previously exposed bone is no longer even palpable under the overlying tissue. No concern for infection. 04/17/2022: A standard VAC dressing was initiated last week. The family has been  changing it at home, due to the inability to secure home health services. It is a little bit challenging to apply, due to the small orifice of the wound with substantial undermining. Today, the undermining at 12:00 seems like it might be a little bit deeper, but the 2-4 o'clock undermining is decreased. There is excellent tissue coverage over the previously exposed bone. There is a little bit of slough accumulation  at the orifice, along with some senescent skin. 04/23/2022: The wound VAC was not changed all week due to the family being overwhelmed with other issues. Fortunately, there does not seem to have been any untoward effects from this. The orifice of the wound remains unchanged but the undermining and tunneling has narrowed to just 1 area at 12:00. No purulent drainage or odor. Good tissue coverage of the previously exposed bone. 04/30/2022: The tunneling has narrowed substantially. There is no purulent drainage or odor from the wound. The orifice of the wound is about the same size and there is some hypertrophic granulation tissue at the opening. Electronic Signature(s) Signed: 04/30/2022 2:45:58 PM By: Fredirick Maudlin MD FACS Entered By: Fredirick Maudlin on 04/30/2022 14:45:58 -------------------------------------------------------------------------------- Chemical Cauterization Details Patient Name: Date of Service: [name]. 04/30/2022 2:00 PM Medical Record Number: [id number] Patient Account Number: [id number] Date of Birth/Sex: Treating RN: 04-05-1923 (88 y.o. Elam Dutch Primary Care Provider: [name] Other Clinician: Referring Provider: Treating Provider/Extender: Nicholaus Corolla in Treatment: 18 Procedure Performed for: Wound #1 Thoracic spine Performed By: Physician Fredirick Maudlin, MD Post Procedure Diagnosis Same as Pre-procedure Notes using silver nitrate stick Electronic Signature(s) Signed: 04/30/2022 3:41:11 PM By: Fredirick Maudlin MD FACS Signed: 04/30/2022 5:44:10 PM By: Baruch Gouty RN, BSN Entered By: Baruch Gouty on 04/30/2022 14:44:24 -------------------------------------------------------------------------------- Physical Exam Details Patient Name: Date of Service: [name]. 04/30/2022 2:00 PM Medical Record Number: [id number] Patient Account Number: [id number] Date of Birth/Sex: Treating RN: 1923-07-29 (88  y.o. Elam Dutch Primary Care Provider: [name] Other Clinician: Referring Provider: Treating Provider/Extender: Nicholaus Corolla in Treatment: 18 Constitutional . . . . No acute distress.Marland Kitchen Respiratory Normal work of breathing on room air.. Notes 04/30/2022: The tunneling has narrowed substantially. There is no purulent drainage or odor from the wound. The orifice of the wound is about the same size and there is some hypertrophic granulation tissue at the opening. Electronic Signature(s) Signed: 04/30/2022 2:46:25 PM By: Fredirick Maudlin MD FACS Entered By: Fredirick Maudlin on 04/30/2022 14:46:25 -------------------------------------------------------------------------------- Physician Orders Details Patient Name: Date of Service: [name]. 04/30/2022 2:00 PM Medical Record Number: [id number] Patient Account Number: [id number] Date of Birth/Sex: Treating RN: October 21, 1922 (88 y.o. Elam Dutch Primary Care Provider: [name] Other Clinician: Referring Provider: Treating Provider/Extender: Nicholaus Corolla in Treatment: [id number] Verbal / Phone Orders: No Diagnosis Coding ICD-10 Coding Code Description L89.104 Pressure ulcer of unspecified part of back, stage 4 E11.622 Type 2 diabetes mellitus with other skin ulcer S22.089S Unspecified fracture of T11-T12 vertebra, sequela Follow-up Appointments ppointment in 1 week. - Dr Celine Ahr - Room 1 - with Vaughan Basta Return A Monday 7/31 @ 10:30 am Bathing/ Shower/ Hygiene May shower with protection but do not get wound dressing(s) wet. Negative Presssure Wound Therapy Wound Vac to wound continuously at 14m/hg pressure Black Foam Off-Loading Turn and reposition every 2 hours Other: - KEEP PRESSURE OFF OF BACK MAY USE EGG CRATE CUSHION Wound Treatment Wound #1 - Thoracic spine Cleanser: Wound Cleanser (Generic)  2 x Per Week/30 Days Discharge Instructions: Cleanse the  wound with wound cleanser prior to applying a clean dressing using gauze sponges, not tissue or cotton balls. Peri-Wound Care: Skin Prep 2 x Per Week/30 Days Discharge Instructions: Use skin prep as directed Prim Dressing: Promogran Prisma Matrix, 4.34 (sq in) (silver collagen) 2 x Per Week/30 Days ary Discharge Instructions: Moisten collagen with saline or hydrogel Prim Dressing: VAC ary 2 x Per Week/30 Days Electronic Signature(s) Signed: 04/30/2022 3:41:11 PM By: Fredirick Maudlin MD FACS Entered By: Fredirick Maudlin on 04/30/2022 14:46:34 -------------------------------------------------------------------------------- Problem List Details Patient Name: Date of Service: [name]. 04/30/2022 2:00 PM Medical Record Number: [id number] Patient Account Number: [id number] Date of Birth/Sex: Treating RN: [birth year] (89 y.o. Elam Dutch Primary Care Provider: [name] Other Clinician: Referring Provider: Treating Provider/Extender: Nicholaus Corolla in Treatment: 18 Active Problems ICD-10 Encounter Code Description Active Date MDM Diagnosis L89.104 Pressure ulcer of unspecified part of back, stage 4 12/19/2021 No Yes E11.622 Type 2 diabetes mellitus with other skin ulcer 12/19/2021 No Yes S22.089S Unspecified fracture of T11-T12 vertebra, sequela 12/19/2021 No Yes Inactive Problems Resolved Problems Electronic Signature(s) Signed: 04/30/2022 2:44:51 PM By: Fredirick Maudlin MD FACS Entered By: Fredirick Maudlin on 04/30/2022 14:44:51 -------------------------------------------------------------------------------- Progress Note Details Patient Name: Date of Service: [name]. 04/30/2022 2:00 PM Medical Record Number: [id number] Patient Account Number: [id number] Date of Birth/Sex: Treating RN: May 21, 1923 (88 y.o. Elam Dutch Primary Care Provider: [name] Other Clinician: Referring Provider: Treating Provider/Extender:  Nicholaus Corolla in Treatment: 18 Subjective Chief Complaint Information obtained from Patient Patient is at the clinic for treatment of an open pressure ulcer History of Present Illness (HPI) ADMISSION 12/19/21 This is a 88 year old woman who is presenting to clinic today with a an open ulcer over her thoracic spine. She has severe kyphosis and a history of prior T12 fracture, as well as type 2 diabetes mellitus. The wound has been present for about a year. She is accompanied by her daughters, 1 of whom is a Marine scientist. They have been trying to offload the site by using pillows and supports to keep returned while in bed, and an eggcrate foam with a cut out for the wound for when she is sitting up. They have been applying Santyl to the site. She does have home health and the provider took a culture which was positive for Pseudomonas. She was on ciprofloxacin for this. A recent repeat culture was negative. Most recent hemoglobin A1c was 6.5, in February. A C-reactive protein also obtained in February was elevated at 24.1. No imaging has been performed of the site. The patient denies significant pain. 12/25/2021: Last week, I took a bone biopsy as well as culture. Fortunately, the bone biopsy was negative for osteomyelitis. The culture returned with rare corynebacterium and rare Enterococcus faecalis. We have been using Santyl and Hydrofera Blue. T oday, the wound appears cleaner and there are buds of granulation tissue forming around the perimeter. 01/01/2022: Due to the positive culture, we switched to topical gentamicin. Today, she has had a little bit of a slough accumulation. The wound continues to have buds of granulation tissue forming. Apparently, the Hydrofera Blue was placed just over the wound opening rather than down in to contact the surface. No purulent drainage or odor. 01/08/2022: Last week, there was more slough in the wound bed, so I changed back to Santyl. There is  now hypertrophic granulation tissue around the wound opening. Once  again, we seem to be having some difficulty getting the Hydrofera Blue tucked up into the wound; there is undermining that extends for 3 to 4 cm at the 12 o'clock position and the Hydrofera Blue is simply sitting on top of the wound without being in contact with the surface. Bone is still palpable, but tissue is beginning to close in over it. 01/22/2022: The wound looks much cleaner this week and the hypertrophic granulation tissue responded appropriately to silver nitrate application. She continues to have undermining for about 4 cm from 12:00 to 4:00. I can still feel the bone in the base at about the 2 o'clock position, but it is less prominent and tissue is closing over. We have been using Santyl and Hydrofera Blue. The daughter that accompanies the patient today indicates that they have been very diligent in making sure the [facility] is tucked under and into the full base of the wound. 02/05/2022: The wound overall is improved. The undermining is about the same but there is less bone palpable. We have been using Santyl and Hydrofera Blue. Minimal slough accumulation in the 2 weeks since her last visit. 02/19/2022: The visible wound surface is very clean without any slough accumulation. Although I can feel bone in the undermined portion of the wound, it is now covered with a layer of tissue. The undermined area is about the same size, however. We have been using Santyl with Hydrofera Blue. She has been approved for snap VAC use. 02/26/2022: Last week, we applied a snap VAC. The wound dimensions have come in by a bit in all directions. The VAC canister did fill, however, and was starting to leak so the patient's daughter removed the VAC and dressed the wound as we had been previously. There is a little bit of thin slough on the wound surface with some senescent tissue at the inferior margin. Bone is still palpable but covered with a  layer of tissue. No odor. 03/06/2022: We continue to have technical difficulties with the snap VAC which resulted in the patient's family removing it and going back to the dressing changes with Hydrofera Blue. It sounds like there are issues maintaining suction and the drape is getting bunched up creating a leak. The wound measured larger today, but the intake nurse thinks that perhaps her measurements were taken slightly differently than last week's. There is just some thin slough on the wound surface as well as some hypertrophic granulation tissue on the lateral border. 03/13/2022: The snap VAC worked much better this week and only began leaking yesterday. At that point, the patient's family removed it and applied Hydrofera Blue to her wound. Today, the tissue coverage over the bone that had been exposed is more substantial. She still has undermining present. There is also some senescent skin around the wound orifice along with some hypertrophic granulation tissue. 03/20/2022: The snap VAC unfortunately failed on Friday. There has really been no significant change to the wound for several weeks. The degree of undermining is unchanged. The surface is clean. 03/27/2022: Once again, the snap VAC failed prematurely and had to be removed on Saturday. The undermining remains the same. There is more tissue coverage of the previously exposed bone. The wound surface has just a light layer of biofilm and thin slough. She was approved for a standard wound VAC, but there are currently no home health agencies able to staff dressing changes for her; it is too difficult for her and her family to bring her to the  wound center more than once a week. 04/03/2022: The snap VAC stayed on but the canister filled up on Sunday. The direct depth of the wound is a little bit shallower but the undermining has not really changed. There is some slightly discolored, darker tissue at the 9 o'clock position. No odor or purulent  drainage. 04/11/2022: Using 2 canisters worked well and the snap VAC lasted until yesterday. She has her standard wound VAC with her today. The orifice of the wound has contracted somewhat. There is still substantial undermining at 12:00, but the undermined portion to the right between 2 and 4:00 has closed and somewhat. The previously exposed bone is no longer even palpable under the overlying tissue. No concern for infection. 04/17/2022: A standard VAC dressing was initiated last week. The family has been changing it at home, due to the inability to secure home health services. It is a little bit challenging to apply, due to the small orifice of the wound with substantial undermining. Today, the undermining at 12:00 seems like it might be a little bit deeper, but the 2-4 o'clock undermining is decreased. There is excellent tissue coverage over the previously exposed bone. There is a little bit of slough accumulation at the orifice, along with some senescent skin. 04/23/2022: The wound VAC was not changed all week due to the family being overwhelmed with other issues. Fortunately, there does not seem to have been any untoward effects from this. The orifice of the wound remains unchanged but the undermining and tunneling has narrowed to just 1 area at 12:00. No purulent drainage or odor. Good tissue coverage of the previously exposed bone. 04/30/2022: The tunneling has narrowed substantially. There is no purulent drainage or odor from the wound. The orifice of the wound is about the same size and there is some hypertrophic granulation tissue at the opening. Patient History Family History Unknown History. Social History Never smoker, Marital Status - Married, Alcohol Use - Never, Drug Use - No History, Caffeine Use - Rarely. Medical History Ear/Nose/Mouth/Throat Denies history of Chronic sinus problems/congestion, Middle ear problems Cardiovascular Patient has history of  Hypertension Endocrine Patient has history of Type II Diabetes Immunological Denies history of Lupus Erythematosus, Raynaudoos, Scleroderma Oncologic Denies history of Received Chemotherapy, Received Radiation Hospitalization/Surgery History - t/11 t/12 FRACTURE/SURGERY. - FEMUR FX SURGERY. - TOTAL HYSTERECTOMY. - CHOLECYSTECTOMY. - TONSILLECTOMY. Medical A Surgical History Notes nd Ear/Nose/Mouth/Throat PARTIAL HEARING LEFT HEAR W/ HEARING AID TOTAL DEAFNESS RIGHT EAR Gastrointestinal Melena Endocrine Hypothyroidism Hyperlipidemia associated with type 2 diabetes mellitus (HCC) Musculoskeletal Closed T12 fracture (HCC) Hip fracture (HCC) Closed comminuted intertrochanteric fracture of proximal end of right femur (HCC) Objective Constitutional No acute distress.. Vitals Time Taken: 2:30 PM, Height: 62 in, Weight: 110 lbs, BMI: 20.1, Temperature: 97.8 F, Pulse: 79 bpm, Respiratory Rate: 18 breaths/min, Blood Pressure: 119/60 mmHg. Respiratory Normal work of breathing on room air.. General Notes: 04/30/2022: The tunneling has narrowed substantially. There is no purulent drainage or odor from the wound. The orifice of the wound is about the same size and there is some hypertrophic granulation tissue at the opening. Integumentary (Hair, Skin) Wound #1 status is Open. Original cause of wound was Gradually Appeared. The date acquired was: 12/19/2020. The wound has been in treatment 18 weeks. The wound is located on the Thoracic spine. The wound measures 1.3cm length x 1.1cm width x 0.5cm depth; 1.123cm^2 area and 0.562cm^3 volume. There is Fat Layer (Subcutaneous Tissue) exposed. There is undermining starting at 12:00 and ending at 4:00  with a maximum distance of 4.5cm. There is a medium amount of serosanguineous drainage noted. The wound margin is well defined and not attached to the wound base. There is large (67-100%) red, pink granulation within the wound bed. There is a small (1-33%)  amount of necrotic tissue within the wound bed including Adherent Slough. Assessment Active Problems ICD-10 Pressure ulcer of unspecified part of back, stage 4 Type 2 diabetes mellitus with other skin ulcer Unspecified fracture of T11-T12 vertebra, sequela Procedures Wound #1 Pre-procedure diagnosis of Wound #1 is a Pressure Ulcer located on the Thoracic spine . An Chemical Cauterization procedure was performed by Fredirick Maudlin, MD. Post procedure Diagnosis Wound #1: Same as Pre-Procedure Notes: using silver nitrate stick Plan Follow-up Appointments: Return Appointment in 1 week. - Dr Celine Ahr - Room 1 - with [facility] Monday 7/31 @ 10:30 am Bathing/ Shower/ Hygiene: May shower with protection but do not get wound dressing(s) wet. Negative Presssure Wound Therapy: Wound Vac to wound continuously at 187m/hg pressure Black Foam Off-Loading: Turn and reposition every 2 hours Other: - KEEP PRESSURE OFF OF BACK MAY USE EGG CRATE CUSHION WOUND #1: - Thoracic spine Wound Laterality: Cleanser: Wound Cleanser (Generic) 2 x Per Week/30 Days Discharge Instructions: Cleanse the wound with wound cleanser prior to applying a clean dressing using gauze sponges, not tissue or cotton balls. Peri-Wound Care: Skin Prep 2 x Per Week/30 Days Discharge Instructions: Use skin prep as directed Prim Dressing: Promogran Prisma Matrix, 4.34 (sq in) (silver collagen) 2 x Per Week/30 Days ary Discharge Instructions: Moisten collagen with saline or hydrogel Prim Dressing: VAC 2 x Per Week/30 Days ary 04/30/2022: The tunneling has narrowed substantially. There is no purulent drainage or odor from the wound. The orifice of the wound is about the same size and there is some hypertrophic granulation tissue at the opening. No debridement was necessary today. I did chemically cauterize the hypertrophic granulation tissue at the orifice of the wound using silver nitrate. We will continue using Prisma silver collagen  with the wound VAC. Follow-up in 1 week. Electronic Signature(s) Signed: 04/30/2022 2:46:59 PM By: CFredirick MaudlinMD FACS Entered By: CFredirick Maudlinon 04/30/2022 14:46:59 -------------------------------------------------------------------------------- HxROS Details Patient Name: Date of Service: HJim LikeB. 04/30/2022 2:00 PM Medical Record Number: [id number] Account Number: [id number] of Birth/Sex: Treating RN: 729-Jun-1924(88y.o. FElam DutchPrimary Care Provider: RJanace LittenOther Clinician: Referring Provider: Treating Provider/Extender: CNicholaus Corollain Treatment: 18 Ear/Nose/Mouth/Throat Medical History: Negative for: Chronic sinus problems/congestion; Middle ear problems Past Medical History Notes: PARTIAL HEARING LEFT HEAR W/ HEARING AID TOTAL DEAFNESS RIGHT EAR Cardiovascular Medical History: Positive for: Hypertension Gastrointestinal Medical History: Past Medical History Notes: Melena Endocrine Medical History: Positive for: Type II Diabetes Past Medical History Notes: Hypothyroidism Hyperlipidemia associated with type 2 diabetes mellitus ([place] Treated with: Diet Immunological Medical History: Negative for: Lupus Erythematosus; Raynauds; Scleroderma Musculoskeletal Medical History: Past Medical History Notes[place] comminuted intertrochanteric fracture of proximal end of right femur ([facility] Oncologic Medical History: Negative for: Received Chemotherapy; Received Radiation Immunizations Pneumococcal Vaccine: Received Pneumococcal Vaccination: Yes Received Pneumococcal Vaccination On or After 60th Birthday: No Implantable Devices None Hospitalization / Surgery History Type of Hospitalization/Surgery t/11 t/12 FRACTURE/SURGERY FEMUR FX SURGERY TOTAL HYSTERECTOMY CHOLECYSTECTOMY TONSILLECTOMY Family and Social History Unknown History: Yes; Never  smoker; Marital Status - Married; Alcohol Use: Never; Drug Use: No History; Caffeine Use: Rarely; Financial Concerns: No; Food, Clothing or Shelter Needs:  No; Support System Lacking: No; Transportation Concerns: No Electronic Signature(s) Signed: 04/30/2022 3:41:11 PM By: Fredirick Maudlin MD FACS Signed: 04/30/2022 5:44:10 PM By: Baruch Gouty RN, BSN Entered By: Fredirick Maudlin on 04/30/2022 14:46:05 -------------------------------------------------------------------------------- [place] Details Patient Name: Date of Service: [name] 04/30/2022 Medical Record Number: [id number] Patient Account Number: [id number] Date of Birth/Sex: Treating RN: 1923/08/15 (88 y.o. 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, stage 4 Modifier: Quantity: Physician Procedures : CPT4 Code Description Modifier [id number] - WC PHYS LEVEL 4 - EST PT ICD-10 Diagnosis Description L89.104 Pressure ulcer of unspecified part of back, stage 4 E11.622 Type 2 diabetes mellitus with other skin ulcer S22.089S Unspecified fracture of  T11-T12 vertebra, sequela Quantity: 1 : [id number] - WC PHYS CHEM CAUT GRAN TISSUE ICD-10 Diagnosis Description L89.104 Pressure ulcer of unspecified part of back, stage 4 Quantity: 1 Electronic Signature(s) Signed: 04/30/2022 5:44:10 PM By: Baruch Gouty RN, BSN Signed: 05/01/2022 7:34:06 AM By: Fredirick Maudlin MD FACS Previous Signature:  04/30/2022 2:47:13 PM Version By: Fredirick Maudlin MD FACS Entered By: Baruch Gouty on 04/30/2022 17:33:27

## 2022-05-07 ENCOUNTER — Encounter (HOSPITAL_BASED_OUTPATIENT_CLINIC_OR_DEPARTMENT_OTHER): Payer: Medicare Other | Admitting: General Surgery

## 2022-05-07 DIAGNOSIS — L89104 Pressure ulcer of unspecified part of back, stage 4: Secondary | ICD-10-CM | POA: Diagnosis not present

## 2022-05-07 NOTE — Progress Notes (Signed)
[name], [name] ([id number]) Visit Report for 05/07/2022 Chief Complaint Document Details Patient Name: Date of Service: [name], [name] 05/07/2022 10:30 A M Medical Record Number: [id number] Patient Account Number: [id number] Date of Birth/Sex: Treating RN: 10-10-1922 (88 y.o. Elam Dutch Primary Care Provider: [name] Other Clinician: Referring Provider: Treating Provider/Extender: Nicholaus Corolla in Treatment: 86 Information Obtained from: Patient Chief Complaint Patient is at the clinic for treatment of an open pressure ulcer Electronic Signature(s) Signed: 05/07/2022 11:19:07 AM By: Fredirick Maudlin MD FACS Entered By: Fredirick Maudlin on 05/07/2022 11:19:07 -------------------------------------------------------------------------------- HPI Details Patient Name: Date of Service: Stacy Like B. 05/07/2022 10:30 A M Medical Record Number: [id number] Patient Account Number: [id number] Date of Birth/Sex: Treating RN: [birth year] (88 y.o. Elam Dutch Primary Care Provider: [name] Other Clinician: Referring Provider: Treating Provider/Extender: Nicholaus Corolla in Treatment: 19 History of Present Illness HPI Description: ADMISSION 12/19/21 This is a 88 year old woman who is presenting to clinic today with a an open ulcer over her thoracic spine. She has severe kyphosis and a history of prior T12 fracture, as well as type 2 diabetes mellitus. The wound has been present for about a year. She is accompanied by her daughters, 1 of whom is a Marine scientist. They have been trying to offload the site by using pillows and supports to keep returned while in bed, and an eggcrate foam with a cut out for the wound for when she is sitting up. They have been applying Santyl to the site. She does have home health and the provider took a culture which was positive for Pseudomonas. She was on ciprofloxacin for this. A recent repeat  culture was negative. Most recent hemoglobin A1c was 6.5, in February. A C-reactive protein also obtained in February was elevated at 24.1. No imaging has been performed of the site. The patient denies significant pain. 12/25/2021: Last week, I took a bone biopsy as well as culture. Fortunately, the bone biopsy was negative for osteomyelitis. The culture returned with rare corynebacterium and rare Enterococcus faecalis. We have been using Santyl and Hydrofera Blue. T oday, the wound appears cleaner and there are buds of granulation tissue forming around the perimeter. 01/01/2022: Due to the positive culture, we switched to topical gentamicin. Today, she has had a little bit of a slough accumulation. The wound continues to have buds of granulation tissue forming. Apparently, the Hydrofera Blue was placed just over the wound opening rather than down in to contact the surface. No purulent drainage or odor. 01/08/2022: Last week, there was more slough in the wound bed, so I changed back to Santyl. There is now hypertrophic granulation tissue around the wound opening. Once again, we seem to be having some difficulty getting the Hydrofera Blue tucked up into the wound; there is undermining that extends for 3 to 4 cm at the 12 o'clock position and the Hydrofera Blue is simply sitting on top of the wound without being in contact with the surface. Bone is still palpable, but tissue is beginning to close in over it. 01/22/2022: The wound looks much cleaner this week and the hypertrophic granulation tissue responded appropriately to silver nitrate application. She continues to have undermining for about 4 cm from 12:00 to 4:00. I can still feel the bone in the base at about the 2 o'clock position, but it is less prominent and tissue is closing over. We have been using Santyl and Hydrofera Blue. The daughter that accompanies the patient today  indicates that they have been very diligent in making sure the [facility] is tucked under and into the full base of the wound. 02/05/2022: The wound overall is improved. The undermining is about the same but there is less bone palpable. We have been using Santyl and Hydrofera Blue. Minimal slough accumulation in the 2 weeks since her last visit. 02/19/2022: The visible wound surface is very clean without any slough accumulation. Although I can feel bone in the undermined portion of the wound, it is now covered with a layer of tissue. The undermined area is about the same size, however. We have been using Santyl with Hydrofera Blue. She has been approved for snap VAC use. 02/26/2022: Last week, we applied a snap VAC. The wound dimensions have come in by a bit in all directions. The VAC canister did fill, however, and was starting to leak so the patient's daughter removed the VAC and dressed the wound as we had been previously. There is a little bit of thin slough on the wound surface with some senescent tissue at the inferior margin. Bone is still palpable but covered with a layer of tissue. No odor. 03/06/2022: We continue to have technical difficulties with the snap VAC which resulted in the patient's family removing it and going back to the dressing changes with Hydrofera Blue. It sounds like there are issues maintaining suction and the drape is getting bunched up creating a leak. The wound measured larger today, but the intake nurse thinks that perhaps her measurements were taken slightly differently than last week's. There is just some thin slough on the wound surface as well as some hypertrophic granulation tissue on the lateral border. 03/13/2022: The snap VAC worked much better this week and only began leaking yesterday. At that point, the patient's family removed it and applied Hydrofera Blue to her wound. Today, the tissue coverage over the bone that had been exposed is more substantial. She still has undermining present. There is also some senescent skin around the  wound orifice along with some hypertrophic granulation tissue. 03/20/2022: The snap VAC unfortunately failed on Friday. There has really been no significant change to the wound for several weeks. The degree of undermining is unchanged. The surface is clean. 03/27/2022: Once again, the snap VAC failed prematurely and had to be removed on Saturday. The undermining remains the same. There is more tissue coverage of the previously exposed bone. The wound surface has just a light layer of biofilm and thin slough. She was approved for a standard wound VAC, but there are currently no home health agencies able to staff dressing changes for her; it is too difficult for her and her family to bring her to the wound center more than once a week. 04/03/2022: The snap VAC stayed on but the canister filled up on Sunday. The direct depth of the wound is a little bit shallower but the undermining has not really changed. There is some slightly discolored, darker tissue at the 9 o'clock position. No odor or purulent drainage. 04/11/2022: Using 2 canisters worked well and the snap VAC lasted until yesterday. She has her standard wound VAC with her today. The orifice of the wound has contracted somewhat. There is still substantial undermining at 12:00, but the undermined portion to the right between 2 and 4:00 has closed and somewhat. The previously exposed bone is no longer even palpable under the overlying tissue. No concern for infection. 04/17/2022: A standard VAC dressing was initiated last week. The family  has been changing it at home, due to the inability to secure home health services. It is a little bit challenging to apply, due to the small orifice of the wound with substantial undermining. Today, the undermining at 12:00 seems like it might be a little bit deeper, but the 2-4 o'clock undermining is decreased. There is excellent tissue coverage over the previously exposed bone. There is a little bit of  slough accumulation at the orifice, along with some senescent skin. 04/23/2022: The wound VAC was not changed all week due to the family being overwhelmed with other issues. Fortunately, there does not seem to have been any untoward effects from this. The orifice of the wound remains unchanged but the undermining and tunneling has narrowed to just 1 area at 12:00. No purulent drainage or odor. Good tissue coverage of the previously exposed bone. 04/30/2022: The tunneling has narrowed substantially. There is no purulent drainage or odor from the wound. The orifice of the wound is about the same size and there is some hypertrophic granulation tissue at the opening. 05/07/2022: The undermining has come in substantially. The only particularly deep area is from 1-2 o'clock. The rest of the wound is clean without any purulent drainage or odor. Electronic Signature(s) Signed: 05/07/2022 11:19:51 AM By: Fredirick Maudlin MD FACS Entered By: Fredirick Maudlin on 05/07/2022 11:19:51 -------------------------------------------------------------------------------- Physical Exam Details Patient Name: Date of Service: Stacy Like B. 05/07/2022 10:30 A M Medical Record Number: [id number] Patient Account Number: [id number] Date of Birth/Sex: Treating RN: Jun 10, 1923 (88 y.o. Elam Dutch Primary Care Provider: [name] Other Clinician: Referring Provider: Treating Provider/Extender: Nicholaus Corolla in Treatment: 19 Constitutional . . . . No acute distress.Marland Kitchen Respiratory Normal work of breathing on room air.. Notes 05/07/2022: The undermining has come in substantially. The only particularly deep area is from 1-2 o'clock. The rest of the wound is clean without any purulent drainage or odor. Electronic Signature(s) Signed: 05/07/2022 11:21:18 AM By: Fredirick Maudlin MD FACS Entered By: Fredirick Maudlin on 05/07/2022  11:21:17 -------------------------------------------------------------------------------- Physician Orders Details Patient Name: Date of Service: Stacy Like B. 05/07/2022 10:30 A M Medical Record Number: [id number] Patient Account Number: [id number] Date of Birth/Sex: Treating RN: 07/02/1923 (88 y.o. Elam Dutch Primary Care Provider: [name] Other Clinician: Referring Provider: Treating Provider/Extender: Nicholaus Corolla in Treatment: 24 Verbal / Phone Orders: No Diagnosis Coding ICD-10 Coding Code Description L89.104 Pressure ulcer of unspecified part of back, stage 4 E11.622 Type 2 diabetes mellitus with other skin ulcer S22.089S Unspecified fracture of T11-T12 vertebra, sequela Follow-up Appointments ppointment in 1 week. - Dr Celine Ahr - Room 1 - with Vaughan Basta Return A Monday 8/7 @ 1:15 Bathing/ Shower/ Hygiene May shower with protection but do not get wound dressing(s) wet. Negative Presssure Wound Therapy Wound Vac to wound continuously at 126m/hg pressure Black Foam Off-Loading Turn and reposition every 2 hours Other: - KEEP PRESSURE OFF OF BACK MAY USE EGG CRATE CUSHION Wound Treatment Wound #1 - Thoracic spine Cleanser: Wound Cleanser (Generic) 2 x Per Week/30 Days Discharge Instructions: Cleanse the wound with wound cleanser prior to applying a clean dressing using gauze sponges, not tissue or cotton balls. Peri-Wound Care: Skin Prep 2 x Per Week/30 Days Discharge Instructions: Use skin prep as directed Prim Dressing: Promogran Prisma Matrix, 4.34 (sq in) (silver collagen) 2 x Per Week/30 Days ary Discharge Instructions: Moisten collagen with saline or hydrogel Prim Dressing: VAC ary 2 x Per Week/30 Days Electronic Signature(s) Signed: 05/07/2022  12:04:04 PM By: Blanche East RN Signed: 05/07/2022 1:47:47 PM By: Fredirick Maudlin MD FACS Previous Signature: 05/07/2022 11:12:09 AM Version By: Fredirick Maudlin MD FACS Entered  By: Blanche East on 05/07/2022 12:00:36 -------------------------------------------------------------------------------- Problem List Details Patient Name: Date of Service: Stacy Like B. 05/07/2022 10:30 A M Medical Record Number: [id number] Patient Account Number: [id number] Date of Birth/Sex: Treating RN: 10-May-1923 (88 y.o. Elam Dutch Primary Care Provider: [name] Other Clinician: Referring Provider: Treating Provider/Extender: Nicholaus Corolla in Treatment: 19 Active Problems ICD-10 Encounter Code Description Active Date MDM Diagnosis L89.104 Pressure ulcer of unspecified part of back, stage 4 12/19/2021 No Yes E11.622 Type 2 diabetes mellitus with other skin ulcer 12/19/2021 No Yes S22.089S Unspecified fracture of T11-T12 vertebra, sequela 12/19/2021 No Yes Inactive Problems Resolved Problems Electronic Signature(s) Signed: 05/07/2022 11:18:56 AM By: Fredirick Maudlin MD FACS Previous Signature: 05/07/2022 11:12:09 AM Version By: Fredirick Maudlin MD FACS Entered By: Fredirick Maudlin on 05/07/2022 11:18:56 -------------------------------------------------------------------------------- Progress Note Details Patient Name: Date of Service: Stacy Like B. 05/07/2022 10:30 A M Medical Record Number: [id number] Patient Account Number: [id number] Date of Birth/Sex: Treating RN: 1923/07/27 (88 y.o. Elam Dutch Primary Care Provider: [name] Other Clinician: Referring Provider: Treating Provider/Extender: Nicholaus Corolla in Treatment: 19 Subjective Chief Complaint Information obtained from Patient Patient is at the clinic for treatment of an open pressure ulcer History of Present Illness (HPI) ADMISSION 12/19/21 This is a 88 year old woman who is presenting to clinic today with a an open ulcer over her thoracic spine. She has severe kyphosis and a history of prior T12 fracture, as well as type 2  diabetes mellitus. The wound has been present for about a year. She is accompanied by her daughters, 1 of whom is a Marine scientist. They have been trying to offload the site by using pillows and supports to keep returned while in bed, and an eggcrate foam with a cut out for the wound for when she is sitting up. They have been applying Santyl to the site. She does have home health and the provider took a culture which was positive for Pseudomonas. She was on ciprofloxacin for this. A recent repeat culture was negative. Most recent hemoglobin A1c was 6.5, in February. A C-reactive protein also obtained in February was elevated at 24.1. No imaging has been performed of the site. The patient denies significant pain. 12/25/2021: Last week, I took a bone biopsy as well as culture. Fortunately, the bone biopsy was negative for osteomyelitis. The culture returned with rare corynebacterium and rare Enterococcus faecalis. We have been using Santyl and Hydrofera Blue. T oday, the wound appears cleaner and there are buds of granulation tissue forming around the perimeter. 01/01/2022: Due to the positive culture, we switched to topical gentamicin. Today, she has had a little bit of a slough accumulation. The wound continues to have buds of granulation tissue forming. Apparently, the Hydrofera Blue was placed just over the wound opening rather than down in to contact the surface. No purulent drainage or odor. 01/08/2022: Last week, there was more slough in the wound bed, so I changed back to Santyl. There is now hypertrophic granulation tissue around the wound opening. Once again, we seem to be having some difficulty getting the Hydrofera Blue tucked up into the wound; there is undermining that extends for 3 to 4 cm at the 12 o'clock position and the Hydrofera Blue is simply sitting on top of the wound without being in contact with  the surface. Bone is still palpable, but tissue is beginning to close in over it. 01/22/2022: The  wound looks much cleaner this week and the hypertrophic granulation tissue responded appropriately to silver nitrate application. She continues to have undermining for about 4 cm from 12:00 to 4:00. I can still feel the bone in the base at about the 2 o'clock position, but it is less prominent and tissue is closing over. We have been using Santyl and Hydrofera Blue. The daughter that accompanies the patient today indicates that they have been very diligent in making sure the [facility] is tucked under and into the full base of the wound. 02/05/2022: The wound overall is improved. The undermining is about the same but there is less bone palpable. We have been using Santyl and Hydrofera Blue. Minimal slough accumulation in the 2 weeks since her last visit. 02/19/2022: The visible wound surface is very clean without any slough accumulation. Although I can feel bone in the undermined portion of the wound, it is now covered with a layer of tissue. The undermined area is about the same size, however. We have been using Santyl with Hydrofera Blue. She has been approved for snap VAC use. 02/26/2022: Last week, we applied a snap VAC. The wound dimensions have come in by a bit in all directions. The VAC canister did fill, however, and was starting to leak so the patient's daughter removed the VAC and dressed the wound as we had been previously. There is a little bit of thin slough on the wound surface with some senescent tissue at the inferior margin. Bone is still palpable but covered with a layer of tissue. No odor. 03/06/2022: We continue to have technical difficulties with the snap VAC which resulted in the patient's family removing it and going back to the dressing changes with Hydrofera Blue. It sounds like there are issues maintaining suction and the drape is getting bunched up creating a leak. The wound measured larger today, but the intake nurse thinks that perhaps her measurements were taken slightly  differently than last week's. There is just some thin slough on the wound surface as well as some hypertrophic granulation tissue on the lateral border. 03/13/2022: The snap VAC worked much better this week and only began leaking yesterday. At that point, the patient's family removed it and applied Hydrofera Blue to her wound. Today, the tissue coverage over the bone that had been exposed is more substantial. She still has undermining present. There is also some senescent skin around the wound orifice along with some hypertrophic granulation tissue. 03/20/2022: The snap VAC unfortunately failed on Friday. There has really been no significant change to the wound for several weeks. The degree of undermining is unchanged. The surface is clean. 03/27/2022: Once again, the snap VAC failed prematurely and had to be removed on Saturday. The undermining remains the same. There is more tissue coverage of the previously exposed bone. The wound surface has just a light layer of biofilm and thin slough. She was approved for a standard wound VAC, but there are currently no home health agencies able to staff dressing changes for her; it is too difficult for her and her family to bring her to the wound center more than once a week. 04/03/2022: The snap VAC stayed on but the canister filled up on Sunday. The direct depth of the wound is a little bit shallower but the undermining has not really changed. There is some slightly discolored, darker tissue at the 9  o'clock position. No odor or purulent drainage. 04/11/2022: Using 2 canisters worked well and the snap VAC lasted until yesterday. She has her standard wound VAC with her today. The orifice of the wound has contracted somewhat. There is still substantial undermining at 12:00, but the undermined portion to the right between 2 and 4:00 has closed and somewhat. The previously exposed bone is no longer even palpable under the overlying tissue. No concern for  infection. 04/17/2022: A standard VAC dressing was initiated last week. The family has been changing it at home, due to the inability to secure home health services. It is a little bit challenging to apply, due to the small orifice of the wound with substantial undermining. Today, the undermining at 12:00 seems like it might be a little bit deeper, but the 2-4 o'clock undermining is decreased. There is excellent tissue coverage over the previously exposed bone. There is a little bit of slough accumulation at the orifice, along with some senescent skin. 04/23/2022: The wound VAC was not changed all week due to the family being overwhelmed with other issues. Fortunately, there does not seem to have been any untoward effects from this. The orifice of the wound remains unchanged but the undermining and tunneling has narrowed to just 1 area at 12:00. No purulent drainage or odor. Good tissue coverage of the previously exposed bone. 04/30/2022: The tunneling has narrowed substantially. There is no purulent drainage or odor from the wound. The orifice of the wound is about the same size and there is some hypertrophic granulation tissue at the opening. 05/07/2022: The undermining has come in substantially. The only particularly deep area is from 1-2 o'clock. The rest of the wound is clean without any purulent drainage or odor. Patient History Family History Unknown History. Social History Never smoker, Marital Status - Married, Alcohol Use - Never, Drug Use - No History, Caffeine Use - Rarely. Medical History Ear/Nose/Mouth/Throat Denies history of Chronic sinus problems/congestion, Middle ear problems Cardiovascular Patient has history of Hypertension Endocrine Patient has history of Type II Diabetes Immunological Denies history of Lupus Erythematosus, Raynaudoos, Scleroderma Oncologic Denies history of Received Chemotherapy, Received Radiation Hospitalization/Surgery History - t/11 t/12  FRACTURE/SURGERY. - FEMUR FX SURGERY. - TOTAL HYSTERECTOMY. - CHOLECYSTECTOMY. - TONSILLECTOMY. Medical A Surgical History Notes nd Ear/Nose/Mouth/Throat PARTIAL HEARING LEFT HEAR W/ HEARING AID TOTAL DEAFNESS RIGHT EAR Gastrointestinal Melena Endocrine Hypothyroidism Hyperlipidemia associated with type 2 diabetes mellitus (HCC) Musculoskeletal Closed T12 fracture (HCC) Hip fracture (HCC) Closed comminuted intertrochanteric fracture of proximal end of right femur (HCC) Objective Constitutional No acute distress.. Vitals Time Taken: 10:54 AM, Height: 62 in, Weight: 110 lbs, BMI: 20.1, Temperature: 97.9 F, Pulse: 85 bpm, Respiratory Rate: 17 breaths/min, Blood Pressure: 110/63 mmHg. Respiratory Normal work of breathing on room air.. General Notes: 05/07/2022: The undermining has come in substantially. The only particularly deep area is from 1-2 o'clock. The rest of the wound is clean without any purulent drainage or odor. Integumentary (Hair, Skin) Wound #1 status is Open. Original cause of wound was Gradually Appeared. The date acquired was: 12/19/2020. The wound has been in treatment 19 weeks. The wound is located on the Thoracic spine. The wound measures 1.2cm length x 1.2cm width x 0.5cm depth; 1.131cm^2 area and 0.565cm^3 volume. There is Fat Layer (Subcutaneous Tissue) exposed. There is no tunneling noted, however, there is undermining starting at 4:00 and ending at 1:00 with a maximum distance of 3.5cm. There is a medium amount of serosanguineous drainage noted. The wound margin is well  defined and not attached to the wound base. There is large (67-100%) red, pink granulation within the wound bed. There is a small (1-33%) amount of necrotic tissue within the wound bed including Adherent Slough. Assessment Active Problems ICD-10 Pressure ulcer of unspecified part of back, stage 4 Type 2 diabetes mellitus with other skin ulcer Unspecified fracture of T11-T12 vertebra,  sequela Plan Follow-up Appointments: Return Appointment in 1 week. - Dr Celine Ahr - Room 1 - with [facility] Monday 8/7 @ 1:15 Bathing/ Shower/ Hygiene: May shower with protection but do not get wound dressing(s) wet. Negative Presssure Wound Therapy: Wound Vac to wound continuously at 144m/hg pressure Black Foam Off-Loading: Turn and reposition every 2 hours Other: - KEEP PRESSURE OFF OF BACK MAY USE EGG CRATE CUSHION WOUND #1: - Thoracic spine Wound Laterality: Cleanser: Wound Cleanser (Generic) 2 x Per Week/30 Days Discharge Instructions: Cleanse the wound with wound cleanser prior to applying a clean dressing using gauze sponges, not tissue or cotton balls. Peri-Wound Care: Skin Prep 2 x Per Week/30 Days Discharge Instructions: Use skin prep as directed Prim Dressing: Promogran Prisma Matrix, 4.34 (sq in) (silver collagen) 2 x Per Week/30 Days ary Discharge Instructions: Moisten collagen with saline or hydrogel Prim Dressing: VAC 2 x Per Week/30 Days ary 05/07/2022: The undermining has come in substantially. The only particularly deep area is from 1-2 o'clock. The rest of the wound is clean without any purulent drainage or odor. No debridement was necessary. We will continue using Prisma silver collagen with the wound VAC. Follow-up in 1 week. Electronic Signature(s) Signed: 05/07/2022 12:04:04 PM By: ZBlanche EastRN Signed: 05/07/2022 1:47:47 PM By: CFredirick MaudlinMD FACS Previous Signature: 05/07/2022 11:21:51 AM Version By: CFredirick MaudlinMD FACS Entered By: ZBlanche Easton 05/07/2022 12:03:28 -------------------------------------------------------------------------------- HxROS Details Patient Name: Date of Service: HJim LikeB. 05/07/2022 10:30 A M Medical Record Number: [id number] Account Number: [id number] of Birth/Sex: Treating RN: [birth year](88y.o. FElam DutchPrimary Care Provider: RJanace LittenOther Clinician: Referring Provider: Treating  Provider/Extender: CNicholaus Corollain Treatment: 19 Ear/Nose/Mouth/Throat Medical History: Negative for: Chronic sinus problems/congestion; Middle ear problems Past Medical History Notes: PARTIAL HEARING LEFT HEAR W/ HEARING AID TOTAL DEAFNESS RIGHT EAR Cardiovascular Medical History: Positive for: Hypertension Gastrointestinal Medical History: Past Medical History Notes: Melena Endocrine Medical History: Positive for: Type II Diabetes Past Medical History Notes: Hypothyroidism Hyperlipidemia associated with type 2 diabetes mellitus ([place] Treated with: Diet Immunological Medical History: Negative for: Lupus Erythematosus; Raynauds; Scleroderma Musculoskeletal Medical History: Past Medical History Notes: [place] Closed comminuted intertrochanteric fracture of proximal end of right femur ([facility] Oncologic Medical History: Negative for: Received Chemotherapy; Received Radiation Immunizations Pneumococcal Vaccine: Received Pneumococcal Vaccination: Yes Received Pneumococcal Vaccination On or After 60th Birthday: No Implantable Devices None Hospitalization / Surgery History Type of Hospitalization/Surgery t/11 t/12 FRACTURE/SURGERY FEMUR FX SURGERY TOTAL HYSTERECTOMY CHOLECYSTECTOMY TONSILLECTOMY Family and Social History Unknown History: Yes; Never smoker; Marital Status - Married; Alcohol Use: Never; Drug Use: No History; Caffeine Use: Rarely; Financial Concerns: No; Food, Clothing or Shelter Needs: No; Support System Lacking: No; Transportation Concerns: 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, stage 4 E11.622 Type 2 diabetes mellitus with other skin ulcer S22.089S Unspecified fracture of T11-T12 vertebra, sequela Facility Procedures CPT4 Code: [id number] Description: 62229 - WOUND VAC-50 SQ CM OR LESS Modifier: Quantity: 1 Physician Procedures : CPT4 Code Description Modifier [id number] - WC PHYS LEVEL 4 - EST PT ICD-10 Diagnosis Description L89.104 Pressure ulcer of unspecified part of back, stage 4 S22.089S Unspecified fracture of T11-T12 vertebra, sequela E11.622 Type 2 diabetes mellitus  with other skin ulcer Quantity: 1 Electronic Signature(s) Signed: 05/07/2022 12:04:04 PM By: Blanche East RN Signed: 05/07/2022 1:47:47 PM By: Fredirick Maudlin MD FACS Previous Signature: 05/07/2022 11:22:04 AM Version By: Fredirick Maudlin MD FACS Entered By: Blanche East on 05/07/2022 12:01:22

## 2022-05-07 NOTE — Progress Notes (Signed)
[name], [name] ([id number]) Visit Report for 05/07/2022 Arrival Information Details Patient Name: Date of Service: [name], [name] 05/07/2022 10:30 A M Medical Record Number: [id number] Patient Account Number: [id number] Date of Birth/Sex: Treating RN: 05/08/1923 (88 y.o. Elam Dutch Primary Care Laksh Hinners: [name] Other Clinician: Referring Kaydince Towles: Treating Cherylee Rawlinson/Extender: Nicholaus Corolla in Treatment: 33 Visit Information History Since Last Visit All ordered tests and consults were completed: Yes Patient Arrived: Wheel Chair Added or deleted any medications: No Arrival Time: 10:54 Any new allergies or adverse reactions: No Accompanied By: daughter Had a fall or experienced change in No Transfer Assistance: Manual activities of daily living that may affect Patient Identification Verified: Yes risk of falls: Secondary Verification Process Completed: Yes Signs or symptoms of abuse/neglect since last visito No Patient Requires Transmission-Based Precautions: No Hospitalized since last visit: No Patient Has Alerts: No Implantable device outside of the clinic excluding No cellular tissue based products placed in the center since last visit: Has Dressing in Place as Prescribed: Yes Pain Present Now: No Electronic Signature(s) Signed: 05/07/2022 12:04:04 PM By: Blanche East RN Entered By: Blanche East on 05/07/2022 10:54:48 -------------------------------------------------------------------------------- Encounter Discharge Information Details Patient Name: Date of Service: Stacy Like B. 05/07/2022 10:30 A M Medical Record Number: [id number] Patient Account Number: [id number] Date of Birth/Sex: Treating RN: [birth year] (88 y.o. Elam Dutch Primary Care Mario Voong: [name] Other Clinician: Referring Ritika Hellickson: Treating Cliff Damiani/Extender: Nicholaus Corolla in Treatment: 19 Encounter Discharge  Information Items Discharge Condition: Stable Ambulatory Status: Wheelchair Discharge Destination: Home Transportation: Private Auto Accompanied By: Daughter Schedule Follow-up Appointment: Yes Clinical Summary of Care: Electronic Signature(s) Signed: 05/07/2022 12:04:04 PM By: Blanche East RN Entered By: Blanche East on 05/07/2022 11:37:00 -------------------------------------------------------------------------------- Lower Extremity Assessment Details Patient Name: Date of Service: [name], [name] 05/07/2022 10:30 A M Medical Record Number: [id number] Patient Account Number: [id number] Date of Birth/Sex: Treating RN: [birth year] (88 y.o. Elam Dutch Primary Care Johnwesley Lederman: [name] Other Clinician: Referring Zanaria Morell: Treating Lismary Kiehn/Extender: Nicholaus Corolla in Treatment: 19 Electronic Signature(s) Signed: 05/07/2022 12:04:04 PM By: Blanche East RN Signed: 05/07/2022 4:54:44 PM By: Baruch Gouty RN, BSN Entered By: Blanche East on 05/07/2022 10:55:39 -------------------------------------------------------------------------------- Multi Wound Chart Details Patient Name: Date of Service: Stacy Like B. 05/07/2022 10:30 A M Medical Record Number: [id number] Patient Account Number: [id number] Date of Birth/Sex: Treating RN: 1923-04-18 (88 y.o. Elam Dutch Primary Care Yardley Beltran: [name] Other Clinician: Referring Numan Zylstra: Treating Jerolyn Flenniken/Extender: Nicholaus Corolla in Treatment: 19 Vital Signs Height(in): 62 Pulse(bpm): 37 Weight(lbs): 110 Blood Pressure(mmHg): 110/63 Body Mass Index(BMI): 20.1 Temperature(F): 97.9 Respiratory Rate(breaths/min): 17 Photos: [1:No Photos Thoracic spine] [N/A:N/A N/A] Wound Location: [1:Gradually Appeared] [N/A:N/A] Wounding Event: [1:Pressure Ulcer] [N/A:N/A] Primary Etiology: [1:Hypertension, Type II Diabetes] [N/A:N/A] Comorbid History:  [1:12/19/2020] [N/A:N/A] Date Acquired: [1:19] [N/A:N/A] Weeks of Treatment: [1:Open] [N/A:N/A] Wound Status: [1:No] [N/A:N/A] Wound Recurrence: [1:1.2x1.2x0.5] [N/A:N/A] Measurements L x W x D (cm) [1:1.131] [N/A:N/A] A (cm) : rea [1:0.565] [N/A:N/A] Volume (cm) : [1:67.30%] [N/A:N/A] % Reduction in A rea: [1:89.10%] [N/A:N/A] % Reduction in Volume: [1:4] Starting Position 1 (o'clock): [1:1] Ending Position 1 (o'clock): [1:3.5] Maximum Distance 1 (cm): [1:Yes] [N/A:N/A] Undermining: [1:Category/Stage IV] [N/A:N/A] Classification: [1:Medium] [N/A:N/A] Exudate A mount: [1:Serosanguineous] [N/A:N/A] Exudate Type: [1:red, brown] [N/A:N/A] Exudate Color: [1:Well defined, not attached] [N/A:N/A] Wound Margin: [1:Large (67-100%)] [N/A:N/A] Granulation A mount: [1:Red, Pink] [N/A:N/A] Granulation Quality: [1:Small (1-33%)] [N/A:N/A] Necrotic A mount: [1:Fat Layer (Subcutaneous Tissue): Yes  N/A] Exposed Structures: [1:Fascia: No Tendon: No Muscle: No Joint: No Bone: No Small (1-33%)] [N/A:N/A] Epithelialization: Treatment Notes Electronic Signature(s) Signed: 05/07/2022 11:19:02 AM By: Fredirick Maudlin MD FACS Signed: 05/07/2022 4:54:44 PM By: Baruch Gouty RN, BSN Entered By: Fredirick Maudlin on 05/07/2022 11:19:01 -------------------------------------------------------------------------------- Multi-Disciplinary Care Plan Details Patient Name: Date of Service: Stacy Like B. 05/07/2022 10:30 A M Medical Record Number: [id number] Patient Account Number: [id number] Date of Birth/Sex: Treating RN: [birth year] (88 y.o. Elam Dutch Primary Care Josselyne Onofrio: [name] Other Clinician: Referring Adama Ivins: Treating Azka Steger/Extender: Nicholaus Corolla in Treatment: 19 Multidisciplinary Care Plan reviewed with physician Active Inactive Pressure Nursing Diagnoses: Knowledge deficit related to causes and risk factors for pressure ulcer  development Knowledge deficit related to management of pressures ulcers Potential for impaired tissue integrity related to pressure, friction, moisture, and shear Goals: Patient will remain free of pressure ulcers Date Initiated: 12/25/2021 Date Inactivated: 04/11/2022 Target Resolution Date: 04/06/2022 Goal Status: Met Patient/caregiver will verbalize understanding of pressure ulcer management Date Initiated: 12/25/2021 Target Resolution Date: 06/07/2022 Goal Status: Active Interventions: Assess: immobility, friction, shearing, incontinence upon admission and as needed Assess offloading mechanisms upon admission and as needed Notes: Wound/Skin Impairment Nursing Diagnoses: Impaired tissue integrity Knowledge deficit related to ulceration/compromised skin integrity Goals: Patient will have a decrease in wound volume by X% from date: (specify in notes) Date Initiated: 12/20/2021 Date Inactivated: 01/08/2022 Target Resolution Date: 01/12/2022 Goal Status: Unmet Unmet Reason: pressure relief Patient/caregiver will verbalize understanding of skin care regimen Date Initiated: 12/20/2021 Target Resolution Date: 06/07/2022 Goal Status: Active Ulcer/skin breakdown will have a volume reduction of 30% by week 4 Date Initiated: 12/20/2021 Date Inactivated: 04/11/2022 Target Resolution Date: 04/06/2022 Goal Status: Unmet Unmet Reason: infection, osteo Ulcer/skin breakdown will have a volume reduction of 50% by week 8 Date Initiated: 12/20/2021 Date Inactivated: 04/11/2022 Target Resolution Date: 04/06/2022 Unmet Reason: osteo, severe Goal Status: Unmet kyphosis Interventions: Assess patient/caregiver ability to obtain necessary supplies Assess patient/caregiver ability to perform ulcer/skin care regimen upon admission and as needed Assess ulceration(s) every visit Notes: Electronic Signature(s) Signed: 05/07/2022 12:04:04 PM By: Blanche East RN Signed: 05/07/2022 4:54:44 PM By: Baruch Gouty RN,  BSN Entered By: Blanche East on 05/07/2022 12:00:40 -------------------------------------------------------------------------------- Negative Pressure Wound Therapy Maintenance (NPWT) Details Patient Name: Date of Service: [name], [name] 05/07/2022 10:30 A M Medical Record Number: [id number] Patient Account Number: [id number] Date of Birth/Sex: Treating RN: [birth year] (88 y.o. Elam Dutch Primary Care Tykel Badie: [name] Other Clinician: Referring Hibah Odonnell: Treating Kaydan Wong/Extender: Nicholaus Corolla in Treatment: 19 NPWT Maintenance Performed for: Wound #1 Thoracic spine Performed By: Baruch Gouty, RN Type: Other Coverage Size (sq cm): 1.44 Pressure Type: Constant Pressure Setting: 125 mmHG Drain Type: None Primary Contact: Non-Adherent Sponge/Dressing Type: Foam, Black Date Initiated: 02/19/2022 Dressing Removed: Yes Quantity of Sponges/Gauze Removed: 2 Canister Changed: No Dressing Reapplied: No Quantity of Sponges/Gauze Inserted: 1 Respones T Treatment: o tolerated well Days On NPWT : 78 Post Procedure Diagnosis Same as Pre-procedure Electronic Signature(s) Signed: 05/07/2022 12:04:04 PM By: Blanche East RN Entered By: Blanche East on 05/07/2022 11:35:30 -------------------------------------------------------------------------------- Pain Assessment Details Patient Name: Date of Service: [name], [name] 05/07/2022 10:30 A M Medical Record Number: [id number] Patient Account Number: [id number] Date of Birth/Sex: Treating RN: [birth year] (88 y.o. Elam Dutch Primary Care Teddi Badalamenti: [name] Other Clinician: Referring Joshva Labreck: Treating Madysun Thall/Extender: Nicholaus Corolla in Treatment: 19 Active Problems Location of Pain Severity and Description of Pain Patient  Has Paino No Site Locations Pain Management and Medication Current Pain Management: Electronic Signature(s) Signed:  05/07/2022 12:04:04 PM By: Blanche East RN Signed: 05/07/2022 4:54:44 PM By: Baruch Gouty RN, BSN Entered By: Blanche East on 05/07/2022 10:55:31 -------------------------------------------------------------------------------- Patient/Caregiver Education Details Patient Name: Date of Service: [name] 7/31/2023andnbsp10:30 [place] Record Number: [id number] Patient Account Number: [id number] Date of Birth/Gender: Treating RN: 12/08/1922 (88 y.o. Elam Dutch Primary Care Physician: [name] Other Clinician: Referring Physician: Treating Physician/Extender: Nicholaus Corolla in Treatment: 19 Education Assessment Education Provided To: Patient Education Topics Provided Wound/Skin Impairment: Responses: Reinforcements needed, State content correctly [employer]) Signed: 05/07/2022 12:04:04 PM By: Blanche East RN Entered By: Blanche East on 05/07/2022 12:00:46 -------------------------------------------------------------------------------- Wound Assessment Details Patient Name: Date of Service: [name], [name] 05/07/2022 10:30 A M Medical Record Number: [id number] Patient Account Number: [id number] Date of Birth/Sex: Treating RN: December 06, 1922 (88 y.o. Elam Dutch Primary Care Sheronda Parran: [name] Other Clinician: Referring Taria Castrillo: Treating Babara Buffalo/Extender: Nicholaus Corolla in Treatment: 19 Wound Status Wound Number: 1 Primary Etiology: Pressure Ulcer Wound Location: Thoracic spine Wound Status: Open Wounding Event: Gradually Appeared Comorbid History: Hypertension, Type II Diabetes Date Acquired: 12/19/2020 Weeks Of Treatment: 19 Clustered Wound: No Wound Measurements Length: (cm) 1.2 Width: (cm) 1.2 Depth: (cm) 0.5 Area: (cm) 1.131 Volume: (cm) 0.565 % Reduction in Area: 67.3% % Reduction in Volume: 89.1% Epithelialization: Small (1-33%) Tunneling: No Undermining:  Yes Starting Position (o'clock): 4 Ending Position (o'clock): 1 Maximum Distance: (cm) 3.5 Wound Description Classification: Category/Stage IV Wound Margin: Well defined, not attached Exudate Amount: Medium Exudate Type: Serosanguineous Exudate Color: red, brown Foul Odor After Cleansing: No Slough/Fibrino Yes Wound Bed Granulation Amount: Large (67-100%) Exposed Structure Granulation Quality: Red, Pink Fascia Exposed: No Necrotic Amount: Small (1-33%) Fat Layer (Subcutaneous Tissue) Exposed: Yes Necrotic Quality: Adherent Slough Tendon Exposed: No Muscle Exposed: No Joint Exposed: No Bone Exposed: No Treatment Notes Wound #1 (Thoracic spine) Cleanser Wound Cleanser Discharge Instruction: Cleanse the wound with wound cleanser prior to applying a clean dressing using gauze sponges, not tissue or cotton balls. Peri-Wound Care Skin Prep Discharge Instruction: Use skin prep as directed Topical Primary Dressing Promogran Prisma Matrix, 4.34 (sq in) (silver collagen) Discharge Instruction: Moisten collagen with saline or hydrogel VAC Secondary Dressing Secured With Compression Wrap Compression Stockings Add-Ons Electronic Signature(s) Signed: 05/07/2022 12:04:04 PM By: Blanche East RN Signed: 05/07/2022 4:54:44 PM By: Baruch Gouty RN, BSN Entered By: Blanche East on 05/07/2022 11:05:11 -------------------------------------------------------------------------------- [place] Details Patient Name: Date of Service: Stacy Like B. 05/07/2022 10:30 A M Medical Record Number: [id number] Patient Account Number: [id number] Date of Birth/Sex: Treating RN: Jun 23, 1923 (88 y.o. Elam Dutch Primary Care Mehtab Dolberry: [name] Other Clinician: Referring Kaspian Muccio: Treating Tamikia Chowning/Extender: Nicholaus Corolla in Treatment: 19 Vital Signs Time Taken: 10:54 Temperature (F): 97.9 Height (in): 62 Pulse (bpm): 85 Weight (lbs): 110 Respiratory  Rate (breaths/min): 17 Body Mass Index (BMI): 20.1 Blood Pressure (mmHg): 110/63 Reference Range: 80 - 120 mg / dl Electronic Signature(s) Signed: 05/07/2022 12:04:04 PM By: Blanche East RN Entered By: Blanche East on 05/07/2022 10:55:15

## 2022-05-14 ENCOUNTER — Encounter (HOSPITAL_BASED_OUTPATIENT_CLINIC_OR_DEPARTMENT_OTHER): Payer: Medicare Other | Attending: General Surgery | Admitting: General Surgery

## 2022-05-14 DIAGNOSIS — S22089S Unspecified fracture of T11-T12 vertebra, sequela: Secondary | ICD-10-CM | POA: Insufficient documentation

## 2022-05-14 DIAGNOSIS — Z8619 Personal history of other infectious and parasitic diseases: Secondary | ICD-10-CM | POA: Diagnosis not present

## 2022-05-14 DIAGNOSIS — L89104 Pressure ulcer of unspecified part of back, stage 4: Secondary | ICD-10-CM | POA: Insufficient documentation

## 2022-05-14 DIAGNOSIS — X58XXXS Exposure to other specified factors, sequela: Secondary | ICD-10-CM | POA: Diagnosis not present

## 2022-05-14 DIAGNOSIS — E11622 Type 2 diabetes mellitus with other skin ulcer: Secondary | ICD-10-CM | POA: Insufficient documentation

## 2022-05-14 NOTE — Progress Notes (Signed)
[name] ([id number]) Visit Report for 05/14/2022 Chief Complaint Document Details Patient Name: Date of Service: [name], [name] 05/14/2022 1:15 PM Medical Record Number: [id number] Patient Account Number: [id number] Date of Birth/Sex: Treating RN: 09-03-1923 (88 y.o. Elam Dutch Primary Care Provider: [name] Other Clinician: Referring Provider: Treating Provider/Extender: Nicholaus Corolla in Treatment: 20 Information Obtained from: Patient Chief Complaint Patient is at the clinic for treatment of an open pressure ulcer Electronic Signature(s) Signed: 05/14/2022 2:09:49 PM By: Fredirick Maudlin MD FACS Entered By: Fredirick Maudlin on 05/14/2022 14:09:49 -------------------------------------------------------------------------------- HPI Details Patient Name: Date of Service: [name] 05/14/2022 1:15 PM Medical Record Number: [id number] Patient Account Number: [id number] Date of Birth/Sex: Treating RN: [birth year] (88 y.o. Elam Dutch Primary Care Provider: [name] Other Clinician: Referring Provider: Treating Provider/Extender: Nicholaus Corolla in Treatment: 20 History of Present Illness HPI Description: ADMISSION 12/19/21 This is a 88 year old woman who is presenting to clinic today with a an open ulcer over her thoracic spine. She has severe kyphosis and a history of prior T12 fracture, as well as type 2 diabetes mellitus. The wound has been present for about a year. She is accompanied by her daughters, 1 of whom is a Marine scientist. They have been trying to offload the site by using pillows and supports to keep returned while in bed, and an eggcrate foam with a cut out for the wound for when she is sitting up. They have been applying Santyl to the site. She does have home health and the provider took a culture which was positive for Pseudomonas. She was on ciprofloxacin for this. A recent repeat culture was  negative. Most recent hemoglobin A1c was 6.5, in February. A C-reactive protein also obtained in February was elevated at 24.1. No imaging has been performed of the site. The patient denies significant pain. 12/25/2021: Last week, I took a bone biopsy as well as culture. Fortunately, the bone biopsy was negative for osteomyelitis. The culture returned with rare corynebacterium and rare Enterococcus faecalis. We have been using Santyl and Hydrofera Blue. T oday, the wound appears cleaner and there are buds of granulation tissue forming around the perimeter. 01/01/2022: Due to the positive culture, we switched to topical gentamicin. Today, she has had a little bit of a slough accumulation. The wound continues to have buds of granulation tissue forming. Apparently, the Hydrofera Blue was placed just over the wound opening rather than down in to contact the surface. No purulent drainage or odor. 01/08/2022: Last week, there was more slough in the wound bed, so I changed back to Santyl. There is now hypertrophic granulation tissue around the wound opening. Once again, we seem to be having some difficulty getting the Hydrofera Blue tucked up into the wound; there is undermining that extends for 3 to 4 cm at the 12 o'clock position and the Hydrofera Blue is simply sitting on top of the wound without being in contact with the surface. Bone is still palpable, but tissue is beginning to close in over it. 01/22/2022: The wound looks much cleaner this week and the hypertrophic granulation tissue responded appropriately to silver nitrate application. She continues to have undermining for about 4 cm from 12:00 to 4:00. I can still feel the bone in the base at about the 2 o'clock position, but it is less prominent and tissue is closing over. We have been using Santyl and Hydrofera Blue. The daughter that accompanies the patient today indicates that  they have been very diligent in making sure the [facility] is tucked  under and into the full base of the wound. 02/05/2022: The wound overall is improved. The undermining is about the same but there is less bone palpable. We have been using Santyl and Hydrofera Blue. Minimal slough accumulation in the 2 weeks since her last visit. 02/19/2022: The visible wound surface is very clean without any slough accumulation. Although I can feel bone in the undermined portion of the wound, it is now covered with a layer of tissue. The undermined area is about the same size, however. We have been using Santyl with Hydrofera Blue. She has been approved for snap VAC use. 02/26/2022: Last week, we applied a snap VAC. The wound dimensions have come in by a bit in all directions. The VAC canister did fill, however, and was starting to leak so the patient's daughter removed the VAC and dressed the wound as we had been previously. There is a little bit of thin slough on the wound surface with some senescent tissue at the inferior margin. Bone is still palpable but covered with a layer of tissue. No odor. 03/06/2022: We continue to have technical difficulties with the snap VAC which resulted in the patient's family removing it and going back to the dressing changes with Hydrofera Blue. It sounds like there are issues maintaining suction and the drape is getting bunched up creating a leak. The wound measured larger today, but the intake nurse thinks that perhaps her measurements were taken slightly differently than last week's. There is just some thin slough on the wound surface as well as some hypertrophic granulation tissue on the lateral border. 03/13/2022: The snap VAC worked much better this week and only began leaking yesterday. At that point, the patient's family removed it and applied Hydrofera Blue to her wound. Today, the tissue coverage over the bone that had been exposed is more substantial. She still has undermining present. There is also some senescent skin around the wound orifice  along with some hypertrophic granulation tissue. 03/20/2022: The snap VAC unfortunately failed on Friday. There has really been no significant change to the wound for several weeks. The degree of undermining is unchanged. The surface is clean. 03/27/2022: Once again, the snap VAC failed prematurely and had to be removed on Saturday. The undermining remains the same. There is more tissue coverage of the previously exposed bone. The wound surface has just a light layer of biofilm and thin slough. She was approved for a standard wound VAC, but there are currently no home health agencies able to staff dressing changes for her; it is too difficult for her and her family to bring her to the wound center more than once a week. 04/03/2022: The snap VAC stayed on but the canister filled up on Sunday. The direct depth of the wound is a little bit shallower but the undermining has not really changed. There is some slightly discolored, darker tissue at the 9 o'clock position. No odor or purulent drainage. 04/11/2022: Using 2 canisters worked well and the snap VAC lasted until yesterday. She has her standard wound VAC with her today. The orifice of the wound has contracted somewhat. There is still substantial undermining at 12:00, but the undermined portion to the right between 2 and 4:00 has closed and somewhat. The previously exposed bone is no longer even palpable under the overlying tissue. No concern for infection. 04/17/2022: A standard VAC dressing was initiated last week. The family has been  changing it at home, due to the inability to secure home health services. It is a little bit challenging to apply, due to the small orifice of the wound with substantial undermining. Today, the undermining at 12:00 seems like it might be a little bit deeper, but the 2-4 o'clock undermining is decreased. There is excellent tissue coverage over the previously exposed bone. There is a little bit of slough accumulation at the  orifice, along with some senescent skin. 04/23/2022: The wound VAC was not changed all week due to the family being overwhelmed with other issues. Fortunately, there does not seem to have been any untoward effects from this. The orifice of the wound remains unchanged but the undermining and tunneling has narrowed to just 1 area at 12:00. No purulent drainage or odor. Good tissue coverage of the previously exposed bone. 04/30/2022: The tunneling has narrowed substantially. There is no purulent drainage or odor from the wound. The orifice of the wound is about the same size and there is some hypertrophic granulation tissue at the opening. 05/07/2022: The undermining has come in substantially. The only particularly deep area is from 1-2 o'clock. The rest of the wound is clean without any purulent drainage or odor. 05/14/2022: The orifice of the wound has contracted further. She continues to have just under 5 cm of tunneling at 12:00, but the wound is clean without any purulent drainage or odor. Electronic Signature(s) Signed: 05/14/2022 2:10:22 PM By: Fredirick Maudlin MD FACS Entered By: Fredirick Maudlin on 05/14/2022 14:10:22 -------------------------------------------------------------------------------- Physical Exam Details Patient Name: Date of Service: [name], [name] 05/14/2022 1:15 PM Medical Record Number: [id number] Patient Account Number: [id number] Date of Birth/Sex: Treating RN: September 23, 1923 (88 y.o. Elam Dutch Primary Care Provider: [name] Other Clinician: Referring Provider: Treating Provider/Extender: Nicholaus Corolla in Treatment: 20 Constitutional . . . . No acute distress.Marland Kitchen Respiratory Normal work of breathing on room air.. Notes 05/14/2022: The orifice of the wound has contracted further. She continues to have just under 5 cm of tunneling at 12:00, but the wound is clean without any purulent drainage or odor. Electronic Signature(s) Signed:  05/14/2022 2:12:55 PM By: Fredirick Maudlin MD FACS Entered By: Fredirick Maudlin on 05/14/2022 14:12:55 -------------------------------------------------------------------------------- Physician Orders Details Patient Name: Date of Service: [name], [name] 05/14/2022 1:15 PM Medical Record Number: [id number] Patient Account Number: [id number] Date of Birth/Sex: Treating RN: 1922-11-25 (88 y.o. Elam Dutch Primary Care Provider: [name] Other Clinician: Referring Provider: Treating Provider/Extender: Nicholaus Corolla in Treatment: 20 Verbal / Phone Orders: No Diagnosis Coding ICD-10 Coding Code Description L89.104 Pressure ulcer of unspecified part of back, stage 4 E11.622 Type 2 diabetes mellitus with other skin ulcer S22.089S Unspecified fracture of T11-T12 vertebra, sequela Follow-up Appointments ppointment in 1 week. - Dr Celine Ahr - Room 1 - with Vaughan Basta Return A Monday 8/14 @ 2;00 PM mONDAY 8/21 '@1'$ :15 PM Bathing/ Shower/ Hygiene May shower with protection but do not get wound dressing(s) wet. Negative Presssure Wound Therapy Wound Vac to wound continuously at 163m/hg pressure Black Foam Off-Loading Turn and reposition every 2 hours Other: - KEEP PRESSURE OFF OF BACK MAY USE EGG CRATE CUSHION Wound Treatment Wound #1 - Thoracic spine Cleanser: Wound Cleanser (Generic) 2 x Per Week/30 Days Discharge Instructions: Cleanse the wound with wound cleanser prior to applying a clean dressing using gauze sponges, not tissue or cotton balls. Peri-Wound Care: Skin Prep 2 x Per Week/30 Days Discharge Instructions: Use skin prep as directed Prim  Dressing: Promogran Prisma Matrix, 4.34 (sq in) (silver collagen) 2 x Per Week/30 Days ary Discharge Instructions: Moisten collagen with saline or hydrogel Prim Dressing: VAC ary 2 x Per Week/30 Days Electronic Signature(s) Signed: 05/14/2022 4:50:13 PM By: Fredirick Maudlin MD FACS Entered By: Fredirick Maudlin on 05/14/2022 14:14:04 -------------------------------------------------------------------------------- Problem List Details Patient Name: Date of Service: [name] 05/14/2022 1:15 PM Medical Record Number: [id number] Patient Account Number: [id number] Date of Birth/Sex: Treating RN: Nov 06, 1922 (88 y.o. Elam Dutch Primary Care Provider: [name] Other Clinician: Referring Provider: Treating Provider/Extender: Nicholaus Corolla in Treatment: 20 Active Problems ICD-10 Encounter Code Description Active Date MDM Diagnosis L89.104 Pressure ulcer of unspecified part of back, stage 4 12/19/2021 No Yes E11.622 Type 2 diabetes mellitus with other skin ulcer 12/19/2021 No Yes S22.089S Unspecified fracture of T11-T12 vertebra, sequela 12/19/2021 No Yes Inactive Problems Resolved Problems Electronic Signature(s) Signed: 05/14/2022 2:09:36 PM By: Fredirick Maudlin MD FACS Entered By: Fredirick Maudlin on 05/14/2022 14:09:36 -------------------------------------------------------------------------------- Progress Note Details Patient Name: Date of Service: [name], [name] 05/14/2022 1:15 PM Medical Record Number: [id number] Patient Account Number: [id number] Date of Birth/Sex: Treating RN: [birth year] (88 y.o. Elam Dutch Primary Care Provider: [name] Other Clinician: Referring Provider: Treating Provider/Extender: Nicholaus Corolla in Treatment: 20 Subjective Chief Complaint Information obtained from Patient Patient is at the clinic for treatment of an open pressure ulcer History of Present Illness (HPI) ADMISSION 12/19/21 This is a 88 year old woman who is presenting to clinic today with a an open ulcer over her thoracic spine. She has severe kyphosis and a history of prior T12 fracture, as well as type 2 diabetes mellitus. The wound has been present for about a year. She is accompanied by her  daughters, 1 of whom is a Marine scientist. They have been trying to offload the site by using pillows and supports to keep returned while in bed, and an eggcrate foam with a cut out for the wound for when she is sitting up. They have been applying Santyl to the site. She does have home health and the provider took a culture which was positive for Pseudomonas. She was on ciprofloxacin for this. A recent repeat culture was negative. Most recent hemoglobin A1c was 6.5, in February. A C-reactive protein also obtained in February was elevated at 24.1. No imaging has been performed of the site. The patient denies significant pain. 12/25/2021: Last week, I took a bone biopsy as well as culture. Fortunately, the bone biopsy was negative for osteomyelitis. The culture returned with rare corynebacterium and rare Enterococcus faecalis. We have been using Santyl and Hydrofera Blue. T oday, the wound appears cleaner and there are buds of granulation tissue forming around the perimeter. 01/01/2022: Due to the positive culture, we switched to topical gentamicin. Today, she has had a little bit of a slough accumulation. The wound continues to have buds of granulation tissue forming. Apparently, the Hydrofera Blue was placed just over the wound opening rather than down in to contact the surface. No purulent drainage or odor. 01/08/2022: Last week, there was more slough in the wound bed, so I changed back to Santyl. There is now hypertrophic granulation tissue around the wound opening. Once again, we seem to be having some difficulty getting the Hydrofera Blue tucked up into the wound; there is undermining that extends for 3 to 4 cm at the 12 o'clock position and the Hydrofera Blue is simply sitting on top of the wound without  being in contact with the surface. Bone is still palpable, but tissue is beginning to close in over it. 01/22/2022: The wound looks much cleaner this week and the hypertrophic granulation tissue responded  appropriately to silver nitrate application. She continues to have undermining for about 4 cm from 12:00 to 4:00. I can still feel the bone in the base at about the 2 o'clock position, but it is less prominent and tissue is closing over. We have been using Santyl and Hydrofera Blue. The daughter that accompanies the patient today indicates that they have been very diligent in making sure the [facility] is tucked under and into the full base of the wound. 02/05/2022: The wound overall is improved. The undermining is about the same but there is less bone palpable. We have been using Santyl and Hydrofera Blue. Minimal slough accumulation in the 2 weeks since her last visit. 02/19/2022: The visible wound surface is very clean without any slough accumulation. Although I can feel bone in the undermined portion of the wound, it is now covered with a layer of tissue. The undermined area is about the same size, however. We have been using Santyl with Hydrofera Blue. She has been approved for snap VAC use. 02/26/2022: Last week, we applied a snap VAC. The wound dimensions have come in by a bit in all directions. The VAC canister did fill, however, and was starting to leak so the patient's daughter removed the VAC and dressed the wound as we had been previously. There is a little bit of thin slough on the wound surface with some senescent tissue at the inferior margin. Bone is still palpable but covered with a layer of tissue. No odor. 03/06/2022: We continue to have technical difficulties with the snap VAC which resulted in the patient's family removing it and going back to the dressing changes with Hydrofera Blue. It sounds like there are issues maintaining suction and the drape is getting bunched up creating a leak. The wound measured larger today, but the intake nurse thinks that perhaps her measurements were taken slightly differently than last week's. There is just some thin slough on the wound surface as  well as some hypertrophic granulation tissue on the lateral border. 03/13/2022: The snap VAC worked much better this week and only began leaking yesterday. At that point, the patient's family removed it and applied Hydrofera Blue to her wound. Today, the tissue coverage over the bone that had been exposed is more substantial. She still has undermining present. There is also some senescent skin around the wound orifice along with some hypertrophic granulation tissue. 03/20/2022: The snap VAC unfortunately failed on Friday. There has really been no significant change to the wound for several weeks. The degree of undermining is unchanged. The surface is clean. 03/27/2022: Once again, the snap VAC failed prematurely and had to be removed on Saturday. The undermining remains the same. There is more tissue coverage of the previously exposed bone. The wound surface has just a light layer of biofilm and thin slough. She was approved for a standard wound VAC, but there are currently no home health agencies able to staff dressing changes for her; it is too difficult for her and her family to bring her to the wound center more than once a week. 04/03/2022: The snap VAC stayed on but the canister filled up on Sunday. The direct depth of the wound is a little bit shallower but the undermining has not really changed. There is some slightly discolored, darker  tissue at the 9 o'clock position. No odor or purulent drainage. 04/11/2022: Using 2 canisters worked well and the snap VAC lasted until yesterday. She has her standard wound VAC with her today. The orifice of the wound has contracted somewhat. There is still substantial undermining at 12:00, but the undermined portion to the right between 2 and 4:00 has closed and somewhat. The previously exposed bone is no longer even palpable under the overlying tissue. No concern for infection. 04/17/2022: A standard VAC dressing was initiated last week. The family has been changing  it at home, due to the inability to secure home health services. It is a little bit challenging to apply, due to the small orifice of the wound with substantial undermining. Today, the undermining at 12:00 seems like it might be a little bit deeper, but the 2-4 o'clock undermining is decreased. There is excellent tissue coverage over the previously exposed bone. There is a little bit of slough accumulation at the orifice, along with some senescent skin. 04/23/2022: The wound VAC was not changed all week due to the family being overwhelmed with other issues. Fortunately, there does not seem to have been any untoward effects from this. The orifice of the wound remains unchanged but the undermining and tunneling has narrowed to just 1 area at 12:00. No purulent drainage or odor. Good tissue coverage of the previously exposed bone. 04/30/2022: The tunneling has narrowed substantially. There is no purulent drainage or odor from the wound. The orifice of the wound is about the same size and there is some hypertrophic granulation tissue at the opening. 05/07/2022: The undermining has come in substantially. The only particularly deep area is from 1-2 o'clock. The rest of the wound is clean without any purulent drainage or odor. 05/14/2022: The orifice of the wound has contracted further. She continues to have just under 5 cm of tunneling at 12:00, but the wound is clean without any purulent drainage or odor. Patient History Family History Unknown History. Social History Never smoker, Marital Status - Married, Alcohol Use - Never, Drug Use - No History, Caffeine Use - Rarely. Medical History Ear/Nose/Mouth/Throat Denies history of Chronic sinus problems/congestion, Middle ear problems Cardiovascular Patient has history of Hypertension Endocrine Patient has history of Type II Diabetes Immunological Denies history of Lupus Erythematosus, Raynaudoos, Scleroderma Oncologic Denies history of Received  Chemotherapy, Received Radiation Hospitalization/Surgery History - t/11 t/12 FRACTURE/SURGERY. - FEMUR FX SURGERY. - TOTAL HYSTERECTOMY. - CHOLECYSTECTOMY. - TONSILLECTOMY. Medical A Surgical History Notes nd Ear/Nose/Mouth/Throat PARTIAL HEARING LEFT HEAR W/ HEARING AID TOTAL DEAFNESS RIGHT EAR Gastrointestinal Melena Endocrine Hypothyroidism Hyperlipidemia associated with type 2 diabetes mellitus (HCC) Musculoskeletal Closed T12 fracture (HCC) Hip fracture (HCC) Closed comminuted intertrochanteric fracture of proximal end of right femur (HCC) Objective Constitutional No acute distress.. Vitals Time Taken: 1:39 PM, Height: 62 in, Weight: 110 lbs, BMI: 20.1, Temperature: 97.8 F, Pulse: 80 bpm, Respiratory Rate: 18 breaths/min, Blood Pressure: 138/66 mmHg. Respiratory Normal work of breathing on room air.. General Notes: 05/14/2022: The orifice of the wound has contracted further. She continues to have just under 5 cm of tunneling at 12:00, but the wound is clean without any purulent drainage or odor. Integumentary (Hair, Skin) Wound #1 status is Open. Original cause of wound was Gradually Appeared. The date acquired was: 12/19/2020. The wound has been in treatment 20 weeks. The wound is located on the Thoracic spine. The wound measures 1.2cm length x 0.9cm width x 1cm depth; 0.848cm^2 area and 0.848cm^3 volume. There is Fat Layer (  Subcutaneous Tissue) exposed. There is no tunneling noted, however, there is undermining starting at 12:00 and ending at 5:00 with a maximum distance of 4.8cm. There is a medium amount of serosanguineous drainage noted. The wound margin is well defined and not attached to the wound base. There is large (67-100%) red, pink granulation within the wound bed. There is a small (1-33%) amount of necrotic tissue within the wound bed including Adherent Slough. Assessment Active Problems ICD-10 Pressure ulcer of unspecified part of back, stage 4 Type 2 diabetes  mellitus with other skin ulcer Unspecified fracture of T11-T12 vertebra, sequela Plan Follow-up Appointments: Return Appointment in 1 week. - Dr Celine Ahr - Room 1 - with [facility] Monday 8/14 @ 2;00 PM mONDAY 8/21 '@1'$ :15 PM Bathing/ Shower/ Hygiene: May shower with protection but do not get wound dressing(s) wet. Negative Presssure Wound Therapy: Wound Vac to wound continuously at 186m/hg pressure Black Foam Off-Loading: Turn and reposition every 2 hours Other: - KEEP PRESSURE OFF OF BACK MAY USE EGG CRATE CUSHION WOUND #1: - Thoracic spine Wound Laterality: Cleanser: Wound Cleanser (Generic) 2 x Per Week/30 Days Discharge Instructions: Cleanse the wound with wound cleanser prior to applying a clean dressing using gauze sponges, not tissue or cotton balls. Peri-Wound Care: Skin Prep 2 x Per Week/30 Days Discharge Instructions: Use skin prep as directed Prim Dressing: Promogran Prisma Matrix, 4.34 (sq in) (silver collagen) 2 x Per Week/30 Days ary Discharge Instructions: Moisten collagen with saline or hydrogel Prim Dressing: VAC 2 x Per Week/30 Days ary 05/14/2022: The orifice of the wound has contracted further. She continues to have just under 5 cm of tunneling at 12:00, but the wound is clean without any purulent drainage or odor. No debridement was necessary. We will continue to use the wound VAC over Prisma silver collagen. Follow-up in 1 week. Electronic Signature(s) Signed: 05/14/2022 2:15:46 PM By: CFredirick MaudlinMD FACS Entered By: CFredirick Maudlinon 05/14/2022 14:15:46 -------------------------------------------------------------------------------- HxROS Details Patient Name: Date of Service: [name], PENCE8/04/2022 1:15 PM Medical Record Number: [id number] Account Number: [id number] of Birth/Sex: Treating RN: 703-Sep-1924(88y.o. FElam DutchPrimary Care Provider: RJanace LittenOther Clinician: Referring Provider: Treating Provider/Extender: CNicholaus Corollain Treatment: 20 Ear/Nose/Mouth/Throat Medical History: Negative for: Chronic sinus problems/congestion; Middle ear problems Past Medical History Notes: PARTIAL HEARING LEFT HEAR W/ HEARING AID TOTAL DEAFNESS RIGHT EAR Cardiovascular Medical History: Positive for: Hypertension Gastrointestinal Medical History: Past Medical History Notes: Melena Endocrine Medical History: Positive for: Type II Diabetes Past Medical History Notes: Hypothyroidism Hyperlipidemia associated with type 2 diabetes mellitus ([place] Treated with: Diet Immunological Medical History: Negative for: Lupus Erythematosus; Raynauds; Scleroderma Musculoskeletal Medical History: Past Medical History Notes: Closed T12 fracture ([place] Hip fracture ([place] Closed comminuted intertrochanteric fracture of proximal end of right femur ([facility] [place] Oncologic Medical History: Negative for: Received Chemotherapy; Received Radiation Immunizations Pneumococcal Vaccine: Received Pneumococcal Vaccination: Yes Received Pneumococcal Vaccination On or After 60th Birthday: No Implantable Devices None Hospitalization / Surgery History Type of Hospitalization/Surgery t/11 t/12 FRACTURE/SURGERY FEMUR FX SURGERY TOTAL HYSTERECTOMY CHOLECYSTECTOMY TONSILLECTOMY Family and Social History Unknown History: Yes; Never smoker; Marital Status - Married; Alcohol Use: Never; Drug Use: No History; Caffeine Use: Rarely; Financial Concerns: No; Food, Clothing or Shelter Needs: No; Support System Lacking: No; Transportation Concerns: No EEngineer, maintenance Signed: 05/14/2022 4:50:13 PM By: CFredirick MaudlinMD FACS Signed: 05/14/2022 5:07:27 PM By: BBaruch GoutyRN, BSN Entered By: CFredirick Maudlinon 05/14/2022 14:12:19 -------------------------------------------------------------------------------- SuperBill Details Patient Name:  Date of Service: [name], [name] 05/14/2022 Medical Record Number:  [id number] Patient Account Number: [id number] Date of Birth/Sex: Treating RN: 08/20/1923 (88 y.o. Elam Dutch Primary Care Provider: [name] Other Clinician: Referring Provider: Treating Provider/Extender: Nicholaus Corolla in Treatment: 20 Diagnosis Coding ICD-10 Codes Code Description L89.104 Pressure ulcer of unspecified part of back, stage 4 E11.622 Type 2 diabetes mellitus with other skin ulcer S22.089S Unspecified fracture of T11-T12 vertebra, sequela Facility Procedures CPT4 Code: [id number] Description: 32951 - WOUND VAC-50 SQ CM OR LESS Modifier: Quantity: 1 Physician Procedures : CPT4 Code Description Modifier [id number] 99214 - WC PHYS LEVEL 4 - EST PT ICD-10 Diagnosis Description L89.104 Pressure ulcer of unspecified part of back, stage 4 E11.622 Type 2 diabetes mellitus with other skin ulcer S22.089S Unspecified fracture of  T11-T12 vertebra, sequela Quantity: 1 Electronic Signature(s) Signed: 05/14/2022 2:16:00 PM By: Fredirick Maudlin MD FACS Entered By: Fredirick Maudlin on 05/14/2022 14:16:00

## 2022-05-14 NOTE — Progress Notes (Signed)
[name], [name] ([id number]) Visit Report for 05/14/2022 Arrival Information Details Patient Name: Date of Service: [name], [name] 05/14/2022 1:15 PM Medical Record Number: [id number] Patient Account Number: [id number] Date of Birth/Sex: Treating RN: 1923-06-07 (88 y.o. Elam Dutch Primary Care Jacoba Cherney: [name] Other Clinician: Referring Kagan Mutchler: Treating Reanna Scoggin/Extender: Nicholaus Corolla in Treatment: 19 Visit Information History Since Last Visit Added or deleted any medications: No Patient Arrived: Wheel Chair Any new allergies or adverse reactions: No Arrival Time: 13:37 Had a fall or experienced change in No Accompanied By: daughter activities of daily living that may affect Transfer Assistance: Manual risk of falls: Patient Identification Verified: Yes Signs or symptoms of abuse/neglect since last visito No Secondary Verification Process Completed: Yes Hospitalized since last visit: No Patient Requires Transmission-Based Precautions: No Implantable device outside of the clinic excluding No Patient Has Alerts: No cellular tissue based products placed in the center since last visit: Has Dressing in Place as Prescribed: Yes Pain Present Now: No Electronic Signature(s) Signed: 05/14/2022 5:07:27 PM By: Baruch Gouty RN, BSN Entered By: Baruch Gouty on 05/14/2022 13:38:50 -------------------------------------------------------------------------------- Encounter Discharge Information Details Patient Name: Date of Service: [name] 05/14/2022 1:15 PM Medical Record Number: [id number] Patient Account Number: [id number] Date of Birth/Sex: Treating RN: 10-Apr-1923 (88 y.o. Elam Dutch Primary Care Carmon Sahli: [name] Other Clinician: Referring Deepika Decatur: Treating Maretta Overdorf/Extender: Nicholaus Corolla in Treatment: 20 Encounter Discharge Information Items Discharge Condition: Stable Ambulatory  Status: Wheelchair Discharge Destination: Home Transportation: Private Auto Accompanied By: daughter Schedule Follow-up Appointment: Yes Clinical Summary of Care: Patient Declined Electronic Signature(s) Signed: 05/14/2022 5:07:27 PM By: Baruch Gouty RN, BSN Entered By: Baruch Gouty on 05/14/2022 14:11:30 -------------------------------------------------------------------------------- Lower Extremity Assessment Details Patient Name: Date of Service: [name], [name] 05/14/2022 1:15 PM Medical Record Number: [id number] Patient Account Number: [id number] Date of Birth/Sex: Treating RN: May 25, 1923 (88 y.o. Elam Dutch Primary Care Quinton Voth: [name] Other Clinician: Referring Olive Zmuda: Treating Soumya Colson/Extender: Nicholaus Corolla in Treatment: 20 Electronic Signature(s) Signed: 05/14/2022 5:07:27 PM By: Baruch Gouty RN, BSN Entered By: Baruch Gouty on 05/14/2022 13:39:40 -------------------------------------------------------------------------------- Multi Wound Chart Details Patient Name: Date of Service: [name] 05/14/2022 1:15 PM Medical Record Number: [id number] Patient Account Number: [id number] Date of Birth/Sex: Treating RN: 1923/06/17 (88 y.o. Elam Dutch Primary Care Guliana Weyandt: [name] Other Clinician: Referring Hassan Blackshire: Treating Romeo Zielinski/Extender: Nicholaus Corolla in Treatment: 20 Vital Signs Height(in): 62 Pulse(bpm): 80 Weight(lbs): 110 Blood Pressure(mmHg): 138/66 Body Mass Index(BMI): 20.1 Temperature(F): 97.8 Respiratory Rate(breaths/min): 18 Photos: [N/A:N/A] Thoracic spine N/A N/A Wound Location: Gradually Appeared N/A N/A Wounding Event: Pressure Ulcer N/A N/A Primary Etiology: Hypertension, Type II Diabetes N/A N/A Comorbid History: 12/19/2020 N/A N/A Date Acquired: 20 N/A N/A Weeks of Treatment: Open N/A N/A Wound Status: No N/A N/A Wound  Recurrence: 1.2x0.9x1 N/A N/A Measurements L x W x D (cm) 0.848 N/A N/A A (cm) : rea 0.848 N/A N/A Volume (cm) : 75.50% N/A N/A % Reduction in A rea: 83.60% N/A N/A % Reduction in Volume: 12 Starting Position 1 (o'clock): 5 Ending Position 1 (o'clock): 4.8 Maximum Distance 1 (cm): Yes N/A N/A Undermining: Category/Stage IV N/A N/A Classification: Medium N/A N/A Exudate A mount: Serosanguineous N/A N/A Exudate Type: red, brown N/A N/A Exudate Color: Well defined, not attached N/A N/A Wound Margin: Large (67-100%) N/A N/A Granulation Amount: Red, Pink N/A N/A Granulation Quality: Small (1-33%) N/A N/A Necrotic Amount: Fat Layer (Subcutaneous  Tissue): Yes N/A N/A Exposed Structures: Fascia: No Tendon: No Muscle: No Joint: No Bone: No Small (1-33%) N/A N/A Epithelialization: Negative Pressure Wound Therapy N/A N/A Procedures Performed: Maintenance (NPWT) Treatment Notes Electronic Signature(s) Signed: 05/14/2022 2:09:43 PM By: Fredirick Maudlin MD FACS Signed: 05/14/2022 5:07:27 PM By: Baruch Gouty RN, BSN Entered By: Fredirick Maudlin on 05/14/2022 14:09:43 -------------------------------------------------------------------------------- Multi-Disciplinary Care Plan Details Patient Name: Date of Service: [name] 05/14/2022 1:15 PM Medical Record Number: [id number] Patient Account Number: [id number] Date of Birth/Sex: Treating RN: [birth year] (88 y.o. Elam Dutch Primary Care Wang Granada: [name] Other Clinician: Referring Skyler Dusing: Treating Luisa Louk/Extender: Nicholaus Corolla in Treatment: 20 Multidisciplinary Care Plan reviewed with physician Active Inactive Pressure Nursing Diagnoses: Knowledge deficit related to causes and risk factors for pressure ulcer development Knowledge deficit related to management of pressures ulcers Potential for impaired tissue integrity related to pressure, friction, moisture,  and shear Goals: Patient will remain free of pressure ulcers Date Initiated: 12/25/2021 Date Inactivated: 04/11/2022 Target Resolution Date: 04/06/2022 Goal Status: Met Patient/caregiver will verbalize understanding of pressure ulcer management Date Initiated: 12/25/2021 Target Resolution Date: 06/07/2022 Goal Status: Active Interventions: Assess: immobility, friction, shearing, incontinence upon admission and as needed Assess offloading mechanisms upon admission and as needed Notes: Wound/Skin Impairment Nursing Diagnoses: Impaired tissue integrity Knowledge deficit related to ulceration/compromised skin integrity Goals: Patient will have a decrease in wound volume by X% from date: (specify in notes) Date Initiated: 12/20/2021 Date Inactivated: 01/08/2022 Target Resolution Date: 01/12/2022 Goal Status: Unmet Unmet Reason: pressure relief Patient/caregiver will verbalize understanding of skin care regimen Date Initiated: 12/20/2021 Target Resolution Date: 06/07/2022 Goal Status: Active Ulcer/skin breakdown will have a volume reduction of 30% by week 4 Date Initiated: 12/20/2021 Date Inactivated: 04/11/2022 Target Resolution Date: 04/06/2022 Goal Status: Unmet Unmet Reason: infection, osteo Ulcer/skin breakdown will have a volume reduction of 50% by week 8 Date Initiated: 12/20/2021 Date Inactivated: 04/11/2022 Target Resolution Date: 04/06/2022 Unmet Reason: osteo, severe Goal Status: Unmet kyphosis Interventions: Assess patient/caregiver ability to obtain necessary supplies Assess patient/caregiver ability to perform ulcer/skin care regimen upon admission and as needed Assess ulceration(s) every visit Notes: Electronic Signature(s) Signed: 05/14/2022 5:07:27 PM By: Baruch Gouty RN, BSN Entered By: Baruch Gouty on 05/14/2022 13:55:00 -------------------------------------------------------------------------------- Negative Pressure Wound Therapy Maintenance (NPWT) Details Patient  Name: Date of Service: [name], [name] 05/14/2022 1:15 PM Medical Record Number: [id number] Patient Account Number: [id number] Date of Birth/Sex: Treating RN: [birth year] (88 y.o. Elam Dutch Primary Care Kamrin Spath: [name] Other Clinician: Referring Oland Arquette: Treating Ashya Nicolaisen/Extender: Nicholaus Corolla in Treatment: 20 NPWT Maintenance Performed for: Wound #1 Thoracic spine Performed By: Baruch Gouty, RN Type: Other Coverage Size (sq cm): 1.08 Pressure Type: Constant Pressure Setting: 125 mmHG Drain Type: None Primary Contact: Other : prisma Sponge/Dressing Type: Foam, Black Date Initiated: 02/19/2022 Dressing Removed: No Quantity of Sponges/Gauze Removed: 1 Canister Changed: No Dressing Reapplied: No Quantity of Sponges/Gauze Inserted: 1 Respones T Treatment: o good Days On NPWT : 85 Post Procedure Diagnosis Same as Pre-procedure Electronic Signature(s) Signed: 05/14/2022 5:07:27 PM By: Baruch Gouty RN, BSN Entered By: Baruch Gouty on 05/14/2022 13:54:48 -------------------------------------------------------------------------------- Pain Assessment Details Patient Name: Date of Service: [name] 05/14/2022 1:15 PM Medical Record Number: [id number] Patient Account Number: [id number] Date of Birth/Sex: Treating RN: 07-09-1923 (88 y.o. Elam Dutch Primary Care Chandrika Sandles: [name] Other Clinician: Referring Lataria Courser: Treating Jahanna Raether/Extender: Nicholaus Corolla in Treatment: 20 Active Problems Location of Pain Severity  and Description of Pain Patient Has Paino Yes Site Locations Pain Location: Pain in Ulcers With Dressing Change: Yes Duration of the Pain. Constant / Intermittento Intermittent Rate the pain. Current Pain Level: 0 Worst Pain Level: 3 Character of Pain Describe the Pain: Tender Pain Management and Medication Current Pain Management: Medication: Yes Is the  Current Pain Management Adequate: Adequate How does your wound impact your activities of daily livingo Sleep: No Bathing: No Appetite: No Relationship With Others: No Bladder Continence: No Emotions: No Bowel Continence: No Work: No Toileting: No Drive: No Dressing: No Hobbies: No Electronic Signature(s) Signed: 05/14/2022 5:07:27 PM By: Baruch Gouty RN, BSN Entered By: Baruch Gouty on 05/14/2022 13:39:33 -------------------------------------------------------------------------------- Patient/Caregiver Education Details Patient Name: Date of Service: [name] 8/7/2023andnbsp1:15 PM Medical Record Number: [id number] Patient Account Number: [id number] Date of Birth/Gender: Treating RN: Dec 06, 1922 (88 y.o. Elam Dutch Primary Care Physician: [name] Other Clinician: Referring Physician: Treating Physician/Extender: Nicholaus Corolla in Treatment: 20 Education Assessment Education Provided To: Patient Education Topics Provided Pressure: Methods: Explain/Verbal Responses: Reinforcements needed, State content correctly Wound/Skin Impairment: Methods: Explain/Verbal Responses: Reinforcements needed, State content correctly Electronic Signature(s) Signed: 05/14/2022 5:07:27 PM By: Baruch Gouty RN, BSN Entered By: Baruch Gouty on 05/14/2022 13:55:21 -------------------------------------------------------------------------------- Wound Assessment Details Patient Name: Date of Service: [name], [name] 05/14/2022 1:15 PM Medical Record Number: [id number] Patient Account Number: [id number] Date of Birth/Sex: Treating RN: 09/11/1923 (88 y.o. Elam Dutch Primary Care Finleigh Cheong: [name] Other Clinician: Referring Fannie Gathright: Treating Shelvy Heckert/Extender: Nicholaus Corolla in Treatment: 20 Wound Status Wound Number: 1 Primary Etiology: Pressure Ulcer Wound Location: Thoracic spine Wound  Status: Open Wounding Event: Gradually Appeared Comorbid History: Hypertension, Type II Diabetes Date Acquired: 12/19/2020 Weeks Of Treatment: 20 Clustered Wound: No Photos Wound Measurements Length: (cm) 1.2 Width: (cm) 0.9 Depth: (cm) 1 Area: (cm) 0.848 Volume: (cm) 0.848 % Reduction in Area: 75.5% % Reduction in Volume: 83.6% Epithelialization: Small (1-33%) Tunneling: No Undermining: Yes Starting Position (o'clock): 12 Ending Position (o'clock): 5 Maximum Distance: (cm) 4.8 Wound Description Classification: Category/Stage IV Wound Margin: Well defined, not attached Exudate Amount: Medium Exudate Type: Serosanguineous Exudate Color: red, brown Foul Odor After Cleansing: No Slough/Fibrino Yes Wound Bed Granulation Amount: Large (67-100%) Exposed Structure Granulation Quality: Red, Pink Fascia Exposed: No Necrotic Amount: Small (1-33%) Fat Layer (Subcutaneous Tissue) Exposed: Yes Necrotic Quality: Adherent Slough Tendon Exposed: No Muscle Exposed: No Joint Exposed: No Bone Exposed: No Treatment Notes Wound #1 (Thoracic spine) Cleanser Wound Cleanser Discharge Instruction: Cleanse the wound with wound cleanser prior to applying a clean dressing using gauze sponges, not tissue or cotton balls. Peri-Wound Care Skin Prep Discharge Instruction: Use skin prep as directed Topical Primary Dressing Promogran Prisma Matrix, 4.34 (sq in) (silver collagen) Discharge Instruction: Moisten collagen with saline or hydrogel VAC Secondary Dressing Secured With Compression Wrap Compression Stockings Add-Ons Electronic Signature(s) Signed: 05/14/2022 5:07:27 PM By: Baruch Gouty RN, BSN Entered By: Baruch Gouty on 05/14/2022 13:47:00 -------------------------------------------------------------------------------- [place] Details Patient Name: Date of Service: Stacy Like B. 05/14/2022 1:15 PM Medical Record Number: [id number] Patient Account Number: [id number] Date  of Birth/Sex: Treating RN: 13-Oct-1922 (88 y.o. Elam Dutch Primary Care Gianluca Chhim: [name] Other Clinician: Referring Vicenta Olds: Treating Hawthorne Day/Extender: Nicholaus Corolla in Treatment: 20 Vital Signs Time Taken: 13:39 Temperature (F): 97.8 Height (in): 62 Pulse (bpm): 80 Weight (lbs): 110 Respiratory Rate (breaths/min): 18 Body Mass Index (BMI): 20.1 Blood Pressure (mmHg): 138/66 Reference Range: 80 -  120 mg / dl Electronic Signature(s) Signed: 05/14/2022 5:07:27 PM By: Baruch Gouty RN, BSN Entered By: Baruch Gouty on 05/14/2022 13:39:10

## 2022-05-21 ENCOUNTER — Encounter (HOSPITAL_BASED_OUTPATIENT_CLINIC_OR_DEPARTMENT_OTHER): Payer: Medicare Other | Admitting: General Surgery

## 2022-05-21 DIAGNOSIS — E11622 Type 2 diabetes mellitus with other skin ulcer: Secondary | ICD-10-CM | POA: Diagnosis not present

## 2022-05-25 NOTE — Progress Notes (Signed)
KARSTEN, VAUGHN ([id number]) Visit Report for 05/21/2022 Arrival Information Details Patient Name: Date of Service: CHENE, KASINGER 05/21/2022 2:00 PM Medical Record Number: [id number] Patient Account Number: [id number] Date of Birth/Sex: Treating RN: Jan 24, 1923 (88 y.o. Elam Dutch Primary Care Ayannah Faddis: [name] Other Clinician: Referring Amor Packard: Treating Arnett Duddy/Extender: Nicholaus Corolla in Treatment: 21 Visit Information History Since Last Visit All ordered tests and consults were completed: Yes Patient Arrived: Wheel Chair Added or deleted any medications: No Arrival Time: 14:33 Any new allergies or adverse reactions: No Accompanied By: daughter Had a fall or experienced change in No Transfer Assistance: EasyPivot Patient Lift activities of daily living that may affect Patient Identification Verified: Yes risk of falls: Secondary Verification Process Completed: Yes Signs or symptoms of abuse/neglect since last visito No Patient Requires Transmission-Based Precautions: No Implantable device outside of the clinic excluding No Patient Has Alerts: No cellular tissue based products placed in the center since last visit: Has Dressing in Place as Prescribed: Yes Pain Present Now: Yes Electronic Signature(s) Signed: 05/21/2022 4:22:45 PM By: Blanche East RN Entered By: Blanche East on 05/21/2022 14:34:07 -------------------------------------------------------------------------------- Encounter Discharge Information Details Patient Name: Date of Service: [name], Kelseigh B. 05/21/2022 2:00 PM Medical Record Number: [id number] Patient Account Number: [id number] Date of Birth/Sex: Treating RN: [birth year] (88 y.o. Elam Dutch Primary Care Vibhav Waddill: [name] Other Clinician: Referring Makel Mcmann: Treating Lemuel Boodram/Extender: Nicholaus Corolla in Treatment: 21 Encounter Discharge Information Items Discharge  Condition: Stable Ambulatory Status: Wheelchair Discharge Destination: Home Transportation: Private Auto Accompanied By: daughter Schedule Follow-up Appointment: Yes Clinical Summary of Care: Electronic Signature(s) Signed: 05/21/2022 4:22:45 PM By: Blanche East RN Entered By: Blanche East on 05/21/2022 14:54:41 -------------------------------------------------------------------------------- Lower Extremity Assessment Details Patient Name: Date of Service: ANUHEA, GASSNER 05/21/2022 2:00 PM Medical Record Number: [id number] Patient Account Number: [id number] Date of Birth/Sex: Treating RN: 1923/05/06 (88 y.o. Elam Dutch Primary Care Lasheba Stevens: [name] Other Clinician: Referring Jill Stopka: Treating Taina Landry/Extender: Nicholaus Corolla in Treatment: 21 Electronic Signature(s) Signed: 05/21/2022 4:22:45 PM By: Blanche East RN Signed: 05/21/2022 4:52:29 PM By: Baruch Gouty RN, BSN Entered By: Blanche East on 05/21/2022 14:35:01 -------------------------------------------------------------------------------- Multi Wound Chart Details Patient Name: Date of Service: [name], Tomie B. 05/21/2022 2:00 PM Medical Record Number: [id number] Patient Account Number: [id number] Date of Birth/Sex: Treating RN: [birth year] (88 y.o. Elam Dutch Primary Care Jaqulyn Chancellor: [name] Other Clinician: Referring Lillyonna Armstead: Treating Caspar Favila/Extender: Nicholaus Corolla in Treatment: 21 Vital Signs Height(in): 62 Pulse(bpm): 92 Weight(lbs): 110 Blood Pressure(mmHg): 134/72 Body Mass Index(BMI): 20.1 Temperature(F): 98.5 Respiratory Rate(breaths/min): 18 Photos: [N/A:N/A] Thoracic spine N/A N/A Wound Location: Gradually Appeared N/A N/A Wounding Event: Pressure Ulcer N/A N/A Primary Etiology: Hypertension, Type II Diabetes N/A N/A Comorbid History: 12/19/2020 N/A N/A Date Acquired: 21 N/A N/A Weeks of  Treatment: Open N/A N/A Wound Status: No N/A N/A Wound Recurrence: 1x1.2x0.7 N/A N/A Measurements L x W x D (cm) 0.942 N/A N/A A (cm) : rea 0.66 N/A N/A Volume (cm) : 72.70% N/A N/A % Reduction in A rea: 87.30% N/A N/A % Reduction in Volume: 10 Starting Position 1 (o'clock): 4 Ending Position 1 (o'clock): 4.2 Maximum Distance 1 (cm): Yes N/A N/A Undermining: Category/Stage IV N/A N/A Classification: Medium N/A N/A Exudate A mount: Serosanguineous N/A N/A Exudate Type: red, brown N/A N/A Exudate Color: Well defined, not attached N/A N/A Wound Margin: Large (67-100%) N/A N/A Granulation Amount: Red, Pink N/A N/A Granulation Quality:  None Present (0%) N/A N/A Necrotic Amount: Fat Layer (Subcutaneous Tissue): Yes N/A N/A Exposed Structures: Fascia: No Tendon: No Muscle: No Joint: No Bone: No Small (1-33%) N/A N/A Epithelialization: Negative Pressure Wound Therapy N/A N/A Procedures Performed: Maintenance (NPWT) Treatment Notes Wound #1 (Thoracic spine) Cleanser Wound Cleanser Discharge Instruction: Cleanse the wound with wound cleanser prior to applying a clean dressing using gauze sponges, not tissue or cotton balls. Peri-Wound Care Skin Prep Discharge Instruction: Use skin prep as directed Topical Primary Dressing Promogran Prisma Matrix, 4.34 (sq in) (silver collagen) Discharge Instruction: Moisten collagen with saline or hydrogel VAC Secondary Dressing Secured With Compression Wrap Compression Stockings Add-Ons Electronic Signature(s) Signed: 05/21/2022 3:22:23 PM By: Fredirick Maudlin MD FACS Signed: 05/21/2022 4:52:29 PM By: Baruch Gouty RN, BSN Entered By: Fredirick Maudlin on 05/21/2022 15:22:23 -------------------------------------------------------------------------------- Multi-Disciplinary Care Plan Details Patient Name: Date of Service: [name], Jawanda B. 05/21/2022 2:00 PM Medical Record Number: [id number] Patient Account Number:  [id number] Date of Birth/Sex: Treating RN: 10-23-1922 (88 y.o. Elam Dutch Primary Care Edgerrin Correia: [name] Other Clinician: Referring Nicklos Gaxiola: Treating Dionta Larke/Extender: Nicholaus Corolla in Treatment: 21 Multidisciplinary Care Plan reviewed with physician Active Inactive Pressure Nursing Diagnoses: Knowledge deficit related to causes and risk factors for pressure ulcer development Knowledge deficit related to management of pressures ulcers Potential for impaired tissue integrity related to pressure, friction, moisture, and shear Goals: Patient will remain free of pressure ulcers Date Initiated: 12/25/2021 Date Inactivated: 04/11/2022 Target Resolution Date: 04/06/2022 Goal Status: Met Patient/caregiver will verbalize understanding of pressure ulcer management Date Initiated: 12/25/2021 Target Resolution Date: 06/07/2022 Goal Status: Active Interventions: Assess: immobility, friction, shearing, incontinence upon admission and as needed Assess offloading mechanisms upon admission and as needed Notes: Wound/Skin Impairment Nursing Diagnoses: Impaired tissue integrity Knowledge deficit related to ulceration/compromised skin integrity Goals: Patient will have a decrease in wound volume by X% from date: (specify in notes) Date Initiated: 12/20/2021 Date Inactivated: 01/08/2022 Target Resolution Date: 01/12/2022 Goal Status: Unmet Unmet Reason: pressure relief Patient/caregiver will verbalize understanding of skin care regimen Date Initiated: 12/20/2021 Target Resolution Date: 06/07/2022 Goal Status: Active Ulcer/skin breakdown will have a volume reduction of 30% by week 4 Date Initiated: 12/20/2021 Date Inactivated: 04/11/2022 Target Resolution Date: 04/06/2022 Goal Status: Unmet Unmet Reason: infection, osteo Ulcer/skin breakdown will have a volume reduction of 50% by week 8 Date Initiated: 12/20/2021 Date Inactivated: 04/11/2022 Target Resolution  Date: 04/06/2022 Unmet Reason: osteo, severe Goal Status: Unmet kyphosis Interventions: Assess patient/caregiver ability to obtain necessary supplies Assess patient/caregiver ability to perform ulcer/skin care regimen upon admission and as needed Assess ulceration(s) every visit Notes: Electronic Signature(s) Signed: 05/21/2022 4:22:45 PM By: Blanche East RN Signed: 05/21/2022 4:52:29 PM By: Baruch Gouty RN, BSN Entered By: Blanche East on 05/21/2022 14:53:34 -------------------------------------------------------------------------------- Negative Pressure Wound Therapy Maintenance (NPWT) Details Patient Name: Date of Service: LAMISHA, ROUSSELL 05/21/2022 2:00 PM Medical Record Number: [id number] Patient Account Number: [id number] Date of Birth/Sex: Treating RN: 02-12-1923 (88 y.o. Elam Dutch Primary Care Ziyan Hillmer: [name] Other Clinician: Referring Tashana Haberl: Treating Breyana Follansbee/Extender: Nicholaus Corolla in Treatment: 21 NPWT Maintenance Performed for: Wound #1 Thoracic spine Performed By: Baruch Gouty, RN Type: Other Coverage Size (sq cm): 1.2 Pressure Type: Constant Pressure Setting: 125 mmHG Drain Type: None Primary Contact: Non-Adherent Sponge/Dressing Type: Foam, Black Date Initiated: 02/19/2022 Dressing Removed: No Quantity of Sponges/Gauze Removed: 1 Canister Changed: No Dressing Reapplied: No Quantity of Sponges/Gauze Inserted: 1 Respones T Treatment: o tolerated well Days On NPWT :  50 Post Procedure Diagnosis Same as Pre-procedure Electronic Signature(s) Signed: 05/21/2022 4:22:45 PM By: Blanche East RN Entered By: Blanche East on 05/21/2022 14:52:16 -------------------------------------------------------------------------------- Pain Assessment Details Patient Name: Date of Service: TAHNI, PORCHIA B. 05/21/2022 2:00 PM Medical Record Number: [id number] Patient Account Number: [id number] Date of  Birth/Sex: Treating RN: 1922/11/11 (88 y.o. Elam Dutch Primary Care Naylea Wigington: [name] Other Clinician: Referring Keyleen Cerrato: Treating Trey Gulbranson/Extender: Nicholaus Corolla in Treatment: 21 Active Problems Location of Pain Severity and Description of Pain Patient Has Paino Yes Site Locations Rate the pain. Current Pain Level: 3 Pain Management and Medication Current Pain Management: Electronic Signature(s) Signed: 05/21/2022 4:22:45 PM By: Blanche East RN Signed: 05/21/2022 4:52:29 PM By: Baruch Gouty RN, BSN Entered By: Blanche East on 05/21/2022 14:34:52 -------------------------------------------------------------------------------- Patient/Caregiver Education Details Patient Name: Date of Service: Marijean Heath 8/14/2023andnbsp2:00 PM Medical Record Number: [id number] Patient Account Number: [id number] Date of Birth/Gender: Treating RN: [birth year] (88 y.o. Elam Dutch Primary Care Physician: [name] Other Clinician: Referring Physician: Treating Physician/Extender: Nicholaus Corolla in Treatment: 21 Education Assessment Education Provided To: Patient Education Topics Provided Wound/Skin Impairment: Methods: Explain/Verbal Responses: Reinforcements needed, State content correctly [employer]) Signed: 05/21/2022 4:22:45 PM By: Blanche East RN Entered By: Blanche East on 05/21/2022 14:53:57 -------------------------------------------------------------------------------- Wound Assessment Details Patient Name: Date of Service: JERRICA, THORMAN B. 05/21/2022 2:00 PM Medical Record Number: [id number] Patient Account Number: [id number] Date of Birth/Sex: Treating RN: 1923/07/25 (88 y.o. Elam Dutch Primary Care Julianah Marciel: [name] Other Clinician: Referring Scout Guyett: Treating Marny Smethers/Extender: Nicholaus Corolla in Treatment: 21 Wound Status Wound  Number: 1 Primary Etiology: Pressure Ulcer Wound Location: Thoracic spine Wound Status: Open Wounding Event: Gradually Appeared Comorbid History: Hypertension, Type II Diabetes Date Acquired: 12/19/2020 Weeks Of Treatment: 21 Clustered Wound: No Photos Wound Measurements Length: (cm) 1 Width: (cm) 1.2 Depth: (cm) 0.7 Area: (cm) 0.942 Volume: (cm) 0.66 % Reduction in Area: 72.7% % Reduction in Volume: 87.3% Epithelialization: Small (1-33%) Tunneling: No Undermining: Yes Starting Position (o'clock): 10 Ending Position (o'clock): 4 Maximum Distance: (cm) 4.2 Wound Description Classification: Category/Stage IV Wound Margin: Well defined, not attached Exudate Amount: Medium Exudate Type: Serosanguineous Exudate Color: red, brown Foul Odor After Cleansing: No Slough/Fibrino Yes Wound Bed Granulation Amount: Large (67-100%) Exposed Structure Granulation Quality: Red, Pink Fascia Exposed: No Necrotic Amount: None Present (0%) Fat Layer (Subcutaneous Tissue) Exposed: Yes Tendon Exposed: No Muscle Exposed: No Joint Exposed: No Bone Exposed: No Treatment Notes Wound #1 (Thoracic spine) Cleanser Wound Cleanser Discharge Instruction: Cleanse the wound with wound cleanser prior to applying a clean dressing using gauze sponges, not tissue or cotton balls. Peri-Wound Care Skin Prep Discharge Instruction: Use skin prep as directed Topical Primary Dressing Promogran Prisma Matrix, 4.34 (sq in) (silver collagen) Discharge Instruction: Moisten collagen with saline or hydrogel VAC Secondary Dressing Secured With Compression Wrap Compression Stockings Add-Ons Electronic Signature(s) Signed: 05/21/2022 4:22:45 PM By: Blanche East RN Signed: 05/21/2022 4:52:29 PM By: Baruch Gouty RN, BSN Entered By: Blanche East on 05/21/2022 14:45:38 -------------------------------------------------------------------------------- [place] Details Patient Name: Date of Service: [name],  Jasmane B. 05/21/2022 2:00 PM Medical Record Number: [id number] Patient Account Number: [id number] Date of Birth/Sex: Treating RN: [birth year] (88 y.o. Elam Dutch Primary Care Alioune Hodgkin: [name] Other Clinician: Referring Athel Merriweather: Treating Veleda Mun/Extender: Nicholaus Corolla in Treatment: 21 Vital Signs Time Taken: 14:34 Temperature (F): 98.5 Height (in): 62 Pulse (bpm): 92 Weight (lbs): 110 Respiratory Rate (breaths/min): 18  Body Mass Index (BMI): 20.1 Blood Pressure (mmHg): 134/72 Reference Range: 80 - 120 mg / dl Electronic Signature(s) Signed: 05/21/2022 4:22:45 PM By: Blanche East RN Entered By: Blanche East on 05/21/2022 14:34:40

## 2022-05-25 NOTE — Progress Notes (Signed)
JAILYN, LEESON ([id number]) Visit Report for 05/21/2022 Chief Complaint Document Details Patient Name: Date of Service: KALISHA, KEADLE 05/21/2022 2:00 PM Medical Record Number: [id number] Patient Account Number: [id number] Date of Birth/Sex: Treating RN: 1923-07-16 (88 y.o. Elam Dutch Primary Care Provider: [name] Other Clinician: Referring Provider: Treating Provider/Extender: Nicholaus Corolla in Treatment: 21 Information Obtained from: Patient Chief Complaint Patient is at the clinic for treatment of an open pressure ulcer Electronic Signature(s) Signed: 05/21/2022 3:22:29 PM By: Fredirick Maudlin MD FACS Entered By: Fredirick Maudlin on 05/21/2022 15:22:29 -------------------------------------------------------------------------------- HPI Details Patient Name: Date of Service: [name] 05/21/2022 2:00 PM Medical Record Number: [id number] Patient Account Number: [id number] Date of Birth/Sex: Treating RN: January 18, 1923 (88 y.o. Elam Dutch Primary Care Provider: [name] Other Clinician: Referring Provider: Treating Provider/Extender: Nicholaus Corolla in Treatment: 21 History of Present Illness HPI Description: ADMISSION 12/19/21 This is a 88 year old woman who is presenting to clinic today with a an open ulcer over her thoracic spine. She has severe kyphosis and a history of prior T12 fracture, as well as type 2 diabetes mellitus. The wound has been present for about a year. She is accompanied by her daughters, 1 of whom is a Marine scientist. They have been trying to offload the site by using pillows and supports to keep returned while in bed, and an eggcrate foam with a cut out for the wound for when she is sitting up. They have been applying Santyl to the site. She does have home health and the provider took a culture which was positive for Pseudomonas. She was on ciprofloxacin for this. A recent repeat culture  was negative. Most recent hemoglobin A1c was 6.5, in February. A C-reactive protein also obtained in February was elevated at 24.1. No imaging has been performed of the site. The patient denies significant pain. 12/25/2021: Last week, I took a bone biopsy as well as culture. Fortunately, the bone biopsy was negative for osteomyelitis. The culture returned with rare corynebacterium and rare Enterococcus faecalis. We have been using Santyl and Hydrofera Blue. T oday, the wound appears cleaner and there are buds of granulation tissue forming around the perimeter. 01/01/2022: Due to the positive culture, we switched to topical gentamicin. Today, she has had a little bit of a slough accumulation. The wound continues to have buds of granulation tissue forming. Apparently, the Hydrofera Blue was placed just over the wound opening rather than down in to contact the surface. No purulent drainage or odor. 01/08/2022: Last week, there was more slough in the wound bed, so I changed back to Santyl. There is now hypertrophic granulation tissue around the wound opening. Once again, we seem to be having some difficulty getting the Hydrofera Blue tucked up into the wound; there is undermining that extends for 3 to 4 cm at the 12 o'clock position and the Hydrofera Blue is simply sitting on top of the wound without being in contact with the surface. Bone is still palpable, but tissue is beginning to close in over it. 01/22/2022: The wound looks much cleaner this week and the hypertrophic granulation tissue responded appropriately to silver nitrate application. She continues to have undermining for about 4 cm from 12:00 to 4:00. I can still feel the bone in the base at about the 2 o'clock position, but it is less prominent and tissue is closing over. We have been using Santyl and Hydrofera Blue. The daughter that accompanies the patient today indicates that  they have been very diligent in making sure the [facility] is  tucked under and into the full base of the wound. 02/05/2022: The wound overall is improved. The undermining is about the same but there is less bone palpable. We have been using Santyl and Hydrofera Blue. Minimal slough accumulation in the 2 weeks since her last visit. 02/19/2022: The visible wound surface is very clean without any slough accumulation. Although I can feel bone in the undermined portion of the wound, it is now covered with a layer of tissue. The undermined area is about the same size, however. We have been using Santyl with Hydrofera Blue. She has been approved for snap VAC use. 02/26/2022: Last week, we applied a snap VAC. The wound dimensions have come in by a bit in all directions. The VAC canister did fill, however, and was starting to leak so the patient's daughter removed the VAC and dressed the wound as we had been previously. There is a little bit of thin slough on the wound surface with some senescent tissue at the inferior margin. Bone is still palpable but covered with a layer of tissue. No odor. 03/06/2022: We continue to have technical difficulties with the snap VAC which resulted in the patient's family removing it and going back to the dressing changes with Hydrofera Blue. It sounds like there are issues maintaining suction and the drape is getting bunched up creating a leak. The wound measured larger today, but the intake nurse thinks that perhaps her measurements were taken slightly differently than last week's. There is just some thin slough on the wound surface as well as some hypertrophic granulation tissue on the lateral border. 03/13/2022: The snap VAC worked much better this week and only began leaking yesterday. At that point, the patient's family removed it and applied Hydrofera Blue to her wound. Today, the tissue coverage over the bone that had been exposed is more substantial. She still has undermining present. There is also some senescent skin around the wound  orifice along with some hypertrophic granulation tissue. 03/20/2022: The snap VAC unfortunately failed on Friday. There has really been no significant change to the wound for several weeks. The degree of undermining is unchanged. The surface is clean. 03/27/2022: Once again, the snap VAC failed prematurely and had to be removed on Saturday. The undermining remains the same. There is more tissue coverage of the previously exposed bone. The wound surface has just a light layer of biofilm and thin slough. She was approved for a standard wound VAC, but there are currently no home health agencies able to staff dressing changes for her; it is too difficult for her and her family to bring her to the wound center more than once a week. 04/03/2022: The snap VAC stayed on but the canister filled up on Sunday. The direct depth of the wound is a little bit shallower but the undermining has not really changed. There is some slightly discolored, darker tissue at the 9 o'clock position. No odor or purulent drainage. 04/11/2022: Using 2 canisters worked well and the snap VAC lasted until yesterday. She has her standard wound VAC with her today. The orifice of the wound has contracted somewhat. There is still substantial undermining at 12:00, but the undermined portion to the right between 2 and 4:00 has closed and somewhat. The previously exposed bone is no longer even palpable under the overlying tissue. No concern for infection. 04/17/2022: A standard VAC dressing was initiated last week. The family has been  changing it at home, due to the inability to secure home health services. It is a little bit challenging to apply, due to the small orifice of the wound with substantial undermining. Today, the undermining at 12:00 seems like it might be a little bit deeper, but the 2-4 o'clock undermining is decreased. There is excellent tissue coverage over the previously exposed bone. There is a little bit of slough accumulation  at the orifice, along with some senescent skin. 04/23/2022: The wound VAC was not changed all week due to the family being overwhelmed with other issues. Fortunately, there does not seem to have been any untoward effects from this. The orifice of the wound remains unchanged but the undermining and tunneling has narrowed to just 1 area at 12:00. No purulent drainage or odor. Good tissue coverage of the previously exposed bone. 04/30/2022: The tunneling has narrowed substantially. There is no purulent drainage or odor from the wound. The orifice of the wound is about the same size and there is some hypertrophic granulation tissue at the opening. 05/07/2022: The undermining has come in substantially. The only particularly deep area is from 1-2 o'clock. The rest of the wound is clean without any purulent drainage or odor. 05/14/2022: The orifice of the wound has contracted further. She continues to have just under 5 cm of tunneling at 12:00, but the wound is clean without any purulent drainage or odor. 05/21/2022: The tunnel has come in by about half a centimeter. The wound is clean without any slough. Electronic Signature(s) Signed: 05/21/2022 3:23:03 PM By: Fredirick Maudlin MD FACS Entered By: Fredirick Maudlin on 05/21/2022 15:23:03 -------------------------------------------------------------------------------- Physical Exam Details Patient Name: Date of Service: [name] 05/21/2022 2:00 PM Medical Record Number: [id number] Patient Account Number: [id number] Date of Birth/Sex: Treating RN: 04/07/1923 (88 y.o. Elam Dutch Primary Care Provider: [name] Other Clinician: Referring Provider: Treating Provider/Extender: Nicholaus Corolla in Treatment: 21 Constitutional . . . . No acute distress.Marland Kitchen Respiratory Normal work of breathing on room air.. Notes 05/21/2022: The tunnel has come in by about half a centimeter. The wound is clean without any  slough. Electronic Signature(s) Signed: 05/21/2022 3:24:08 PM By: Fredirick Maudlin MD FACS Entered By: Fredirick Maudlin on 05/21/2022 15:24:08 -------------------------------------------------------------------------------- Physician Orders Details Patient Name: Date of Service: [name] 05/21/2022 2:00 PM Medical Record Number: [id number] Patient Account Number: [id number] Date of Birth/Sex: Treating RN: 06/11/1923 (88 y.o. Elam Dutch Primary Care Provider: [name] Other Clinician: Referring Provider: Treating Provider/Extender: Nicholaus Corolla in Treatment: 21 Verbal / Phone Orders: No Diagnosis Coding ICD-10 Coding Code Description L89.104 Pressure ulcer of unspecified part of back, stage 4 E11.622 Type 2 diabetes mellitus with other skin ulcer S22.089S Unspecified fracture of T11-T12 vertebra, sequela Follow-up Appointments ppointment in 1 week. - Dr Celine Ahr - Room 1 - with Vaughan Basta Return A [facility] 8/21 '@1'$ :15 PM ppointment in 2 weeks. - Dr Celine Ahr - Room 1 - with Vaughan Basta Return A [facility] 8/28 '@1'$ :15 PM Anesthetic Wound #1 Thoracic spine (In clinic) Topical Lidocaine 5% applied to wound bed Bathing/ Shower/ Hygiene May shower with protection but do not get wound dressing(s) wet. Negative Presssure Wound Therapy Wound Vac to wound continuously at 182m/hg pressure Black Foam Off-Loading Turn and reposition every 2 hours Other: - KEEP PRESSURE OFF OF BACK MAY USE EGG CRATE CUSHION Wound Treatment Wound #1 - Thoracic spine Cleanser: Wound Cleanser (Generic) 2 x Per Week/30 Days Discharge Instructions: Cleanse the wound with wound  cleanser prior to applying a clean dressing using gauze sponges, not tissue or cotton balls. Peri-Wound Care: Skin Prep 2 x Per Week/30 Days Discharge Instructions: Use skin prep as directed Prim Dressing: Promogran Prisma Matrix, 4.34 (sq in) (silver collagen) 2 x Per Week/30 Days ary Discharge  Instructions: Moisten collagen with saline or hydrogel Prim Dressing: VAC ary 2 x Per Week/30 Days Electronic Signature(s) Signed: 05/21/2022 3:30:27 PM By: Fredirick Maudlin MD FACS Entered By: Fredirick Maudlin on 05/21/2022 15:24:24 -------------------------------------------------------------------------------- Problem List Details Patient Name: Date of Service: [name] 05/21/2022 2:00 PM Medical Record Number: [id number] Patient Account Number: [id number] Date of Birth/Sex: Treating RN: 05-23-1923 (88 y.o. Elam Dutch Primary Care Provider: [name] Other Clinician: Referring Provider: Treating Provider/Extender: Nicholaus Corolla in Treatment: 21 Active Problems ICD-10 Encounter Code Description Active Date MDM Diagnosis L89.104 Pressure ulcer of unspecified part of back, stage 4 12/19/2021 No Yes E11.622 Type 2 diabetes mellitus with other skin ulcer 12/19/2021 No Yes S22.089S Unspecified fracture of T11-T12 vertebra, sequela 12/19/2021 No Yes Inactive Problems Resolved Problems Electronic Signature(s) Signed: 05/21/2022 3:21:38 PM By: Fredirick Maudlin MD FACS Entered By: Fredirick Maudlin on 05/21/2022 15:21:37 -------------------------------------------------------------------------------- Progress Note Details Patient Name: Date of Service: [name] 05/21/2022 2:00 PM Medical Record Number: [id number] Patient Account Number: [id number] Date of Birth/Sex: Treating RN: 11-16-1922 (88 y.o. Elam Dutch Primary Care Provider: [name] Other Clinician: Referring Provider: Treating Provider/Extender: Nicholaus Corolla in Treatment: 21 Subjective Chief Complaint Information obtained from Patient Patient is at the clinic for treatment of an open pressure ulcer History of Present Illness (HPI) ADMISSION 12/19/21 This is a 88 year old woman who is presenting to clinic today with a an open  ulcer over her thoracic spine. She has severe kyphosis and a history of prior T12 fracture, as well as type 2 diabetes mellitus. The wound has been present for about a year. She is accompanied by her daughters, 1 of whom is a Marine scientist. They have been trying to offload the site by using pillows and supports to keep returned while in bed, and an eggcrate foam with a cut out for the wound for when she is sitting up. They have been applying Santyl to the site. She does have home health and the provider took a culture which was positive for Pseudomonas. She was on ciprofloxacin for this. A recent repeat culture was negative. Most recent hemoglobin A1c was 6.5, in February. A C-reactive protein also obtained in February was elevated at 24.1. No imaging has been performed of the site. The patient denies significant pain. 12/25/2021: Last week, I took a bone biopsy as well as culture. Fortunately, the bone biopsy was negative for osteomyelitis. The culture returned with rare corynebacterium and rare Enterococcus faecalis. We have been using Santyl and Hydrofera Blue. T oday, the wound appears cleaner and there are buds of granulation tissue forming around the perimeter. 01/01/2022: Due to the positive culture, we switched to topical gentamicin. Today, she has had a little bit of a slough accumulation. The wound continues to have buds of granulation tissue forming. Apparently, the Hydrofera Blue was placed just over the wound opening rather than down in to contact the surface. No purulent drainage or odor. 01/08/2022: Last week, there was more slough in the wound bed, so I changed back to Santyl. There is now hypertrophic granulation tissue around the wound opening. Once again, we seem to be having some difficulty getting the Hydrofera Blue tucked  up into the wound; there is undermining that extends for 3 to 4 cm at the 12 o'clock position and the [facility] is simply sitting on top of the wound without being in  contact with the surface. Bone is still palpable, but tissue is beginning to close in over it. 01/22/2022: The wound looks much cleaner this week and the hypertrophic granulation tissue responded appropriately to silver nitrate application. She continues to have undermining for about 4 cm from 12:00 to 4:00. I can still feel the bone in the base at about the 2 o'clock position, but it is less prominent and tissue is closing over. We have been using Santyl and Hydrofera Blue. The daughter that accompanies the patient today indicates that they have been very diligent in making sure the [facility] is tucked under and into the full base of the wound. 02/05/2022: The wound overall is improved. The undermining is about the same but there is less bone palpable. We have been using Santyl and Hydrofera Blue. Minimal slough accumulation in the 2 weeks since her last visit. 02/19/2022: The visible wound surface is very clean without any slough accumulation. Although I can feel bone in the undermined portion of the wound, it is now covered with a layer of tissue. The undermined area is about the same size, however. We have been using Santyl with Hydrofera Blue. She has been approved for snap VAC use. 02/26/2022: Last week, we applied a snap VAC. The wound dimensions have come in by a bit in all directions. The VAC canister did fill, however, and was starting to leak so the patient's daughter removed the VAC and dressed the wound as we had been previously. There is a little bit of thin slough on the wound surface with some senescent tissue at the inferior margin. Bone is still palpable but covered with a layer of tissue. No odor. 03/06/2022: We continue to have technical difficulties with the snap VAC which resulted in the patient's family removing it and going back to the dressing changes with Hydrofera Blue. It sounds like there are issues maintaining suction and the drape is getting bunched up creating a leak.  The wound measured larger today, but the intake nurse thinks that perhaps her measurements were taken slightly differently than last week's. There is just some thin slough on the wound surface as well as some hypertrophic granulation tissue on the lateral border. 03/13/2022: The snap VAC worked much better this week and only began leaking yesterday. At that point, the patient's family removed it and applied Hydrofera Blue to her wound. Today, the tissue coverage over the bone that had been exposed is more substantial. She still has undermining present. There is also some senescent skin around the wound orifice along with some hypertrophic granulation tissue. 03/20/2022: The snap VAC unfortunately failed on Friday. There has really been no significant change to the wound for several weeks. The degree of undermining is unchanged. The surface is clean. 03/27/2022: Once again, the snap VAC failed prematurely and had to be removed on Saturday. The undermining remains the same. There is more tissue coverage of the previously exposed bone. The wound surface has just a light layer of biofilm and thin slough. She was approved for a standard wound VAC, but there are currently no home health agencies able to staff dressing changes for her; it is too difficult for her and her family to bring her to the wound center more than once a week. 04/03/2022: The snap VAC stayed  on but the canister filled up on Sunday. The direct depth of the wound is a little bit shallower but the undermining has not really changed. There is some slightly discolored, darker tissue at the 9 o'clock position. No odor or purulent drainage. 04/11/2022: Using 2 canisters worked well and the snap VAC lasted until yesterday. She has her standard wound VAC with her today. The orifice of the wound has contracted somewhat. There is still substantial undermining at 12:00, but the undermined portion to the right between 2 and 4:00 has closed and  somewhat. The previously exposed bone is no longer even palpable under the overlying tissue. No concern for infection. 04/17/2022: A standard VAC dressing was initiated last week. The family has been changing it at home, due to the inability to secure home health services. It is a little bit challenging to apply, due to the small orifice of the wound with substantial undermining. Today, the undermining at 12:00 seems like it might be a little bit deeper, but the 2-4 o'clock undermining is decreased. There is excellent tissue coverage over the previously exposed bone. There is a little bit of slough accumulation at the orifice, along with some senescent skin. 04/23/2022: The wound VAC was not changed all week due to the family being overwhelmed with other issues. Fortunately, there does not seem to have been any untoward effects from this. The orifice of the wound remains unchanged but the undermining and tunneling has narrowed to just 1 area at 12:00. No purulent drainage or odor. Good tissue coverage of the previously exposed bone. 04/30/2022: The tunneling has narrowed substantially. There is no purulent drainage or odor from the wound. The orifice of the wound is about the same size and there is some hypertrophic granulation tissue at the opening. 05/07/2022: The undermining has come in substantially. The only particularly deep area is from 1-2 o'clock. The rest of the wound is clean without any purulent drainage or odor. 05/14/2022: The orifice of the wound has contracted further. She continues to have just under 5 cm of tunneling at 12:00, but the wound is clean without any purulent drainage or odor. 05/21/2022: The tunnel has come in by about half a centimeter. The wound is clean without any slough. Patient History Family History Unknown History. Social History Never smoker, Marital Status - Married, Alcohol Use - Never, Drug Use - No History, Caffeine Use - Rarely. Medical  History Ear/Nose/Mouth/Throat Denies history of Chronic sinus problems/congestion, Middle ear problems Cardiovascular Patient has history of Hypertension Endocrine Patient has history of Type II Diabetes Immunological Denies history of Lupus Erythematosus, Raynaudoos, Scleroderma Oncologic Denies history of Received Chemotherapy, Received Radiation Hospitalization/Surgery History - t/11 t/12 FRACTURE/SURGERY. - FEMUR FX SURGERY. - TOTAL HYSTERECTOMY. - CHOLECYSTECTOMY. - TONSILLECTOMY. Medical A Surgical History Notes nd Ear/Nose/Mouth/Throat PARTIAL HEARING LEFT HEAR W/ HEARING AID TOTAL DEAFNESS RIGHT EAR Gastrointestinal Melena Endocrine Hypothyroidism Hyperlipidemia associated with type 2 diabetes mellitus (HCC) Musculoskeletal Closed T12 fracture (HCC) Hip fracture (HCC) Closed comminuted intertrochanteric fracture of proximal end of right femur (HCC) Objective Constitutional No acute distress.. Vitals Time Taken: 2:34 PM, Height: 62 in, Weight: 110 lbs, BMI: 20.1, Temperature: 98.5 F, Pulse: 92 bpm, Respiratory Rate: 18 breaths/min, Blood Pressure: 134/72 mmHg. Respiratory Normal work of breathing on room air.. General Notes: 05/21/2022: The tunnel has come in by about half a centimeter. The wound is clean without any slough. Integumentary (Hair, Skin) Wound #1 status is Open. Original cause of wound was Gradually Appeared. The date acquired was: 12/19/2020.  The wound has been in treatment 21 weeks. The wound is located on the Thoracic spine. The wound measures 1cm length x 1.2cm width x 0.7cm depth; 0.942cm^2 area and 0.66cm^3 volume. There is Fat Layer (Subcutaneous Tissue) exposed. There is no tunneling noted, however, there is undermining starting at 10:00 and ending at 4:00 with a maximum distance of 4.2cm. There is a medium amount of serosanguineous drainage noted. The wound margin is well defined and not attached to the wound base. There is large (67-100%) red, pink  granulation within the wound bed. There is no necrotic tissue within the wound bed. Assessment Active Problems ICD-10 Pressure ulcer of unspecified part of back, stage 4 Type 2 diabetes mellitus with other skin ulcer Unspecified fracture of T11-T12 vertebra, sequela Plan Follow-up Appointments: Return Appointment in 1 week. - Dr Celine Ahr - Room 1 - with Lynden Oxford 8/21 '@1'$ :15 PM Return Appointment in 2 weeks. - Dr Celine Ahr - Room 1 - with Lynden Oxford 8/28 '@1'$ :15 PM Anesthetic: Wound #1 Thoracic spine: (In clinic) Topical Lidocaine 5% applied to wound bed Bathing/ Shower/ Hygiene: May shower with protection but do not get wound dressing(s) wet. Negative Presssure Wound Therapy: Wound Vac to wound continuously at 125m/hg pressure Black Foam Off-Loading: Turn and reposition every 2 hours Other: - KEEP PRESSURE OFF OF BACK MAY USE EGG CRATE CUSHION WOUND #1: - Thoracic spine Wound Laterality: Cleanser: Wound Cleanser (Generic) 2 x Per Week/30 Days Discharge Instructions: Cleanse the wound with wound cleanser prior to applying a clean dressing using gauze sponges, not tissue or cotton balls. Peri-Wound Care: Skin Prep 2 x Per Week/30 Days Discharge Instructions: Use skin prep as directed Prim Dressing: Promogran Prisma Matrix, 4.34 (sq in) (silver collagen) 2 x Per Week/30 Days ary Discharge Instructions: Moisten collagen with saline or hydrogel Prim Dressing: VAC 2 x Per Week/30 Days ary 05/21/2022: The tunnel has come in by about half a centimeter. The wound is clean without any slough. No debridement was necessary today. We will continue using Prisma silver collagen under the wound VAC. We have been cutting the sponge for the patient's family so that they are getting an appropriate volume to fill her tunneling and this seems to be working well. She will follow-up in 1 week. Electronic Signature(s) Signed: 05/21/2022 3:24:56 PM By: CFredirick MaudlinMD FACS Entered By: CFredirick Maudlin on 05/21/2022 15:24:56 -------------------------------------------------------------------------------- HxROS Details Patient Name: Date of Service: HAlden Server Josi B. 05/21/2022 2:00 PM Medical Record Number: [id number] Account Number: [id number] of Birth/Sex: Treating RN: 711/19/1924(88y.o. FElam DutchPrimary Care Provider: RJanace LittenOther Clinician: Referring Provider: Treating Provider/Extender: CNicholaus Corollain Treatment: 21 Ear/Nose/Mouth/Throat Medical History: Negative for: Chronic sinus problems/congestion; Middle ear problems Past Medical History Notes: PARTIAL HEARING LEFT HEAR W/ HEARING AID TOTAL DEAFNESS RIGHT EAR Cardiovascular Medical History: Positive for: Hypertension Gastrointestinal Medical History: Past Medical History Notes: Melena Endocrine Medical History: Positive for: Type II Diabetes Past Medical History Notes: Hypothyroidism Hyperlipidemia associated with type 2 diabetes mellitus ([place] Treated with: Diet Immunological Medical History: Negative for: Lupus Erythematosus; Raynauds; Scleroderma Musculoskeletal Medical History: Past Medical History Notes: [place] T12 [place] Closed comminuted intertrochanteric fracture of proximal end of right femur ([facility] Oncologic Medical History: Negative for: Received Chemotherapy; Received Radiation Immunizations Pneumococcal Vaccine: Received Pneumococcal Vaccination: Yes Received Pneumococcal Vaccination On or After 60th Birthday: No Implantable Devices None Hospitalization / Surgery History Type of Hospitalization/Surgery t/11 t/12 FRACTURE/SURGERY FEMUR FX SURGERY TOTAL HYSTERECTOMY  CHOLECYSTECTOMY TONSILLECTOMY Family and Social History Unknown History: Yes; Never smoker; Marital Status - Married; Alcohol Use: Never; Drug Use: No History; Caffeine Use: Rarely; Financial Concerns: No; Food, Clothing or Shelter Needs:  No; Support System Lacking: No; Transportation Concerns: No Engineer, maintenance) Signed: 05/21/2022 3:30:27 PM By: Fredirick Maudlin MD FACS Signed: 05/21/2022 4:52:29 PM By: Baruch Gouty RN, BSN Entered By: Fredirick Maudlin on 05/21/2022 15:23:48 -------------------------------------------------------------------------------- SuperBill Details Patient Name: Date of Service: [name] 05/21/2022 Medical Record Number: [id number] Patient Account Number: [id number] Date of Birth/Sex: Treating RN: 03/15/1923 (88 y.o. Elam Dutch Primary Care Provider: [name] Other Clinician: Referring Provider: Treating Provider/Extender: Nicholaus Corolla in Treatment: 21 Diagnosis Coding ICD-10 Codes Code Description L89.104 Pressure ulcer of unspecified part of back, stage 4 E11.622 Type 2 diabetes mellitus with other skin ulcer S22.089S Unspecified fracture of T11-T12 vertebra, sequela Facility Procedures CPT4 Code: [id number] Description: 79024 - WOUND VAC-50 SQ CM OR LESS Modifier: Quantity: 1 Physician Procedures : CPT4 Code Description Modifier [id number] 99214 - WC PHYS LEVEL 4 - EST PT ICD-10 Diagnosis Description L89.104 Pressure ulcer of unspecified part of back, stage 4 E11.622 Type 2 diabetes mellitus with other skin ulcer S22.089S Unspecified fracture of  T11-T12 vertebra, sequela Quantity: 1 Electronic Signature(s) Signed: 05/21/2022 3:25:12 PM By: Fredirick Maudlin MD FACS Entered By: Fredirick Maudlin on 05/21/2022 15:25:11

## 2022-05-28 ENCOUNTER — Encounter (HOSPITAL_BASED_OUTPATIENT_CLINIC_OR_DEPARTMENT_OTHER): Payer: Medicare Other | Admitting: General Surgery

## 2022-05-28 DIAGNOSIS — E11622 Type 2 diabetes mellitus with other skin ulcer: Secondary | ICD-10-CM | POA: Diagnosis not present

## 2022-05-28 NOTE — Progress Notes (Signed)
KALESE, ENSZ ([id number]) Visit Report for 05/28/2022 Arrival Information Details Patient Name: Date of Service: [name], [name] 05/28/2022 1:15 PM Medical Record Number: [id number] Patient Account Number: [id number] Date of Birth/Sex: Treating RN: 11/22/1922 (88 y.o. [name] Primary Care Krista Godsil: [name] Other Clinician: Referring Aylani Spurlock: Treating Emmanuelle Coxe/Extender: Nicholaus Corolla in Treatment: 79 Visit Information History Since Last Visit All ordered tests and consults were completed: Yes Patient Arrived: Wheel Chair Added or deleted any medications: No Arrival Time: 13:35 Any new allergies or adverse reactions: No Accompanied By: daughter Had a fall or experienced change in No Transfer Assistance: EasyPivot Patient Lift activities of daily living that may affect Patient Identification Verified: Yes risk of falls: Secondary Verification Process Completed: Yes Signs or symptoms of abuse/neglect since last visito No Patient Requires Transmission-Based Precautions: No Hospitalized since last visit: No Patient Has Alerts: No Implantable device outside of the clinic excluding No cellular tissue based products placed in the center since last visit: Has Dressing in Place as Prescribed: Yes Pain Present Now: Yes Electronic Signature(s) Signed: 05/28/2022 5:22:00 PM By: Blanche East RN Entered By: Blanche East on 05/28/2022 13:36:19 -------------------------------------------------------------------------------- Encounter Discharge Information Details Patient Name: Date of Service: [name] 05/28/2022 1:15 PM Medical Record Number: [id number] Patient Account Number: [id number] Date of Birth/Sex: Treating RN: 1922/10/26 (88 y.o. [name] Primary Care Ruble Pumphrey: [name] Other Clinician: Referring Quinci Gavidia: Treating Vedanshi Massaro/Extender: Nicholaus Corolla in Treatment: 22 Encounter Discharge  Information Items Discharge Condition: Stable Ambulatory Status: Wheelchair Discharge Destination: Home Transportation: Private Auto Accompanied By: daughter Schedule Follow-up Appointment: Yes Clinical Summary of Care: Electronic Signature(s) Signed: 05/28/2022 5:22:00 PM By: Blanche East RN Entered By: Blanche East on 05/28/2022 13:53:27 -------------------------------------------------------------------------------- Lower Extremity Assessment Details Patient Name: Date of Service: [name], [name] 05/28/2022 1:15 PM Medical Record Number: [id number] Patient Account Number: [id number] Date of Birth/Sex: Treating RN: Jul 09, 1923 (88 y.o. [name] Primary Care Jasir Rother: [name] Other Clinician: Referring Jhanvi Drakeford: Treating Annalicia Renfrew/Extender: Nicholaus Corolla in Treatment: 22 Electronic Signature(s) Signed: 05/28/2022 5:22:00 PM By: Blanche East RN Entered By: Blanche East on 05/28/2022 13:37:03 -------------------------------------------------------------------------------- Multi Wound Chart Details Patient Name: Date of Service: [name] 05/28/2022 1:15 PM Medical Record Number: [id number] Patient Account Number: [id number] Date of Birth/Sex: Treating RN: 1923-06-04 (88 y.o. [name] Primary Care Zanovia Rotz: [name] Other Clinician: Referring Taraneh Metheney: Treating Deloss Amico/Extender: Nicholaus Corolla in Treatment: 22 Vital Signs Height(in): 62 Pulse(bpm): 92 Weight(lbs): 110 Blood Pressure(mmHg): 109/61 Body Mass Index(BMI): 20.1 Temperature(F): 97.8 Respiratory Rate(breaths/min): 18 Photos: [N/A:N/A] Thoracic spine N/A N/A Wound Location: Gradually Appeared N/A N/A Wounding Event: Pressure Ulcer N/A N/A Primary Etiology: Hypertension, Type II Diabetes N/A N/A Comorbid History: 12/19/2020 N/A N/A Date Acquired: 22 N/A N/A Weeks of Treatment: Open N/A N/A Wound Status: No N/A  N/A Wound Recurrence: 1x1x0.6 N/A N/A Measurements L x W x D (cm) 0.785 N/A N/A A (cm) : rea 0.471 N/A N/A Volume (cm) : 77.30% N/A N/A % Reduction in A rea: 90.90% N/A N/A % Reduction in Volume: 11 Starting Position 1 (o'clock): 5 Ending Position 1 (o'clock): 4.2 Maximum Distance 1 (cm): Yes N/A N/A Undermining: Category/Stage IV N/A N/A Classification: Medium N/A N/A Exudate A mount: Serosanguineous N/A N/A Exudate Type: red, brown N/A N/A Exudate Color: Well defined, not attached N/A N/A Wound Margin: Large (67-100%) N/A N/A Granulation A mount: Red, Pink N/A N/A Granulation Quality: Small (1-33%) N/A  N/A Necrotic A mount: Fat Layer (Subcutaneous Tissue): Yes N/A N/A Exposed Structures: Fascia: No Tendon: No Muscle: No Joint: No Bone: No Small (1-33%) N/A N/A Epithelialization: Negative Pressure Wound Therapy N/A N/A Procedures Performed: Maintenance (NPWT) Treatment Notes Electronic Signature(s) Signed: 05/28/2022 1:52:47 PM By: Fredirick Maudlin MD FACS Signed: 05/28/2022 5:22:00 PM By: Blanche East RN Entered By: Fredirick Maudlin on 05/28/2022 13:52:47 -------------------------------------------------------------------------------- Multi-Disciplinary Care Plan Details Patient Name: Date of Service: [name] 05/28/2022 1:15 PM Medical Record Number: [id number] Patient Account Number: [id number] Date of Birth/Sex: Treating RN: [birth year] (88 y.o. [name] Primary Care Jaeveon Ashland: [name] Other Clinician: Referring Madicyn Mesina: Treating Eathan Groman/Extender: Nicholaus Corolla in Treatment: 60 Multidisciplinary Care Plan reviewed with physician Active Inactive Pressure Nursing Diagnoses: Knowledge deficit related to causes and risk factors for pressure ulcer development Knowledge deficit related to management of pressures ulcers Potential for impaired tissue integrity related to pressure, friction,  moisture, and shear Goals: Patient will remain free of pressure ulcers Date Initiated: 12/25/2021 Date Inactivated: 04/11/2022 Target Resolution Date: 04/06/2022 Goal Status: Met Patient/caregiver will verbalize understanding of pressure ulcer management Date Initiated: 12/25/2021 Target Resolution Date: 06/07/2022 Goal Status: Active Interventions: Assess: immobility, friction, shearing, incontinence upon admission and as needed Assess offloading mechanisms upon admission and as needed Notes: Wound/Skin Impairment Nursing Diagnoses: Impaired tissue integrity Knowledge deficit related to ulceration/compromised skin integrity Goals: Patient will have a decrease in wound volume by X% from date: (specify in notes) Date Initiated: 12/20/2021 Date Inactivated: 01/08/2022 Target Resolution Date: 01/12/2022 Goal Status: Unmet Unmet Reason: pressure relief Patient/caregiver will verbalize understanding of skin care regimen Date Initiated: 12/20/2021 Target Resolution Date: 06/07/2022 Goal Status: Active Ulcer/skin breakdown will have a volume reduction of 30% by week 4 Date Initiated: 12/20/2021 Date Inactivated: 04/11/2022 Target Resolution Date: 04/06/2022 Goal Status: Unmet Unmet Reason: infection, osteo Ulcer/skin breakdown will have a volume reduction of 50% by week 8 Date Initiated: 12/20/2021 Date Inactivated: 04/11/2022 Target Resolution Date: 04/06/2022 Unmet Reason: osteo, severe Goal Status: Unmet kyphosis Interventions: Assess patient/caregiver ability to obtain necessary supplies Assess patient/caregiver ability to perform ulcer/skin care regimen upon admission and as needed Assess ulceration(s) every visit Notes: Electronic Signature(s) Signed: 05/28/2022 5:22:00 PM By: Blanche East RN Entered By: Blanche East on 05/28/2022 13:46:19 -------------------------------------------------------------------------------- Negative Pressure Wound Therapy Maintenance (NPWT) Details Patient  Name: Date of Service: [name], [name] 05/28/2022 1:15 PM Medical Record Number: [id number] Patient Account Number: [id number] Date of Birth/Sex: Treating RN: Jan 11, 1923 (88 y.o. [name] Primary Care Aum Caggiano: [name] Other Clinician: Referring Zorah Backes: Treating Sahib Pella/Extender: Nicholaus Corolla in Treatment: 22 NPWT Maintenance Performed for: Wound #1 Thoracic spine Performed By: Blanche East, RN Type: Other Coverage Size (sq cm): 1 Pressure Type: Constant Pressure Setting: 125 mmHG Drain Type: None Primary Contact: Non-Adherent Sponge/Dressing Type: Foam, Black Date Initiated: 02/19/2022 Dressing Removed: No Quantity of Sponges/Gauze Removed: 1 Canister Changed: Yes Dressing Reapplied: No Quantity of Sponges/Gauze Inserted: 1 Respones T Treatment: o tolerated well Days On NPWT : 99 Post Procedure Diagnosis Same as Pre-procedure Electronic Signature(s) Signed: 05/28/2022 5:22:00 PM By: Blanche East RN Entered By: Blanche East on 05/28/2022 13:52:26 -------------------------------------------------------------------------------- Pain Assessment Details Patient Name: Date of Service: [name], [name] 05/28/2022 1:15 PM Medical Record Number: [id number] Patient Account Number: [id number] Date of Birth/Sex: Treating RN: 25-Jun-1923 (88 y.o. [name] Primary Care Roshan Salamon: [name] Other Clinician: Referring Yesmin Mutch: Treating Dyshaun Bonzo/Extender: Nicholaus Corolla in Treatment: 22 Active Problems Location  of Pain Severity and Description of Pain Patient Has Paino Yes Site Locations Rate the pain. Current Pain Level: 5 Pain Management and Medication Current Pain Management: Electronic Signature(s) Signed: 05/28/2022 5:22:00 PM By: Blanche East RN Entered By: Blanche East on 05/28/2022  13:36:58 -------------------------------------------------------------------------------- Patient/Caregiver Education Details Patient Name: Date of Service: [name] 8/21/2023andnbsp1:15 PM Medical Record Number: [id number] Patient Account Number: [id number] Date of Birth/Gender: Treating RN: December 27, 1922 (88 y.o. [name] Primary Care Physician: [name] Other Clinician: Referring Physician: Treating Physician/Extender: Nicholaus Corolla in Treatment: 22 Education Assessment Education Provided To: Patient Education Topics Provided Wound/Skin Impairment: Methods: Explain/Verbal Responses: Reinforcements needed, State content correctly [employer]) Signed: 05/28/2022 5:22:00 PM By: Blanche East RN Entered By: Blanche East on 05/28/2022 13:46:37 -------------------------------------------------------------------------------- Wound Assessment Details Patient Name: Date of Service: [name], [name] 05/28/2022 1:15 PM Medical Record Number: [id number] Patient Account Number: [id number] Date of Birth/Sex: Treating RN: Jan 06, 1923 (88 y.o. Iver Nestle, Lily Primary Care Tillman Kazmierski: [name] Other Clinician: Referring Deontra Pereyra: Treating Atarah Cadogan/Extender: Nicholaus Corolla in Treatment: 22 Wound Status Wound Number: 1 Primary Etiology: Pressure Ulcer Wound Location: Thoracic spine Wound Status: Open Wounding Event: Gradually Appeared Comorbid History: Hypertension, Type II Diabetes Date Acquired: 12/19/2020 Weeks Of Treatment: 22 Clustered Wound: No Photos Wound Measurements Length: (cm) 1 Width: (cm) 1 Depth: (cm) 0.6 Area: (cm) 0.785 Volume: (cm) 0.471 % Reduction in Area: 77.3% % Reduction in Volume: 90.9% Epithelialization: Small (1-33%) Tunneling: No Undermining: Yes Starting Position (o'clock): 11 Ending Position (o'clock): 5 Maximum Distance: (cm) 4.2 Wound  Description Classification: Category/Stage IV Wound Margin: Well defined, not attached Exudate Amount: Medium Exudate Type: Serosanguineous Exudate Color: red, brown Foul Odor After Cleansing: No Slough/Fibrino Yes Wound Bed Granulation Amount: Large (67-100%) Exposed Structure Granulation Quality: Red, Pink Fascia Exposed: No Necrotic Amount: Small (1-33%) Fat Layer (Subcutaneous Tissue) Exposed: Yes Necrotic Quality: Adherent Slough Tendon Exposed: No Muscle Exposed: No Joint Exposed: No Bone Exposed: No Treatment Notes Wound #1 (Thoracic spine) Cleanser Wound Cleanser Discharge Instruction: Cleanse the wound with wound cleanser prior to applying a clean dressing using gauze sponges, not tissue or cotton balls. Peri-Wound Care Skin Prep Discharge Instruction: Use skin prep as directed Topical Primary Dressing Promogran Prisma Matrix, 4.34 (sq in) (silver collagen) Discharge Instruction: Moisten collagen with saline or hydrogel VAC Secondary Dressing Secured With Compression Wrap Compression Stockings Add-Ons Electronic Signature(s) Signed: 05/28/2022 5:22:00 PM By: Blanche East RN Entered By: Blanche East on 05/28/2022 13:45:28 -------------------------------------------------------------------------------- [place] Details Patient Name: Date of Service: Stacy Like B. 05/28/2022 1:15 PM Medical Record Number: [id number] Patient Account Number: [id number] Date of Birth/Sex: Treating RN: 08/31/1923 (88 y.o. Iver Nestle, Jamie Primary Care Neomia Herbel: [name] Other Clinician: Referring Jaylyne Breese: Treating Sharnika Binney/Extender: Nicholaus Corolla in Treatment: 22 Vital Signs Time Taken: 13:36 Temperature (F): 97.8 Height (in): 62 Pulse (bpm): 92 Weight (lbs): 110 Respiratory Rate (breaths/min): 18 Body Mass Index (BMI): 20.1 Blood Pressure (mmHg): 109/61 Reference Range: 80 - 120 mg / dl Electronic Signature(s) Signed: 05/28/2022  5:22:00 PM By: Blanche East RN Entered By: Blanche East on 05/28/2022 13:36:50

## 2022-05-28 NOTE — Progress Notes (Signed)
[name], [name] ([id number]) Visit Report for 05/28/2022 Chief Complaint Document Details Patient Name: Date of Service: [name], [name] 05/28/2022 1:15 PM Medical Record Number: [id number] Patient Account Number: [id number] Date of Birth/Sex: Treating RN: [birth year] (88 y.o. F) Primary Care Provider: [name] Other Clinician: Referring Provider: Treating Provider/Extender: Nicholaus Corolla in Treatment: 22 Information Obtained from: Patient Chief Complaint Patient is at the clinic for treatment of an open pressure ulcer Electronic Signature(s) Signed: 05/28/2022 1:52:53 PM By: Fredirick Maudlin MD FACS Entered By: Fredirick Maudlin on 05/28/2022 13:52:53 -------------------------------------------------------------------------------- HPI Details Patient Name: Date of Service: [name] 05/28/2022 1:15 PM Medical Record Number: [id number] Patient Account Number: [id number] Date of Birth/Sex: Treating RN: 1922-12-02 (88 y.o. F) Primary Care Provider: [name] Other Clinician: Referring Provider: Treating Provider/Extender: Nicholaus Corolla in Treatment: 22 History of Present Illness HPI Description: ADMISSION 12/19/21 This is a 88 year old woman who is presenting to clinic today with a an open ulcer over her thoracic spine. She has severe kyphosis and a history of prior T12 fracture, as well as type 2 diabetes mellitus. The wound has been present for about a year. She is accompanied by her daughters, 1 of whom is a Marine scientist. They have been trying to offload the site by using pillows and supports to keep returned while in bed, and an eggcrate foam with a cut out for the wound for when she is sitting up. They have been applying Santyl to the site. She does have home health and the provider took a culture which was positive for Pseudomonas. She was on ciprofloxacin for this. A recent repeat culture was negative. Most recent  hemoglobin A1c was 6.5, in February. A C-reactive protein also obtained in February was elevated at 24.1. No imaging has been performed of the site. The patient denies significant pain. 12/25/2021: Last week, I took a bone biopsy as well as culture. Fortunately, the bone biopsy was negative for osteomyelitis. The culture returned with rare corynebacterium and rare Enterococcus faecalis. We have been using Santyl and Hydrofera Blue. T oday, the wound appears cleaner and there are buds of granulation tissue forming around the perimeter. 01/01/2022: Due to the positive culture, we switched to topical gentamicin. Today, she has had a little bit of a slough accumulation. The wound continues to have buds of granulation tissue forming. Apparently, the Hydrofera Blue was placed just over the wound opening rather than down in to contact the surface. No purulent drainage or odor. 01/08/2022: Last week, there was more slough in the wound bed, so I changed back to Santyl. There is now hypertrophic granulation tissue around the wound opening. Once again, we seem to be having some difficulty getting the Hydrofera Blue tucked up into the wound; there is undermining that extends for 3 to 4 cm at the 12 o'clock position and the Hydrofera Blue is simply sitting on top of the wound without being in contact with the surface. Bone is still palpable, but tissue is beginning to close in over it. 01/22/2022: The wound looks much cleaner this week and the hypertrophic granulation tissue responded appropriately to silver nitrate application. She continues to have undermining for about 4 cm from 12:00 to 4:00. I can still feel the bone in the base at about the 2 o'clock position, but it is less prominent and tissue is closing over. We have been using Santyl and Hydrofera Blue. The daughter that accompanies the patient today indicates that they have been very  diligent in making sure the [facility] is tucked under and into the  full base of the wound. 02/05/2022: The wound overall is improved. The undermining is about the same but there is less bone palpable. We have been using Santyl and Hydrofera Blue. Minimal slough accumulation in the 2 weeks since her last visit. 02/19/2022: The visible wound surface is very clean without any slough accumulation. Although I can feel bone in the undermined portion of the wound, it is now covered with a layer of tissue. The undermined area is about the same size, however. We have been using Santyl with Hydrofera Blue. She has been approved for snap VAC use. 02/26/2022: Last week, we applied a snap VAC. The wound dimensions have come in by a bit in all directions. The VAC canister did fill, however, and was starting to leak so the patient's daughter removed the VAC and dressed the wound as we had been previously. There is a little bit of thin slough on the wound surface with some senescent tissue at the inferior margin. Bone is still palpable but covered with a layer of tissue. No odor. 03/06/2022: We continue to have technical difficulties with the snap VAC which resulted in the patient's family removing it and going back to the dressing changes with Hydrofera Blue. It sounds like there are issues maintaining suction and the drape is getting bunched up creating a leak. The wound measured larger today, but the intake nurse thinks that perhaps her measurements were taken slightly differently than last week's. There is just some thin slough on the wound surface as well as some hypertrophic granulation tissue on the lateral border. 03/13/2022: The snap VAC worked much better this week and only began leaking yesterday. At that point, the patient's family removed it and applied Hydrofera Blue to her wound. Today, the tissue coverage over the bone that had been exposed is more substantial. She still has undermining present. There is also some senescent skin around the wound orifice along with some  hypertrophic granulation tissue. 03/20/2022: The snap VAC unfortunately failed on Friday. There has really been no significant change to the wound for several weeks. The degree of undermining is unchanged. The surface is clean. 03/27/2022: Once again, the snap VAC failed prematurely and had to be removed on Saturday. The undermining remains the same. There is more tissue coverage of the previously exposed bone. The wound surface has just a light layer of biofilm and thin slough. She was approved for a standard wound VAC, but there are currently no home health agencies able to staff dressing changes for her; it is too difficult for her and her family to bring her to the wound center more than once a week. 04/03/2022: The snap VAC stayed on but the canister filled up on Sunday. The direct depth of the wound is a little bit shallower but the undermining has not really changed. There is some slightly discolored, darker tissue at the 9 o'clock position. No odor or purulent drainage. 04/11/2022: Using 2 canisters worked well and the snap VAC lasted until yesterday. She has her standard wound VAC with her today. The orifice of the wound has contracted somewhat. There is still substantial undermining at 12:00, but the undermined portion to the right between 2 and 4:00 has closed and somewhat. The previously exposed bone is no longer even palpable under the overlying tissue. No concern for infection. 04/17/2022: A standard VAC dressing was initiated last week. The family has been changing it at home,  due to the inability to secure home health services. It is a little bit challenging to apply, due to the small orifice of the wound with substantial undermining. Today, the undermining at 12:00 seems like it might be a little bit deeper, but the 2-4 o'clock undermining is decreased. There is excellent tissue coverage over the previously exposed bone. There is a little bit of slough accumulation at the orifice, along  with some senescent skin. 04/23/2022: The wound VAC was not changed all week due to the family being overwhelmed with other issues. Fortunately, there does not seem to have been any untoward effects from this. The orifice of the wound remains unchanged but the undermining and tunneling has narrowed to just 1 area at 12:00. No purulent drainage or odor. Good tissue coverage of the previously exposed bone. 04/30/2022: The tunneling has narrowed substantially. There is no purulent drainage or odor from the wound. The orifice of the wound is about the same size and there is some hypertrophic granulation tissue at the opening. 05/07/2022: The undermining has come in substantially. The only particularly deep area is from 1-2 o'clock. The rest of the wound is clean without any purulent drainage or odor. 05/14/2022: The orifice of the wound has contracted further. She continues to have just under 5 cm of tunneling at 12:00, but the wound is clean without any purulent drainage or odor. 05/21/2022: The tunnel has come in by about half a centimeter. The wound is clean without any slough. 05/28/2022: The tunnel continues to contract. The wound is clean with good granulation tissue at the base. No concern for infection. Electronic Signature(s) Signed: 05/28/2022 1:53:26 PM By: Fredirick Maudlin MD FACS Entered By: Fredirick Maudlin on 05/28/2022 13:53:26 -------------------------------------------------------------------------------- Physical Exam Details Patient Name: Date of Service: [name], [name] 05/28/2022 1:15 PM Medical Record Number: [id number] Patient Account Number: [id number] Date of Birth/Sex: Treating RN: [birth year] (88 y.o. F) Primary Care Provider: [name] Other Clinician: Referring Provider: Treating Provider/Extender: Nicholaus Corolla in Treatment: 22 Constitutional . . . . No acute distress.Marland Kitchen Respiratory Normal work of breathing on room air.. Notes 05/28/2022:  The tunnel continues to contract. The wound is clean with good granulation tissue at the base. No concern for infection. Electronic Signature(s) Signed: 05/28/2022 1:54:01 PM By: Fredirick Maudlin MD FACS Entered By: Fredirick Maudlin on 05/28/2022 13:54:01 -------------------------------------------------------------------------------- Physician Orders Details Patient Name: Date of Service: [name], [name] 05/28/2022 1:15 PM Medical Record Number: [id number] Patient Account Number: [id number] Date of Birth/Sex: Treating RN: September 09, 1923 (88 y.o. Marta Lamas Primary Care Provider: [name] Other Clinician: Referring Provider: Treating Provider/Extender: Nicholaus Corolla in Treatment: 42 Verbal / Phone Orders: No Diagnosis Coding ICD-10 Coding Code Description L89.104 Pressure ulcer of unspecified part of back, stage 4 E11.622 Type 2 diabetes mellitus with other skin ulcer S22.089S Unspecified fracture of T11-T12 vertebra, sequela Follow-up Appointments ppointment in 2 weeks. - Dr Celine Ahr - Room 1 - with Vaughan Basta Return A [facility] 8/28 '@1'$ :15 PM Anesthetic Wound #1 Thoracic spine (In clinic) Topical Lidocaine 5% applied to wound bed Bathing/ Shower/ Hygiene May shower with protection but do not get wound dressing(s) wet. Negative Presssure Wound Therapy Wound Vac to wound continuously at 183m/hg pressure Black Foam Off-Loading Turn and reposition every 2 hours Other: - KEEP PRESSURE OFF OF BACK MAY USE EGG CRATE CUSHION Wound Treatment Wound #1 - Thoracic spine Cleanser: Wound Cleanser (Generic) 2 x Per Week/30 Days Discharge Instructions: Cleanse the wound with wound cleanser  prior to applying a clean dressing using gauze sponges, not tissue or cotton balls. Peri-Wound Care: Skin Prep 2 x Per Week/30 Days Discharge Instructions: Use skin prep as directed Prim Dressing: Promogran Prisma Matrix, 4.34 (sq in) (silver collagen) 2 x Per Week/30  Days ary Discharge Instructions: Moisten collagen with saline or hydrogel Prim Dressing: VAC ary 2 x Per Week/30 Days Electronic Signature(s) Signed: 05/28/2022 2:17:33 PM By: Fredirick Maudlin MD FACS Entered By: Fredirick Maudlin on 05/28/2022 13:54:10 -------------------------------------------------------------------------------- Problem List Details Patient Name: Date of Service: [name] 05/28/2022 1:15 PM Medical Record Number: [id number] Patient Account Number: [id number] Date of Birth/Sex: Treating RN: 10-Apr-1923 (88 y.o. Marta Lamas Primary Care Provider: [name] Other Clinician: Referring Provider: Treating Provider/Extender: Nicholaus Corolla in Treatment: 22 Active Problems ICD-10 Encounter Code Description Active Date MDM Diagnosis L89.104 Pressure ulcer of unspecified part of back, stage 4 12/19/2021 No Yes E11.622 Type 2 diabetes mellitus with other skin ulcer 12/19/2021 No Yes S22.089S Unspecified fracture of T11-T12 vertebra, sequela 12/19/2021 No Yes Inactive Problems Resolved Problems Electronic Signature(s) Signed: 05/28/2022 1:52:40 PM By: Fredirick Maudlin MD FACS Entered By: Fredirick Maudlin on 05/28/2022 13:52:40 -------------------------------------------------------------------------------- Progress Note Details Patient Name: Date of Service: [name] 05/28/2022 1:15 PM Medical Record Number: [id number] Patient Account Number: [id number] Date of Birth/Sex: Treating RN: [birth year] (88 y.o. F) Primary Care Provider: [name] Other Clinician: Referring Provider: Treating Provider/Extender: Nicholaus Corolla in Treatment: 22 Subjective Chief Complaint Information obtained from Patient Patient is at the clinic for treatment of an open pressure ulcer History of Present Illness (HPI) ADMISSION 12/19/21 This is a 88 year old woman who is presenting to clinic today with a an  open ulcer over her thoracic spine. She has severe kyphosis and a history of prior T12 fracture, as well as type 2 diabetes mellitus. The wound has been present for about a year. She is accompanied by her daughters, 1 of whom is a Marine scientist. They have been trying to offload the site by using pillows and supports to keep returned while in bed, and an eggcrate foam with a cut out for the wound for when she is sitting up. They have been applying Santyl to the site. She does have home health and the provider took a culture which was positive for Pseudomonas. She was on ciprofloxacin for this. A recent repeat culture was negative. Most recent hemoglobin A1c was 6.5, in February. A C-reactive protein also obtained in February was elevated at 24.1. No imaging has been performed of the site. The patient denies significant pain. 12/25/2021: Last week, I took a bone biopsy as well as culture. Fortunately, the bone biopsy was negative for osteomyelitis. The culture returned with rare corynebacterium and rare Enterococcus faecalis. We have been using Santyl and Hydrofera Blue. T oday, the wound appears cleaner and there are buds of granulation tissue forming around the perimeter. 01/01/2022: Due to the positive culture, we switched to topical gentamicin. Today, she has had a little bit of a slough accumulation. The wound continues to have buds of granulation tissue forming. Apparently, the Hydrofera Blue was placed just over the wound opening rather than down in to contact the surface. No purulent drainage or odor. 01/08/2022: Last week, there was more slough in the wound bed, so I changed back to Santyl. There is now hypertrophic granulation tissue around the wound opening. Once again, we seem to be having some difficulty getting the Hydrofera Blue tucked up into the  wound; there is undermining that extends for 3 to 4 cm at the 12 o'clock position and the [facility] is simply sitting on top of the wound without being  in contact with the surface. Bone is still palpable, but tissue is beginning to close in over it. 01/22/2022: The wound looks much cleaner this week and the hypertrophic granulation tissue responded appropriately to silver nitrate application. She continues to have undermining for about 4 cm from 12:00 to 4:00. I can still feel the bone in the base at about the 2 o'clock position, but it is less prominent and tissue is closing over. We have been using Santyl and Hydrofera Blue. The daughter that accompanies the patient today indicates that they have been very diligent in making sure the [facility] is tucked under and into the full base of the wound. 02/05/2022: The wound overall is improved. The undermining is about the same but there is less bone palpable. We have been using Santyl and Hydrofera Blue. Minimal slough accumulation in the 2 weeks since her last visit. 02/19/2022: The visible wound surface is very clean without any slough accumulation. Although I can feel bone in the undermined portion of the wound, it is now covered with a layer of tissue. The undermined area is about the same size, however. We have been using Santyl with Hydrofera Blue. She has been approved for snap VAC use. 02/26/2022: Last week, we applied a snap VAC. The wound dimensions have come in by a bit in all directions. The VAC canister did fill, however, and was starting to leak so the patient's daughter removed the VAC and dressed the wound as we had been previously. There is a little bit of thin slough on the wound surface with some senescent tissue at the inferior margin. Bone is still palpable but covered with a layer of tissue. No odor. 03/06/2022: We continue to have technical difficulties with the snap VAC which resulted in the patient's family removing it and going back to the dressing changes with Hydrofera Blue. It sounds like there are issues maintaining suction and the drape is getting bunched up creating a leak.  The wound measured larger today, but the intake nurse thinks that perhaps her measurements were taken slightly differently than last week's. There is just some thin slough on the wound surface as well as some hypertrophic granulation tissue on the lateral border. 03/13/2022: The snap VAC worked much better this week and only began leaking yesterday. At that point, the patient's family removed it and applied Hydrofera Blue to her wound. Today, the tissue coverage over the bone that had been exposed is more substantial. She still has undermining present. There is also some senescent skin around the wound orifice along with some hypertrophic granulation tissue. 03/20/2022: The snap VAC unfortunately failed on Friday. There has really been no significant change to the wound for several weeks. The degree of undermining is unchanged. The surface is clean. 03/27/2022: Once again, the snap VAC failed prematurely and had to be removed on Saturday. The undermining remains the same. There is more tissue coverage of the previously exposed bone. The wound surface has just a light layer of biofilm and thin slough. She was approved for a standard wound VAC, but there are currently no home health agencies able to staff dressing changes for her; it is too difficult for her and her family to bring her to the wound center more than once a week. 04/03/2022: The snap VAC stayed on but the  canister filled up on Sunday. The direct depth of the wound is a little bit shallower but the undermining has not really changed. There is some slightly discolored, darker tissue at the 9 o'clock position. No odor or purulent drainage. 04/11/2022: Using 2 canisters worked well and the snap VAC lasted until yesterday. She has her standard wound VAC with her today. The orifice of the wound has contracted somewhat. There is still substantial undermining at 12:00, but the undermined portion to the right between 2 and 4:00 has closed and  somewhat. The previously exposed bone is no longer even palpable under the overlying tissue. No concern for infection. 04/17/2022: A standard VAC dressing was initiated last week. The family has been changing it at home, due to the inability to secure home health services. It is a little bit challenging to apply, due to the small orifice of the wound with substantial undermining. Today, the undermining at 12:00 seems like it might be a little bit deeper, but the 2-4 o'clock undermining is decreased. There is excellent tissue coverage over the previously exposed bone. There is a little bit of slough accumulation at the orifice, along with some senescent skin. 04/23/2022: The wound VAC was not changed all week due to the family being overwhelmed with other issues. Fortunately, there does not seem to have been any untoward effects from this. The orifice of the wound remains unchanged but the undermining and tunneling has narrowed to just 1 area at 12:00. No purulent drainage or odor. Good tissue coverage of the previously exposed bone. 04/30/2022: The tunneling has narrowed substantially. There is no purulent drainage or odor from the wound. The orifice of the wound is about the same size and there is some hypertrophic granulation tissue at the opening. 05/07/2022: The undermining has come in substantially. The only particularly deep area is from 1-2 o'clock. The rest of the wound is clean without any purulent drainage or odor. 05/14/2022: The orifice of the wound has contracted further. She continues to have just under 5 cm of tunneling at 12:00, but the wound is clean without any purulent drainage or odor. 05/21/2022: The tunnel has come in by about half a centimeter. The wound is clean without any slough. 05/28/2022: The tunnel continues to contract. The wound is clean with good granulation tissue at the base. No concern for infection. Patient History Family History Unknown History. Social History Never  smoker, Marital Status - Married, Alcohol Use - Never, Drug Use - No History, Caffeine Use - Rarely. Medical History Ear/Nose/Mouth/Throat Denies history of Chronic sinus problems/congestion, Middle ear problems Cardiovascular Patient has history of Hypertension Endocrine Patient has history of Type II Diabetes Immunological Denies history of Lupus Erythematosus, Raynaudoos, Scleroderma Oncologic Denies history of Received Chemotherapy, Received Radiation Hospitalization/Surgery History - t/11 t/12 FRACTURE/SURGERY. - FEMUR FX SURGERY. - TOTAL HYSTERECTOMY. - CHOLECYSTECTOMY. - TONSILLECTOMY. Medical A Surgical History Notes nd Ear/Nose/Mouth/Throat PARTIAL HEARING LEFT HEAR W/ HEARING AID TOTAL DEAFNESS RIGHT EAR Gastrointestinal Melena Endocrine Hypothyroidism Hyperlipidemia associated with type 2 diabetes mellitus (HCC) Musculoskeletal Closed T12 fracture (HCC) Hip fracture (HCC) Closed comminuted intertrochanteric fracture of proximal end of right femur (HCC) Objective Constitutional No acute distress.. Vitals Time Taken: 1:36 PM, Height: 62 in, Weight: 110 lbs, BMI: 20.1, Temperature: 97.8 F, Pulse: 92 bpm, Respiratory Rate: 18 breaths/min, Blood Pressure: 109/61 mmHg. Respiratory Normal work of breathing on room air.. General Notes: 05/28/2022: The tunnel continues to contract. The wound is clean with good granulation tissue at the base. No concern for  infection. Integumentary (Hair, Skin) Wound #1 status is Open. Original cause of wound was Gradually Appeared. The date acquired was: 12/19/2020. The wound has been in treatment 22 weeks. The wound is located on the Thoracic spine. The wound measures 1cm length x 1cm width x 0.6cm depth; 0.785cm^2 area and 0.471cm^3 volume. There is Fat Layer (Subcutaneous Tissue) exposed. There is no tunneling noted, however, there is undermining starting at 11:00 and ending at 5:00 with a maximum distance of 4.2cm. There is a medium amount  of serosanguineous drainage noted. The wound margin is well defined and not attached to the wound base. There is large (67-100%) red, pink granulation within the wound bed. There is a small (1-33%) amount of necrotic tissue within the wound bed including Adherent Slough. 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 - Dr Celine Ahr - Room 1 - with Lynden Oxford 8/28 '@1'$ :15 PM Anesthetic: Wound #1 Thoracic spine: (In clinic) Topical Lidocaine 5% applied to wound bed Bathing/ Shower/ Hygiene: May shower with protection but do not get wound dressing(s) wet. Negative Presssure Wound Therapy: Wound Vac to wound continuously at 150m/hg pressure Black Foam Off-Loading: Turn and reposition every 2 hours Other: - KEEP PRESSURE OFF OF BACK MAY USE EGG CRATE CUSHION WOUND #1: - Thoracic spine Wound Laterality: Cleanser: Wound Cleanser (Generic) 2 x Per Week/30 Days Discharge Instructions: Cleanse the wound with wound cleanser prior to applying a clean dressing using gauze sponges, not tissue or cotton balls. Peri-Wound Care: Skin Prep 2 x Per Week/30 Days Discharge Instructions: Use skin prep as directed Prim Dressing: Promogran Prisma Matrix, 4.34 (sq in) (silver collagen) 2 x Per Week/30 Days ary Discharge Instructions: Moisten collagen with saline or hydrogel Prim Dressing: VAC 2 x Per Week/30 Days ary 05/28/2022: The tunnel continues to contract. The wound is clean with good granulation tissue at the base. No concern for infection. No debridement was necessary today. We will continue to use the Prisma silver collagen and wound VAC. Follow-up in 1 week. Electronic Signature(s) Signed: 05/28/2022 1:54:37 PM By: CFredirick MaudlinMD FACS Entered By: CFredirick Maudlinon 05/28/2022  13:54:37 -------------------------------------------------------------------------------- HxROS Details Patient Name: Date of Service: [name], LEMMA8/21/2023 1:15 PM Medical Record Number: [id number] Account Number: [id number] of Birth/Sex: Treating RN: [birth year](88y.o. F) Primary Care Provider: RJanace LittenOther Clinician: Referring Provider: Treating Provider/Extender: CNicholaus Corollain Treatment: 22 Ear/Nose/Mouth/Throat Medical History: Negative for: Chronic sinus problems/congestion; Middle ear problems Past Medical History Notes: PARTIAL HEARING LEFT HEAR W/ HEARING AID TOTAL DEAFNESS RIGHT EAR Cardiovascular Medical History: Positive for: Hypertension Gastrointestinal Medical History: Past Medical History Notes: Melena Endocrine Medical History: Positive for: Type II Diabetes Past Medical History Notes: Hypothyroidism Hyperlipidemia associated with type 2 diabetes mellitus [place] Treated with: Diet Immunological Medical History: Negative for: Lupus Erythematosus; Raynauds; Scleroderma Musculoskeletal Medical History: Past Medical History Notes: Closed T12 fracture [place] Closed comminuted intertrochanteric fracture of proximal end of right femur ([facility] Oncologic Medical History: Negative for: Received Chemotherapy; Received Radiation Immunizations Pneumococcal Vaccine: Received Pneumococcal Vaccination: Yes Received Pneumococcal Vaccination On or After 60th Birthday: No Implantable Devices None Hospitalization / Surgery History Type of Hospitalization/Surgery t/11 t/12 FRACTURE/SURGERY FEMUR FX SURGERY TOTAL HYSTERECTOMY CHOLECYSTECTOMY TONSILLECTOMY Family and Social History Unknown History: Yes; Never smoker; Marital Status - Married; Alcohol Use: Never; Drug  Use: No History; Caffeine Use: Rarely; Financial Concerns: No; Food, Clothing or Shelter Needs: No; Support System Lacking: No;  Transportation Concerns: No Electronic Signature(s) Signed: 05/28/2022 2:17:33 PM By: Fredirick Maudlin MD FACS Entered By: Fredirick Maudlin on 05/28/2022 13:53:32 -------------------------------------------------------------------------------- SuperBill Details Patient Name: Date of Service: [name] 05/28/2022 Medical Record Number: [id number] Patient Account Number: [id number] Date of Birth/Sex: Treating RN: 03/16/1923 (88 y.o. F) Primary Care Provider: [name] Other Clinician: Referring Provider: Treating Provider/Extender: Nicholaus Corolla in Treatment: 22 Diagnosis Coding ICD-10 Codes Code Description L89.104 Pressure ulcer of unspecified part of back, stage 4 E11.622 Type 2 diabetes mellitus with other skin ulcer S22.089S Unspecified fracture of T11-T12 vertebra, sequela Facility Procedures CPT4 Code: [id number] Description: 84166 - WOUND VAC-50 SQ CM OR LESS Modifier: Quantity: 1 Physician Procedures : CPT4 Code Description Modifier [id number] - WC PHYS LEVEL 4 - EST PT ICD-10 Diagnosis Description L89.104 Pressure ulcer of unspecified part of back, stage 4 E11.622 Type 2 diabetes mellitus with other skin ulcer S22.089S Unspecified fracture of  T11-T12 vertebra, sequela Quantity: 1 Electronic Signature(s) Signed: 05/28/2022 1:54:50 PM By: Fredirick Maudlin MD FACS Entered By: Fredirick Maudlin on 05/28/2022 13:54:50

## 2022-06-04 ENCOUNTER — Encounter (HOSPITAL_BASED_OUTPATIENT_CLINIC_OR_DEPARTMENT_OTHER): Payer: Medicare Other | Admitting: General Surgery

## 2022-06-04 DIAGNOSIS — E11622 Type 2 diabetes mellitus with other skin ulcer: Secondary | ICD-10-CM | POA: Diagnosis not present

## 2022-06-05 NOTE — Progress Notes (Signed)
ALAJIAH, DUTKIEWICZ ([id number]) Visit Report for 06/04/2022 Arrival Information Details Patient Name: Date of Service: [name], [name] 06/04/2022 1:15 PM Medical Record Number: [id number] Patient Account Number: [id number] Date of Birth/Sex: Treating RN: 1923-05-16 (88 y.o. Elam Dutch Primary Care Marelly Wehrman: [name] Other Clinician: Referring Yilia Sacca: Treating Meshilem Machuca/Extender: Nicholaus Corolla in Treatment: 23 Visit Information History Since Last Visit Added or deleted any medications: No Patient Arrived: Wheel Chair Any new allergies or adverse reactions: No Arrival Time: 13:26 Had a fall or experienced change in No Accompanied By: daughter activities of daily living that may affect Transfer Assistance: Manual risk of falls: Patient Identification Verified: Yes Signs or symptoms of abuse/neglect since last visito No Secondary Verification Process Completed: Yes Hospitalized since last visit: No Patient Requires Transmission-Based Precautions: No Implantable device outside of the clinic excluding No Patient Has Alerts: No cellular tissue based products placed in the center since last visit: Has Dressing in Place as Prescribed: Yes Pain Present Now: No Electronic Signature(s) Signed: 06/05/2022 6:08:33 PM By: Baruch Gouty RN, BSN Entered By: Baruch Gouty on 06/04/2022 13:38:18 -------------------------------------------------------------------------------- Encounter Discharge Information Details Patient Name: Date of Service: [name], [name] 06/04/2022 1:15 PM Medical Record Number: [id number] Patient Account Number: [id number] Date of Birth/Sex: Treating RN: Jan 20, 1923 (88 y.o. Elam Dutch Primary Care Lorann Tani: [name] Other Clinician: Referring Giordana Weinheimer: Treating Cortlynn Hollinsworth/Extender: Nicholaus Corolla in Treatment: 23 Encounter Discharge Information Items Discharge Condition:  Stable Ambulatory Status: Wheelchair Discharge Destination: Home Transportation: Private Auto Accompanied By: daughter Schedule Follow-up Appointment: Yes Clinical Summary of Care: Patient Declined Electronic Signature(s) Signed: 06/05/2022 6:08:33 PM By: Baruch Gouty RN, BSN Entered By: Baruch Gouty on 06/04/2022 14:10:33 -------------------------------------------------------------------------------- Lower Extremity Assessment Details Patient Name: Date of Service: Stacy [name], [name] 06/04/2022 1:15 PM Medical Record Number: [id number] Patient Account Number: [id number] Date of Birth/Sex: Treating RN: July 10, 1923 (88 y.o. Elam Dutch Primary Care Caraline Deutschman: [name] Other Clinician: Referring Willodene Stallings: Treating Tayten Heber/Extender: Nicholaus Corolla in Treatment: 23 Electronic Signature(s) Signed: 06/05/2022 6:08:33 PM By: Baruch Gouty RN, BSN Entered By: Baruch Gouty on 06/04/2022 13:38:49 -------------------------------------------------------------------------------- Multi Wound Chart Details Patient Name: Date of Service: [name], [name] 06/04/2022 1:15 PM Medical Record Number: [id number] Patient Account Number: [id number] Date of Birth/Sex: Treating RN: Nov 29, 1922 (88 y.o. Elam Dutch Primary Care Shernita Rabinovich: [name] Other Clinician: Referring Irmalee Riemenschneider: Treating Jailyn Langhorst/Extender: Nicholaus Corolla in Treatment: 23 Vital Signs Height(in): 62 Pulse(bpm): 83 Weight(lbs): 110 Blood Pressure(mmHg): 146/75 Body Mass Index(BMI): 20.1 Temperature(F): 98.3 Respiratory Rate(breaths/min): 18 Photos: [N/A:N/A] Thoracic spine N/A N/A Wound Location: Gradually Appeared N/A N/A Wounding Event: Pressure Ulcer N/A N/A Primary Etiology: Hypertension, Type II Diabetes N/A N/A Comorbid History: 12/19/2020 N/A N/A Date Acquired: 10 N/A N/A Weeks of Treatment: Open N/A N/A Wound Status: No N/A  N/A Wound Recurrence: 0.9x0.7x0.8 N/A N/A Measurements L x W x D (cm) 0.495 N/A N/A A (cm) : rea 0.396 N/A N/A Volume (cm) : 85.70% N/A N/A % Reduction in A rea: 92.40% N/A N/A % Reduction in Volume: 12 Starting Position 1 (o'clock): 3 Ending Position 1 (o'clock): 4.3 Maximum Distance 1 (cm): Yes N/A N/A Undermining: Category/Stage IV N/A N/A Classification: Medium N/A N/A Exudate A mount: Serosanguineous N/A N/A Exudate Type: red, brown N/A N/A Exudate Color: Well defined, not attached N/A N/A Wound Margin: Large (67-100%) N/A N/A Granulation Amount: Red, Pink N/A N/A Granulation Quality: None Present (0%) N/A N/A Necrotic Amount: Fat Layer (  Subcutaneous Tissue): Yes N/A N/A Exposed Structures: Fascia: No Tendon: No Muscle: No Joint: No Bone: No Small (1-33%) N/A N/A Epithelialization: Negative Pressure Wound Therapy N/A N/A Procedures Performed: Maintenance (NPWT) Treatment Notes Electronic Signature(s) Signed: 06/04/2022 1:58:10 PM By: Fredirick Maudlin MD FACS Signed: 06/05/2022 6:08:33 PM By: Baruch Gouty RN, BSN Entered By: Fredirick Maudlin on 06/04/2022 13:58:10 -------------------------------------------------------------------------------- Multi-Disciplinary Care Plan Details Patient Name: Date of Service: [name] 06/04/2022 1:15 PM Medical Record Number: [id number] Patient Account Number: [id number] Date of Birth/Sex: Treating RN: 03-29-1923 (88 y.o. Elam Dutch Primary Care Vong Garringer: [name] Other Clinician: Referring Sharlett Lienemann: Treating Davonda Ausley/Extender: Nicholaus Corolla in Treatment: 23 Multidisciplinary Care Plan reviewed with physician Active Inactive Pressure Nursing Diagnoses: Knowledge deficit related to causes and risk factors for pressure ulcer development Knowledge deficit related to management of pressures ulcers Potential for impaired tissue integrity related to pressure,  friction, moisture, and shear Goals: Patient will remain free of pressure ulcers Date Initiated: 12/25/2021 Date Inactivated: 04/11/2022 Target Resolution Date: 04/06/2022 Goal Status: Met Patient/caregiver will verbalize understanding of pressure ulcer management Date Initiated: 12/25/2021 Target Resolution Date: 06/07/2022 Goal Status: Active Interventions: Assess: immobility, friction, shearing, incontinence upon admission and as needed Assess offloading mechanisms upon admission and as needed Notes: Wound/Skin Impairment Nursing Diagnoses: Impaired tissue integrity Knowledge deficit related to ulceration/compromised skin integrity Goals: Patient will have a decrease in wound volume by X% from date: (specify in notes) Date Initiated: 12/20/2021 Date Inactivated: 01/08/2022 Target Resolution Date: 01/12/2022 Goal Status: Unmet Unmet Reason: pressure relief Patient/caregiver will verbalize understanding of skin care regimen Date Initiated: 12/20/2021 Target Resolution Date: 06/07/2022 Goal Status: Active Ulcer/skin breakdown will have a volume reduction of 30% by week 4 Date Initiated: 12/20/2021 Date Inactivated: 04/11/2022 Target Resolution Date: 04/06/2022 Goal Status: Unmet Unmet Reason: infection, osteo Ulcer/skin breakdown will have a volume reduction of 50% by week 8 Date Initiated: 12/20/2021 Date Inactivated: 04/11/2022 Target Resolution Date: 04/06/2022 Unmet Reason: osteo, severe Goal Status: Unmet kyphosis Interventions: Assess patient/caregiver ability to obtain necessary supplies Assess patient/caregiver ability to perform ulcer/skin care regimen upon admission and as needed Assess ulceration(s) every visit Notes: Electronic Signature(s) Signed: 06/05/2022 6:08:33 PM By: Baruch Gouty RN, BSN Entered By: Baruch Gouty on 06/04/2022 13:49:12 -------------------------------------------------------------------------------- Negative Pressure Wound Therapy Maintenance  (NPWT) Details Patient Name: Date of Service: [name], [name] 06/04/2022 1:15 PM Medical Record Number: [id number] Patient Account Number: [id number] Date of Birth/Sex: Treating RN: September 01, 1923 (88 y.o. Elam Dutch Primary Care Ranbir Chew: [name] Other Clinician: Referring Rossi Silvestro: Treating Durga Saldarriaga/Extender: Nicholaus Corolla in Treatment: 23 NPWT Maintenance Performed for: Wound #1 Thoracic spine Performed By: Baruch Gouty, RN Type: Other Coverage Size (sq cm): 0.63 Pressure Type: Constant Pressure Setting: 125 mmHG Drain Type: None Primary Contact: Other : prisma Sponge/Dressing Type: Foam, Black Date Initiated: 02/19/2022 Dressing Removed: No Quantity of Sponges/Gauze Removed: 1 Canister Changed: No Dressing Reapplied: No Quantity of Sponges/Gauze Inserted: 1 Respones T Treatment: o tolerating Days On NPWT : 106 Post Procedure Diagnosis Same as Pre-procedure Electronic Signature(s) Signed: 06/05/2022 6:08:33 PM By: Baruch Gouty RN, BSN Entered By: Baruch Gouty on 06/04/2022 13:51:42 -------------------------------------------------------------------------------- Pain Assessment Details Patient Name: Date of Service: [name], [name] 06/04/2022 1:15 PM Medical Record Number: [id number] Patient Account Number: [id number] Date of Birth/Sex: Treating RN: 08/30/1923 (88 y.o. Elam Dutch Primary Care Lorella Gomez: [name] Other Clinician: Referring Krisalyn Yankowski: Treating Cassidy Tashiro/Extender: Nicholaus Corolla in Treatment: 23 Active Problems Location of Pain  Severity and Description of Pain Patient Has Paino No Site Locations Rate the pain. Current Pain Level: 0 Character of Pain Describe the Pain: Tender Pain Management and Medication Current Pain Management: Electronic Signature(s) Signed: 06/05/2022 6:08:33 PM By: Baruch Gouty RN, BSN Entered By: Baruch Gouty on 06/04/2022  13:38:44 -------------------------------------------------------------------------------- Patient/Caregiver Education Details Patient Name: Date of Service: [name] 8/28/2023andnbsp1:15 PM Medical Record Number: [id number] Patient Account Number: [id number] Date of Birth/Gender: Treating RN: 1922/10/16 (88 y.o. Elam Dutch Primary Care Physician: [name] Other Clinician: Referring Physician: Treating Physician/Extender: Nicholaus Corolla in Treatment: 23 Education Assessment Education Provided To: Patient Education Topics Provided Pressure: Methods: Explain/Verbal Responses: Reinforcements needed, State content correctly Wound/Skin Impairment: Methods: Explain/Verbal Responses: Reinforcements needed, State content correctly Electronic Signature(s) Signed: 06/05/2022 6:08:33 PM By: Baruch Gouty RN, BSN Entered By: Baruch Gouty on 06/04/2022 13:49:34 -------------------------------------------------------------------------------- Wound Assessment Details Patient Name: Date of Service: [name], [name] 06/04/2022 1:15 PM Medical Record Number: [id number] Patient Account Number: [id number] Date of Birth/Sex: Treating RN: 05-28-1923 (88 y.o. Elam Dutch Primary Care Ingris Pasquarella: [name] Other Clinician: Referring Valiant Dills: Treating Konstantin Lehnen/Extender: Nicholaus Corolla in Treatment: 23 Wound Status Wound Number: 1 Primary Etiology: Pressure Ulcer Wound Location: Thoracic spine Wound Status: Open Wounding Event: Gradually Appeared Comorbid History: Hypertension, Type II Diabetes Date Acquired: 12/19/2020 Weeks Of Treatment: 23 Clustered Wound: No Photos Wound Measurements Length: (cm) 0.9 Width: (cm) 0.7 Depth: (cm) 0.8 Area: (cm) 0.495 Volume: (cm) 0.396 % Reduction in Area: 85.7% % Reduction in Volume: 92.4% Epithelialization: Small (1-33%) Tunneling: No Undermining:  Yes Starting Position (o'clock): 12 Ending Position (o'clock): 3 Maximum Distance: (cm) 4.3 Wound Description Classification: Category/Stage IV Wound Margin: Well defined, not attached Exudate Amount: Medium Exudate Type: Serosanguineous Exudate Color: red, brown Foul Odor After Cleansing: No Slough/Fibrino Yes Wound Bed Granulation Amount: Large (67-100%) Exposed Structure Granulation Quality: Red, Pink Fascia Exposed: No Necrotic Amount: None Present (0%) Fat Layer (Subcutaneous Tissue) Exposed: Yes Tendon Exposed: No Muscle Exposed: No Joint Exposed: No Bone Exposed: No Treatment Notes Wound #1 (Thoracic spine) Cleanser Wound Cleanser Discharge Instruction: Cleanse the wound with wound cleanser prior to applying a clean dressing using gauze sponges, not tissue or cotton balls. Peri-Wound Care Skin Prep Discharge Instruction: Use skin prep as directed Topical Primary Dressing Promogran Prisma Matrix, 4.34 (sq in) (silver collagen) Discharge Instruction: Moisten collagen with saline or hydrogel VAC Secondary Dressing Secured With Compression Wrap Compression Stockings Add-Ons Electronic Signature(s) Signed: 06/05/2022 6:08:33 PM By: Baruch Gouty RN, BSN Entered By: Baruch Gouty on 06/04/2022 13:47:22 -------------------------------------------------------------------------------- [place] Details Patient Name: Date of Service: [name] 06/04/2022 1:15 PM Medical Record Number: [id number] Patient Account Number: [id number] Date of Birth/Sex: Treating RN: 1923-01-24 (88 y.o. Elam Dutch Primary Care Autum Benfer: [name] Other Clinician: Referring Dallan Schonberg: Treating Mikaele Stecher/Extender: Nicholaus Corolla in Treatment: 23 Vital Signs Time Taken: 13:38 Temperature (F): 98.3 Height (in): 62 Pulse (bpm): 84 Weight (lbs): 110 Respiratory Rate (breaths/min): 18 Body Mass Index (BMI): 20.1 Blood Pressure (mmHg):  146/75 Reference Range: 80 - 120 mg / dl Electronic Signature(s) Signed: 06/05/2022 6:08:33 PM By: Baruch Gouty RN, BSN Entered By: Baruch Gouty on 06/04/2022 13:38:36

## 2022-06-05 NOTE — Progress Notes (Signed)
Stacy, Perry ([id number]) Visit Report for 06/04/2022 Chief Complaint Document Details Patient Name: Date of Service: [name], [name] 06/04/2022 1:15 PM Medical Record Number: [id number] Patient Account Number: [id number] Date of Birth/Sex: Treating RN: 10/22/1922 (88 y.o. Elam Dutch Primary Care Provider: [name] Other Clinician: Referring Provider: Treating Provider/Extender: Nicholaus Corolla in Treatment: 23 Information Obtained from: Patient Chief Complaint Patient is at the clinic for treatment of an open pressure ulcer Electronic Signature(s) Signed: 06/04/2022 1:58:15 PM By: Fredirick Maudlin MD FACS Entered By: Fredirick Maudlin on 06/04/2022 13:58:15 -------------------------------------------------------------------------------- HPI Details Patient Name: Date of Service: [name], [name] 06/04/2022 1:15 PM Medical Record Number: [id number] Patient Account Number: [id number] Date of Birth/Sex: Treating RN: [birth year] (88 y.o. Elam Dutch Primary Care Provider: [name] Other Clinician: Referring Provider: Treating Provider/Extender: Nicholaus Corolla in Treatment: 23 History of Present Illness HPI Description: ADMISSION 12/19/21 This is a 88 year old woman who is presenting to clinic today with a an open ulcer over her thoracic spine. She has severe kyphosis and a history of prior T12 fracture, as well as type 2 diabetes mellitus. The wound has been present for about a year. She is accompanied by her daughters, 1 of whom is a Marine scientist. They have been trying to offload the site by using pillows and supports to keep returned while in bed, and an eggcrate foam with a cut out for the wound for when she is sitting up. They have been applying Santyl to the site. She does have home health and the provider took a culture which was positive for Pseudomonas. She was on ciprofloxacin for this. A recent repeat culture  was negative. Most recent hemoglobin A1c was 6.5, in February. A C-reactive protein also obtained in February was elevated at 24.1. No imaging has been performed of the site. The patient denies significant pain. 12/25/2021: Last week, I took a bone biopsy as well as culture. Fortunately, the bone biopsy was negative for osteomyelitis. The culture returned with rare corynebacterium and rare Enterococcus faecalis. We have been using Santyl and Hydrofera Blue. T oday, the wound appears cleaner and there are buds of granulation tissue forming around the perimeter. 01/01/2022: Due to the positive culture, we switched to topical gentamicin. Today, she has had a little bit of a slough accumulation. The wound continues to have buds of granulation tissue forming. Apparently, the Hydrofera Blue was placed just over the wound opening rather than down in to contact the surface. No purulent drainage or odor. 01/08/2022: Last week, there was more slough in the wound bed, so I changed back to Santyl. There is now hypertrophic granulation tissue around the wound opening. Once again, we seem to be having some difficulty getting the Hydrofera Blue tucked up into the wound; there is undermining that extends for 3 to 4 cm at the 12 o'clock position and the Hydrofera Blue is simply sitting on top of the wound without being in contact with the surface. Bone is still palpable, but tissue is beginning to close in over it. 01/22/2022: The wound looks much cleaner this week and the hypertrophic granulation tissue responded appropriately to silver nitrate application. She continues to have undermining for about 4 cm from 12:00 to 4:00. I can still feel the bone in the base at about the 2 o'clock position, but it is less prominent and tissue is closing over. We have been using Santyl and Hydrofera Blue. The daughter that accompanies the patient today indicates that  they have been very diligent in making sure the [facility] is  tucked under and into the full base of the wound. 02/05/2022: The wound overall is improved. The undermining is about the same but there is less bone palpable. We have been using Santyl and Hydrofera Blue. Minimal slough accumulation in the 2 weeks since her last visit. 02/19/2022: The visible wound surface is very clean without any slough accumulation. Although I can feel bone in the undermined portion of the wound, it is now covered with a layer of tissue. The undermined area is about the same size, however. We have been using Santyl with Hydrofera Blue. She has been approved for snap VAC use. 02/26/2022: Last week, we applied a snap VAC. The wound dimensions have come in by a bit in all directions. The VAC canister did fill, however, and was starting to leak so the patient's daughter removed the VAC and dressed the wound as we had been previously. There is a little bit of thin slough on the wound surface with some senescent tissue at the inferior margin. Bone is still palpable but covered with a layer of tissue. No odor. 03/06/2022: We continue to have technical difficulties with the snap VAC which resulted in the patient's family removing it and going back to the dressing changes with Hydrofera Blue. It sounds like there are issues maintaining suction and the drape is getting bunched up creating a leak. The wound measured larger today, but the intake nurse thinks that perhaps her measurements were taken slightly differently than last week's. There is just some thin slough on the wound surface as well as some hypertrophic granulation tissue on the lateral border. 03/13/2022: The snap VAC worked much better this week and only began leaking yesterday. At that point, the patient's family removed it and applied Hydrofera Blue to her wound. Today, the tissue coverage over the bone that had been exposed is more substantial. She still has undermining present. There is also some senescent skin around the wound  orifice along with some hypertrophic granulation tissue. 03/20/2022: The snap VAC unfortunately failed on Friday. There has really been no significant change to the wound for several weeks. The degree of undermining is unchanged. The surface is clean. 03/27/2022: Once again, the snap VAC failed prematurely and had to be removed on Saturday. The undermining remains the same. There is more tissue coverage of the previously exposed bone. The wound surface has just a light layer of biofilm and thin slough. She was approved for a standard wound VAC, but there are currently no home health agencies able to staff dressing changes for her; it is too difficult for her and her family to bring her to the wound center more than once a week. 04/03/2022: The snap VAC stayed on but the canister filled up on Sunday. The direct depth of the wound is a little bit shallower but the undermining has not really changed. There is some slightly discolored, darker tissue at the 9 o'clock position. No odor or purulent drainage. 04/11/2022: Using 2 canisters worked well and the snap VAC lasted until yesterday. She has her standard wound VAC with her today. The orifice of the wound has contracted somewhat. There is still substantial undermining at 12:00, but the undermined portion to the right between 2 and 4:00 has closed and somewhat. The previously exposed bone is no longer even palpable under the overlying tissue. No concern for infection. 04/17/2022: A standard VAC dressing was initiated last week. The family has been  changing it at home, due to the inability to secure home health services. It is a little bit challenging to apply, due to the small orifice of the wound with substantial undermining. Today, the undermining at 12:00 seems like it might be a little bit deeper, but the 2-4 o'clock undermining is decreased. There is excellent tissue coverage over the previously exposed bone. There is a little bit of slough accumulation  at the orifice, along with some senescent skin. 04/23/2022: The wound VAC was not changed all week due to the family being overwhelmed with other issues. Fortunately, there does not seem to have been any untoward effects from this. The orifice of the wound remains unchanged but the undermining and tunneling has narrowed to just 1 area at 12:00. No purulent drainage or odor. Good tissue coverage of the previously exposed bone. 04/30/2022: The tunneling has narrowed substantially. There is no purulent drainage or odor from the wound. The orifice of the wound is about the same size and there is some hypertrophic granulation tissue at the opening. 05/07/2022: The undermining has come in substantially. The only particularly deep area is from 1-2 o'clock. The rest of the wound is clean without any purulent drainage or odor. 05/14/2022: The orifice of the wound has contracted further. She continues to have just under 5 cm of tunneling at 12:00, but the wound is clean without any purulent drainage or odor. 05/21/2022: The tunnel has come in by about half a centimeter. The wound is clean without any slough. 05/28/2022: The tunnel continues to contract. The wound is clean with good granulation tissue at the base. No concern for infection. 06/04/2022: The tunnel is about the same length this week, but it is quite a bit narrower. The wound is clean with good granulation tissue. Unfortunately, her family applied the track pad directly over the sponge without any intervening drape and without bridging it away from her spine so she has a small ulcer from the track pad just caudal to the wound. Electronic Signature(s) Signed: 06/04/2022 1:59:01 PM By: Fredirick Maudlin MD FACS Entered By: Fredirick Maudlin on 06/04/2022 13:59:01 -------------------------------------------------------------------------------- Physical Exam Details Patient Name: Date of Service: [name], [name] 06/04/2022 1:15 PM Medical Record Number:  [id number] Patient Account Number: [id number] Date of Birth/Sex: Treating RN: 1923/09/23 (88 y.o. Elam Dutch Primary Care Provider: [name] Other Clinician: Referring Provider: Treating Provider/Extender: Nicholaus Corolla in Treatment: 23 Constitutional Slightly hypertensive. . . . No acute distress.Marland Kitchen Respiratory Normal work of breathing on room air.. Notes 06/04/2022: The tunnel is about the same length this week, but it is quite a bit narrower. The wound is clean with good granulation tissue. Unfortunately, her family applied the track pad directly over the sponge without any intervening drape and without bridging it away from her spine so she has a small ulcer from the track pad just caudal to the wound. Electronic Signature(s) Signed: 06/04/2022 2:00:12 PM By: Fredirick Maudlin MD FACS Entered By: Fredirick Maudlin on 06/04/2022 14:00:12 -------------------------------------------------------------------------------- Physician Orders Details Patient Name: Date of Service: KYAH, BUESING 06/04/2022 1:15 PM Medical Record Number: [id number] Patient Account Number: [id number] Date of Birth/Sex: Treating RN: [birth year] (88 y.o. Elam Dutch Primary Care Provider: [name] Other Clinician: Referring Provider: Treating Provider/Extender: Nicholaus Corolla in Treatment: 23 Verbal / Phone Orders: No Diagnosis Coding ICD-10 Coding Code Description L89.104 Pressure ulcer of unspecified part of back, stage 4 E11.622 Type 2 diabetes mellitus with other skin ulcer S22.089S  Unspecified fracture of T11-T12 vertebra, sequela Follow-up Appointments ppointment in 1 week. - Dr Celine Ahr - Room 1 - with Vaughan Basta Return A Tuesday 9/5 @ 2:00 pm ppointment in 2 weeks. - Dr Celine Ahr - Room 1 - with Vaughan Basta Return A Monday 9/11 @ 2:00 pm Anesthetic Wound #1 Thoracic spine (In clinic) Topical Lidocaine 5% applied to wound  bed Bathing/ Shower/ Hygiene May shower with protection but do not get wound dressing(s) wet. Negative Presssure Wound Therapy Wound Vac to wound continuously at 163m/hg pressure Black Foam Off-Loading Turn and reposition every 2 hours Other: - KEEP PRESSURE OFF OF BACK MAY USE EGG CRATE CUSHION Additional Orders / Instructions Follow Nutritious Diet Wound Treatment Wound #1 - Thoracic spine Cleanser: Wound Cleanser (Generic) 2 x Per Week/30 Days Discharge Instructions: Cleanse the wound with wound cleanser prior to applying a clean dressing using gauze sponges, not tissue or cotton balls. Peri-Wound Care: Skin Prep 2 x Per Week/30 Days Discharge Instructions: Use skin prep as directed Prim Dressing: Promogran Prisma Matrix, 4.34 (sq in) (silver collagen) 2 x Per Week/30 Days ary Discharge Instructions: Moisten collagen with saline or hydrogel Prim Dressing: VAC ary 2 x Per Week/30 Days Electronic Signature(s) Signed: 06/04/2022 3:44:00 PM By: CFredirick MaudlinMD FACS Entered By: CFredirick Maudlinon 06/04/2022 14:03:55 -------------------------------------------------------------------------------- Problem List Details Patient Name: Date of Service: HMarijean Heath8/28/2023 1:15 PM Medical Record Number: [id number] Account Number: [id number] of Birth/Sex: Treating RN: [id number](88y.o. FElam DutchPrimary Care Provider: RJanace LittenOther Clinician: Referring Provider: Treating Provider/Extender: CNicholaus Corollain Treatment: 23 Active Problems ICD-10 Encounter Code Description Active Date MDM Diagnosis L89.104 Pressure ulcer of unspecified part of back, stage 4 12/19/2021 No Yes E11.622 Type 2 diabetes mellitus with other skin ulcer 12/19/2021 No Yes S22.089S Unspecified fracture of T11-T12 vertebra, sequela 12/19/2021 No Yes Inactive Problems Resolved Problems Electronic Signature(s) Signed: 06/04/2022 1:58:03 PM By:  CFredirick MaudlinMD FACS Entered By: CFredirick Maudlinon 06/04/2022 13:58:03 -------------------------------------------------------------------------------- Progress Note Details Patient Name: Date of Service: HMarijean Heath8/28/2023 1:15 PM Medical Record Number: [id number] Account Number: [id number] of Birth/Sex: Treating RN: [birth year](88y.o. FElam DutchPrimary Care Provider: RJanace LittenOther Clinician: Referring Provider: Treating Provider/Extender: CNicholaus Corollain Treatment: 23 Subjective Chief Complaint Information obtained from Patient Patient is at the clinic for treatment of an open pressure ulcer History of Present Illness (HPI) ADMISSION 12/19/21 This is a 88year old woman who is presenting to clinic today with a an open ulcer over her thoracic spine. She has severe kyphosis and a history of prior T12 fracture, as well as type 2 diabetes mellitus. The wound has been present for about a year. She is accompanied by her daughters, 1 of whom is a nMarine scientist They have been trying to offload the site by using pillows and supports to keep returned while in bed, and an eggcrate foam with a cut out for the wound for when she is sitting up. They have been applying Santyl to the site. She does have home health and the provider took a culture which was positive for Pseudomonas. She was on ciprofloxacin for this. A recent repeat culture was negative. Most recent hemoglobin A1c was 6.5, in February. A C-reactive protein also obtained in February was elevated at 24.1. No imaging has been performed of the site. The patient denies significant pain. 12/25/2021: Last week, I took a bone biopsy as well as culture. Fortunately,  the bone biopsy was negative for osteomyelitis. The culture returned with rare corynebacterium and rare Enterococcus faecalis. We have been using Santyl and Hydrofera Blue. T oday, the wound appears cleaner and there are  buds of granulation tissue forming around the perimeter. 01/01/2022: Due to the positive culture, we switched to topical gentamicin. Today, she has had a little bit of a slough accumulation. The wound continues to have buds of granulation tissue forming. Apparently, the Hydrofera Blue was placed just over the wound opening rather than down in to contact the surface. No purulent drainage or odor. 01/08/2022: Last week, there was more slough in the wound bed, so I changed back to Santyl. There is now hypertrophic granulation tissue around the wound opening. Once again, we seem to be having some difficulty getting the Hydrofera Blue tucked up into the wound; there is undermining that extends for 3 to 4 cm at the 12 o'clock position and the Hydrofera Blue is simply sitting on top of the wound without being in contact with the surface. Bone is still palpable, but tissue is beginning to close in over it. 01/22/2022: The wound looks much cleaner this week and the hypertrophic granulation tissue responded appropriately to silver nitrate application. She continues to have undermining for about 4 cm from 12:00 to 4:00. I can still feel the bone in the base at about the 2 o'clock position, but it is less prominent and tissue is closing over. We have been using Santyl and Hydrofera Blue. The daughter that accompanies the patient today indicates that they have been very diligent in making sure the [facility] is tucked under and into the full base of the wound. 02/05/2022: The wound overall is improved. The undermining is about the same but there is less bone palpable. We have been using Santyl and Hydrofera Blue. Minimal slough accumulation in the 2 weeks since her last visit. 02/19/2022: The visible wound surface is very clean without any slough accumulation. Although I can feel bone in the undermined portion of the wound, it is now covered with a layer of tissue. The undermined area is about the same size,  however. We have been using Santyl with Hydrofera Blue. She has been approved for snap VAC use. 02/26/2022: Last week, we applied a snap VAC. The wound dimensions have come in by a bit in all directions. The VAC canister did fill, however, and was starting to leak so the patient's daughter removed the VAC and dressed the wound as we had been previously. There is a little bit of thin slough on the wound surface with some senescent tissue at the inferior margin. Bone is still palpable but covered with a layer of tissue. No odor. 03/06/2022: We continue to have technical difficulties with the snap VAC which resulted in the patient's family removing it and going back to the dressing changes with Hydrofera Blue. It sounds like there are issues maintaining suction and the drape is getting bunched up creating a leak. The wound measured larger today, but the intake nurse thinks that perhaps her measurements were taken slightly differently than last week's. There is just some thin slough on the wound surface as well as some hypertrophic granulation tissue on the lateral border. 03/13/2022: The snap VAC worked much better this week and only began leaking yesterday. At that point, the patient's family removed it and applied Hydrofera Blue to her wound. Today, the tissue coverage over the bone that had been exposed is more substantial. She still has undermining present.  There is also some senescent skin around the wound orifice along with some hypertrophic granulation tissue. 03/20/2022: The snap VAC unfortunately failed on Friday. There has really been no significant change to the wound for several weeks. The degree of undermining is unchanged. The surface is clean. 03/27/2022: Once again, the snap VAC failed prematurely and had to be removed on Saturday. The undermining remains the same. There is more tissue coverage of the previously exposed bone. The wound surface has just a light layer of biofilm and thin slough.  She was approved for a standard wound VAC, but there are currently no home health agencies able to staff dressing changes for her; it is too difficult for her and her family to bring her to the wound center more than once a week. 04/03/2022: The snap VAC stayed on but the canister filled up on Sunday. The direct depth of the wound is a little bit shallower but the undermining has not really changed. There is some slightly discolored, darker tissue at the 9 o'clock position. No odor or purulent drainage. 04/11/2022: Using 2 canisters worked well and the snap VAC lasted until yesterday. She has her standard wound VAC with her today. The orifice of the wound has contracted somewhat. There is still substantial undermining at 12:00, but the undermined portion to the right between 2 and 4:00 has closed and somewhat. The previously exposed bone is no longer even palpable under the overlying tissue. No concern for infection. 04/17/2022: A standard VAC dressing was initiated last week. The family has been changing it at home, due to the inability to secure home health services. It is a little bit challenging to apply, due to the small orifice of the wound with substantial undermining. Today, the undermining at 12:00 seems like it might be a little bit deeper, but the 2-4 o'clock undermining is decreased. There is excellent tissue coverage over the previously exposed bone. There is a little bit of slough accumulation at the orifice, along with some senescent skin. 04/23/2022: The wound VAC was not changed all week due to the family being overwhelmed with other issues. Fortunately, there does not seem to have been any untoward effects from this. The orifice of the wound remains unchanged but the undermining and tunneling has narrowed to just 1 area at 12:00. No purulent drainage or odor. Good tissue coverage of the previously exposed bone. 04/30/2022: The tunneling has narrowed substantially. There is no purulent  drainage or odor from the wound. The orifice of the wound is about the same size and there is some hypertrophic granulation tissue at the opening. 05/07/2022: The undermining has come in substantially. The only particularly deep area is from 1-2 o'clock. The rest of the wound is clean without any purulent drainage or odor. 05/14/2022: The orifice of the wound has contracted further. She continues to have just under 5 cm of tunneling at 12:00, but the wound is clean without any purulent drainage or odor. 05/21/2022: The tunnel has come in by about half a centimeter. The wound is clean without any slough. 05/28/2022: The tunnel continues to contract. The wound is clean with good granulation tissue at the base. No concern for infection. 06/04/2022: The tunnel is about the same length this week, but it is quite a bit narrower. The wound is clean with good granulation tissue. Unfortunately, her family applied the track pad directly over the sponge without any intervening drape and without bridging it away from her spine so she has a small ulcer from  the track pad just caudal to the wound. Patient History Family History Unknown History. Social History Never smoker, Marital Status - Married, Alcohol Use - Never, Drug Use - No History, Caffeine Use - Rarely. Medical History Ear/Nose/Mouth/Throat Denies history of Chronic sinus problems/congestion, Middle ear problems Cardiovascular Patient has history of Hypertension Endocrine Patient has history of Type II Diabetes Immunological Denies history of Lupus Erythematosus, Raynaudoos, Scleroderma Oncologic Denies history of Received Chemotherapy, Received Radiation Hospitalization/Surgery History - t/11 t/12 FRACTURE/SURGERY. - FEMUR FX SURGERY. - TOTAL HYSTERECTOMY. - CHOLECYSTECTOMY. - TONSILLECTOMY. Medical A Surgical History Notes nd Ear/Nose/Mouth/Throat PARTIAL HEARING LEFT HEAR W/ HEARING AID TOTAL DEAFNESS RIGHT  EAR Gastrointestinal Melena Endocrine Hypothyroidism Hyperlipidemia associated with type 2 diabetes mellitus (HCC) Musculoskeletal Closed T12 fracture (HCC) Hip fracture (HCC) Closed comminuted intertrochanteric fracture of proximal end of right femur (HCC) Objective Constitutional Slightly hypertensive. No acute distress.. Vitals Time Taken: 1:38 PM, Height: 62 in, Weight: 110 lbs, BMI: 20.1, Temperature: 98.3 F, Pulse: 84 bpm, Respiratory Rate: 18 breaths/min, Blood Pressure: 146/75 mmHg. Respiratory Normal work of breathing on room air.. General Notes: 06/04/2022: The tunnel is about the same length this week, but it is quite a bit narrower. The wound is clean with good granulation tissue. Unfortunately, her family applied the track pad directly over the sponge without any intervening drape and without bridging it away from her spine so she has a small ulcer from the track pad just caudal to the wound. Integumentary (Hair, Skin) Wound #1 status is Open. Original cause of wound was Gradually Appeared. The date acquired was: 12/19/2020. The wound has been in treatment 23 weeks. The wound is located on the Thoracic spine. The wound measures 0.9cm length x 0.7cm width x 0.8cm depth; 0.495cm^2 area and 0.396cm^3 volume. There is Fat Layer (Subcutaneous Tissue) exposed. There is no tunneling noted, however, there is undermining starting at 12:00 and ending at 3:00 with a maximum distance of 4.3cm. There is a medium amount of serosanguineous drainage noted. The wound margin is well defined and not attached to the wound base. There is large (67-100%) red, pink granulation within the wound bed. There is no necrotic tissue within the wound bed. Assessment Active Problems ICD-10 Pressure ulcer of unspecified part of back, stage 4 Type 2 diabetes mellitus with other skin ulcer Unspecified fracture of T11-T12 vertebra, sequela Plan Follow-up Appointments: Return Appointment in 1 week. - Dr  Celine Ahr - Room 1 - with [facility] Tuesday 9/5 @ 2:00 pm Return Appointment in 2 weeks. - Dr Celine Ahr - Room 1 - with [facility] Monday 9/11 @ 2:00 pm Anesthetic: Wound #1 Thoracic spine: (In clinic) Topical Lidocaine 5% applied to wound bed Bathing/ Shower/ Hygiene: May shower with protection but do not get wound dressing(s) wet. Negative Presssure Wound Therapy: Wound Vac to wound continuously at 156m/hg pressure Black Foam Off-Loading: Turn and reposition every 2 hours Other: - KEEP PRESSURE OFF OF BACK MAY USE EGG CRATE CUSHION Additional Orders / Instructions: Follow Nutritious Diet WOUND #1: - Thoracic spine Wound Laterality: Cleanser: Wound Cleanser (Generic) 2 x Per Week/30 Days Discharge Instructions: Cleanse the wound with wound cleanser prior to applying a clean dressing using gauze sponges, not tissue or cotton balls. Peri-Wound Care: Skin Prep 2 x Per Week/30 Days Discharge Instructions: Use skin prep as directed Prim Dressing: Promogran Prisma Matrix, 4.34 (sq in) (silver collagen) 2 x Per Week/30 Days ary Discharge Instructions: Moisten collagen with saline or hydrogel Prim Dressing: VAC 2 x Per Week/30 Days ary 06/04/2022: The  tunnel is about the same length this week, but it is quite a bit narrower. The wound is clean with good granulation tissue. Unfortunately, her family applied the track pad directly over the sponge without any intervening drape and without bridging it away from her spine so she has a small ulcer from the track pad just caudal to the wound. No debridement was necessary today. We reviewed correct wound VAC application with the patient's family so that it will be applied properly at home. We will place a small piece of silver alginate over the small ulcer caused by the track pad. Continue negative pressure wound therapy with silver collagen in the wound base. Follow-up in 1 week. Electronic Signature(s) Signed: 06/04/2022 2:05:09 PM By: Fredirick Maudlin MD  FACS Entered By: Fredirick Maudlin on 06/04/2022 14:05:09 -------------------------------------------------------------------------------- HxROS Details Patient Name: Date of Service: [name], [name] 06/04/2022 1:15 PM Medical Record Number: [id number] Patient Account Number: [id number] Date of Birth/Sex: Treating RN: 04-13-1923 (88 y.o. Elam Dutch Primary Care Provider: [name] Other Clinician: Referring Provider: Treating Provider/Extender: Nicholaus Corolla in Treatment: 23 Ear/Nose/Mouth/Throat Medical History: Negative for: Chronic sinus problems/congestion; Middle ear problems Past Medical History Notes: PARTIAL HEARING LEFT HEAR W/ HEARING AID TOTAL DEAFNESS RIGHT EAR Cardiovascular Medical History: Positive for: Hypertension Gastrointestinal Medical History: Past Medical History Notes: Melena Endocrine Medical History: Positive for: Type II Diabetes Past Medical History Notes: Hypothyroidism Hyperlipidemia associated with type 2 diabetes mellitus ([place]) Treated with: Diet Immunological Medical History: Negative for: Lupus Erythematosus; Raynauds; Scleroderma Musculoskeletal Medical History: Past Medical History Notes: Closed T12 fracture ([place]) Hip fracture ([place]) Closed comminuted intertrochanteric fracture of proximal end of right femur [facility]) Oncologic Medical History: Negative for: Received Chemotherapy; Received Radiation Immunizations Pneumococcal Vaccine: Received Pneumococcal Vaccination: Yes Received Pneumococcal Vaccination On or After 60th Birthday: No Implantable Devices None Hospitalization / Surgery History Type of Hospitalization/Surgery t/11 t/12 FRACTURE/SURGERY FEMUR FX SURGERY TOTAL HYSTERECTOMY CHOLECYSTECTOMY TONSILLECTOMY Family and Social History Unknown History: Yes; Never smoker; Marital Status - Married; Alcohol Use: Never; Drug Use: No History; Caffeine Use: Rarely; Financial Concerns:  No; Food, Clothing or Shelter Needs: No; Support System Lacking: No; Transportation Concerns: No Engineer, maintenance) Signed: 06/04/2022 3:44:00 PM By: Fredirick Maudlin MD FACS Signed: 06/05/2022 6:08:33 PM By: Baruch Gouty RN, BSN Entered By: Fredirick Maudlin on 06/04/2022 13:59:26 -------------------------------------------------------------------------------- [place] Details Patient Name: Date of Service: Marijean Heath 06/04/2022 Medical Record Number: [id number] Patient Account Number: [id number] Date of Birth/Sex: Treating RN: Oct 09, 1922 (88 y.o. Elam Dutch Primary Care Provider: [name] Other Clinician: Referring Provider: Treating Provider/Extender: Nicholaus Corolla in Treatment: 23 Diagnosis Coding ICD-10 Codes Code Description L89.104 Pressure ulcer of unspecified part of back, stage 4 E11.622 Type 2 diabetes mellitus with other skin ulcer S22.089S Unspecified fracture of T11-T12 vertebra, sequela Facility Procedures Physician Procedures : CPT4 Code Description Modifier [id number] - WC PHYS LEVEL 4 - EST PT ICD-10 Diagnosis Description L89.104 Pressure ulcer of unspecified part of back, stage 4 E11.622 Type 2 diabetes mellitus with other skin ulcer S22.089S Unspecified fracture of  T11-T12 vertebra, sequela Quantity: 1 Electronic Signature(s) Signed: 06/04/2022 3:44:00 PM By: Fredirick Maudlin MD FACS Signed: 06/05/2022 6:08:33 PM By: Baruch Gouty RN, BSN Previous Signature: 06/04/2022 2:05:38 PM Version By: Fredirick Maudlin MD FACS Entered By: Baruch Gouty on 06/04/2022 14:10:06

## 2022-06-12 ENCOUNTER — Encounter (HOSPITAL_BASED_OUTPATIENT_CLINIC_OR_DEPARTMENT_OTHER): Payer: Medicare Other | Attending: General Surgery | Admitting: General Surgery

## 2022-06-12 DIAGNOSIS — X58XXXA Exposure to other specified factors, initial encounter: Secondary | ICD-10-CM | POA: Insufficient documentation

## 2022-06-12 DIAGNOSIS — E1151 Type 2 diabetes mellitus with diabetic peripheral angiopathy without gangrene: Secondary | ICD-10-CM | POA: Insufficient documentation

## 2022-06-12 DIAGNOSIS — S22089A Unspecified fracture of T11-T12 vertebra, initial encounter for closed fracture: Secondary | ICD-10-CM | POA: Insufficient documentation

## 2022-06-12 DIAGNOSIS — L89104 Pressure ulcer of unspecified part of back, stage 4: Secondary | ICD-10-CM | POA: Insufficient documentation

## 2022-06-12 DIAGNOSIS — E11622 Type 2 diabetes mellitus with other skin ulcer: Secondary | ICD-10-CM | POA: Diagnosis not present

## 2022-06-12 DIAGNOSIS — M40209 Unspecified kyphosis, site unspecified: Secondary | ICD-10-CM | POA: Diagnosis not present

## 2022-06-13 NOTE — Progress Notes (Signed)
MAHDIYA, MOSSBERG ([id number]) Visit Report for 06/12/2022 Arrival Information Details Patient Name: Date of Service: [name], [name] 06/12/2022 2:00 PM Medical Record Number: [id number] Patient Account Number: [id number] Date of Birth/Sex: Treating RN: [birth year] (88 y.o. [name] Primary Care Tymber Stallings: [name] Other Clinician: Referring Jaliya Siegmann: Treating Malynn Lucy/Extender: Nicholaus Corolla in Treatment: 25 Visit Information History Since Last Visit Added or deleted any medications: No Patient Arrived: Wheel Chair Any new allergies or adverse reactions: No Arrival Time: 14:37 Had a fall or experienced change in No Accompanied By: daughter activities of daily living that may affect Transfer Assistance: Manual risk of falls: Patient Requires Transmission-Based Precautions: No Signs or symptoms of abuse/neglect since last visito No Patient Has Alerts: No Hospitalized since last visit: No Implantable device outside of the clinic excluding No cellular tissue based products placed in the center since last visit: Has Dressing in Place as Prescribed: Yes Pain Present Now: No Electronic Signature(s) Signed: 06/12/2022 4:56:45 PM By: Sharyn Creamer RN, BSN Entered By: Sharyn Creamer on 06/12/2022 14:38:41 -------------------------------------------------------------------------------- Encounter Discharge Information Details Patient Name: Date of Service: [name], Stacy Cola B. 06/12/2022 2:00 PM Medical Record Number: [id number] Patient Account Number: [id number] Date of Birth/Sex: Treating RN: 04-24-1923 (88 y.o. [name] Primary Care Lorenda Grecco: [name] Other Clinician: Referring Mahala Rommel: Treating Cailynn Bodnar/Extender: Nicholaus Corolla in Treatment: 25 Encounter Discharge Information Items Discharge Condition: Stable Ambulatory Status: Wheelchair Discharge Destination: Home Transportation: Private Auto Accompanied  By: daughter Schedule Follow-up Appointment: Yes Clinical Summary of Care: Patient Declined Electronic Signature(s) Signed: 06/12/2022 4:56:45 PM By: Sharyn Creamer RN, BSN Entered By: Sharyn Creamer on 06/12/2022 16:39:29 -------------------------------------------------------------------------------- Lower Extremity Assessment Details Patient Name: Date of Service: Stacy Like B. 06/12/2022 2:00 PM Medical Record Number: [id number] Patient Account Number: [id number] Date of Birth/Sex: Treating RN: 1923/09/01 (88 y.o. [name] Primary Care Leiland Mihelich: [name] Other Clinician: Referring Canuto Kingston: Treating Daeshon Grammatico/Extender: Nicholaus Corolla in Treatment: 25 Electronic Signature(s) Signed: 06/12/2022 4:56:45 PM By: Sharyn Creamer RN, BSN Entered By: Sharyn Creamer on 06/12/2022 14:39:14 -------------------------------------------------------------------------------- Multi Wound Chart Details Patient Name: Date of Service: [name], Stacy Cola B. 06/12/2022 2:00 PM Medical Record Number: [id number] Patient Account Number: [id number] Date of Birth/Sex: Treating RN: 1923/06/21 (88 y.o. [name] Primary Care Labrisha Wuellner: [name] Other Clinician: Referring Dakia Schifano: Treating Pragya Lofaso/Extender: Nicholaus Corolla in Treatment: 25 Vital Signs Height(in): 62 Pulse(bpm): 99 Weight(lbs): 110 Blood Pressure(mmHg): 121/68 Body Mass Index(BMI): 20.1 Temperature(F): 97.9 Respiratory Rate(breaths/min): 18 Photos: [N/A:N/A] Thoracic spine N/A N/A Wound Location: Gradually Appeared N/A N/A Wounding Event: Pressure Ulcer N/A N/A Primary Etiology: Hypertension, Type II Diabetes N/A N/A Comorbid History: 12/19/2020 N/A N/A Date Acquired: 25 N/A N/A Weeks of Treatment: Open N/A N/A Wound Status: No N/A N/A Wound Recurrence: 1.2x1x0.8 N/A N/A Measurements L x W x D (cm) 0.942 N/A N/A A (cm) : rea 0.754 N/A  N/A Volume (cm) : 72.70% N/A N/A % Reduction in A rea: 85.50% N/A N/A % Reduction in Volume: 12 Position 1 (o'clock): 4.1 Maximum Distance 1 (cm): Yes N/A N/A Tunneling: Category/Stage IV N/A N/A Classification: Medium N/A N/A Exudate A mount: Serosanguineous N/A N/A Exudate Type: red, brown N/A N/A Exudate Color: Well defined, not attached N/A N/A Wound Margin: Large (67-100%) N/A N/A Granulation A mount: Red, Pink N/A N/A Granulation Quality: None Present (0%) N/A N/A Necrotic Amount: Fat Layer (Subcutaneous Tissue): Yes N/A N/A Exposed Structures: Fascia: No Tendon: No Muscle: No Joint:  No Bone: No Small (1-33%) N/A N/A Epithelialization: Treatment Notes Electronic Signature(s) Signed: 06/12/2022 3:00:00 PM By: Fredirick Maudlin MD FACS Signed: 06/13/2022 5:54:13 PM By: Baruch Gouty RN, BSN Entered By: Fredirick Maudlin on 06/12/2022 15:00:00 -------------------------------------------------------------------------------- Multi-Disciplinary Care Plan Details Patient Name: Date of Service: Stacy Like B. 06/12/2022 2:00 PM Medical Record Number: [id number] Patient Account Number: [id number] Date of Birth/Sex: Treating RN: 04-23-1923 (88 y.o. [name] Primary Care Kijana Cromie: [name] Other Clinician: Referring Calie Buttrey: Treating Jensen Kilburg/Extender: Nicholaus Corolla in Treatment: 25 Multidisciplinary Care Plan reviewed with physician Active Inactive Pressure Nursing Diagnoses: Knowledge deficit related to causes and risk factors for pressure ulcer development Knowledge deficit related to management of pressures ulcers Potential for impaired tissue integrity related to pressure, friction, moisture, and shear Goals: Patient will remain free of pressure ulcers Date Initiated: 12/25/2021 Date Inactivated: 04/11/2022 Target Resolution Date: 04/06/2022 Goal Status: Met Patient/caregiver will verbalize understanding of  pressure ulcer management Date Initiated: 12/25/2021 Target Resolution Date: 07/05/2022 Goal Status: Active Interventions: Assess: immobility, friction, shearing, incontinence upon admission and as needed Assess offloading mechanisms upon admission and as needed Notes: Wound/Skin Impairment Nursing Diagnoses: Impaired tissue integrity Knowledge deficit related to ulceration/compromised skin integrity Goals: Patient will have a decrease in wound volume by X% from date: (specify in notes) Date Initiated: 12/20/2021 Date Inactivated: 01/08/2022 Target Resolution Date: 01/12/2022 Goal Status: Unmet Unmet Reason: pressure relief Patient/caregiver will verbalize understanding of skin care regimen Date Initiated: 12/20/2021 Target Resolution Date: 07/05/2022 Goal Status: Active Ulcer/skin breakdown will have a volume reduction of 30% by week 4 Date Initiated: 12/20/2021 Date Inactivated: 04/11/2022 Target Resolution Date: 04/06/2022 Goal Status: Unmet Unmet Reason: infection, osteo Ulcer/skin breakdown will have a volume reduction of 50% by week 8 Date Initiated: 12/20/2021 Date Inactivated: 04/11/2022 Target Resolution Date: 04/06/2022 Unmet Reason: osteo, severe Goal Status: Unmet kyphosis Interventions: Assess patient/caregiver ability to obtain necessary supplies Assess patient/caregiver ability to perform ulcer/skin care regimen upon admission and as needed Assess ulceration(s) every visit Notes: Electronic Signature(s) Signed: 06/12/2022 4:56:45 PM By: Sharyn Creamer RN, BSN Entered By: Sharyn Creamer on 06/12/2022 14:47:59 -------------------------------------------------------------------------------- Negative Pressure Wound Therapy Maintenance (NPWT) Details Patient Name: Date of Service: [name], [name] 06/12/2022 2:00 PM Medical Record Number: [id number] Patient Account Number: [id number] Date of Birth/Sex: Treating RN: 12-10-1922 (88 y.o. [name] Primary Care Eleasha Cataldo:  [name] Other Clinician: Referring Indi Willhite: Treating Adair Lauderback/Extender: Nicholaus Corolla in Treatment: 25 NPWT Maintenance Performed for: Wound #1 Thoracic spine Performed By: Sharyn Creamer, RN Type: Other Coverage Size (sq cm): 1.2 Pressure Type: Constant Pressure Setting: 125 mmHG Drain Type: None Sponge/Dressing Type: Foam, Black Date Initiated: 02/19/2022 Dressing Removed: No Quantity of Sponges/Gauze Removed: 1 black Canister Changed: No Dressing Reapplied: No Quantity of Sponges/Gauze Inserted: 1 Respones T Treatment: o pt tolerated well Days On NPWT : 114 Post Procedure Diagnosis Same as Pre-procedure Electronic Signature(s) Signed: 06/12/2022 4:56:45 PM By: Sharyn Creamer RN, BSN Entered By: Sharyn Creamer on 06/12/2022 15:14:31 -------------------------------------------------------------------------------- Pain Assessment Details Patient Name: Date of Service: Stacy Like B. 06/12/2022 2:00 PM Medical Record Number: [id number] Patient Account Number: [id number] Date of Birth/Sex: Treating RN: Jun 06, 1923 (88 y.o. [name] Primary Care Tabria Steines: [name] Other Clinician: Referring Eldridge Marcott: Treating Jaselle Pryer/Extender: Nicholaus Corolla in Treatment: 25 Active Problems Location of Pain Severity and Description of Pain Patient Has Paino No Site Locations Pain Management and Medication Current Pain Management: Electronic Signature(s) Signed: 06/12/2022 4:56:45 PM By: Phineas Douglas,  Morey Hummingbird RN, BSN Entered By: Sharyn Creamer on 06/12/2022 14:39:08 -------------------------------------------------------------------------------- Patient/Caregiver Education Details Patient Name: Date of Service: [name], [name] 9/5/2023andnbsp2:00 PM Medical Record Number: [id number] Patient Account Number: [id number] Date of Birth/Gender: Treating RN: 08-26-1923 (88 y.o. [name] Primary Care Physician:  [name] Other Clinician: Referring Physician: Treating Physician/Extender: Nicholaus Corolla in Treatment: 25 Education Assessment Education Provided To: Patient Education Topics Provided Wound/Skin Impairment: Methods: Printed Responses: State content correctly [employer]) Signed: 06/12/2022 4:56:45 PM By: Sharyn Creamer RN, BSN Entered By: Sharyn Creamer on 06/12/2022 14:48:30 -------------------------------------------------------------------------------- Wound Assessment Details Patient Name: Date of Service: Stacy Like B. 06/12/2022 2:00 PM Medical Record Number: [id number] Patient Account Number: [id number] Date of Birth/Sex: Treating RN: 1923-07-11 (88 y.o. [name] Primary Care Etsuko Dierolf: [name] Other Clinician: Referring Vasti Yagi: Treating Adriena Manfre/Extender: Nicholaus Corolla in Treatment: 25 Wound Status Wound Number: 1 Primary Etiology: Pressure Ulcer Wound Location: Thoracic spine Wound Status: Open Wounding Event: Gradually Appeared Comorbid History: Hypertension, Type II Diabetes Date Acquired: 12/19/2020 Weeks Of Treatment: 25 Clustered Wound: No Photos Wound Measurements Length: (cm) 1.2 Width: (cm) 1 Depth: (cm) 0.8 Area: (cm) 0.942 Volume: (cm) 0.754 % Reduction in Area: 72.7% % Reduction in Volume: 85.5% Epithelialization: Small (1-33%) Tunneling: Yes Position (o'clock): 12 Maximum Distance: (cm) 4.1 Undermining: No Wound Description Classification: Category/Stage IV Wound Margin: Well defined, not attached Exudate Amount: Medium Exudate Type: Serosanguineous Exudate Color: red, brown Foul Odor After Cleansing: No Slough/Fibrino Yes Wound Bed Granulation Amount: Large (67-100%) Exposed Structure Granulation Quality: Red, Pink Fascia Exposed: No Necrotic Amount: None Present (0%) Fat Layer (Subcutaneous Tissue) Exposed: Yes Tendon Exposed: No Muscle  Exposed: No Joint Exposed: No Bone Exposed: No Treatment Notes Wound #1 (Thoracic spine) Cleanser Wound Cleanser Discharge Instruction: Cleanse the wound with wound cleanser prior to applying a clean dressing using gauze sponges, not tissue or cotton balls. Peri-Wound Care Skin Prep Discharge Instruction: Use skin prep as directed Topical Primary Dressing Promogran Prisma Matrix, 4.34 (sq in) (silver collagen) Discharge Instruction: Moisten collagen with saline or hydrogel VAC Secondary Dressing Secured With Compression Wrap Compression Stockings Add-Ons Electronic Signature(s) Signed: 06/12/2022 4:56:45 PM By: Sharyn Creamer RN, BSN Entered By: Sharyn Creamer on 06/12/2022 14:45:37 -------------------------------------------------------------------------------- [place] Details Patient Name: Date of Service: [name], McNeil. 06/12/2022 2:00 PM Medical Record Number: [id number] Patient Account Number: [id number] Date of Birth/Sex: Treating RN: June 06, 1923 (88 y.o. [name] Primary Care Prabhjot Maddux: [name] Other Clinician: Referring Tate Jerkins: Treating Kohana Amble/Extender: Nicholaus Corolla in Treatment: 25 Vital Signs Time Taken: 14:35 Temperature (F): 97.9 Height (in): 62 Pulse (bpm): 99 Weight (lbs): 110 Respiratory Rate (breaths/min): 18 Body Mass Index (BMI): 20.1 Blood Pressure (mmHg): 121/68 Reference Range: 80 - 120 mg / dl Electronic Signature(s) Signed: 06/12/2022 4:56:45 PM By: Sharyn Creamer RN, BSN Entered By: Sharyn Creamer on 06/12/2022 14:39:03

## 2022-06-13 NOTE — Progress Notes (Signed)
[name] ([id number]) Visit Report for 06/12/2022 Chief Complaint Document Details Patient Name: Date of Service: [name], [name] 06/12/2022 2:00 PM Medical Record Number: [id number] Patient Account Number: [id number] Date of Birth/Sex: Treating RN: 1923/05/27 (88 y.o. Elam Dutch Primary Care Provider: [name] Other Clinician: Referring Provider: Treating Provider/Extender: Nicholaus Corolla in Treatment: 25 Information Obtained from: Patient Chief Complaint Patient is at the clinic for treatment of an open pressure ulcer Electronic Signature(s) Signed: 06/12/2022 3:00:06 PM By: Fredirick Maudlin MD FACS Entered By: Fredirick Maudlin on 06/12/2022 15:00:06 -------------------------------------------------------------------------------- HPI Details Patient Name: Date of Service: Stacy Like B. 06/12/2022 2:00 PM Medical Record Number: [id number] Patient Account Number: [id number] Date of Birth/Sex: Treating RN: Dec 30, 1922 (88 y.o. Elam Dutch Primary Care Provider: [name] Other Clinician: Referring Provider: Treating Provider/Extender: Nicholaus Corolla in Treatment: 25 History of Present Illness HPI Description: ADMISSION 12/19/21 This is a 88 year old woman who is presenting to clinic today with a an open ulcer over her thoracic spine. She has severe kyphosis and a history of prior T12 fracture, as well as type 2 diabetes mellitus. The wound has been present for about a year. She is accompanied by her daughters, 1 of whom is a Marine scientist. They have been trying to offload the site by using pillows and supports to keep returned while in bed, and an eggcrate foam with a cut out for the wound for when she is sitting up. They have been applying Santyl to the site. She does have home health and the provider took a culture which was positive for Pseudomonas. She was on ciprofloxacin for this. A recent repeat culture was  negative. Most recent hemoglobin A1c was 6.5, in February. A C-reactive protein also obtained in February was elevated at 24.1. No imaging has been performed of the site. The patient denies significant pain. 12/25/2021: Last week, I took a bone biopsy as well as culture. Fortunately, the bone biopsy was negative for osteomyelitis. The culture returned with rare corynebacterium and rare Enterococcus faecalis. We have been using Santyl and Hydrofera Blue. T oday, the wound appears cleaner and there are buds of granulation tissue forming around the perimeter. 01/01/2022: Due to the positive culture, we switched to topical gentamicin. Today, she has had a little bit of a slough accumulation. The wound continues to have buds of granulation tissue forming. Apparently, the Hydrofera Blue was placed just over the wound opening rather than down in to contact the surface. No purulent drainage or odor. 01/08/2022: Last week, there was more slough in the wound bed, so I changed back to Santyl. There is now hypertrophic granulation tissue around the wound opening. Once again, we seem to be having some difficulty getting the Hydrofera Blue tucked up into the wound; there is undermining that extends for 3 to 4 cm at the 12 o'clock position and the Hydrofera Blue is simply sitting on top of the wound without being in contact with the surface. Bone is still palpable, but tissue is beginning to close in over it. 01/22/2022: The wound looks much cleaner this week and the hypertrophic granulation tissue responded appropriately to silver nitrate application. She continues to have undermining for about 4 cm from 12:00 to 4:00. I can still feel the bone in the base at about the 2 o'clock position, but it is less prominent and tissue is closing over. We have been using Santyl and Hydrofera Blue. The daughter that accompanies the patient today indicates that  they have been very diligent in making sure the [facility] is tucked  under and into the full base of the wound. 02/05/2022: The wound overall is improved. The undermining is about the same but there is less bone palpable. We have been using Santyl and Hydrofera Blue. Minimal slough accumulation in the 2 weeks since her last visit. 02/19/2022: The visible wound surface is very clean without any slough accumulation. Although I can feel bone in the undermined portion of the wound, it is now covered with a layer of tissue. The undermined area is about the same size, however. We have been using Santyl with Hydrofera Blue. She has been approved for snap VAC use. 02/26/2022: Last week, we applied a snap VAC. The wound dimensions have come in by a bit in all directions. The VAC canister did fill, however, and was starting to leak so the patient's daughter removed the VAC and dressed the wound as we had been previously. There is a little bit of thin slough on the wound surface with some senescent tissue at the inferior margin. Bone is still palpable but covered with a layer of tissue. No odor. 03/06/2022: We continue to have technical difficulties with the snap VAC which resulted in the patient's family removing it and going back to the dressing changes with Hydrofera Blue. It sounds like there are issues maintaining suction and the drape is getting bunched up creating a leak. The wound measured larger today, but the intake nurse thinks that perhaps her measurements were taken slightly differently than last week's. There is just some thin slough on the wound surface as well as some hypertrophic granulation tissue on the lateral border. 03/13/2022: The snap VAC worked much better this week and only began leaking yesterday. At that point, the patient's family removed it and applied Hydrofera Blue to her wound. Today, the tissue coverage over the bone that had been exposed is more substantial. She still has undermining present. There is also some senescent skin around the wound orifice  along with some hypertrophic granulation tissue. 03/20/2022: The snap VAC unfortunately failed on Friday. There has really been no significant change to the wound for several weeks. The degree of undermining is unchanged. The surface is clean. 03/27/2022: Once again, the snap VAC failed prematurely and had to be removed on Saturday. The undermining remains the same. There is more tissue coverage of the previously exposed bone. The wound surface has just a light layer of biofilm and thin slough. She was approved for a standard wound VAC, but there are currently no home health agencies able to staff dressing changes for her; it is too difficult for her and her family to bring her to the wound center more than once a week. 04/03/2022: The snap VAC stayed on but the canister filled up on Sunday. The direct depth of the wound is a little bit shallower but the undermining has not really changed. There is some slightly discolored, darker tissue at the 9 o'clock position. No odor or purulent drainage. 04/11/2022: Using 2 canisters worked well and the snap VAC lasted until yesterday. She has her standard wound VAC with her today. The orifice of the wound has contracted somewhat. There is still substantial undermining at 12:00, but the undermined portion to the right between 2 and 4:00 has closed and somewhat. The previously exposed bone is no longer even palpable under the overlying tissue. No concern for infection. 04/17/2022: A standard VAC dressing was initiated last week. The family has been  changing it at home, due to the inability to secure home health services. It is a little bit challenging to apply, due to the small orifice of the wound with substantial undermining. Today, the undermining at 12:00 seems like it might be a little bit deeper, but the 2-4 o'clock undermining is decreased. There is excellent tissue coverage over the previously exposed bone. There is a little bit of slough accumulation at the  orifice, along with some senescent skin. 04/23/2022: The wound VAC was not changed all week due to the family being overwhelmed with other issues. Fortunately, there does not seem to have been any untoward effects from this. The orifice of the wound remains unchanged but the undermining and tunneling has narrowed to just 1 area at 12:00. No purulent drainage or odor. Good tissue coverage of the previously exposed bone. 04/30/2022: The tunneling has narrowed substantially. There is no purulent drainage or odor from the wound. The orifice of the wound is about the same size and there is some hypertrophic granulation tissue at the opening. 05/07/2022: The undermining has come in substantially. The only particularly deep area is from 1-2 o'clock. The rest of the wound is clean without any purulent drainage or odor. 05/14/2022: The orifice of the wound has contracted further. She continues to have just under 5 cm of tunneling at 12:00, but the wound is clean without any purulent drainage or odor. 05/21/2022: The tunnel has come in by about half a centimeter. The wound is clean without any slough. 05/28/2022: The tunnel continues to contract. The wound is clean with good granulation tissue at the base. No concern for infection. 06/04/2022: The tunnel is about the same length this week, but it is quite a bit narrower. The wound is clean with good granulation tissue. Unfortunately, her family applied the track pad directly over the sponge without any intervening drape and without bridging it away from her spine so she has a small ulcer from the track pad just caudal to the wound. 06/12/2022: The small ulcer from the track pad that was present last week has healed. Otherwise the wound is basically unchanged. Electronic Signature(s) Signed: 06/12/2022 3:00:37 PM By: Fredirick Maudlin MD FACS Entered By: Fredirick Maudlin on 06/12/2022  15:00:37 -------------------------------------------------------------------------------- Physical Exam Details Patient Name: Date of Service: Stacy Like B. 06/12/2022 2:00 PM Medical Record Number: [id number] Patient Account Number: [id number] Date of Birth/Sex: Treating RN: [birth year] (88 y.o. Elam Dutch Primary Care Provider: [name] Other Clinician: Referring Provider: Treating Provider/Extender: Nicholaus Corolla in Treatment: 25 Constitutional . . . . No acute distress.Marland Kitchen Respiratory Normal work of breathing on room air.. Notes 06/12/2022: The small ulcer from the track pad that was present last week has healed. Otherwise the wound is basically unchanged. Electronic Signature(s) Signed: 06/12/2022 3:01:10 PM By: Fredirick Maudlin MD FACS Entered By: Fredirick Maudlin on 06/12/2022 15:01:10 -------------------------------------------------------------------------------- Physician Orders Details Patient Name: Date of Service: Stacy Like B. 06/12/2022 2:00 PM Medical Record Number: [id number] Patient Account Number: [id number] Date of Birth/Sex: Treating RN: [birth year] (88 y.o. [name] Primary Care Provider: [name] Other Clinician: Referring Provider: Treating Provider/Extender: Nicholaus Corolla in Treatment: 25 Verbal / Phone Orders: No Diagnosis Coding ICD-10 Coding Code Description L89.104 Pressure ulcer of unspecified part of back, stage 4 E11.622 Type 2 diabetes mellitus with other skin ulcer S22.089S Unspecified fracture of T11-T12 vertebra, sequela Follow-up Appointments ppointment in 1 week. - Dr Celine Ahr - Room 1 - with Vaughan Basta Return  A Monday 9/11 @ 2:00 pm Anesthetic Wound #1 Thoracic spine (In clinic) Topical Lidocaine 5% applied to wound bed Bathing/ Shower/ Hygiene May shower with protection but do not get wound dressing(s) wet. Negative Presssure Wound Therapy Wound Vac to wound  continuously at 112m/hg pressure Black Foam Off-Loading Turn and reposition every 2 hours Other: - KEEP PRESSURE OFF OF BACK MAY USE EGG CRATE CUSHION Additional Orders / Instructions Follow Nutritious Diet Wound Treatment Wound #1 - Thoracic spine Cleanser: Wound Cleanser (Generic) 2 x Per Week/30 Days Discharge Instructions: Cleanse the wound with wound cleanser prior to applying a clean dressing using gauze sponges, not tissue or cotton balls. Peri-Wound Care: Skin Prep 2 x Per Week/30 Days Discharge Instructions: Use skin prep as directed Prim Dressing: Promogran Prisma Matrix, 4.34 (sq in) (silver collagen) 2 x Per Week/30 Days ary Discharge Instructions: Moisten collagen with saline or hydrogel Prim Dressing: VAC ary 2 x Per Week/30 Days Electronic Signature(s) Signed: 06/12/2022 3:22:49 PM By: CFredirick MaudlinMD FACS Entered By: CFredirick Maudlinon 06/12/2022 15:03:32 -------------------------------------------------------------------------------- Problem List Details Patient Name: Date of Service: HJim LikeB. 06/12/2022 2:00 PM Medical Record Number: [id number] Account Number: [id number] of Birth/Sex: Treating RN: 714-Feb-1924(88y.o. FElam DutchPrimary Care Provider: RJanace LittenOther Clinician: Referring Provider: Treating Provider/Extender: CNicholaus Corollain Treatment: 25 Active Problems ICD-10 Encounter Code Description Active Date MDM Diagnosis L89.104 Pressure ulcer of unspecified part of back, stage 4 12/19/2021 No Yes E11.622 Type 2 diabetes mellitus with other skin ulcer 12/19/2021 No Yes S22.089S Unspecified fracture of T11-T12 vertebra, sequela 12/19/2021 No Yes Inactive Problems Resolved Problems Electronic Signature(s) Signed: 06/12/2022 2:59:54 PM By: CFredirick MaudlinMD FACS Entered By: CFredirick Maudlinon 06/12/2022  14:59:54 -------------------------------------------------------------------------------- Progress Note Details Patient Name: Date of Service: HJim LikeB. 06/12/2022 2:00 PM Medical Record Number: [id number] Account Number: [id number] of Birth/Sex: Treating RN: [birth year](88y.o. FElam DutchPrimary Care Provider: RJanace LittenOther Clinician: Referring Provider: Treating Provider/Extender: CNicholaus Corollain Treatment: 25 Subjective Chief Complaint Information obtained from Patient Patient is at the clinic for treatment of an open pressure ulcer History of Present Illness (HPI) ADMISSION 12/19/21 This is a 88year old woman who is presenting to clinic today with a an open ulcer over her thoracic spine. She has severe kyphosis and a history of prior T12 fracture, as well as type 2 diabetes mellitus. The wound has been present for about a year. She is accompanied by her daughters, 1 of whom is a nMarine scientist They have been trying to offload the site by using pillows and supports to keep returned while in bed, and an eggcrate foam with a cut out for the wound for when she is sitting up. They have been applying Santyl to the site. She does have home health and the provider took a culture which was positive for Pseudomonas. She was on ciprofloxacin for this. A recent repeat culture was negative. Most recent hemoglobin A1c was 6.5, in February. A C-reactive protein also obtained in February was elevated at 24.1. No imaging has been performed of the site. The patient denies significant pain. 12/25/2021: Last week, I took a bone biopsy as well as culture. Fortunately, the bone biopsy was negative for osteomyelitis. The culture returned with rare corynebacterium and rare Enterococcus faecalis. We have been using Santyl and Hydrofera Blue. T oday, the wound appears cleaner and there are buds of granulation tissue forming around the perimeter.  01/01/2022:  Due to the positive culture, we switched to topical gentamicin. Today, she has had a little bit of a slough accumulation. The wound continues to have buds of granulation tissue forming. Apparently, the Hydrofera Blue was placed just over the wound opening rather than down in to contact the surface. No purulent drainage or odor. 01/08/2022: Last week, there was more slough in the wound bed, so I changed back to Santyl. There is now hypertrophic granulation tissue around the wound opening. Once again, we seem to be having some difficulty getting the Hydrofera Blue tucked up into the wound; there is undermining that extends for 3 to 4 cm at the 12 o'clock position and the Hydrofera Blue is simply sitting on top of the wound without being in contact with the surface. Bone is still palpable, but tissue is beginning to close in over it. 01/22/2022: The wound looks much cleaner this week and the hypertrophic granulation tissue responded appropriately to silver nitrate application. She continues to have undermining for about 4 cm from 12:00 to 4:00. I can still feel the bone in the base at about the 2 o'clock position, but it is less prominent and tissue is closing over. We have been using Santyl and Hydrofera Blue. The daughter that accompanies the patient today indicates that they have been very diligent in making sure the [facility] is tucked under and into the full base of the wound. 02/05/2022: The wound overall is improved. The undermining is about the same but there is less bone palpable. We have been using Santyl and Hydrofera Blue. Minimal slough accumulation in the 2 weeks since her last visit. 02/19/2022: The visible wound surface is very clean without any slough accumulation. Although I can feel bone in the undermined portion of the wound, it is now covered with a layer of tissue. The undermined area is about the same size, however. We have been using Santyl with Hydrofera Blue. She has  been approved for snap VAC use. 02/26/2022: Last week, we applied a snap VAC. The wound dimensions have come in by a bit in all directions. The VAC canister did fill, however, and was starting to leak so the patient's daughter removed the VAC and dressed the wound as we had been previously. There is a little bit of thin slough on the wound surface with some senescent tissue at the inferior margin. Bone is still palpable but covered with a layer of tissue. No odor. 03/06/2022: We continue to have technical difficulties with the snap VAC which resulted in the patient's family removing it and going back to the dressing changes with Hydrofera Blue. It sounds like there are issues maintaining suction and the drape is getting bunched up creating a leak. The wound measured larger today, but the intake nurse thinks that perhaps her measurements were taken slightly differently than last week's. There is just some thin slough on the wound surface as well as some hypertrophic granulation tissue on the lateral border. 03/13/2022: The snap VAC worked much better this week and only began leaking yesterday. At that point, the patient's family removed it and applied Hydrofera Blue to her wound. Today, the tissue coverage over the bone that had been exposed is more substantial. She still has undermining present. There is also some senescent skin around the wound orifice along with some hypertrophic granulation tissue. 03/20/2022: The snap VAC unfortunately failed on Friday. There has really been no significant change to the wound for several weeks. The degree of undermining is  unchanged. The surface is clean. 03/27/2022: Once again, the snap VAC failed prematurely and had to be removed on Saturday. The undermining remains the same. There is more tissue coverage of the previously exposed bone. The wound surface has just a light layer of biofilm and thin slough. She was approved for a standard wound VAC, but there are  currently no home health agencies able to staff dressing changes for her; it is too difficult for her and her family to bring her to the wound center more than once a week. 04/03/2022: The snap VAC stayed on but the canister filled up on Sunday. The direct depth of the wound is a little bit shallower but the undermining has not really changed. There is some slightly discolored, darker tissue at the 9 o'clock position. No odor or purulent drainage. 04/11/2022: Using 2 canisters worked well and the snap VAC lasted until yesterday. She has her standard wound VAC with her today. The orifice of the wound has contracted somewhat. There is still substantial undermining at 12:00, but the undermined portion to the right between 2 and 4:00 has closed and somewhat. The previously exposed bone is no longer even palpable under the overlying tissue. No concern for infection. 04/17/2022: A standard VAC dressing was initiated last week. The family has been changing it at home, due to the inability to secure home health services. It is a little bit challenging to apply, due to the small orifice of the wound with substantial undermining. Today, the undermining at 12:00 seems like it might be a little bit deeper, but the 2-4 o'clock undermining is decreased. There is excellent tissue coverage over the previously exposed bone. There is a little bit of slough accumulation at the orifice, along with some senescent skin. 04/23/2022: The wound VAC was not changed all week due to the family being overwhelmed with other issues. Fortunately, there does not seem to have been any untoward effects from this. The orifice of the wound remains unchanged but the undermining and tunneling has narrowed to just 1 area at 12:00. No purulent drainage or odor. Good tissue coverage of the previously exposed bone. 04/30/2022: The tunneling has narrowed substantially. There is no purulent drainage or odor from the wound. The orifice of the wound is  about the same size and there is some hypertrophic granulation tissue at the opening. 05/07/2022: The undermining has come in substantially. The only particularly deep area is from 1-2 o'clock. The rest of the wound is clean without any purulent drainage or odor. 05/14/2022: The orifice of the wound has contracted further. She continues to have just under 5 cm of tunneling at 12:00, but the wound is clean without any purulent drainage or odor. 05/21/2022: The tunnel has come in by about half a centimeter. The wound is clean without any slough. 05/28/2022: The tunnel continues to contract. The wound is clean with good granulation tissue at the base. No concern for infection. 06/04/2022: The tunnel is about the same length this week, but it is quite a bit narrower. The wound is clean with good granulation tissue. Unfortunately, her family applied the track pad directly over the sponge without any intervening drape and without bridging it away from her spine so she has a small ulcer from the track pad just caudal to the wound. 06/12/2022: The small ulcer from the track pad that was present last week has healed. Otherwise the wound is basically unchanged. Patient History Family History Unknown History. Social History Never smoker, Marital Status -  Married, Alcohol Use - Never, Drug Use - No History, Caffeine Use - Rarely. Medical History Ear/Nose/Mouth/Throat Denies history of Chronic sinus problems/congestion, Middle ear problems Cardiovascular Patient has history of Hypertension Endocrine Patient has history of Type II Diabetes Immunological Denies history of Lupus Erythematosus, Raynaudoos, Scleroderma Oncologic Denies history of Received Chemotherapy, Received Radiation Hospitalization/Surgery History - t/11 t/12 FRACTURE/SURGERY. - FEMUR FX SURGERY. - TOTAL HYSTERECTOMY. - CHOLECYSTECTOMY. - TONSILLECTOMY. Medical A Surgical History Notes nd Ear/Nose/Mouth/Throat PARTIAL HEARING LEFT HEAR  W/ HEARING AID TOTAL DEAFNESS RIGHT EAR Gastrointestinal Melena Endocrine Hypothyroidism Hyperlipidemia associated with type 2 diabetes mellitus (HCC) Musculoskeletal Closed T12 fracture (HCC) Hip fracture (HCC) Closed comminuted intertrochanteric fracture of proximal end of right femur (HCC) Objective Constitutional No acute distress.. Vitals Time Taken: 2:35 PM, Height: 62 in, Weight: 110 lbs, BMI: 20.1, Temperature: 97.9 F, Pulse: 99 bpm, Respiratory Rate: 18 breaths/min, Blood Pressure: 121/68 mmHg. Respiratory Normal work of breathing on room air.. General Notes: 06/12/2022: The small ulcer from the track pad that was present last week has healed. Otherwise the wound is basically unchanged. Integumentary (Hair, Skin) Wound #1 status is Open. Original cause of wound was Gradually Appeared. The date acquired was: 12/19/2020. The wound has been in treatment 25 weeks. The wound is located on the Thoracic spine. The wound measures 1.2cm length x 1cm width x 0.8cm depth; 0.942cm^2 area and 0.754cm^3 volume. There is Fat Layer (Subcutaneous Tissue) exposed. There is no undermining noted, however, there is tunneling at 12:00 with a maximum distance of 4.1cm. There is a medium amount of serosanguineous drainage noted. The wound margin is well defined and not attached to the wound base. There is large (67-100%) red, pink granulation within the wound bed. There is no necrotic tissue within the wound bed. Assessment Active Problems ICD-10 Pressure ulcer of unspecified part of back, stage 4 Type 2 diabetes mellitus with other skin ulcer Unspecified fracture of T11-T12 vertebra, sequela Plan Follow-up Appointments: Return Appointment in 1 week. - Dr Celine Ahr - Room 1 - [facility] Monday 9/11 @ 2:00 pm Anesthetic: Wound #1 Thoracic spine: (In clinic) Topical Lidocaine 5% applied to wound bed Bathing/ Shower/ Hygiene: May shower with protection but do not get wound dressing(s) wet. Negative  Presssure Wound Therapy: Wound Vac to wound continuously at 156m/hg pressure Black Foam Off-Loading: Turn and reposition every 2 hours Other: - KEEP PRESSURE OFF OF BACK MAY USE EGG CRATE CUSHION Additional Orders / Instructions: Follow Nutritious Diet WOUND #1: - Thoracic spine Wound Laterality: Cleanser: Wound Cleanser (Generic) 2 x Per Week/30 Days Discharge Instructions: Cleanse the wound with wound cleanser prior to applying a clean dressing using gauze sponges, not tissue or cotton balls. Peri-Wound Care: Skin Prep 2 x Per Week/30 Days Discharge Instructions: Use skin prep as directed Prim Dressing: Promogran Prisma Matrix, 4.34 (sq in) (silver collagen) 2 x Per Week/30 Days ary Discharge Instructions: Moisten collagen with saline or hydrogel Prim Dressing: VAC 2 x Per Week/30 Days ary 06/12/2022: The small ulcer from the track pad that was present last week has healed. Otherwise the wound is basically unchanged. No debridement was necessary today. The nursing staff worked with the patient's family to help ensure that the undermined portion of the wound is adequately filled with sponge. Continue negative pressure wound therapy. Follow-up in 1 week. Electronic Signature(s) Signed: 06/12/2022 3:15:19 PM By: CFredirick MaudlinMD FACS Previous Signature: 06/12/2022 3:04:23 PM Version By: CFredirick MaudlinMD FACS Entered By: CFredirick Maudlinon 06/12/2022 15:15:18 -------------------------------------------------------------------------------- HxROS Details Patient  Name: Date of Service: [name], [name] 06/12/2022 2:00 PM Medical Record Number: [id number] Patient Account Number: [id number] Date of Birth/Sex: Treating RN: August 12, 1923 (88 y.o. Elam Dutch Primary Care Provider: [name] Other Clinician: Referring Provider: Treating Provider/Extender: Nicholaus Corolla in Treatment: 25 Ear/Nose/Mouth/Throat Medical History: Negative for: Chronic sinus  problems/congestion; Middle ear problems Past Medical History Notes: PARTIAL HEARING LEFT HEAR W/ HEARING AID TOTAL DEAFNESS RIGHT EAR Cardiovascular Medical History: Positive for: Hypertension Gastrointestinal Medical History: Past Medical History Notes: Melena Endocrine Medical History: Positive for: Type II Diabetes Past Medical History Notes: Hypothyroidism Hyperlipidemia associated with type 2 diabetes mellitus ([place]) Treated with: Diet Immunological Medical History: Negative for: Lupus Erythematosus; Raynauds; Scleroderma Musculoskeletal Medical History: Past Medical History Notes: Closed T12 fracture ([place]) Hip fracture ([place]) Closed comminuted intertrochanteric fracture of proximal end of right femur [facility]) Oncologic Medical History: Negative for: Received Chemotherapy; Received Radiation Immunizations Pneumococcal Vaccine: Received Pneumococcal Vaccination: Yes Received Pneumococcal Vaccination On or After 60th Birthday: No Implantable Devices None Hospitalization / Surgery History Type of Hospitalization/Surgery t/11 t/12 FRACTURE/SURGERY FEMUR FX SURGERY TOTAL HYSTERECTOMY CHOLECYSTECTOMY TONSILLECTOMY Family and Social History Unknown History: Yes; Never smoker; Marital Status - Married; Alcohol Use: Never; Drug Use: No History; Caffeine Use: Rarely; Financial Concerns: No; Food, Clothing or Shelter Needs: No; Support System Lacking: No; Transportation Concerns: No Engineer, maintenance) Signed: 06/12/2022 3:22:49 PM By: Fredirick Maudlin MD FACS Signed: 06/13/2022 5:54:13 PM By: Baruch Gouty RN, BSN Entered By: Fredirick Maudlin on 06/12/2022 15:00:43 -------------------------------------------------------------------------------- [place] Details Patient Name: Date of Service: [name] 06/12/2022 Medical Record Number: [id number] Patient Account Number: [id number] Date of Birth/Sex: Treating RN: 04/25/1923 (88 y.o. Elam Dutch Primary  Care Provider: [name] Other Clinician: Referring Provider: Treating Provider/Extender: Nicholaus Corolla in Treatment: 25 Diagnosis Coding ICD-10 Codes Code Description L89.104 Pressure ulcer of unspecified part of back, stage 4 E11.622 Type 2 diabetes mellitus with other skin ulcer S22.089S Unspecified fracture of T11-T12 vertebra, sequela Facility Procedures CPT4 Code: 57322025 Description: 42706 - WOUND VAC-50 SQ CM OR LESS Modifier: Quantity: 1 Physician Procedures : CPT4 Code Description Modifier [id number] - WC PHYS LEVEL 4 - EST PT ICD-10 Diagnosis Description L89.104 Pressure ulcer of unspecified part of back, stage 4 S22.089S Unspecified fracture of T11-T12 vertebra, sequela E11.622 Type 2 diabetes mellitus  with other skin ulcer Quantity: 1 Electronic Signature(s) Signed: 06/12/2022 3:15:33 PM By: Fredirick Maudlin MD FACS Entered By: Fredirick Maudlin on 06/12/2022 15:15:33

## 2022-06-18 ENCOUNTER — Encounter (HOSPITAL_BASED_OUTPATIENT_CLINIC_OR_DEPARTMENT_OTHER): Payer: Medicare Other | Admitting: General Surgery

## 2022-06-18 DIAGNOSIS — L89104 Pressure ulcer of unspecified part of back, stage 4: Secondary | ICD-10-CM | POA: Diagnosis not present

## 2022-06-18 NOTE — Progress Notes (Signed)
Stacy, Perry ([id number]) Visit Report for 06/18/2022 Arrival Information Details Patient Name: Date of Service: [name], LOBOSCO 06/18/2022 2:00 PM Medical Record Number: [id number] Patient Account Number: [id number] Date of Birth/Sex: Treating RN: 1923/06/09 (88 y.o. Elam Dutch Primary Care Brandley Aldrete: [name] Other Clinician: Referring Kilie Rund: Treating Jasimine Simms/Extender: Nicholaus Corolla in Treatment: 25 Visit Information History Since Last Visit Added or deleted any medications: Yes Patient Arrived: Wheel Chair Any new allergies or adverse reactions: No Arrival Time: 14:16 Had a fall or experienced change in No Accompanied By: daughter activities of daily living that may affect Transfer Assistance: None risk of falls: Patient Identification Verified: Yes Signs or symptoms of abuse/neglect since last visito No Secondary Verification Process Completed: Yes Hospitalized since last visit: No Patient Requires Transmission-Based Precautions: No Implantable device outside of the clinic excluding No Patient Has Alerts: No cellular tissue based products placed in the center since last visit: Has Dressing in Place as Prescribed: Yes Pain Present Now: Yes Notes finishing antibiotic for UTI Electronic Signature(s) Signed: 06/18/2022 4:51:35 PM By: Baruch Gouty RN, BSN Entered By: Baruch Gouty on 06/18/2022 14:27:31 -------------------------------------------------------------------------------- Clinic Level of Care Assessment Details Patient Name: Date of Service: [name], [name] 06/18/2022 2:00 PM Medical Record Number: [id number] Patient Account Number: [id number] Date of Birth/Sex: Treating RN: January 19, 1923 (88 y.o. Elam Dutch Primary Care Maxine Fredman: [name] Other Clinician: Referring Clydie Dillen: Treating Gevork Ayyad/Extender: Nicholaus Corolla in Treatment: 25 Clinic Level of Care Assessment  Items TOOL 4 Quantity Score []  - 0 Use when only an EandM is performed on FOLLOW-UP visit ASSESSMENTS - Nursing Assessment / Reassessment X- 1 10 Reassessment of Co-morbidities (includes updates in patient status) X- 1 5 Reassessment of Adherence to Treatment Plan ASSESSMENTS - Wound and Skin A ssessment / Reassessment X - Simple Wound Assessment / Reassessment - one wound 1 5 []  - 0 Complex Wound Assessment / Reassessment - multiple wounds []  - 0 Dermatologic / Skin Assessment (not related to wound area) ASSESSMENTS - Focused Assessment []  - 0 Circumferential Edema Measurements - multi extremities []  - 0 Nutritional Assessment / Counseling / Intervention []  - 0 Lower Extremity Assessment (monofilament, tuning fork, pulses) []  - 0 Peripheral Arterial Disease Assessment (using hand held doppler) ASSESSMENTS - Ostomy and/or Continence Assessment and Care []  - 0 Incontinence Assessment and Management []  - 0 Ostomy Care Assessment and Management (repouching, etc.) PROCESS - Coordination of Care X - Simple Patient / Family Education for ongoing care 1 15 []  - 0 Complex (extensive) Patient / Family Education for ongoing care X- 1 10 Staff obtains Programmer, systems, Records, T Results / Process Orders est []  - 0 Staff telephones HHA, Nursing Homes / Clarify orders / etc []  - 0 Routine Transfer to another Facility (non-emergent condition) []  - 0 Routine Hospital Admission (non-emergent condition) []  - 0 New Admissions / Biomedical engineer / Ordering NPWT Apligraf, etc. , []  - 0 Emergency Hospital Admission (emergent condition) X- 1 10 Simple Discharge Coordination []  - 0 Complex (extensive) Discharge Coordination PROCESS - Special Needs []  - 0 Pediatric / Minor Patient Management []  - 0 Isolation Patient Management []  - 0 Hearing / Language / Visual special needs []  - 0 Assessment of Community assistance (transportation, D/C planning, etc.) []  - 0 Additional  assistance / Altered mentation []  - 0 Support Surface(s) Assessment (bed, cushion, seat, etc.) INTERVENTIONS - Wound Cleansing / Measurement X - Simple Wound Cleansing - one wound 1 5 []  - 0  Complex Wound Cleansing - multiple wounds X- 1 5 Wound Imaging (photographs - any number of wounds) []  - 0 Wound Tracing (instead of photographs) X- 1 5 Simple Wound Measurement - one wound []  - 0 Complex Wound Measurement - multiple wounds INTERVENTIONS - Wound Dressings X - Small Wound Dressing one or multiple wounds 1 10 []  - 0 Medium Wound Dressing one or multiple wounds []  - 0 Large Wound Dressing one or multiple wounds []  - 0 Application of Medications - topical []  - 0 Application of Medications - injection INTERVENTIONS - Miscellaneous []  - 0 External ear exam []  - 0 Specimen Collection (cultures, biopsies, blood, body fluids, etc.) []  - 0 Specimen(s) / Culture(s) sent or taken to Lab for analysis []  - 0 Patient Transfer (multiple staff / Civil Service fast streamer / Similar devices) []  - 0 Simple Staple / Suture removal (25 or less) []  - 0 Complex Staple / Suture removal (26 or more) []  - 0 Hypo / Hyperglycemic Management (close monitor of Blood Glucose) []  - 0 Ankle / Brachial Index (ABI) - do not check if billed separately X- 1 5 Vital Signs Has the patient been seen at the hospital within the last three years: Yes Total Score: 85 Level Of Care: New/Established - Level 3 Electronic Signature(s) Signed: 06/18/2022 4:51:35 PM By: Baruch Gouty RN, BSN Entered By: Baruch Gouty on 06/18/2022 14:49:20 -------------------------------------------------------------------------------- Encounter Discharge Information Details Patient Name: Date of Service: Stacy Server, [name] 06/18/2022 2:00 PM Medical Record Number: [id number] Patient Account Number: [id number] Date of Birth/Sex: Treating RN: 1923-02-02 (88 y.o. Elam Dutch Primary Care Kannon Granderson: [name] Other  Clinician: Referring Sheily Lineman: Treating Katharin Schneider/Extender: Nicholaus Corolla in Treatment: 25 Encounter Discharge Information Items Discharge Condition: Stable Ambulatory Status: Wheelchair Discharge Destination: Home Transportation: Private Auto Accompanied By: daughter Schedule Follow-up Appointment: Yes Clinical Summary of Care: Patient Declined Electronic Signature(s) Signed: 06/18/2022 4:51:35 PM By: Baruch Gouty RN, BSN Entered By: Baruch Gouty on 06/18/2022 15:02:25 -------------------------------------------------------------------------------- Lower Extremity Assessment Details Patient Name: Date of Service: Stacy DANIELA, SIEBERS B. 06/18/2022 2:00 PM Medical Record Number: [id number] Patient Account Number: [id number] Date of Birth/Sex: Treating RN: Sep 06, 1923 (88 y.o. Elam Dutch Primary Care Delmas Faucett: [name] Other Clinician: Referring Earlyne Feeser: Treating Etoy Mcdonnell/Extender: Nicholaus Corolla in Treatment: 25 Electronic Signature(s) Signed: 06/18/2022 4:51:35 PM By: Baruch Gouty RN, BSN Entered By: Baruch Gouty on 06/18/2022 14:28:45 -------------------------------------------------------------------------------- Multi Wound Chart Details Patient Name: Date of Service: Stacy Server, [place] B. 06/18/2022 2:00 PM Medical Record Number: [id number] Patient Account Number: [id number] Date of Birth/Sex: Treating RN: July 13, 1923 (88 y.o. F) Primary Care Rolanda Campa: [name] Other Clinician: Referring Imani Fiebelkorn: Treating Abas Leicht/Extender: Nicholaus Corolla in Treatment: 25 Vital Signs Height(in): 62 Pulse(bpm): 64 Weight(lbs): 110 Blood Pressure(mmHg): 136/76 Body Mass Index(BMI): 20.1 Temperature(F): 98.6 Respiratory Rate(breaths/min): 18 Photos: [N/A:N/A] Thoracic spine N/A N/A Wound Location: Gradually Appeared N/A N/A Wounding Event: Pressure Ulcer N/A N/A Primary  Etiology: Hypertension, Type II Diabetes N/A N/A Comorbid History: 12/19/2020 N/A N/A Date Acquired: 25 N/A N/A Weeks of Treatment: Open N/A N/A Wound Status: No N/A N/A Wound Recurrence: 1.1x0.9x0.7 N/A N/A Measurements L x W x D (cm) 0.778 N/A N/A A (cm) : rea 0.544 N/A N/A Volume (cm) : 77.50% N/A N/A % Reduction in A rea: 89.50% N/A N/A % Reduction in Volume: 4 Position 1 (o'clock): 4.7 Maximum Distance 1 (cm): 9 Starting Position 1 (o'clock): 5 Ending Position 1 (o'clock): 4.7 Maximum Distance 1 (cm): Yes N/A N/A  Tunneling: Yes N/A N/A Undermining: Category/Stage IV N/A N/A Classification: Medium N/A N/A Exudate A mount: Serosanguineous N/A N/A Exudate Type: red, brown N/A N/A Exudate Color: Well defined, not attached N/A N/A Wound Margin: Large (67-100%) N/A N/A Granulation A mount: Red, Pink N/A N/A Granulation Quality: Small (1-33%) N/A N/A Necrotic A mount: Fat Layer (Subcutaneous Tissue): Yes N/A N/A Exposed Structures: Fascia: No Tendon: No Muscle: No Joint: No Bone: No Small (1-33%) N/A N/A Epithelialization: Treatment Notes Electronic Signature(s) Signed: 06/18/2022 2:50:24 PM By: Fredirick Maudlin MD FACS Entered By: Fredirick Maudlin on 06/18/2022 14:50:24 -------------------------------------------------------------------------------- Multi-Disciplinary Care Plan Details Patient Name: Date of Service: Stacy Server, [place] B. 06/18/2022 2:00 PM Medical Record Number: [id number] Patient Account Number: [id number] Date of Birth/Sex: Treating RN: 04-Nov-1922 (88 y.o. Elam Dutch Primary Care Anie Juniel: [name] Other Clinician: Referring Dylan Monforte: Treating Nygel Prokop/Extender: Nicholaus Corolla in Treatment: 25 Multidisciplinary Care Plan reviewed with physician Active Inactive Pressure Nursing Diagnoses: Knowledge deficit related to causes and risk factors for pressure ulcer development Knowledge  deficit related to management of pressures ulcers Potential for impaired tissue integrity related to pressure, friction, moisture, and shear Goals: Patient will remain free of pressure ulcers Date Initiated: 12/25/2021 Date Inactivated: 04/11/2022 Target Resolution Date: 04/06/2022 Goal Status: Met Patient/caregiver will verbalize understanding of pressure ulcer management Date Initiated: 12/25/2021 Target Resolution Date: 07/05/2022 Goal Status: Active Interventions: Assess: immobility, friction, shearing, incontinence upon admission and as needed Assess offloading mechanisms upon admission and as needed Notes: Wound/Skin Impairment Nursing Diagnoses: Impaired tissue integrity Knowledge deficit related to ulceration/compromised skin integrity Goals: Patient will have a decrease in wound volume by X% from date: (specify in notes) Date Initiated: 12/20/2021 Date Inactivated: 01/08/2022 Target Resolution Date: 01/12/2022 Goal Status: Unmet Unmet Reason: pressure relief Patient/caregiver will verbalize understanding of skin care regimen Date Initiated: 12/20/2021 Target Resolution Date: 07/05/2022 Goal Status: Active Ulcer/skin breakdown will have a volume reduction of 30% by week 4 Date Initiated: 12/20/2021 Date Inactivated: 04/11/2022 Target Resolution Date: 04/06/2022 Goal Status: Unmet Unmet Reason: infection, osteo Ulcer/skin breakdown will have a volume reduction of 50% by week 8 Date Initiated: 12/20/2021 Date Inactivated: 04/11/2022 Target Resolution Date: 04/06/2022 Unmet Reason: osteo, severe Goal Status: Unmet kyphosis Interventions: Assess patient/caregiver ability to obtain necessary supplies Assess patient/caregiver ability to perform ulcer/skin care regimen upon admission and as needed Assess ulceration(s) every visit Notes: Electronic Signature(s) Signed: 06/18/2022 4:51:35 PM By: Baruch Gouty RN, BSN Entered By: Baruch Gouty on 06/18/2022  14:40:42 -------------------------------------------------------------------------------- Pain Assessment Details Patient Name: Date of Service: [name] 06/18/2022 2:00 PM Medical Record Number: [id number] Patient Account Number: [id number] Date of Birth/Sex: Treating RN: [birth year] (88 y.o. Elam Dutch Primary Care Calvary Difranco: [name] Other Clinician: Referring Mosiah Bastin: Treating Geordan Xu/Extender: Nicholaus Corolla in Treatment: 25 Active Problems Location of Pain Severity and Description of Pain Patient Has Paino Yes Site Locations Pain Location: Pain in Ulcers With Dressing Change: Yes Duration of the Pain. Constant / Intermittento Intermittent Rate the pain. Current Pain Level: 2 Worst Pain Level: 4 Least Pain Level: 0 Character of Pain Describe the Pain: Tender Pain Management and Medication Current Pain Management: Is the Current Pain Management Adequate: Adequate How does your wound impact your activities of daily livingo Sleep: No Bathing: No Appetite: No Relationship With Others: No Bladder Continence: No Emotions: No Bowel Continence: No Hobbies: No Toileting: No Dressing: No Electronic Signature(s) Signed: 06/18/2022 4:51:35 PM By: Baruch Gouty RN, BSN Entered By: Baruch Gouty on 06/18/2022  14:28:38 -------------------------------------------------------------------------------- Patient/Caregiver Education Details Patient Name: Date of Service: [name], [name] 9/11/2023andnbsp2:00 PM Medical Record Number: [id number] Patient Account Number: [id number] Date of Birth/Gender: Treating RN: Aug 28, 1923 (88 y.o. Elam Dutch Primary Care Physician: [name] Other Clinician: Referring Physician: Treating Physician/Extender: Nicholaus Corolla in Treatment: 25 Education Assessment Education Provided To: Patient Education Topics Provided Pressure: Methods:  Explain/Verbal Responses: Reinforcements needed, State content correctly Wound/Skin Impairment: Methods: Explain/Verbal Responses: Reinforcements needed, State content correctly Electronic Signature(s) Signed: 06/18/2022 4:51:35 PM By: Baruch Gouty RN, BSN Entered By: Baruch Gouty on 06/18/2022 14:41:08 -------------------------------------------------------------------------------- Wound Assessment Details Patient Name: Date of Service: [name] 06/18/2022 2:00 PM Medical Record Number: [id number] Patient Account Number: [id number] Date of Birth/Sex: Treating RN: Aug 07, 1923 (88 y.o. Elam Dutch Primary Care Modene Andy: [name] Other Clinician: Referring Isao Seltzer: Treating Nakiyah Beverley/Extender: Nicholaus Corolla in Treatment: 25 Wound Status Wound Number: 1 Primary Etiology: Pressure Ulcer Wound Location: Thoracic spine Wound Status: Open Wounding Event: Gradually Appeared Comorbid History: Hypertension, Type II Diabetes Date Acquired: 12/19/2020 Weeks Of Treatment: 25 Clustered Wound: No Photos Wound Measurements Length: (cm) 1.1 Width: (cm) 0.9 Depth: (cm) 0.7 Area: (cm) 0.778 Volume: (cm) 0.544 % Reduction in Area: 77.5% % Reduction in Volume: 89.5% Epithelialization: Small (1-33%) Tunneling: Yes Position (o'clock): 4 Maximum Distance: (cm) 4.7 Undermining: Yes Starting Position (o'clock): 9 Ending Position (o'clock): 5 Maximum Distance: (cm) 4.7 Wound Description Classification: Category/Stage IV Wound Margin: Well defined, not attached Exudate Amount: Medium Exudate Type: Serosanguineous Exudate Color: red, brown Wound Bed Granulation Amount: Large (67-100%) Granulation Quality: Red, Pink Necrotic Amount: Small (1-33%) Necrotic Quality: Adherent Slough Foul Odor After Cleansing: No Slough/Fibrino Yes Exposed Structure Fascia Exposed: No Fat Layer (Subcutaneous Tissue) Exposed: Yes Tendon Exposed:  No Muscle Exposed: No Joint Exposed: No Bone Exposed: No Treatment Notes Wound #1 (Thoracic spine) Cleanser Wound Cleanser Discharge Instruction: Cleanse the wound with wound cleanser prior to applying a clean dressing using gauze sponges, not tissue or cotton balls. Peri-Wound Care Skin Prep Discharge Instruction: Use skin prep as directed Topical Primary Dressing Iodoform packing strip 1/2 (in) Discharge Instruction: Lightly pack as instructed Secondary Dressing Zetuvit Plus Silicone Border Dressing 4x4 (in/in) Discharge Instruction: Apply silicone border over primary dressing as directed. Secured With Compression Wrap Compression Stockings Environmental education officer) Signed: 06/18/2022 4:51:35 PM By: Baruch Gouty RN, BSN Entered By: Baruch Gouty on 06/18/2022 14:37:13 -------------------------------------------------------------------------------- Vitals Details Patient Name: Date of Service: Stacy Server, [place]. 06/18/2022 2:00 PM Medical Record Number: [id number] Patient Account Number: [id number] Date of Birth/Sex: Treating RN: Jan 19, 1923 (88 y.o. Elam Dutch Primary Care Zachry Hopfensperger: [name] Other Clinician: Referring Nathalee Smarr: Treating Asahd Can/Extender: Nicholaus Corolla in Treatment: 25 Vital Signs Time Taken: 14:27 Temperature (F): 98.6 Height (in): 62 Pulse (bpm): 82 Weight (lbs): 110 Respiratory Rate (breaths/min): 18 Body Mass Index (BMI): 20.1 Blood Pressure (mmHg): 136/76 Reference Range: 80 - 120 mg / dl Electronic Signature(s) Signed: 06/18/2022 4:51:35 PM By: Baruch Gouty RN, BSN Entered By: Baruch Gouty on 06/18/2022 14:28:00

## 2022-06-18 NOTE — Progress Notes (Signed)
[name], STRENG ([id number]) Visit Report for 06/18/2022 Chief Complaint Document Details Patient Name: Date of Service: Stacy, Perry 06/18/2022 2:00 PM Medical Record Number: [id number] Patient Account Number: [id number] Date of Birth/Sex: Treating RN: Nov 19, 1922 (88 y.o. F) Primary Care Provider: [name] Other Clinician: Referring Provider: Treating Provider/Extender: Nicholaus Corolla in Treatment: 25 Information Obtained from: Patient Chief Complaint Patient is at the clinic for treatment of an open pressure ulcer Electronic Signature(s) Signed: 06/18/2022 2:51:11 PM By: Fredirick Maudlin MD FACS Entered By: Fredirick Maudlin on 06/18/2022 14:51:11 -------------------------------------------------------------------------------- HPI Details Patient Name: Date of Service: Stacy Like B. 06/18/2022 2:00 PM Medical Record Number: [id number] Patient Account Number: [id number] Date of Birth/Sex: Treating RN: Feb 12, 1923 (88 y.o. F) Primary Care Provider: [name] Other Clinician: Referring Provider: Treating Provider/Extender: Nicholaus Corolla in Treatment: 25 History of Present Illness HPI Description: ADMISSION 12/19/21 This is a 88 year old woman who is presenting to clinic today with a an open ulcer over her thoracic spine. She has severe kyphosis and a history of prior T12 fracture, as well as type 2 diabetes mellitus. The wound has been present for about a year. She is accompanied by her daughters, 1 of whom is a Marine scientist. They have been trying to offload the site by using pillows and supports to keep returned while in bed, and an eggcrate foam with a cut out for the wound for when she is sitting up. They have been applying Santyl to the site. She does have home health and the provider took a culture which was positive for Pseudomonas. She was on ciprofloxacin for this. A recent repeat culture was negative. Most recent  hemoglobin A1c was 6.5, in February. A C-reactive protein also obtained in February was elevated at 24.1. No imaging has been performed of the site. The patient denies significant pain. 12/25/2021: Last week, I took a bone biopsy as well as culture. Fortunately, the bone biopsy was negative for osteomyelitis. The culture returned with rare corynebacterium and rare Enterococcus faecalis. We have been using Santyl and Hydrofera Blue. T oday, the wound appears cleaner and there are buds of granulation tissue forming around the perimeter. 01/01/2022: Due to the positive culture, we switched to topical gentamicin. Today, she has had a little bit of a slough accumulation. The wound continues to have buds of granulation tissue forming. Apparently, the Hydrofera Blue was placed just over the wound opening rather than down in to contact the surface. No purulent drainage or odor. 01/08/2022: Last week, there was more slough in the wound bed, so I changed back to Santyl. There is now hypertrophic granulation tissue around the wound opening. Once again, we seem to be having some difficulty getting the Hydrofera Blue tucked up into the wound; there is undermining that extends for 3 to 4 cm at the 12 o'clock position and the Hydrofera Blue is simply sitting on top of the wound without being in contact with the surface. Bone is still palpable, but tissue is beginning to close in over it. 01/22/2022: The wound looks much cleaner this week and the hypertrophic granulation tissue responded appropriately to silver nitrate application. She continues to have undermining for about 4 cm from 12:00 to 4:00. I can still feel the bone in the base at about the 2 o'clock position, but it is less prominent and tissue is closing over. We have been using Santyl and Hydrofera Blue. The daughter that accompanies the patient today indicates that they have been very  diligent in making sure the [facility] is tucked under and into the  full base of the wound. 02/05/2022: The wound overall is improved. The undermining is about the same but there is less bone palpable. We have been using Santyl and Hydrofera Blue. Minimal slough accumulation in the 2 weeks since her last visit. 02/19/2022: The visible wound surface is very clean without any slough accumulation. Although I can feel bone in the undermined portion of the wound, it is now covered with a layer of tissue. The undermined area is about the same size, however. We have been using Santyl with Hydrofera Blue. She has been approved for snap VAC use. 02/26/2022: Last week, we applied a snap VAC. The wound dimensions have come in by a bit in all directions. The VAC canister did fill, however, and was starting to leak so the patient's daughter removed the VAC and dressed the wound as we had been previously. There is a little bit of thin slough on the wound surface with some senescent tissue at the inferior margin. Bone is still palpable but covered with a layer of tissue. No odor. 03/06/2022: We continue to have technical difficulties with the snap VAC which resulted in the patient's family removing it and going back to the dressing changes with Hydrofera Blue. It sounds like there are issues maintaining suction and the drape is getting bunched up creating a leak. The wound measured larger today, but the intake nurse thinks that perhaps her measurements were taken slightly differently than last week's. There is just some thin slough on the wound surface as well as some hypertrophic granulation tissue on the lateral border. 03/13/2022: The snap VAC worked much better this week and only began leaking yesterday. At that point, the patient's family removed it and applied Hydrofera Blue to her wound. Today, the tissue coverage over the bone that had been exposed is more substantial. She still has undermining present. There is also some senescent skin around the wound orifice along with some  hypertrophic granulation tissue. 03/20/2022: The snap VAC unfortunately failed on Friday. There has really been no significant change to the wound for several weeks. The degree of undermining is unchanged. The surface is clean. 03/27/2022: Once again, the snap VAC failed prematurely and had to be removed on Saturday. The undermining remains the same. There is more tissue coverage of the previously exposed bone. The wound surface has just a light layer of biofilm and thin slough. She was approved for a standard wound VAC, but there are currently no home health agencies able to staff dressing changes for her; it is too difficult for her and her family to bring her to the wound center more than once a week. 04/03/2022: The snap VAC stayed on but the canister filled up on Sunday. The direct depth of the wound is a little bit shallower but the undermining has not really changed. There is some slightly discolored, darker tissue at the 9 o'clock position. No odor or purulent drainage. 04/11/2022: Using 2 canisters worked well and the snap VAC lasted until yesterday. She has her standard wound VAC with her today. The orifice of the wound has contracted somewhat. There is still substantial undermining at 12:00, but the undermined portion to the right between 2 and 4:00 has closed and somewhat. The previously exposed bone is no longer even palpable under the overlying tissue. No concern for infection. 04/17/2022: A standard VAC dressing was initiated last week. The family has been changing it at home,  due to the inability to secure home health services. It is a little bit challenging to apply, due to the small orifice of the wound with substantial undermining. Today, the undermining at 12:00 seems like it might be a little bit deeper, but the 2-4 o'clock undermining is decreased. There is excellent tissue coverage over the previously exposed bone. There is a little bit of slough accumulation at the orifice, along  with some senescent skin. 04/23/2022: The wound VAC was not changed all week due to the family being overwhelmed with other issues. Fortunately, there does not seem to have been any untoward effects from this. The orifice of the wound remains unchanged but the undermining and tunneling has narrowed to just 1 area at 12:00. No purulent drainage or odor. Good tissue coverage of the previously exposed bone. 04/30/2022: The tunneling has narrowed substantially. There is no purulent drainage or odor from the wound. The orifice of the wound is about the same size and there is some hypertrophic granulation tissue at the opening. 05/07/2022: The undermining has come in substantially. The only particularly deep area is from 1-2 o'clock. The rest of the wound is clean without any purulent drainage or odor. 05/14/2022: The orifice of the wound has contracted further. She continues to have just under 5 cm of tunneling at 12:00, but the wound is clean without any purulent drainage or odor. 05/21/2022: The tunnel has come in by about half a centimeter. The wound is clean without any slough. 05/28/2022: The tunnel continues to contract. The wound is clean with good granulation tissue at the base. No concern for infection. 06/04/2022: The tunnel is about the same length this week, but it is quite a bit narrower. The wound is clean with good granulation tissue. Unfortunately, her family applied the track pad directly over the sponge without any intervening drape and without bridging it away from her spine so she has a small ulcer from the track pad just caudal to the wound. 06/12/2022: The small ulcer from the track pad that was present last week has healed. Otherwise the wound is basically unchanged. 06/18/2022: No real change to the tunneling. The surface of the wound is clean. Electronic Signature(s) Signed: 06/18/2022 2:55:27 PM By: Fredirick Maudlin MD FACS Entered By: Fredirick Maudlin on 06/18/2022  14:55:27 -------------------------------------------------------------------------------- Physical Exam Details Patient Name: Date of Service: Stacy Like B. 06/18/2022 2:00 PM Medical Record Number: [id number] Patient Account Number: [id number] Date of Birth/Sex: Treating RN: 04-19-1923 (88 y.o. F) Primary Care Provider: [name] Other Clinician: Referring Provider: Treating Provider/Extender: Nicholaus Corolla in Treatment: 25 Constitutional . . . . No acute distress.Marland Kitchen Respiratory Normal work of breathing on room air.. Notes 06/18/2022: No real change to the tunneling. The surface of the wound is clean. Electronic Signature(s) Signed: 06/18/2022 2:56:15 PM By: Fredirick Maudlin MD FACS Entered By: Fredirick Maudlin on 06/18/2022 14:56:14 -------------------------------------------------------------------------------- Physician Orders Details Patient Name: Date of Service: Stacy Like B. 06/18/2022 2:00 PM Medical Record Number: [id number] Patient Account Number: [id number] Date of Birth/Sex: Treating RN: 1923-06-20 (88 y.o. Elam Dutch Primary Care Provider: [name] Other Clinician: Referring Provider: Treating Provider/Extender: Nicholaus Corolla in Treatment: 25 Verbal / Phone Orders: No Diagnosis Coding ICD-10 Coding Code Description L89.104 Pressure ulcer of unspecified part of back, stage 4 E11.622 Type 2 diabetes mellitus with other skin ulcer S22.089S Unspecified fracture of T11-T12 vertebra, sequela Follow-up Appointments ppointment in 1 week. - Dr Celine Ahr - Room 1 - with Vaughan Basta  Return A Anesthetic Wound #1 Thoracic spine (In clinic) Topical Lidocaine 4% applied to wound bed Bathing/ Shower/ Hygiene May shower with protection but do not get wound dressing(s) wet. Negative Presssure Wound Therapy Wound Vac to wound continuously at 180m/hg pressure - Hold VAC this week Black Foam Off-Loading Turn  and reposition every 2 hours Other: - KEEP PRESSURE OFF OF BACK MAY USE EGG CRATE CUSHION Additional Orders / Instructions Follow Nutritious Diet Wound Treatment Wound #1 - Thoracic spine Cleanser: Wound Cleanser (Generic) Every Other Day/30 Days Discharge Instructions: Cleanse the wound with wound cleanser prior to applying a clean dressing using gauze sponges, not tissue or cotton balls. Peri-Wound Care: Skin Prep Every Other Day/30 Days Discharge Instructions: Use skin prep as directed Prim Dressing: Iodoform packing strip 1/2 (in) Every Other Day/30 Days ary Discharge Instructions: Lightly pack as instructed Secondary Dressing: Zetuvit Plus Silicone Border Dressing 4x4 (in/in) (DME) (Dispense As Written) Every Other Day/30 Days Discharge Instructions: Apply silicone border over primary dressing as directed. Electronic Signature(s) Signed: 06/18/2022 3:19:09 PM By: CFredirick MaudlinMD FACS Signed: 06/18/2022 4:51:35 PM By: BBaruch GoutyRN, BSN Entered By: BBaruch Goutyon 06/18/2022 15:01:46 -------------------------------------------------------------------------------- Problem List Details Patient Name: Date of Service: HAlden Perry BLiberty 06/18/2022 2:00 PM Medical Record Number: [id number] Account Number: [id number] of Birth/Sex: Treating RN: 701/28/1924(88y.o. FElam DutchPrimary Care Provider: RJanace LittenOther Clinician: Referring Provider: Treating Provider/Extender: CNicholaus Corollain Treatment: 25 Active Problems ICD-10 Encounter Code Description Active Date MDM Diagnosis L89.104 Pressure ulcer of unspecified part of back, stage 4 12/19/2021 No Yes E11.622 Type 2 diabetes mellitus with other skin ulcer 12/19/2021 No Yes S22.089S Unspecified fracture of T11-T12 vertebra, sequela 12/19/2021 No Yes Inactive Problems Resolved Problems Electronic Signature(s) Signed: 06/18/2022 2:50:18 PM By: CFredirick MaudlinMD  FACS Entered By: CFredirick Maudlinon 06/18/2022 14:50:17 -------------------------------------------------------------------------------- Progress Note Details Patient Name: Date of Service: HJim LikeB. 06/18/2022 2:00 PM Medical Record Number: [id number] Account Number: [id number] of Birth/Sex: Treating RN: [birth year](88y.o. F) Primary Care Provider: RJanace LittenOther Clinician: Referring Provider: Treating Provider/Extender: CNicholaus Corollain Treatment: 25 Subjective Chief Complaint Information obtained from Patient Patient is at the clinic for treatment of an open pressure ulcer History of Present Illness (HPI) ADMISSION 12/19/21 This is a 88year old woman who is presenting to clinic today with a an open ulcer over her thoracic spine. She has severe kyphosis and a history of prior T12 fracture, as well as type 2 diabetes mellitus. The wound has been present for about a year. She is accompanied by her daughters, 1 of whom is a nMarine scientist They have been trying to offload the site by using pillows and supports to keep returned while in bed, and an eggcrate foam with a cut out for the wound for when she is sitting up. They have been applying Santyl to the site. She does have home health and the provider took a culture which was positive for Pseudomonas. She was on ciprofloxacin for this. A recent repeat culture was negative. Most recent hemoglobin A1c was 6.5, in February. A C-reactive protein also obtained in February was elevated at 24.1. No imaging has been performed of the site. The patient denies significant pain. 12/25/2021: Last week, I took a bone biopsy as well as culture. Fortunately, the bone biopsy was negative for osteomyelitis. The culture returned with rare corynebacterium and rare Enterococcus faecalis. We have been using Santyl and Hydrofera  Blue. T oday, the wound appears cleaner and there are buds of granulation tissue forming  around the perimeter. 01/01/2022: Due to the positive culture, we switched to topical gentamicin. Today, she has had a little bit of a slough accumulation. The wound continues to have buds of granulation tissue forming. Apparently, the Hydrofera Blue was placed just over the wound opening rather than down in to contact the surface. No purulent drainage or odor. 01/08/2022: Last week, there was more slough in the wound bed, so I changed back to Santyl. There is now hypertrophic granulation tissue around the wound opening. Once again, we seem to be having some difficulty getting the Hydrofera Blue tucked up into the wound; there is undermining that extends for 3 to 4 cm at the 12 o'clock position and the Hydrofera Blue is simply sitting on top of the wound without being in contact with the surface. Bone is still palpable, but tissue is beginning to close in over it. 01/22/2022: The wound looks much cleaner this week and the hypertrophic granulation tissue responded appropriately to silver nitrate application. She continues to have undermining for about 4 cm from 12:00 to 4:00. I can still feel the bone in the base at about the 2 o'clock position, but it is less prominent and tissue is closing over. We have been using Santyl and Hydrofera Blue. The daughter that accompanies the patient today indicates that they have been very diligent in making sure the [facility] is tucked under and into the full base of the wound. 02/05/2022: The wound overall is improved. The undermining is about the same but there is less bone palpable. We have been using Santyl and Hydrofera Blue. Minimal slough accumulation in the 2 weeks since her last visit. 02/19/2022: The visible wound surface is very clean without any slough accumulation. Although I can feel bone in the undermined portion of the wound, it is now covered with a layer of tissue. The undermined area is about the same size, however. We have been using Santyl with  Hydrofera Blue. She has been approved for snap VAC use. 02/26/2022: Last week, we applied a snap VAC. The wound dimensions have come in by a bit in all directions. The VAC canister did fill, however, and was starting to leak so the patient's daughter removed the VAC and dressed the wound as we had been previously. There is a little bit of thin slough on the wound surface with some senescent tissue at the inferior margin. Bone is still palpable but covered with a layer of tissue. No odor. 03/06/2022: We continue to have technical difficulties with the snap VAC which resulted in the patient's family removing it and going back to the dressing changes with Hydrofera Blue. It sounds like there are issues maintaining suction and the drape is getting bunched up creating a leak. The wound measured larger today, but the intake nurse thinks that perhaps her measurements were taken slightly differently than last week's. There is just some thin slough on the wound surface as well as some hypertrophic granulation tissue on the lateral border. 03/13/2022: The snap VAC worked much better this week and only began leaking yesterday. At that point, the patient's family removed it and applied Hydrofera Blue to her wound. Today, the tissue coverage over the bone that had been exposed is more substantial. She still has undermining present. There is also some senescent skin around the wound orifice along with some hypertrophic granulation tissue. 03/20/2022: The snap VAC unfortunately failed on Friday.  There has really been no significant change to the wound for several weeks. The degree of undermining is unchanged. The surface is clean. 03/27/2022: Once again, the snap VAC failed prematurely and had to be removed on Saturday. The undermining remains the same. There is more tissue coverage of the previously exposed bone. The wound surface has just a light layer of biofilm and thin slough. She was approved for a standard wound  VAC, but there are currently no home health agencies able to staff dressing changes for her; it is too difficult for her and her family to bring her to the wound center more than once a week. 04/03/2022: The snap VAC stayed on but the canister filled up on Sunday. The direct depth of the wound is a little bit shallower but the undermining has not really changed. There is some slightly discolored, darker tissue at the 9 o'clock position. No odor or purulent drainage. 04/11/2022: Using 2 canisters worked well and the snap VAC lasted until yesterday. She has her standard wound VAC with her today. The orifice of the wound has contracted somewhat. There is still substantial undermining at 12:00, but the undermined portion to the right between 2 and 4:00 has closed and somewhat. The previously exposed bone is no longer even palpable under the overlying tissue. No concern for infection. 04/17/2022: A standard VAC dressing was initiated last week. The family has been changing it at home, due to the inability to secure home health services. It is a little bit challenging to apply, due to the small orifice of the wound with substantial undermining. Today, the undermining at 12:00 seems like it might be a little bit deeper, but the 2-4 o'clock undermining is decreased. There is excellent tissue coverage over the previously exposed bone. There is a little bit of slough accumulation at the orifice, along with some senescent skin. 04/23/2022: The wound VAC was not changed all week due to the family being overwhelmed with other issues. Fortunately, there does not seem to have been any untoward effects from this. The orifice of the wound remains unchanged but the undermining and tunneling has narrowed to just 1 area at 12:00. No purulent drainage or odor. Good tissue coverage of the previously exposed bone. 04/30/2022: The tunneling has narrowed substantially. There is no purulent drainage or odor from the wound. The  orifice of the wound is about the same size and there is some hypertrophic granulation tissue at the opening. 05/07/2022: The undermining has come in substantially. The only particularly deep area is from 1-2 o'clock. The rest of the wound is clean without any purulent drainage or odor. 05/14/2022: The orifice of the wound has contracted further. She continues to have just under 5 cm of tunneling at 12:00, but the wound is clean without any purulent drainage or odor. 05/21/2022: The tunnel has come in by about half a centimeter. The wound is clean without any slough. 05/28/2022: The tunnel continues to contract. The wound is clean with good granulation tissue at the base. No concern for infection. 06/04/2022: The tunnel is about the same length this week, but it is quite a bit narrower. The wound is clean with good granulation tissue. Unfortunately, her family applied the track pad directly over the sponge without any intervening drape and without bridging it away from her spine so she has a small ulcer from the track pad just caudal to the wound. 06/12/2022: The small ulcer from the track pad that was present last week has healed. Otherwise  the wound is basically unchanged. 06/18/2022: No real change to the tunneling. The surface of the wound is clean. Patient History Family History Unknown History. Social History Never smoker, Marital Status - Married, Alcohol Use - Never, Drug Use - No History, Caffeine Use - Rarely. Medical History Ear/Nose/Mouth/Throat Denies history of Chronic sinus problems/congestion, Middle ear problems Cardiovascular Patient has history of Hypertension Endocrine Patient has history of Type II Diabetes Immunological Denies history of Lupus Erythematosus, Raynaudoos, Scleroderma Oncologic Denies history of Received Chemotherapy, Received Radiation Hospitalization/Surgery History - t/11 t/12 FRACTURE/SURGERY. - FEMUR FX SURGERY. - TOTAL HYSTERECTOMY. - CHOLECYSTECTOMY.  - TONSILLECTOMY. Medical A Surgical History Notes nd Ear/Nose/Mouth/Throat PARTIAL HEARING LEFT HEAR W/ HEARING AID TOTAL DEAFNESS RIGHT EAR Gastrointestinal Melena Endocrine Hypothyroidism Hyperlipidemia associated with type 2 diabetes mellitus (HCC) Musculoskeletal Closed T12 fracture (HCC) Hip fracture (HCC) Closed comminuted intertrochanteric fracture of proximal end of right femur (HCC) Objective Constitutional No acute distress.. Vitals Time Taken: 2:27 PM, Height: 62 in, Weight: 110 lbs, BMI: 20.1, Temperature: 98.6 F, Pulse: 82 bpm, Respiratory Rate: 18 breaths/min, Blood Pressure: 136/76 mmHg. Respiratory Normal work of breathing on room air.. General Notes: 06/18/2022: No real change to the tunneling. The surface of the wound is clean. Integumentary (Hair, Skin) Wound #1 status is Open. Original cause of wound was Gradually Appeared. The date acquired was: 12/19/2020. The wound has been in treatment 25 weeks. The wound is located on the Thoracic spine. The wound measures 1.1cm length x 0.9cm width x 0.7cm depth; 0.778cm^2 area and 0.544cm^3 volume. There is Fat Layer (Subcutaneous Tissue) exposed. Tunneling has been noted at 4:00 with a maximum distance of 4.7cm. Undermining begins at 9:00 and ends at 5:00 with a maximum distance of 4.7cm. There is a medium amount of serosanguineous drainage noted. The wound margin is well defined and not attached to the wound base. There is large (67-100%) red, pink granulation within the wound bed. There is a small (1-33%) amount of necrotic tissue within the wound bed including Adherent Slough. Assessment Active Problems ICD-10 Pressure ulcer of unspecified part of back, stage 4 Type 2 diabetes mellitus with other skin ulcer Unspecified fracture of T11-T12 vertebra, sequela Plan Follow-up Appointments: Return Appointment in 1 week. - Dr Celine Ahr - Room 1 - with Vaughan Basta Anesthetic: Wound #1 Thoracic spine: (In clinic) Topical Lidocaine  4% applied to wound bed Bathing/ Shower/ Hygiene: May shower with protection but do not get wound dressing(s) wet. Negative Presssure Wound Therapy: Wound Vac to wound continuously at 178m/hg pressure - Hold VAC this week Black Foam Off-Loading: Turn and reposition every 2 hours Other: - KEEP PRESSURE OFF OF BACK MAY USE EGG CRATE CUSHION Additional Orders / Instructions: Follow Nutritious Diet WOUND #1: - Thoracic spine Wound Laterality: Cleanser: Wound Cleanser (Generic) Every Other Day/30 Days Discharge Instructions: Cleanse the wound with wound cleanser prior to applying a clean dressing using gauze sponges, not tissue or cotton balls. Peri-Wound Care: Skin Prep Every Other Day/30 Days Discharge Instructions: Use skin prep as directed Prim Dressing: Iodoform packing strip 1/2 (in) Every Other Day/30 Days ary Discharge Instructions: Lightly pack as instructed Secondary Dressing: Zetuvit Plus Silicone Border Dressing 4x4 (in/in) Every Other Day/30 Days Discharge Instructions: Apply silicone border over primary dressing as directed. 06/18/2022: No real change to the tunneling. The surface of the wound is clean. No debridement was necessary today. I have not seen much change in the tunneling over the past few weeks despite use of negative pressure wound therapy. We are going to  try packing the wound with half-inch iodoform gauze to see if we can make any progress. She will follow-up in 1 week. Electronic Signature(s) Signed: 06/18/2022 2:57:11 PM By: Fredirick Maudlin MD FACS Entered By: Fredirick Maudlin on 06/18/2022 14:57:11 -------------------------------------------------------------------------------- HxROS Details Patient Name: Date of Service: [name], [place]. 06/18/2022 2:00 PM Medical Record Number: [id number] Patient Account Number: [id number] Date of Birth/Sex: Treating RN: Apr 17, 1923 (88 y.o. F) Primary Care Provider: [name] Other Clinician: Referring  Provider: Treating Provider/Extender: Nicholaus Corolla in Treatment: 25 Ear/Nose/Mouth/Throat Medical History: Negative for: Chronic sinus problems/congestion; Middle ear problems Past Medical History Notes: PARTIAL HEARING LEFT HEAR W/ HEARING AID TOTAL DEAFNESS RIGHT EAR Cardiovascular Medical History: Positive for: Hypertension Gastrointestinal Medical History: Past Medical History Notes: Melena Endocrine Medical History: Positive for: Type II Diabetes Past Medical History Notes: Hypothyroidism Hyperlipidemia associated with type 2 diabetes mellitus ([place]) Treated with: Diet Immunological Medical History: Negative for: Lupus Erythematosus; Raynauds; Scleroderma Musculoskeletal Medical History: Past Medical History Notes: Closed T12 fracture ([place]) Hip fracture ([place]) Closed comminuted intertrochanteric fracture of proximal end of right femur [facility]) Oncologic Medical History: Negative for: Received Chemotherapy; Received Radiation Immunizations Pneumococcal Vaccine: Received Pneumococcal Vaccination: Yes Received Pneumococcal Vaccination On or After 60th Birthday: No Implantable Devices None Hospitalization / Surgery History Type of Hospitalization/Surgery t/11 t/12 FRACTURE/SURGERY FEMUR FX SURGERY TOTAL HYSTERECTOMY CHOLECYSTECTOMY TONSILLECTOMY Family and Social History Unknown History: Yes; Never smoker; Marital Status - Married; Alcohol Use: Never; Drug Use: No History; Caffeine Use: Rarely; Financial Concerns: No; Food, Clothing or Shelter Needs: No; Support System Lacking: No; Transportation Concerns: No Electronic Signature(s) Signed: 06/18/2022 3:19:09 PM By: Fredirick Maudlin MD FACS Entered By: Fredirick Maudlin on 06/18/2022 14:55:33 -------------------------------------------------------------------------------- SuperBill Details Patient Name: Date of Service: Stacy Like B. 06/18/2022 Medical Record Number: [id number] Patient  Account Number: [id number] Date of Birth/Sex: Treating RN: April 20, 1923 (88 y.o. F) Primary Care Provider: [name] Other Clinician: Referring Provider: Treating Provider/Extender: Nicholaus Corolla in Treatment: 25 Diagnosis Coding ICD-10 Codes Code Description L89.104 Pressure ulcer of unspecified part of back, stage 4 E11.622 Type 2 diabetes mellitus with other skin ulcer S22.089S Unspecified fracture of T11-T12 vertebra, sequela Facility Procedures Physician Procedures : CPT4 Code Description Modifier [id number] - WC PHYS LEVEL 4 - EST PT ICD-10 Diagnosis Description L89.104 Pressure ulcer of unspecified part of back, stage 4 S22.089S Unspecified fracture of T11-T12 vertebra, sequela E11.622 Type 2 diabetes mellitus  with other skin ulcer Quantity: 1 Electronic Signature(s) Signed: 06/18/2022 4:36:06 PM By: Fredirick Maudlin MD FACS Signed: 06/18/2022 4:51:35 PM By: Baruch Gouty RN, BSN Previous Signature: 06/18/2022 2:57:31 PM Version By: Fredirick Maudlin MD FACS Entered By: Baruch Gouty on 06/18/2022 16:22:36

## 2022-06-25 ENCOUNTER — Encounter (HOSPITAL_BASED_OUTPATIENT_CLINIC_OR_DEPARTMENT_OTHER): Payer: Medicare Other | Admitting: General Surgery

## 2022-06-25 DIAGNOSIS — L89104 Pressure ulcer of unspecified part of back, stage 4: Secondary | ICD-10-CM | POA: Diagnosis not present

## 2022-06-25 NOTE — Progress Notes (Signed)
[name], [name] ([id number]) Visit Report for 06/25/2022 Arrival Information Details Patient Name: Date of Service: [name], [name] 06/25/2022 11:00 A M Medical Record Number: [id number] Patient Account Number: [id number] Date of Birth/Sex: Treating RN: 05/14/1923 (88 y.o. Harlow Ohms Primary Care Harriett Azar: [name] Other Clinician: Referring Joany Khatib: Treating Katrianna Friesenhahn/Extender: Nicholaus Corolla in Treatment: 26 Visit Information History Since Last Visit Added or deleted any medications: No Patient Arrived: Wheel Chair Any new allergies or adverse reactions: No Arrival Time: 11:54 Had a fall or experienced change in No Accompanied By: family activities of daily living that may affect Transfer Assistance: None risk of falls: Patient Identification Verified: Yes Signs or symptoms of abuse/neglect since last visito No Secondary Verification Process Completed: Yes Hospitalized since last visit: No Patient Requires Transmission-Based Precautions: No Implantable device outside of the clinic excluding No Patient Has Alerts: No cellular tissue based products placed in the center since last visit: Has Dressing in Place as Prescribed: Yes Pain Present Now: Yes Electronic Signature(s) Signed: 06/25/2022 5:25:08 PM By: Adline Peals Entered By: Adline Peals on 06/25/2022 11:55:31 -------------------------------------------------------------------------------- Clinic Level of Care Assessment Details Patient Name: Date of Service: KHALIL, BELOTE 06/25/2022 11:00 A M Medical Record Number: [id number] Patient Account Number: [id number] Date of Birth/Sex: Treating RN: 09-14-1923 (88 y.o. Elam Dutch Primary Care Yariela Tison: [name] Other Clinician: Referring Forrester Blando: Treating Skiler Olden/Extender: Nicholaus Corolla in Treatment: 26 Clinic Level of Care Assessment Items TOOL 4 Quantity Score []  - 0 Use  when only an EandM is performed on FOLLOW-UP visit ASSESSMENTS - Nursing Assessment / Reassessment X- 1 10 Reassessment of Co-morbidities (includes updates in patient status) X- 1 5 Reassessment of Adherence to Treatment Plan ASSESSMENTS - Wound and Skin A ssessment / Reassessment X - Simple Wound Assessment / Reassessment - one wound 1 5 []  - 0 Complex Wound Assessment / Reassessment - multiple wounds []  - 0 Dermatologic / Skin Assessment (not related to wound area) ASSESSMENTS - Focused Assessment []  - 0 Circumferential Edema Measurements - multi extremities []  - 0 Nutritional Assessment / Counseling / Intervention []  - 0 Lower Extremity Assessment (monofilament, tuning fork, pulses) []  - 0 Peripheral Arterial Disease Assessment (using hand held doppler) ASSESSMENTS - Ostomy and/or Continence Assessment and Care []  - 0 Incontinence Assessment and Management []  - 0 Ostomy Care Assessment and Management (repouching, etc.) PROCESS - Coordination of Care X - Simple Patient / Family Education for ongoing care 1 15 []  - 0 Complex (extensive) Patient / Family Education for ongoing care X- 1 10 Staff obtains Programmer, systems, Records, T Results / Process Orders est []  - 0 Staff telephones HHA, Nursing Homes / Clarify orders / etc []  - 0 Routine Transfer to another Facility (non-emergent condition) []  - 0 Routine Hospital Admission (non-emergent condition) []  - 0 New Admissions / Biomedical engineer / Ordering NPWT Apligraf, etc. , []  - 0 Emergency Hospital Admission (emergent condition) X- 1 10 Simple Discharge Coordination []  - 0 Complex (extensive) Discharge Coordination PROCESS - Special Needs []  - 0 Pediatric / Minor Patient Management []  - 0 Isolation Patient Management []  - 0 Hearing / Language / Visual special needs []  - 0 Assessment of Community assistance (transportation, D/C planning, etc.) []  - 0 Additional assistance / Altered mentation []  - 0 Support  Surface(s) Assessment (bed, cushion, seat, etc.) INTERVENTIONS - Wound Cleansing / Measurement X - Simple Wound Cleansing - one wound 1 5 []  - 0 Complex Wound Cleansing - multiple  wounds X- 1 5 Wound Imaging (photographs - any number of wounds) []  - 0 Wound Tracing (instead of photographs) X- 1 5 Simple Wound Measurement - one wound []  - 0 Complex Wound Measurement - multiple wounds INTERVENTIONS - Wound Dressings X - Small Wound Dressing one or multiple wounds 1 10 []  - 0 Medium Wound Dressing one or multiple wounds []  - 0 Large Wound Dressing one or multiple wounds []  - 0 Application of Medications - topical []  - 0 Application of Medications - injection INTERVENTIONS - Miscellaneous []  - 0 External ear exam []  - 0 Specimen Collection (cultures, biopsies, blood, body fluids, etc.) []  - 0 Specimen(s) / Culture(s) sent or taken to Lab for analysis []  - 0 Patient Transfer (multiple staff / Civil Service fast streamer / Similar devices) []  - 0 Simple Staple / Suture removal (25 or less) []  - 0 Complex Staple / Suture removal (26 or more) []  - 0 Hypo / Hyperglycemic Management (close monitor of Blood Glucose) []  - 0 Ankle / Brachial Index (ABI) - do not check if billed separately X- 1 5 Vital Signs Has the patient been seen at the hospital within the last three years: Yes Total Score: 85 Level Of Care: New/Established - Level 3 Electronic Signature(s) Signed: 06/25/2022 6:17:04 PM By: Baruch Gouty RN, BSN Entered By: Baruch Gouty on 06/25/2022 13:08:35 -------------------------------------------------------------------------------- Lower Extremity Assessment Details Patient Name: Date of Service: [name], [name] 06/25/2022 11:00 A M Medical Record Number: [id number] Patient Account Number: [id number] Date of Birth/Sex: Treating RN: Sep 18, 1923 (88 y.o. Harlow Ohms Primary Care Estevan Kersh: [name] Other Clinician: Referring Alyx Gee: Treating Delmo Matty/Extender:  Nicholaus Corolla in Treatment: 26 Electronic Signature(s) Signed: 06/25/2022 5:25:08 PM By: Adline Peals Entered By: Adline Peals on 06/25/2022 11:56:23 -------------------------------------------------------------------------------- Multi Wound Chart Details Patient Name: Date of Service: [name], Stacy Cola B. 06/25/2022 11:00 A M Medical Record Number: [id number] Patient Account Number: [id number] Date of Birth/Sex: Treating RN: 1923-01-29 (88 y.o. Elam Dutch Primary Care Silvio Sausedo: [name] Other Clinician: Referring Letisia Schwalb: Treating Niccolas Loeper/Extender: Nicholaus Corolla in Treatment: 26 Vital Signs Height(in): 62 Pulse(bpm): 80 Weight(lbs): 110 Blood Pressure(mmHg): 117/71 Body Mass Index(BMI): 20.1 Temperature(F): 97.7 Respiratory Rate(breaths/min): 18 Photos: [N/A:N/A] Thoracic spine N/A N/A Wound Location: Gradually Appeared N/A N/A Wounding Event: Pressure Ulcer N/A N/A Primary Etiology: Hypertension, Type II Diabetes N/A N/A Comorbid History: 12/19/2020 N/A N/A Date Acquired: 34 N/A N/A Weeks of Treatment: Open N/A N/A Wound Status: No N/A N/A Wound Recurrence: 1.4x1.1x0.5 N/A N/A Measurements L x W x D (cm) 1.21 N/A N/A A (cm) : rea 0.605 N/A N/A Volume (cm) : 65.00% N/A N/A % Reduction in A rea: 88.30% N/A N/A % Reduction in Volume: 12 Starting Position 1 (o'clock): 3 Ending Position 1 (o'clock): 4.2 Maximum Distance 1 (cm): Yes N/A N/A Undermining: Category/Stage IV N/A N/A Classification: Medium N/A N/A Exudate A mount: Serosanguineous N/A N/A Exudate Type: red, brown N/A N/A Exudate Color: Well defined, not attached N/A N/A Wound Margin: Large (67-100%) N/A N/A Granulation A mount: Red, Pink N/A N/A Granulation Quality: Small (1-33%) N/A N/A Necrotic A mount: Fat Layer (Subcutaneous Tissue): Yes N/A N/A Exposed Structures: Fascia: No Tendon: No Muscle:  No Joint: No Bone: No Small (1-33%) N/A N/A Epithelialization: Treatment Notes Electronic Signature(s) Signed: 06/25/2022 12:18:14 PM By: Fredirick Maudlin MD FACS Signed: 06/25/2022 6:17:04 PM By: Baruch Gouty RN, BSN Entered By: Fredirick Maudlin on 06/25/2022 12:18:14 -------------------------------------------------------------------------------- Multi-Disciplinary Care Plan Details Patient Name: Date of Service:  [name], [name] 06/25/2022 11:00 A M Medical Record Number: [id number] Patient Account Number: [id number] Date of Birth/Sex: Treating RN: [birth year] (88 y.o. Harlow Ohms Primary Care Chantille Navarrete: [name] Other Clinician: Referring Yasira Engelson: Treating Zacharey Jensen/Extender: Nicholaus Corolla in Treatment: 26 Multidisciplinary Care Plan reviewed with physician Active Inactive Pressure Nursing Diagnoses: Knowledge deficit related to causes and risk factors for pressure ulcer development Knowledge deficit related to management of pressures ulcers Potential for impaired tissue integrity related to pressure, friction, moisture, and shear Goals: Patient will remain free of pressure ulcers Date Initiated: 12/25/2021 Date Inactivated: 04/11/2022 Target Resolution Date: 04/06/2022 Goal Status: Met Patient/caregiver will verbalize understanding of pressure ulcer management Date Initiated: 12/25/2021 Target Resolution Date: 08/03/2022 Goal Status: Active Interventions: Assess: immobility, friction, shearing, incontinence upon admission and as needed Assess offloading mechanisms upon admission and as needed Notes: Wound/Skin Impairment Nursing Diagnoses: Impaired tissue integrity Knowledge deficit related to ulceration/compromised skin integrity Goals: Patient will have a decrease in wound volume by X% from date: (specify in notes) Date Initiated: 12/20/2021 Date Inactivated: 01/08/2022 Target Resolution Date: 01/12/2022 Goal Status:  Unmet Unmet Reason: pressure relief Patient/caregiver will verbalize understanding of skin care regimen Date Initiated: 12/20/2021 Target Resolution Date: 08/03/2022 Goal Status: Active Ulcer/skin breakdown will have a volume reduction of 30% by week 4 Date Initiated: 12/20/2021 Date Inactivated: 04/11/2022 Target Resolution Date: 04/06/2022 Goal Status: Unmet Unmet Reason: infection, osteo Ulcer/skin breakdown will have a volume reduction of 50% by week 8 Date Initiated: 12/20/2021 Date Inactivated: 04/11/2022 Target Resolution Date: 04/06/2022 Unmet Reason: osteo, severe Goal Status: Unmet kyphosis Interventions: Assess patient/caregiver ability to obtain necessary supplies Assess patient/caregiver ability to perform ulcer/skin care regimen upon admission and as needed Assess ulceration(s) every visit Notes: Electronic Signature(s) Signed: 06/25/2022 5:25:08 PM By: Adline Peals Entered By: Adline Peals on 06/25/2022 11:56:38 -------------------------------------------------------------------------------- Pain Assessment Details Patient Name: Date of Service: [name], [name] 06/25/2022 11:00 A M Medical Record Number: [id number] Patient Account Number: [id number] Date of Birth/Sex: Treating RN: 1923/05/07 (88 y.o. Harlow Ohms Primary Care Laysha Childers: [name] Other Clinician: Referring Holden Draughon: Treating Noeh Sparacino/Extender: Nicholaus Corolla in Treatment: 26 Active Problems Location of Pain Severity and Description of Pain Patient Has Paino Yes Site Locations Pain Location: Pain in Ulcers Duration of the Pain. Constant / Intermittento Intermittent Rate the pain. Current Pain Level: 7 Pain Management and Medication Current Pain Management: Electronic Signature(s) Signed: 06/25/2022 5:25:08 PM By: Adline Peals Entered By: Adline Peals on 06/25/2022  11:56:19 -------------------------------------------------------------------------------- Patient/Caregiver Education Details Patient Name: Date of Service: [name], Oumou B. 9/18/2023andnbsp11:00 [place] Record Number: [id number] Patient Account Number: [id number] Date of Birth/Gender: Treating RN: [birth year] (88 y.o. Harlow Ohms Primary Care Physician: [name] Other Clinician: Referring Physician: Treating Physician/Extender: Nicholaus Corolla in Treatment: 26 Education Assessment Education Provided To: Patient Education Topics Provided Wound/Skin Impairment: Methods: Explain/Verbal Responses: Reinforcements needed, State content correctly Electronic Signature(s) Signed: 06/25/2022 5:25:08 PM By: Adline Peals Entered By: Adline Peals on 06/25/2022 11:56:48 -------------------------------------------------------------------------------- Wound Assessment Details Patient Name: Date of Service: [name], [name] 06/25/2022 11:00 A M Medical Record Number: [id number] Patient Account Number: [id number] Date of Birth/Sex: Treating RN: [birth year] (88 y.o. Harlow Ohms Primary Care Jamas Jaquay: [name] Other Clinician: Referring Ronald Vinsant: Treating Loghan Kurtzman/Extender: Nicholaus Corolla in Treatment: 26 Wound Status Wound Number: 1 Primary Etiology: Pressure Ulcer Wound Location: Thoracic spine Wound Status: Open Wounding Event: Gradually Appeared Comorbid History: Hypertension, Type II Diabetes  Date Acquired: 12/19/2020 Weeks Of Treatment: 26 Clustered Wound: No Photos Wound Measurements Length: (cm) 1.4 Width: (cm) 1.1 Depth: (cm) 0.5 Area: (cm) 1.21 Volume: (cm) 0.605 % Reduction in Area: 65% % Reduction in Volume: 88.3% Epithelialization: Small (1-33%) Tunneling: No Undermining: Yes Starting Position (o'clock): 12 Ending Position (o'clock): 3 Maximum Distance: (cm)  4.2 Wound Description Classification: Category/Stage IV Wound Margin: Well defined, not attached Exudate Amount: Medium Exudate Type: Serosanguineous Exudate Color: red, brown Foul Odor After Cleansing: No Slough/Fibrino Yes Wound Bed Granulation Amount: Large (67-100%) Exposed Structure Granulation Quality: Red, Pink Fascia Exposed: No Necrotic Amount: Small (1-33%) Fat Layer (Subcutaneous Tissue) Exposed: Yes Necrotic Quality: Adherent Slough Tendon Exposed: No Muscle Exposed: No Joint Exposed: No Bone Exposed: No Electronic Signature(s) Signed: 06/25/2022 5:25:08 PM By: Adline Peals Signed: 06/25/2022 6:17:04 PM By: Baruch Gouty RN, BSN Entered By: Baruch Gouty on 06/25/2022 12:01:25 -------------------------------------------------------------------------------- [place] Details Patient Name: Date of Service: Stacy Like B. 06/25/2022 11:00 A M Medical Record Number: [id number] Patient Account Number: [id number] Date of Birth/Sex: Treating RN: [birth year] (88 y.o. Harlow Ohms Primary Care Seleta Hovland: [name] Other Clinician: Referring Kleo Dungee: Treating Jodee Wagenaar/Extender: Nicholaus Corolla in Treatment: 26 Vital Signs Time Taken: 11:55 Temperature (F): 97.7 Height (in): 62 Pulse (bpm): 80 Weight (lbs): 110 Respiratory Rate (breaths/min): 18 Body Mass Index (BMI): 20.1 Blood Pressure (mmHg): 117/71 Reference Range: 80 - 120 mg / dl Electronic Signature(s) Signed: 06/25/2022 5:25:08 PM By: Adline Peals Entered By: Adline Peals on 06/25/2022 11:55:43

## 2022-06-25 NOTE — Progress Notes (Signed)
TIERA, MENSINGER ([id number]) Visit Report for 06/25/2022 Chief Complaint Document Details Patient Name: Date of Service: [name], [name] 06/25/2022 11:00 A M Medical Record Number: [id number] Patient Account Number: [id number] Date of Birth/Sex: Treating RN: 13-Feb-1923 (88 y.o. Elam Dutch Primary Care Provider: [name] Other Clinician: Referring Provider: Treating Provider/Extender: Nicholaus Corolla in Treatment: 26 Information Obtained from: Patient Chief Complaint Patient is at the clinic for treatment of an open pressure ulcer Electronic Signature(s) Signed: 06/25/2022 12:18:20 PM By: Fredirick Maudlin MD FACS Entered By: Fredirick Maudlin on 06/25/2022 12:18:20 -------------------------------------------------------------------------------- HPI Details Patient Name: Date of Service: Stacy Like B. 06/25/2022 11:00 A M Medical Record Number: [id number] Patient Account Number: [id number] Date of Birth/Sex: Treating RN: 11/10/1922 (88 y.o. Elam Dutch Primary Care Provider: [name] Other Clinician: Referring Provider: Treating Provider/Extender: Nicholaus Corolla in Treatment: 26 History of Present Illness HPI Description: ADMISSION 12/19/21 This is a 88 year old woman who is presenting to clinic today with a an open ulcer over her thoracic spine. She has severe kyphosis and a history of prior T12 fracture, as well as type 2 diabetes mellitus. The wound has been present for about a year. She is accompanied by her daughters, 1 of whom is a Marine scientist. They have been trying to offload the site by using pillows and supports to keep returned while in bed, and an eggcrate foam with a cut out for the wound for when she is sitting up. They have been applying Santyl to the site. She does have home health and the provider took a culture which was positive for Pseudomonas. She was on ciprofloxacin for this. A recent repeat  culture was negative. Most recent hemoglobin A1c was 6.5, in February. A C-reactive protein also obtained in February was elevated at 24.1. No imaging has been performed of the site. The patient denies significant pain. 12/25/2021: Last week, I took a bone biopsy as well as culture. Fortunately, the bone biopsy was negative for osteomyelitis. The culture returned with rare corynebacterium and rare Enterococcus faecalis. We have been using Santyl and Hydrofera Blue. T oday, the wound appears cleaner and there are buds of granulation tissue forming around the perimeter. 01/01/2022: Due to the positive culture, we switched to topical gentamicin. Today, she has had a little bit of a slough accumulation. The wound continues to have buds of granulation tissue forming. Apparently, the Hydrofera Blue was placed just over the wound opening rather than down in to contact the surface. No purulent drainage or odor. 01/08/2022: Last week, there was more slough in the wound bed, so I changed back to Santyl. There is now hypertrophic granulation tissue around the wound opening. Once again, we seem to be having some difficulty getting the Hydrofera Blue tucked up into the wound; there is undermining that extends for 3 to 4 cm at the 12 o'clock position and the Hydrofera Blue is simply sitting on top of the wound without being in contact with the surface. Bone is still palpable, but tissue is beginning to close in over it. 01/22/2022: The wound looks much cleaner this week and the hypertrophic granulation tissue responded appropriately to silver nitrate application. She continues to have undermining for about 4 cm from 12:00 to 4:00. I can still feel the bone in the base at about the 2 o'clock position, but it is less prominent and tissue is closing over. We have been using Santyl and Hydrofera Blue. The daughter that accompanies the patient today  indicates that they have been very diligent in making sure the [facility] is tucked under and into the full base of the wound. 02/05/2022: The wound overall is improved. The undermining is about the same but there is less bone palpable. We have been using Santyl and Hydrofera Blue. Minimal slough accumulation in the 2 weeks since her last visit. 02/19/2022: The visible wound surface is very clean without any slough accumulation. Although I can feel bone in the undermined portion of the wound, it is now covered with a layer of tissue. The undermined area is about the same size, however. We have been using Santyl with Hydrofera Blue. She has been approved for snap VAC use. 02/26/2022: Last week, we applied a snap VAC. The wound dimensions have come in by a bit in all directions. The VAC canister did fill, however, and was starting to leak so the patient's daughter removed the VAC and dressed the wound as we had been previously. There is a little bit of thin slough on the wound surface with some senescent tissue at the inferior margin. Bone is still palpable but covered with a layer of tissue. No odor. 03/06/2022: We continue to have technical difficulties with the snap VAC which resulted in the patient's family removing it and going back to the dressing changes with Hydrofera Blue. It sounds like there are issues maintaining suction and the drape is getting bunched up creating a leak. The wound measured larger today, but the intake nurse thinks that perhaps her measurements were taken slightly differently than last week's. There is just some thin slough on the wound surface as well as some hypertrophic granulation tissue on the lateral border. 03/13/2022: The snap VAC worked much better this week and only began leaking yesterday. At that point, the patient's family removed it and applied Hydrofera Blue to her wound. Today, the tissue coverage over the bone that had been exposed is more substantial. She still has undermining present. There is also some senescent skin around the  wound orifice along with some hypertrophic granulation tissue. 03/20/2022: The snap VAC unfortunately failed on Friday. There has really been no significant change to the wound for several weeks. The degree of undermining is unchanged. The surface is clean. 03/27/2022: Once again, the snap VAC failed prematurely and had to be removed on Saturday. The undermining remains the same. There is more tissue coverage of the previously exposed bone. The wound surface has just a light layer of biofilm and thin slough. She was approved for a standard wound VAC, but there are currently no home health agencies able to staff dressing changes for her; it is too difficult for her and her family to bring her to the wound center more than once a week. 04/03/2022: The snap VAC stayed on but the canister filled up on Sunday. The direct depth of the wound is a little bit shallower but the undermining has not really changed. There is some slightly discolored, darker tissue at the 9 o'clock position. No odor or purulent drainage. 04/11/2022: Using 2 canisters worked well and the snap VAC lasted until yesterday. She has her standard wound VAC with her today. The orifice of the wound has contracted somewhat. There is still substantial undermining at 12:00, but the undermined portion to the right between 2 and 4:00 has closed and somewhat. The previously exposed bone is no longer even palpable under the overlying tissue. No concern for infection. 04/17/2022: A standard VAC dressing was initiated last week. The family  has been changing it at home, due to the inability to secure home health services. It is a little bit challenging to apply, due to the small orifice of the wound with substantial undermining. Today, the undermining at 12:00 seems like it might be a little bit deeper, but the 2-4 o'clock undermining is decreased. There is excellent tissue coverage over the previously exposed bone. There is a little bit of  slough accumulation at the orifice, along with some senescent skin. 04/23/2022: The wound VAC was not changed all week due to the family being overwhelmed with other issues. Fortunately, there does not seem to have been any untoward effects from this. The orifice of the wound remains unchanged but the undermining and tunneling has narrowed to just 1 area at 12:00. No purulent drainage or odor. Good tissue coverage of the previously exposed bone. 04/30/2022: The tunneling has narrowed substantially. There is no purulent drainage or odor from the wound. The orifice of the wound is about the same size and there is some hypertrophic granulation tissue at the opening. 05/07/2022: The undermining has come in substantially. The only particularly deep area is from 1-2 o'clock. The rest of the wound is clean without any purulent drainage or odor. 05/14/2022: The orifice of the wound has contracted further. She continues to have just under 5 cm of tunneling at 12:00, but the wound is clean without any purulent drainage or odor. 05/21/2022: The tunnel has come in by about half a centimeter. The wound is clean without any slough. 05/28/2022: The tunnel continues to contract. The wound is clean with good granulation tissue at the base. No concern for infection. 06/04/2022: The tunnel is about the same length this week, but it is quite a bit narrower. The wound is clean with good granulation tissue. Unfortunately, her family applied the track pad directly over the sponge without any intervening drape and without bridging it away from her spine so she has a small ulcer from the track pad just caudal to the wound. 06/12/2022: The small ulcer from the track pad that was present last week has healed. Otherwise the wound is basically unchanged. 06/18/2022: No real change to the tunneling. The surface of the wound is clean. 06/25/2022: We discontinued the wound VAC last week and has been packing with iodoform packing strips. The  tunneling has come in by about half a centimeter. The wound is clean. Electronic Signature(s) Signed: 06/25/2022 12:18:55 PM By: Fredirick Maudlin MD FACS Entered By: Fredirick Maudlin on 06/25/2022 12:18:55 -------------------------------------------------------------------------------- Physical Exam Details Patient Name: Date of Service: Stacy Like B. 06/25/2022 11:00 A M Medical Record Number: [id number] Patient Account Number: [id number] Date of Birth/Sex: Treating RN: 1923/08/25 (88 y.o. Elam Dutch Primary Care Provider: [name] Other Clinician: Referring Provider: Treating Provider/Extender: Nicholaus Corolla in Treatment: 26 Constitutional . . . . No acute distress.Marland Kitchen Respiratory Normal work of breathing on room air.. Notes 06/25/2022: The tunneling has come in by about half a centimeter. The wound is clean. Electronic Signature(s) Signed: 06/25/2022 12:19:31 PM By: Fredirick Maudlin MD FACS Entered By: Fredirick Maudlin on 06/25/2022 12:19:31 -------------------------------------------------------------------------------- Physician Orders Details Patient Name: Date of Service: Stacy Like B. 06/25/2022 11:00 A M Medical Record Number: [id number] Patient Account Number: [id number] Date of Birth/Sex: Treating RN: October 06, 1923 (88 y.o. Elam Dutch Primary Care Provider: [name] Other Clinician: Referring Provider: Treating Provider/Extender: Nicholaus Corolla in Treatment: 26 Verbal / Phone Orders: No Diagnosis Coding ICD-10 Coding Code Description  L89.104 Pressure ulcer of unspecified part of back, stage 4 E11.622 Type 2 diabetes mellitus with other skin ulcer S22.089S Unspecified fracture of T11-T12 vertebra, sequela Follow-up Appointments ppointment in 1 week. - Dr Celine Ahr - Room 1 - with Vaughan Basta Return A Anesthetic Wound #1 Thoracic spine (In clinic) Topical Lidocaine 4% applied to wound  bed Bathing/ Shower/ Hygiene May shower with protection but do not get wound dressing(s) wet. Negative Presssure Wound Therapy Wound Vac to wound continuously at 141m/hg pressure - Hold VAC this week Black Foam Off-Loading Turn and reposition every 2 hours Other: - KEEP PRESSURE OFF OF BACK MAY USE EGG CRATE CUSHION Additional Orders / Instructions Follow Nutritious Diet Wound Treatment Wound #1 - Thoracic spine Cleanser: Wound Cleanser (Generic) Every Other Day/30 Days Discharge Instructions: Cleanse the wound with wound cleanser prior to applying a clean dressing using gauze sponges, not tissue or cotton balls. Peri-Wound Care: Skin Prep Every Other Day/30 Days Discharge Instructions: Use skin prep as directed Prim Dressing: Iodoform packing strip 1/2 (in) Every Other Day/30 Days ary Discharge Instructions: Lightly pack as instructed Secondary Dressing: Zetuvit Plus Silicone Border Dressing 4x4 (in/in) (Dispense As Written) Every Other Day/30 Days Discharge Instructions: Apply silicone border over primary dressing as directed. Electronic Signature(s) Signed: 06/25/2022 12:49:39 PM By: CFredirick MaudlinMD FACS Entered By: CFredirick Maudlinon 06/25/2022 12:29:20 -------------------------------------------------------------------------------- Problem List Details Patient Name: Date of Service: HJim LikeB. 06/25/2022 11:00 A M Medical Record Number: [id number] Account Number: [id number] of Birth/Sex: Treating RN: [birth year](88y.o. FElam DutchPrimary Care Provider: RJanace LittenOther Clinician: Referring Provider: Treating Provider/Extender: CNicholaus Corollain Treatment: 26 Active Problems ICD-10 Encounter Code Description Active Date MDM Diagnosis L89.104 Pressure ulcer of unspecified part of back, stage 4 12/19/2021 No Yes E11.622 Type 2 diabetes mellitus with other skin ulcer 12/19/2021 No Yes S22.089S Unspecified  fracture of T11-T12 vertebra, sequela 12/19/2021 No Yes Inactive Problems Resolved Problems Electronic Signature(s) Signed: 06/25/2022 12:18:07 PM By: CFredirick MaudlinMD FACS Entered By: CFredirick Maudlinon 06/25/2022 12:18:07 -------------------------------------------------------------------------------- Progress Note Details Patient Name: Date of Service: HJim LikeB. 06/25/2022 11:00 A M Medical Record Number: [id number] Account Number: [id number] of Birth/Sex: Treating RN: [birth year](88y.o. FElam DutchPrimary Care Provider: RJanace LittenOther Clinician: Referring Provider: Treating Provider/Extender: CNicholaus Corollain Treatment: 26 Subjective Chief Complaint Information obtained from Patient Patient is at the clinic for treatment of an open pressure ulcer History of Present Illness (HPI) ADMISSION 12/19/21 This is a 88year old woman who is presenting to clinic today with a an open ulcer over her thoracic spine. She has severe kyphosis and a history of prior T12 fracture, as well as type 2 diabetes mellitus. The wound has been present for about a year. She is accompanied by her daughters, 1 of whom is a nMarine scientist They have been trying to offload the site by using pillows and supports to keep returned while in bed, and an eggcrate foam with a cut out for the wound for when she is sitting up. They have been applying Santyl to the site. She does have home health and the provider took a culture which was positive for Pseudomonas. She was on ciprofloxacin for this. A recent repeat culture was negative. Most recent hemoglobin A1c was 6.5, in February. A C-reactive protein also obtained in February was elevated at 24.1. No imaging has been performed of the site. The patient denies significant pain. 12/25/2021: Last  week, I took a bone biopsy as well as culture. Fortunately, the bone biopsy was negative for osteomyelitis. The culture  returned with rare corynebacterium and rare Enterococcus faecalis. We have been using Santyl and Hydrofera Blue. T oday, the wound appears cleaner and there are buds of granulation tissue forming around the perimeter. 01/01/2022: Due to the positive culture, we switched to topical gentamicin. Today, she has had a little bit of a slough accumulation. The wound continues to have buds of granulation tissue forming. Apparently, the Hydrofera Blue was placed just over the wound opening rather than down in to contact the surface. No purulent drainage or odor. 01/08/2022: Last week, there was more slough in the wound bed, so I changed back to Santyl. There is now hypertrophic granulation tissue around the wound opening. Once again, we seem to be having some difficulty getting the Hydrofera Blue tucked up into the wound; there is undermining that extends for 3 to 4 cm at the 12 o'clock position and the Hydrofera Blue is simply sitting on top of the wound without being in contact with the surface. Bone is still palpable, but tissue is beginning to close in over it. 01/22/2022: The wound looks much cleaner this week and the hypertrophic granulation tissue responded appropriately to silver nitrate application. She continues to have undermining for about 4 cm from 12:00 to 4:00. I can still feel the bone in the base at about the 2 o'clock position, but it is less prominent and tissue is closing over. We have been using Santyl and Hydrofera Blue. The daughter that accompanies the patient today indicates that they have been very diligent in making sure the [facility] is tucked under and into the full base of the wound. 02/05/2022: The wound overall is improved. The undermining is about the same but there is less bone palpable. We have been using Santyl and Hydrofera Blue. Minimal slough accumulation in the 2 weeks since her last visit. 02/19/2022: The visible wound surface is very clean without any slough  accumulation. Although I can feel bone in the undermined portion of the wound, it is now covered with a layer of tissue. The undermined area is about the same size, however. We have been using Santyl with Hydrofera Blue. She has been approved for snap VAC use. 02/26/2022: Last week, we applied a snap VAC. The wound dimensions have come in by a bit in all directions. The VAC canister did fill, however, and was starting to leak so the patient's daughter removed the VAC and dressed the wound as we had been previously. There is a little bit of thin slough on the wound surface with some senescent tissue at the inferior margin. Bone is still palpable but covered with a layer of tissue. No odor. 03/06/2022: We continue to have technical difficulties with the snap VAC which resulted in the patient's family removing it and going back to the dressing changes with Hydrofera Blue. It sounds like there are issues maintaining suction and the drape is getting bunched up creating a leak. The wound measured larger today, but the intake nurse thinks that perhaps her measurements were taken slightly differently than last week's. There is just some thin slough on the wound surface as well as some hypertrophic granulation tissue on the lateral border. 03/13/2022: The snap VAC worked much better this week and only began leaking yesterday. At that point, the patient's family removed it and applied Hydrofera Blue to her wound. Today, the tissue coverage over the bone that  had been exposed is more substantial. She still has undermining present. There is also some senescent skin around the wound orifice along with some hypertrophic granulation tissue. 03/20/2022: The snap VAC unfortunately failed on Friday. There has really been no significant change to the wound for several weeks. The degree of undermining is unchanged. The surface is clean. 03/27/2022: Once again, the snap VAC failed prematurely and had to be removed on Saturday.  The undermining remains the same. There is more tissue coverage of the previously exposed bone. The wound surface has just a light layer of biofilm and thin slough. She was approved for a standard wound VAC, but there are currently no home health agencies able to staff dressing changes for her; it is too difficult for her and her family to bring her to the wound center more than once a week. 04/03/2022: The snap VAC stayed on but the canister filled up on Sunday. The direct depth of the wound is a little bit shallower but the undermining has not really changed. There is some slightly discolored, darker tissue at the 9 o'clock position. No odor or purulent drainage. 04/11/2022: Using 2 canisters worked well and the snap VAC lasted until yesterday. She has her standard wound VAC with her today. The orifice of the wound has contracted somewhat. There is still substantial undermining at 12:00, but the undermined portion to the right between 2 and 4:00 has closed and somewhat. The previously exposed bone is no longer even palpable under the overlying tissue. No concern for infection. 04/17/2022: A standard VAC dressing was initiated last week. The family has been changing it at home, due to the inability to secure home health services. It is a little bit challenging to apply, due to the small orifice of the wound with substantial undermining. Today, the undermining at 12:00 seems like it might be a little bit deeper, but the 2-4 o'clock undermining is decreased. There is excellent tissue coverage over the previously exposed bone. There is a little bit of slough accumulation at the orifice, along with some senescent skin. 04/23/2022: The wound VAC was not changed all week due to the family being overwhelmed with other issues. Fortunately, there does not seem to have been any untoward effects from this. The orifice of the wound remains unchanged but the undermining and tunneling has narrowed to just 1 area at  12:00. No purulent drainage or odor. Good tissue coverage of the previously exposed bone. 04/30/2022: The tunneling has narrowed substantially. There is no purulent drainage or odor from the wound. The orifice of the wound is about the same size and there is some hypertrophic granulation tissue at the opening. 05/07/2022: The undermining has come in substantially. The only particularly deep area is from 1-2 o'clock. The rest of the wound is clean without any purulent drainage or odor. 05/14/2022: The orifice of the wound has contracted further. She continues to have just under 5 cm of tunneling at 12:00, but the wound is clean without any purulent drainage or odor. 05/21/2022: The tunnel has come in by about half a centimeter. The wound is clean without any slough. 05/28/2022: The tunnel continues to contract. The wound is clean with good granulation tissue at the base. No concern for infection. 06/04/2022: The tunnel is about the same length this week, but it is quite a bit narrower. The wound is clean with good granulation tissue. Unfortunately, her family applied the track pad directly over the sponge without any intervening drape and without bridging it  away from her spine so she has a small ulcer from the track pad just caudal to the wound. 06/12/2022: The small ulcer from the track pad that was present last week has healed. Otherwise the wound is basically unchanged. 06/18/2022: No real change to the tunneling. The surface of the wound is clean. 06/25/2022: We discontinued the wound VAC last week and has been packing with iodoform packing strips. The tunneling has come in by about half a centimeter. The wound is clean. Patient History Family History Unknown History. Social History Never smoker, Marital Status - Married, Alcohol Use - Never, Drug Use - No History, Caffeine Use - Rarely. Medical History Ear/Nose/Mouth/Throat Denies history of Chronic sinus problems/congestion, Middle ear  problems Cardiovascular Patient has history of Hypertension Endocrine Patient has history of Type II Diabetes Immunological Denies history of Lupus Erythematosus, Raynaudoos, Scleroderma Oncologic Denies history of Received Chemotherapy, Received Radiation Hospitalization/Surgery History - t/11 t/12 FRACTURE/SURGERY. - FEMUR FX SURGERY. - TOTAL HYSTERECTOMY. - CHOLECYSTECTOMY. - TONSILLECTOMY. Medical A Surgical History Notes nd Ear/Nose/Mouth/Throat PARTIAL HEARING LEFT HEAR W/ HEARING AID TOTAL DEAFNESS RIGHT EAR Gastrointestinal Melena Endocrine Hypothyroidism Hyperlipidemia associated with type 2 diabetes mellitus (HCC) Musculoskeletal Closed T12 fracture (HCC) Hip fracture (HCC) Closed comminuted intertrochanteric fracture of proximal end of right femur (HCC) Objective Constitutional No acute distress.. Vitals Time Taken: 11:55 AM, Height: 62 in, Weight: 110 lbs, BMI: 20.1, Temperature: 97.7 F, Pulse: 80 bpm, Respiratory Rate: 18 breaths/min, Blood Pressure: 117/71 mmHg. Respiratory Normal work of breathing on room air.. General Notes: 06/25/2022: The tunneling has come in by about half a centimeter. The wound is clean. Integumentary (Hair, Skin) Wound #1 status is Open. Original cause of wound was Gradually Appeared. The date acquired was: 12/19/2020. The wound has been in treatment 26 weeks. The wound is located on the Thoracic spine. The wound measures 1.4cm length x 1.1cm width x 0.5cm depth; 1.21cm^2 area and 0.605cm^3 volume. There is Fat Layer (Subcutaneous Tissue) exposed. There is no tunneling noted, however, there is undermining starting at 12:00 and ending at 3:00 with a maximum distance of 4.2cm. There is a medium amount of serosanguineous drainage noted. The wound margin is well defined and not attached to the wound base. There is large (67-100%) red, pink granulation within the wound bed. There is a small (1-33%) amount of necrotic tissue within the wound bed  including Adherent Slough. Assessment Active Problems ICD-10 Pressure ulcer of unspecified part of back, stage 4 Type 2 diabetes mellitus with other skin ulcer Unspecified fracture of T11-T12 vertebra, sequela Plan Follow-up Appointments: Return Appointment in 1 week. - Dr Celine Ahr - Room 1 - with Vaughan Basta Anesthetic: Wound #1 Thoracic spine: (In clinic) Topical Lidocaine 4% applied to wound bed Bathing/ Shower/ Hygiene: May shower with protection but do not get wound dressing(s) wet. Negative Presssure Wound Therapy: Wound Vac to wound continuously at 186m/hg pressure - Hold VAC this week Black Foam Off-Loading: Turn and reposition every 2 hours Other: - KEEP PRESSURE OFF OF BACK MAY USE EGG CRATE CUSHION Additional Orders / Instructions: Follow Nutritious Diet WOUND #1: - Thoracic spine Wound Laterality: Cleanser: Wound Cleanser (Generic) Every Other Day/30 Days Discharge Instructions: Cleanse the wound with wound cleanser prior to applying a clean dressing using gauze sponges, not tissue or cotton balls. Peri-Wound Care: Skin Prep Every Other Day/30 Days Discharge Instructions: Use skin prep as directed Prim Dressing: Iodoform packing strip 1/2 (in) Every Other Day/30 Days ary Discharge Instructions: Lightly pack as instructed Secondary Dressing: Zetuvit Plus Silicone  Border Dressing 4x4 (in/in) (Dispense As Written) Every Other Day/30 Days Discharge Instructions: Apply silicone border over primary dressing as directed. 06/25/2022: The tunneling has come in by about half a centimeter. The wound is clean. No debridement was necessary today. We will continue to pack the wound with iodoform packing strips. Follow-up in 1 week. Electronic Signature(s) Signed: 06/25/2022 12:29:44 PM By: Fredirick Maudlin MD FACS Entered By: Fredirick Maudlin on 06/25/2022 12:29:43 -------------------------------------------------------------------------------- HxROS Details Patient Name: Date of  Service: Stacy Like B. 06/25/2022 11:00 A M Medical Record Number: [id number] Patient Account Number: [id number] Date of Birth/Sex: Treating RN: 01/30/1923 (88 y.o. Elam Dutch Primary Care Provider: [name] Other Clinician: Referring Provider: Treating Provider/Extender: Nicholaus Corolla in Treatment: 26 Ear/Nose/Mouth/Throat Medical History: Negative for: Chronic sinus problems/congestion; Middle ear problems Past Medical History Notes: PARTIAL HEARING LEFT HEAR W/ HEARING AID TOTAL DEAFNESS RIGHT EAR Cardiovascular Medical History: Positive for: Hypertension Gastrointestinal Medical History: Past Medical History Notes: Melena Endocrine Medical History: Positive for: Type II Diabetes Past Medical History Notes: Hypothyroidism Hyperlipidemia associated with type 2 diabetes mellitus ([place]) Treated with: Diet Immunological Medical History: Negative for: Lupus Erythematosus; Raynauds; Scleroderma Musculoskeletal Medical History: Past Medical History Notes: Closed T12 fracture ([place]) Hip fracture ([place]) Closed comminuted intertrochanteric fracture of proximal end of right femur [facility]) Oncologic Medical History: Negative for: Received Chemotherapy; Received Radiation Immunizations Pneumococcal Vaccine: Received Pneumococcal Vaccination: Yes Received Pneumococcal Vaccination On or After 60th Birthday: No Implantable Devices None Hospitalization / Surgery History Type of Hospitalization/Surgery t/11 t/12 FRACTURE/SURGERY FEMUR FX SURGERY TOTAL HYSTERECTOMY CHOLECYSTECTOMY TONSILLECTOMY Family and Social History Unknown History: Yes; Never smoker; Marital Status - Married; Alcohol Use: Never; Drug Use: No History; Caffeine Use: Rarely; Financial Concerns: No; Food, Clothing or Shelter Needs: No; Support System Lacking: No; Transportation Concerns: No Electronic Signature(s) Signed: 06/25/2022 12:49:39 PM By: Fredirick Maudlin MD  FACS Signed: 06/25/2022 6:17:04 PM By: Baruch Gouty RN, BSN Entered By: Fredirick Maudlin on 06/25/2022 12:19:00 -------------------------------------------------------------------------------- SuperBill Details Patient Name: Date of Service: Stacy Like B. 06/25/2022 Medical Record Number: [id number] Patient Account Number: [id number] Date of Birth/Sex: Treating RN: 06/09/1923 (88 y.o. Elam Dutch Primary Care Provider: [name] Other Clinician: Referring Provider: Treating Provider/Extender: Nicholaus Corolla in Treatment: 26 Diagnosis Coding ICD-10 Codes Code Description L89.104 Pressure ulcer of unspecified part of back, stage 4 E11.622 Type 2 diabetes mellitus with other skin ulcer S22.089S Unspecified fracture of T11-T12 vertebra, sequela Facility Procedures CPT4 Code: [id number] Description: 99213 - WOUND CARE VISIT-LEV 3 EST PT Modifier: Quantity: 1 Physician Procedures : CPT4 Code Description Modifier [id number] - WC PHYS LEVEL 4 - EST PT ICD-10 Diagnosis Description L89.104 Pressure ulcer of unspecified part of back, stage 4 E11.622 Type 2 diabetes mellitus with other skin ulcer S22.089S Unspecified fracture of  T11-T12 vertebra, sequela Quantity: 1 Electronic Signature(s) Signed: 06/25/2022 1:40:49 PM By: Fredirick Maudlin MD FACS Signed: 06/25/2022 6:17:04 PM By: Baruch Gouty RN, BSN Previous Signature: 06/25/2022 12:29:57 PM Version By: Fredirick Maudlin MD FACS Entered By: Baruch Gouty on 06/25/2022 13:08:44

## 2022-07-02 ENCOUNTER — Encounter (HOSPITAL_BASED_OUTPATIENT_CLINIC_OR_DEPARTMENT_OTHER): Payer: Medicare Other | Admitting: General Surgery

## 2022-07-02 DIAGNOSIS — L89104 Pressure ulcer of unspecified part of back, stage 4: Secondary | ICD-10-CM | POA: Diagnosis not present

## 2022-07-02 NOTE — Progress Notes (Signed)
TANYIA, GRABBE ([id number]) Visit Report for 07/02/2022 Chief Complaint Document Details Patient Name: Date of Service: Stacy, Perry 07/02/2022 2:45 PM Medical Record Number: [id number] Patient Account Number: [id number] Date of Birth/Sex: Treating RN: [birth year] (88 y.o. F) Primary Care Provider: [name] Other Clinician: Referring Provider: Treating Provider/Extender: Nicholaus Corolla in Treatment: 27 Information Obtained from: Patient Chief Complaint Patient is at the clinic for treatment of an open pressure ulcer Electronic Signature(s) Signed: 07/02/2022 3:54:18 PM By: Fredirick Maudlin MD FACS Entered By: Fredirick Maudlin on 07/02/2022 15:54:17 -------------------------------------------------------------------------------- HPI Details Patient Name: Date of Service: Stacy Like B. 07/02/2022 2:45 PM Medical Record Number: [id number] Patient Account Number: [id number] Date of Birth/Sex: Treating RN: [birth year] (88 y.o. F) Primary Care Provider: [name] Other Clinician: Referring Provider: Treating Provider/Extender: Nicholaus Corolla in Treatment: 27 History of Present Illness HPI Description: ADMISSION 12/19/21 This is a 88 year old woman who is presenting to clinic today with a an open ulcer over her thoracic spine. She has severe kyphosis and a history of prior T12 fracture, as well as type 2 diabetes mellitus. The wound has been present for about a year. She is accompanied by her daughters, 1 of whom is a Marine scientist. They have been trying to offload the site by using pillows and supports to keep returned while in bed, and an eggcrate foam with a cut out for the wound for when she is sitting up. They have been applying Santyl to the site. She does have home health and the provider took a culture which was positive for Pseudomonas. She was on ciprofloxacin for this. A recent repeat culture was negative. Most recent  hemoglobin A1c was 6.5, in February. A C-reactive protein also obtained in February was elevated at 24.1. No imaging has been performed of the site. The patient denies significant pain. 12/25/2021: Last week, I took a bone biopsy as well as culture. Fortunately, the bone biopsy was negative for osteomyelitis. The culture returned with rare corynebacterium and rare Enterococcus faecalis. We have been using Santyl and Hydrofera Blue. T oday, the wound appears cleaner and there are buds of granulation tissue forming around the perimeter. 01/01/2022: Due to the positive culture, we switched to topical gentamicin. Today, she has had a little bit of a slough accumulation. The wound continues to have buds of granulation tissue forming. Apparently, the Hydrofera Blue was placed just over the wound opening rather than down in to contact the surface. No purulent drainage or odor. 01/08/2022: Last week, there was more slough in the wound bed, so I changed back to Santyl. There is now hypertrophic granulation tissue around the wound opening. Once again, we seem to be having some difficulty getting the Hydrofera Blue tucked up into the wound; there is undermining that extends for 3 to 4 cm at the 12 o'clock position and the Hydrofera Blue is simply sitting on top of the wound without being in contact with the surface. Bone is still palpable, but tissue is beginning to close in over it. 01/22/2022: The wound looks much cleaner this week and the hypertrophic granulation tissue responded appropriately to silver nitrate application. She continues to have undermining for about 4 cm from 12:00 to 4:00. I can still feel the bone in the base at about the 2 o'clock position, but it is less prominent and tissue is closing over. We have been using Santyl and Hydrofera Blue. The daughter that accompanies the patient today indicates that they have been very  diligent in making sure the [facility] is tucked under and into the  full base of the wound. 02/05/2022: The wound overall is improved. The undermining is about the same but there is less bone palpable. We have been using Santyl and Hydrofera Blue. Minimal slough accumulation in the 2 weeks since her last visit. 02/19/2022: The visible wound surface is very clean without any slough accumulation. Although I can feel bone in the undermined portion of the wound, it is now covered with a layer of tissue. The undermined area is about the same size, however. We have been using Santyl with Hydrofera Blue. She has been approved for snap VAC use. 02/26/2022: Last week, we applied a snap VAC. The wound dimensions have come in by a bit in all directions. The VAC canister did fill, however, and was starting to leak so the patient's daughter removed the VAC and dressed the wound as we had been previously. There is a little bit of thin slough on the wound surface with some senescent tissue at the inferior margin. Bone is still palpable but covered with a layer of tissue. No odor. 03/06/2022: We continue to have technical difficulties with the snap VAC which resulted in the patient's family removing it and going back to the dressing changes with Hydrofera Blue. It sounds like there are issues maintaining suction and the drape is getting bunched up creating a leak. The wound measured larger today, but the intake nurse thinks that perhaps her measurements were taken slightly differently than last week's. There is just some thin slough on the wound surface as well as some hypertrophic granulation tissue on the lateral border. 03/13/2022: The snap VAC worked much better this week and only began leaking yesterday. At that point, the patient's family removed it and applied Hydrofera Blue to her wound. Today, the tissue coverage over the bone that had been exposed is more substantial. She still has undermining present. There is also some senescent skin around the wound orifice along with some  hypertrophic granulation tissue. 03/20/2022: The snap VAC unfortunately failed on Friday. There has really been no significant change to the wound for several weeks. The degree of undermining is unchanged. The surface is clean. 03/27/2022: Once again, the snap VAC failed prematurely and had to be removed on Saturday. The undermining remains the same. There is more tissue coverage of the previously exposed bone. The wound surface has just a light layer of biofilm and thin slough. She was approved for a standard wound VAC, but there are currently no home health agencies able to staff dressing changes for her; it is too difficult for her and her family to bring her to the wound center more than once a week. 04/03/2022: The snap VAC stayed on but the canister filled up on Sunday. The direct depth of the wound is a little bit shallower but the undermining has not really changed. There is some slightly discolored, darker tissue at the 9 o'clock position. No odor or purulent drainage. 04/11/2022: Using 2 canisters worked well and the snap VAC lasted until yesterday. She has her standard wound VAC with her today. The orifice of the wound has contracted somewhat. There is still substantial undermining at 12:00, but the undermined portion to the right between 2 and 4:00 has closed and somewhat. The previously exposed bone is no longer even palpable under the overlying tissue. No concern for infection. 04/17/2022: A standard VAC dressing was initiated last week. The family has been changing it at home,  due to the inability to secure home health services. It is a little bit challenging to apply, due to the small orifice of the wound with substantial undermining. Today, the undermining at 12:00 seems like it might be a little bit deeper, but the 2-4 o'clock undermining is decreased. There is excellent tissue coverage over the previously exposed bone. There is a little bit of slough accumulation at the orifice, along  with some senescent skin. 04/23/2022: The wound VAC was not changed all week due to the family being overwhelmed with other issues. Fortunately, there does not seem to have been any untoward effects from this. The orifice of the wound remains unchanged but the undermining and tunneling has narrowed to just 1 area at 12:00. No purulent drainage or odor. Good tissue coverage of the previously exposed bone. 04/30/2022: The tunneling has narrowed substantially. There is no purulent drainage or odor from the wound. The orifice of the wound is about the same size and there is some hypertrophic granulation tissue at the opening. 05/07/2022: The undermining has come in substantially. The only particularly deep area is from 1-2 o'clock. The rest of the wound is clean without any purulent drainage or odor. 05/14/2022: The orifice of the wound has contracted further. She continues to have just under 5 cm of tunneling at 12:00, but the wound is clean without any purulent drainage or odor. 05/21/2022: The tunnel has come in by about half a centimeter. The wound is clean without any slough. 05/28/2022: The tunnel continues to contract. The wound is clean with good granulation tissue at the base. No concern for infection. 06/04/2022: The tunnel is about the same length this week, but it is quite a bit narrower. The wound is clean with good granulation tissue. Unfortunately, her family applied the track pad directly over the sponge without any intervening drape and without bridging it away from her spine so she has a small ulcer from the track pad just caudal to the wound. 06/12/2022: The small ulcer from the track pad that was present last week has healed. Otherwise the wound is basically unchanged. 06/18/2022: No real change to the tunneling. The surface of the wound is clean. 06/25/2022: We discontinued the wound VAC last week and has been packing with iodoform packing strips. The tunneling has come in by about half  a centimeter. The wound is clean. 07/02/2022: There has really been no change to the wound over the past week. There is a little hypertrophic granulation tissue at the orifice but otherwise the tunneling is about the same and the wound is clean. Electronic Signature(s) Signed: 07/02/2022 3:54:49 PM By: Fredirick Maudlin MD FACS Entered By: Fredirick Maudlin on 07/02/2022 15:54:49 -------------------------------------------------------------------------------- Chemical Cauterization Details Patient Name: Date of Service: [name] 07/02/2022 2:45 PM Medical Record Number: [id number] Patient Account Number: [id number] Date of Birth/Sex: Treating RN: [birth year] (88 y.o. Elam Dutch Primary Care Provider: [name] Other Clinician: Referring Provider: Treating Provider/Extender: Nicholaus Corolla in Treatment: 27 Procedure Performed for: Wound #1 Thoracic spine Performed By: Physician Fredirick Maudlin, MD Post Procedure Diagnosis Same as Pre-procedure Notes using silver nitrate stick Electronic Signature(s) Signed: 07/02/2022 4:13:56 PM By: Fredirick Maudlin MD FACS Signed: 07/02/2022 5:20:46 PM By: Baruch Gouty RN, BSN Entered By: Baruch Gouty on 07/02/2022 15:52:20 -------------------------------------------------------------------------------- Physical Exam Details Patient Name: Date of Service: [name] 07/02/2022 2:45 PM Medical Record Number: [id number] Patient Account Number: [id number] Date of Birth/Sex: Treating RN: [birth year] (88 y.o. F) Primary Care  Provider: [name] Other Clinician: Referring Provider: Treating Provider/Extender: Nicholaus Corolla in Treatment: 27 Constitutional . . . . No acute distress.Marland Kitchen Respiratory Normal work of breathing on room air.. Notes 07/02/2022: There has really been no change to the wound over the past week. There is a little hypertrophic granulation tissue at  the orifice but otherwise the tunneling is about the same and the wound is clean. Electronic Signature(s) Signed: 07/02/2022 3:55:18 PM By: Fredirick Maudlin MD FACS Entered By: Fredirick Maudlin on 07/02/2022 15:55:18 -------------------------------------------------------------------------------- Physician Orders Details Patient Name: Date of Service: [name] 07/02/2022 2:45 PM Medical Record Number: [id number] Patient Account Number: [id number] Date of Birth/Sex: Treating RN: 02-08-1923 (88 y.o. Elam Dutch Primary Care Provider: [name] Other Clinician: Referring Provider: Treating Provider/Extender: Nicholaus Corolla in Treatment: 27 Verbal / Phone Orders: No Diagnosis Coding ICD-10 Coding Code Description L89.104 Pressure ulcer of unspecified part of back, stage 4 E11.622 Type 2 diabetes mellitus with other skin ulcer S22.089S Unspecified fracture of T11-T12 vertebra, sequela Follow-up Appointments ppointment in 2 weeks. - Dr Celine Ahr - Room 1 - with Vaughan Basta Return A Anesthetic Wound #1 Thoracic spine (In clinic) Topical Lidocaine 4% applied to wound bed Bathing/ Shower/ Hygiene May shower with protection but do not get wound dressing(s) wet. Negative Presssure Wound Therapy Discontinue wound vac Off-Loading Turn and reposition every 2 hours Other: - KEEP PRESSURE OFF OF BACK MAY USE EGG CRATE CUSHION Additional Orders / Instructions Follow Nutritious Diet Wound Treatment Wound #1 - Thoracic spine Cleanser: Wound Cleanser (Generic) 1 x Per Day/30 Days Discharge Instructions: Cleanse the wound with wound cleanser prior to applying a clean dressing using gauze sponges, not tissue or cotton balls. Peri-Wound Care: Skin Prep 1 x Per Day/30 Days Discharge Instructions: Use skin prep as directed Prim Dressing: Iodoform packing strip 1/2 (in) 1 x Per Day/30 Days ary Discharge Instructions: Lightly pack into wound especially into  underminig from 12-3 o'clock Secondary Dressing: Zetuvit Plus Silicone Border Dressing 4x4 (in/in) (Dispense As Written) 1 x Per Day/30 Days Discharge Instructions: Apply silicone border over primary dressing as directed. Electronic Signature(s) Signed: 07/02/2022 4:13:56 PM By: Fredirick Maudlin MD FACS Entered By: Fredirick Maudlin on 07/02/2022 15:55:32 -------------------------------------------------------------------------------- Problem List Details Patient Name: Date of Service: [name] 07/02/2022 2:45 PM Medical Record Number: [id number] Patient Account Number: [id number] Date of Birth/Sex: Treating RN: [birth year] (88 y.o. Elam Dutch Primary Care Provider: [name] Other Clinician: Referring Provider: Treating Provider/Extender: Nicholaus Corolla in Treatment: 27 Active Problems ICD-10 Encounter Code Description Active Date MDM Diagnosis L89.104 Pressure ulcer of unspecified part of back, stage 4 12/19/2021 No Yes E11.622 Type 2 diabetes mellitus with other skin ulcer 12/19/2021 No Yes S22.089S Unspecified fracture of T11-T12 vertebra, sequela 12/19/2021 No Yes Inactive Problems Resolved Problems Electronic Signature(s) Signed: 07/02/2022 3:54:08 PM By: Fredirick Maudlin MD FACS Entered By: Fredirick Maudlin on 07/02/2022 15:54:08 -------------------------------------------------------------------------------- Progress Note Details Patient Name: Date of Service: Stacy Like B. 07/02/2022 2:45 PM Medical Record Number: [id number] Patient Account Number: [id number] Date of Birth/Sex: Treating RN: [birth year] (88 y.o. F) Primary Care Provider: [name] Other Clinician: Referring Provider: Treating Provider/Extender: Nicholaus Corolla in Treatment: 27 Subjective Chief Complaint Information obtained from Patient Patient is at the clinic for treatment of an open pressure ulcer History of Present Illness  (HPI) ADMISSION 12/19/21 This is a 88 year old woman who is presenting to clinic today with a an open ulcer  over her thoracic spine. She has severe kyphosis and a history of prior T12 fracture, as well as type 2 diabetes mellitus. The wound has been present for about a year. She is accompanied by her daughters, 1 of whom is a Marine scientist. They have been trying to offload the site by using pillows and supports to keep returned while in bed, and an eggcrate foam with a cut out for the wound for when she is sitting up. They have been applying Santyl to the site. She does have home health and the provider took a culture which was positive for Pseudomonas. She was on ciprofloxacin for this. A recent repeat culture was negative. Most recent hemoglobin A1c was 6.5, in February. A C-reactive protein also obtained in February was elevated at 24.1. No imaging has been performed of the site. The patient denies significant pain. 12/25/2021: Last week, I took a bone biopsy as well as culture. Fortunately, the bone biopsy was negative for osteomyelitis. The culture returned with rare corynebacterium and rare Enterococcus faecalis. We have been using Santyl and Hydrofera Blue. T oday, the wound appears cleaner and there are buds of granulation tissue forming around the perimeter. 01/01/2022: Due to the positive culture, we switched to topical gentamicin. Today, she has had a little bit of a slough accumulation. The wound continues to have buds of granulation tissue forming. Apparently, the Hydrofera Blue was placed just over the wound opening rather than down in to contact the surface. No purulent drainage or odor. 01/08/2022: Last week, there was more slough in the wound bed, so I changed back to Santyl. There is now hypertrophic granulation tissue around the wound opening. Once again, we seem to be having some difficulty getting the Hydrofera Blue tucked up into the wound; there is undermining that extends for 3 to 4 cm at  the 12 o'clock position and the Hydrofera Blue is simply sitting on top of the wound without being in contact with the surface. Bone is still palpable, but tissue is beginning to close in over it. 01/22/2022: The wound looks much cleaner this week and the hypertrophic granulation tissue responded appropriately to silver nitrate application. She continues to have undermining for about 4 cm from 12:00 to 4:00. I can still feel the bone in the base at about the 2 o'clock position, but it is less prominent and tissue is closing over. We have been using Santyl and Hydrofera Blue. The daughter that accompanies the patient today indicates that they have been very diligent in making sure the [facility] is tucked under and into the full base of the wound. 02/05/2022: The wound overall is improved. The undermining is about the same but there is less bone palpable. We have been using Santyl and Hydrofera Blue. Minimal slough accumulation in the 2 weeks since her last visit. 02/19/2022: The visible wound surface is very clean without any slough accumulation. Although I can feel bone in the undermined portion of the wound, it is now covered with a layer of tissue. The undermined area is about the same size, however. We have been using Santyl with Hydrofera Blue. She has been approved for snap VAC use. 02/26/2022: Last week, we applied a snap VAC. The wound dimensions have come in by a bit in all directions. The VAC canister did fill, however, and was starting to leak so the patient's daughter removed the VAC and dressed the wound as we had been previously. There is a little bit of thin slough on the wound  surface with some senescent tissue at the inferior margin. Bone is still palpable but covered with a layer of tissue. No odor. 03/06/2022: We continue to have technical difficulties with the snap VAC which resulted in the patient's family removing it and going back to the dressing changes with Hydrofera Blue. It  sounds like there are issues maintaining suction and the drape is getting bunched up creating a leak. The wound measured larger today, but the intake nurse thinks that perhaps her measurements were taken slightly differently than last week's. There is just some thin slough on the wound surface as well as some hypertrophic granulation tissue on the lateral border. 03/13/2022: The snap VAC worked much better this week and only began leaking yesterday. At that point, the patient's family removed it and applied Hydrofera Blue to her wound. Today, the tissue coverage over the bone that had been exposed is more substantial. She still has undermining present. There is also some senescent skin around the wound orifice along with some hypertrophic granulation tissue. 03/20/2022: The snap VAC unfortunately failed on Friday. There has really been no significant change to the wound for several weeks. The degree of undermining is unchanged. The surface is clean. 03/27/2022: Once again, the snap VAC failed prematurely and had to be removed on Saturday. The undermining remains the same. There is more tissue coverage of the previously exposed bone. The wound surface has just a light layer of biofilm and thin slough. She was approved for a standard wound VAC, but there are currently no home health agencies able to staff dressing changes for her; it is too difficult for her and her family to bring her to the wound center more than once a week. 04/03/2022: The snap VAC stayed on but the canister filled up on Sunday. The direct depth of the wound is a little bit shallower but the undermining has not really changed. There is some slightly discolored, darker tissue at the 9 o'clock position. No odor or purulent drainage. 04/11/2022: Using 2 canisters worked well and the snap VAC lasted until yesterday. She has her standard wound VAC with her today. The orifice of the wound has contracted somewhat. There is still substantial  undermining at 12:00, but the undermined portion to the right between 2 and 4:00 has closed and somewhat. The previously exposed bone is no longer even palpable under the overlying tissue. No concern for infection. 04/17/2022: A standard VAC dressing was initiated last week. The family has been changing it at home, due to the inability to secure home health services. It is a little bit challenging to apply, due to the small orifice of the wound with substantial undermining. Today, the undermining at 12:00 seems like it might be a little bit deeper, but the 2-4 o'clock undermining is decreased. There is excellent tissue coverage over the previously exposed bone. There is a little bit of slough accumulation at the orifice, along with some senescent skin. 04/23/2022: The wound VAC was not changed all week due to the family being overwhelmed with other issues. Fortunately, there does not seem to have been any untoward effects from this. The orifice of the wound remains unchanged but the undermining and tunneling has narrowed to just 1 area at 12:00. No purulent drainage or odor. Good tissue coverage of the previously exposed bone. 04/30/2022: The tunneling has narrowed substantially. There is no purulent drainage or odor from the wound. The orifice of the wound is about the same size and there is some hypertrophic granulation  tissue at the opening. 05/07/2022: The undermining has come in substantially. The only particularly deep area is from 1-2 o'clock. The rest of the wound is clean without any purulent drainage or odor. 05/14/2022: The orifice of the wound has contracted further. She continues to have just under 5 cm of tunneling at 12:00, but the wound is clean without any purulent drainage or odor. 05/21/2022: The tunnel has come in by about half a centimeter. The wound is clean without any slough. 05/28/2022: The tunnel continues to contract. The wound is clean with good granulation tissue at the base. No  concern for infection. 06/04/2022: The tunnel is about the same length this week, but it is quite a bit narrower. The wound is clean with good granulation tissue. Unfortunately, her family applied the track pad directly over the sponge without any intervening drape and without bridging it away from her spine so she has a small ulcer from the track pad just caudal to the wound. 06/12/2022: The small ulcer from the track pad that was present last week has healed. Otherwise the wound is basically unchanged. 06/18/2022: No real change to the tunneling. The surface of the wound is clean. 06/25/2022: We discontinued the wound VAC last week and has been packing with iodoform packing strips. The tunneling has come in by about half a centimeter. The wound is clean. 07/02/2022: There has really been no change to the wound over the past week. There is a little hypertrophic granulation tissue at the orifice but otherwise the tunneling is about the same and the wound is clean. Patient History Family History Unknown History. Social History Never smoker, Marital Status - Married, Alcohol Use - Never, Drug Use - No History, Caffeine Use - Rarely. Medical History Ear/Nose/Mouth/Throat Denies history of Chronic sinus problems/congestion, Middle ear problems Cardiovascular Patient has history of Hypertension Endocrine Patient has history of Type II Diabetes Immunological Denies history of Lupus Erythematosus, Raynaudoos, Scleroderma Oncologic Denies history of Received Chemotherapy, Received Radiation Hospitalization/Surgery History - t/11 t/12 FRACTURE/SURGERY. - FEMUR FX SURGERY. - TOTAL HYSTERECTOMY. - CHOLECYSTECTOMY. - TONSILLECTOMY. Medical A Surgical History Notes nd Ear/Nose/Mouth/Throat PARTIAL HEARING LEFT HEAR W/ HEARING AID TOTAL DEAFNESS RIGHT EAR Gastrointestinal Melena Endocrine Hypothyroidism Hyperlipidemia associated with type 2 diabetes mellitus (HCC) Musculoskeletal Closed T12  fracture (HCC) Hip fracture (HCC) Closed comminuted intertrochanteric fracture of proximal end of right femur (HCC) Objective Constitutional No acute distress.. Vitals Time Taken: 3:35 PM, Height: 62 in, Weight: 110 lbs, BMI: 20.1, Temperature: 98.5 F, Pulse: 92 bpm, Respiratory Rate: 18 breaths/min, Blood Pressure: 134/76 mmHg. Respiratory Normal work of breathing on room air.. General Notes: 07/02/2022: There has really been no change to the wound over the past week. There is a little hypertrophic granulation tissue at the orifice but otherwise the tunneling is about the same and the wound is clean. Integumentary (Hair, Skin) Wound #1 status is Open. Original cause of wound was Gradually Appeared. The date acquired was: 12/19/2020. The wound has been in treatment 27 weeks. The wound is located on the Thoracic spine. The wound measures 1.4cm length x 0.9cm width x 0.7cm depth; 0.99cm^2 area and 0.693cm^3 volume. There is Fat Layer (Subcutaneous Tissue) exposed. There is no tunneling noted, however, there is undermining starting at 12:00 and ending at 3:00 with a maximum distance of 4.4cm. There is a medium amount of serosanguineous drainage noted. The wound margin is well defined and not attached to the wound base. There is large (67-100%) red, pink granulation within the wound bed. There  is a small (1-33%) amount of necrotic tissue within the wound bed including Adherent Slough. Assessment Active Problems ICD-10 Pressure ulcer of unspecified part of back, stage 4 Type 2 diabetes mellitus with other skin ulcer Unspecified fracture of T11-T12 vertebra, sequela Procedures Wound #1 Pre-procedure diagnosis of Wound #1 is a Pressure Ulcer located on the Thoracic spine . An Chemical Cauterization procedure was performed by Fredirick Maudlin, MD. Post procedure Diagnosis Wound #1: Same as Pre-Procedure Notes: using silver nitrate stick Plan Follow-up Appointments: Return Appointment in 2  weeks. - Dr Celine Ahr - Room 1 - with Vaughan Basta Anesthetic: Wound #1 Thoracic spine: (In clinic) Topical Lidocaine 4% applied to wound bed Bathing/ Shower/ Hygiene: May shower with protection but do not get wound dressing(s) wet. Negative Presssure Wound Therapy: Discontinue wound vac Off-Loading: Turn and reposition every 2 hours Other: - KEEP PRESSURE OFF OF BACK MAY USE EGG CRATE CUSHION Additional Orders / Instructions: Follow Nutritious Diet WOUND #1: - Thoracic spine Wound Laterality: Cleanser: Wound Cleanser (Generic) 1 x Per Day/30 Days Discharge Instructions: Cleanse the wound with wound cleanser prior to applying a clean dressing using gauze sponges, not tissue or cotton balls. Peri-Wound Care: Skin Prep 1 x Per Day/30 Days Discharge Instructions: Use skin prep as directed Prim Dressing: Iodoform packing strip 1/2 (in) 1 x Per Day/30 Days ary Discharge Instructions: Lightly pack into wound especially into underminig from 12-3 o'clock Secondary Dressing: Zetuvit Plus Silicone Border Dressing 4x4 (in/in) (Dispense As Written) 1 x Per Day/30 Days Discharge Instructions: Apply silicone border over primary dressing as directed. 07/02/2022: There has really been no change to the wound over the past week. There is a little hypertrophic granulation tissue at the orifice but otherwise the tunneling is about the same and the wound is clean. I used silver nitrate to chemically cauterize the hypertrophic granulation tissue. We will continue to pack the wound with iodoform packing strips. As we are no longer using the wound VAC, we will extend her visits to every 2 weeks. Electronic Signature(s) Signed: 07/02/2022 3:56:05 PM By: Fredirick Maudlin MD FACS Entered By: Fredirick Maudlin on 07/02/2022 15:56:05 -------------------------------------------------------------------------------- HxROS Details Patient Name: Date of Service: Stacy Like B. 07/02/2022 2:45 PM Medical Record Number:  [id number] Patient Account Number: [id number] Date of Birth/Sex: Treating RN: 08-Jan-1923 (88 y.o. F) Primary Care Provider: [name] Other Clinician: Referring Provider: Treating Provider/Extender: Nicholaus Corolla in Treatment: 27 Ear/Nose/Mouth/Throat Medical History: Negative for: Chronic sinus problems/congestion; Middle ear problems Past Medical History Notes: PARTIAL HEARING LEFT HEAR W/ HEARING AID TOTAL DEAFNESS RIGHT EAR Cardiovascular Medical History: Positive for: Hypertension Gastrointestinal Medical History: Past Medical History Notes: Melena Endocrine Medical History: Positive for: Type II Diabetes Past Medical History Notes: Hypothyroidism Hyperlipidemia associated with type 2 diabetes mellitus ([place]) Treated with: Diet Immunological Medical History: Negative for: Lupus Erythematosus; Raynauds; Scleroderma Musculoskeletal Medical History: Past Medical History Notes: Closed T12 fracture ([place]) Hip fracture ([place]) Closed comminuted intertrochanteric fracture of proximal end of right femur [facility]) Oncologic Medical History: Negative for: Received Chemotherapy; Received Radiation Immunizations Pneumococcal Vaccine: Received Pneumococcal Vaccination: Yes Received Pneumococcal Vaccination On or After 60th Birthday: No Implantable Devices None Hospitalization / Surgery History Type of Hospitalization/Surgery t/11 t/12 FRACTURE/SURGERY FEMUR FX SURGERY TOTAL HYSTERECTOMY CHOLECYSTECTOMY TONSILLECTOMY Family and Social History Unknown History: Yes; Never smoker; Marital Status - Married; Alcohol Use: Never; Drug Use: No History; Caffeine Use: Rarely; Financial Concerns: No; Food, Clothing or Shelter Needs: No; Support System Lacking: No; Transportation Concerns: No Engineer, maintenance) Signed:  07/02/2022 4:13:56 PM By: Fredirick Maudlin MD FACS Entered By: Fredirick Maudlin on 07/02/2022  15:54:54 -------------------------------------------------------------------------------- SuperBill Details Patient Name: Date of Service: [name] 07/02/2022 Medical Record Number: [id number] Patient Account Number: [id number] Date of Birth/Sex: Treating RN: [birth year] (88 y.o. F) Primary Care Provider: [name] Other Clinician: Referring Provider: Treating Provider/Extender: Nicholaus Corolla in Treatment: 27 Diagnosis Coding ICD-10 Codes Code Description L89.104 Pressure ulcer of unspecified part of back, stage 4 E11.622 Type 2 diabetes mellitus with other skin ulcer S22.089S Unspecified fracture of T11-T12 vertebra, sequela Facility Procedures CPT4 Code: [id number] Description: 93903 - CHEM CAUT GRANULATION TISS ICD-10 Diagnosis Description L89.104 Pressure ulcer of unspecified part of back, stage 4 Modifier: Quantity: 1 Physician Procedures : CPT4 Code Description Modifier [id number] - WC PHYS LEVEL 4 - EST PT ICD-10 Diagnosis Description L89.104 Pressure ulcer of unspecified part of back, stage 4 E11.622 Type 2 diabetes mellitus with other skin ulcer S22.089S Unspecified fracture of  T11-T12 vertebra, sequela Quantity: 1 : [id number] - WC PHYS CHEM CAUT GRAN TISSUE ICD-10 Diagnosis Description L89.104 Pressure ulcer of unspecified part of back, stage 4 Quantity: 1 Electronic Signature(s) Signed: 07/02/2022 3:56:28 PM By: Fredirick Maudlin MD FACS Entered By: Fredirick Maudlin on 07/02/2022 15:56:28

## 2022-07-02 NOTE — Progress Notes (Signed)
KRYSTEN, VERONICA ([id number]) Visit Report for 07/02/2022 Arrival Information Details Patient Name: Date of Service: [name], [name] 07/02/2022 2:45 PM Medical Record Number: [id number] Patient Account Number: [id number] Date of Birth/Sex: Treating RN: 06-20-1923 (88 y.o. Elam Dutch Primary Care Calinda Stockinger: [name] Other Clinician: Referring Jeryn Cerney: Treating Tonna Palazzi/Extender: Nicholaus Corolla in Treatment: 27 Visit Information History Since Last Visit Added or deleted any medications: No Patient Arrived: Wheel Chair Any new allergies or adverse reactions: No Arrival Time: 15:34 Had a fall or experienced change in No Accompanied By: daughter activities of daily living that may affect Transfer Assistance: None risk of falls: Patient Identification Verified: Yes Signs or symptoms of abuse/neglect since last visito No Secondary Verification Process Completed: Yes Hospitalized since last visit: No Patient Requires Transmission-Based Precautions: No Implantable device outside of the clinic excluding No Patient Has Alerts: No cellular tissue based products placed in the center since last visit: Has Dressing in Place as Prescribed: Yes Pain Present Now: Yes Electronic Signature(s) Signed: 07/02/2022 5:20:46 PM By: Baruch Gouty RN, BSN Entered By: Baruch Gouty on 07/02/2022 15:35:04 -------------------------------------------------------------------------------- Lower Extremity Assessment Details Patient Name: Date of Service: [name], [name] 07/02/2022 2:45 PM Medical Record Number: [id number] Patient Account Number: [id number] Date of Birth/Sex: Treating RN: 07-03-1923 (88 y.o. Elam Dutch Primary Care Tilman Mcclaren: [name] Other Clinician: Referring Milayna Rotenberg: Treating Jahrel Borthwick/Extender: Nicholaus Corolla in Treatment: 27 Electronic Signature(s) Signed: 07/02/2022 5:20:46 PM By: Baruch Gouty RN,  BSN Entered By: Baruch Gouty on 07/02/2022 15:38:32 -------------------------------------------------------------------------------- Multi Wound Chart Details Patient Name: Date of Service: [name] 07/02/2022 2:45 PM Medical Record Number: [id number] Patient Account Number: [id number] Date of Birth/Sex: Treating RN: [birth year] (88 y.o. F) Primary Care Edwyna Dangerfield: [name] Other Clinician: Referring Thirza Pellicano: Treating Derrian Rodak/Extender: Nicholaus Corolla in Treatment: 27 Vital Signs Height(in): 62 Pulse(bpm): 92 Weight(lbs): 110 Blood Pressure(mmHg): 134/76 Body Mass Index(BMI): 20.1 Temperature(F): 98.5 Respiratory Rate(breaths/min): 18 Photos: [N/A:N/A] Thoracic spine N/A N/A Wound Location: Gradually Appeared N/A N/A Wounding Event: Pressure Ulcer N/A N/A Primary Etiology: Hypertension, Type II Diabetes N/A N/A Comorbid History: 12/19/2020 N/A N/A Date Acquired: 71 N/A N/A Weeks of Treatment: Open N/A N/A Wound Status: No N/A N/A Wound Recurrence: 1.4x0.9x0.7 N/A N/A Measurements L x W x D (cm) 0.99 N/A N/A A (cm) : rea 0.693 N/A N/A Volume (cm) : 71.40% N/A N/A % Reduction in A rea: 86.60% N/A N/A % Reduction in Volume: 12 Starting Position 1 (o'clock): 3 Ending Position 1 (o'clock): 4.4 Maximum Distance 1 (cm): Yes N/A N/A Undermining: Category/Stage IV N/A N/A Classification: Medium N/A N/A Exudate A mount: Serosanguineous N/A N/A Exudate Type: red, brown N/A N/A Exudate Color: Well defined, not attached N/A N/A Wound Margin: Large (67-100%) N/A N/A Granulation A mount: Red, Pink N/A N/A Granulation Quality: Small (1-33%) N/A N/A Necrotic A mount: Fat Layer (Subcutaneous Tissue): Yes N/A N/A Exposed Structures: Fascia: No Tendon: No Muscle: No Joint: No Bone: No Small (1-33%) N/A N/A Epithelialization: Chemical Cauterization N/A N/A Procedures Performed: Treatment Notes Electronic  Signature(s) Signed: 07/02/2022 3:54:12 PM By: Fredirick Maudlin MD FACS Entered By: Fredirick Maudlin on 07/02/2022 15:54:12 -------------------------------------------------------------------------------- Multi-Disciplinary Care Plan Details Patient Name: Date of Service: Stacy Like B. 07/02/2022 2:45 PM Medical Record Number: [id number] Patient Account Number: [id number] Date of Birth/Sex: Treating RN: Jul 02, 1923 (88 y.o. Elam Dutch Primary Care Kendell Sagraves: [name] Other Clinician: Referring Kristion Holifield: Treating Timarion Agcaoili/Extender: Nicholaus Corolla in Treatment: 27  Multidisciplinary Care Plan reviewed with physician Active Inactive Pressure Nursing Diagnoses: Knowledge deficit related to causes and risk factors for pressure ulcer development Knowledge deficit related to management of pressures ulcers Potential for impaired tissue integrity related to pressure, friction, moisture, and shear Goals: Patient will remain free of pressure ulcers Date Initiated: 12/25/2021 Date Inactivated: 04/11/2022 Target Resolution Date: 04/06/2022 Goal Status: Met Patient/caregiver will verbalize understanding of pressure ulcer management Date Initiated: 12/25/2021 Target Resolution Date: 08/03/2022 Goal Status: Active Interventions: Assess: immobility, friction, shearing, incontinence upon admission and as needed Assess offloading mechanisms upon admission and as needed Notes: Wound/Skin Impairment Nursing Diagnoses: Impaired tissue integrity Knowledge deficit related to ulceration/compromised skin integrity Goals: Patient will have a decrease in wound volume by X% from date: (specify in notes) Date Initiated: 12/20/2021 Date Inactivated: 01/08/2022 Target Resolution Date: 01/12/2022 Goal Status: Unmet Unmet Reason: pressure relief Patient/caregiver will verbalize understanding of skin care regimen Date Initiated: 12/20/2021 Target Resolution Date:  08/03/2022 Goal Status: Active Ulcer/skin breakdown will have a volume reduction of 30% by week 4 Date Initiated: 12/20/2021 Date Inactivated: 04/11/2022 Target Resolution Date: 04/06/2022 Goal Status: Unmet Unmet Reason: infection, osteo Ulcer/skin breakdown will have a volume reduction of 50% by week 8 Date Initiated: 12/20/2021 Date Inactivated: 04/11/2022 Target Resolution Date: 04/06/2022 Unmet Reason: osteo, severe Goal Status: Unmet kyphosis Interventions: Assess patient/caregiver ability to obtain necessary supplies Assess patient/caregiver ability to perform ulcer/skin care regimen upon admission and as needed Assess ulceration(s) every visit Notes: Electronic Signature(s) Signed: 07/02/2022 5:20:46 PM By: Baruch Gouty RN, BSN Entered By: Baruch Gouty on 07/02/2022 15:47:08 -------------------------------------------------------------------------------- Pain Assessment Details Patient Name: Date of Service: [name] 07/02/2022 2:45 PM Medical Record Number: [id number] Patient Account Number: [id number] Date of Birth/Sex: Treating RN: 09/07/1923 (88 y.o. Elam Dutch Primary Care Taralyn Ferraiolo: [name] Other Clinician: Referring Ema Hebner: Treating Zane Samson/Extender: Nicholaus Corolla in Treatment: 27 Active Problems Location of Pain Severity and Description of Pain Patient Has Paino Yes Site Locations Pain Location: Pain in Ulcers With Dressing Change: Yes Duration of the Pain. Constant / Intermittento Intermittent Rate the pain. Current Pain Level: 2 Worst Pain Level: 5 Least Pain Level: 0 Character of Pain Describe the Pain: Aching, Dull Pain Management and Medication Current Pain Management: Medication: Yes Other: reposition Is the Current Pain Management Adequate: Adequate How does your wound impact your activities of daily livingo Sleep: No Bathing: No Appetite: No Relationship With Others: No Bladder  Continence: No Emotions: No Bowel Continence: No Work: No Toileting: No Drive: No Dressing: No Hobbies: No Engineer, maintenance) Signed: 07/02/2022 5:20:46 PM By: Baruch Gouty RN, BSN Entered By: Baruch Gouty on 07/02/2022 15:38:24 -------------------------------------------------------------------------------- Patient/Caregiver Education Details Patient Name: Date of Service: [name] 9/25/2023andnbsp2:45 PM Medical Record Number: [id number] Patient Account Number: [id number] Date of Birth/Gender: Treating RN: 12/15/1922 (88 y.o. Elam Dutch Primary Care Physician: [name] Other Clinician: Referring Physician: Treating Physician/Extender: Nicholaus Corolla in Treatment: 27 Education Assessment Education Provided To: Patient Education Topics Provided Pressure: Methods: Explain/Verbal Responses: Reinforcements needed, State content correctly Wound/Skin Impairment: Methods: Explain/Verbal Responses: Reinforcements needed, State content correctly Electronic Signature(s) Signed: 07/02/2022 5:20:46 PM By: Baruch Gouty RN, BSN Entered By: Baruch Gouty on 07/02/2022 15:47:34 -------------------------------------------------------------------------------- Wound Assessment Details Patient Name: Date of Service: [name] 07/02/2022 2:45 PM Medical Record Number: [id number] Patient Account Number: [id number] Date of Birth/Sex: Treating RN: [birth year] (88 y.o. Elam Dutch Primary Care Lineth Thielke: [name] Other Clinician: Referring Kijana Estock: Treating  Ryleigh Esqueda/Extender: Nicholaus Corolla in Treatment: 27 Wound Status Wound Number: 1 Primary Etiology: Pressure Ulcer Wound Location: Thoracic spine Wound Status: Open Wounding Event: Gradually Appeared Comorbid History: Hypertension, Type II Diabetes Date Acquired: 12/19/2020 Weeks Of Treatment: 27 Clustered Wound:  No Photos Wound Measurements Length: (cm) 1.4 Width: (cm) 0.9 Depth: (cm) 0.7 Area: (cm) 0.99 Volume: (cm) 0.693 % Reduction in Area: 71.4% % Reduction in Volume: 86.6% Epithelialization: Small (1-33%) Tunneling: No Undermining: Yes Starting Position (o'clock): 12 Ending Position (o'clock): 3 Maximum Distance: (cm) 4.4 Wound Description Classification: Category/Stage IV Wound Margin: Well defined, not attached Exudate Amount: Medium Exudate Type: Serosanguineous Exudate Color: red, brown Foul Odor After Cleansing: No Slough/Fibrino Yes Wound Bed Granulation Amount: Large (67-100%) Exposed Structure Granulation Quality: Red, Pink Fascia Exposed: No Necrotic Amount: Small (1-33%) Fat Layer (Subcutaneous Tissue) Exposed: Yes Necrotic Quality: Adherent Slough Tendon Exposed: No Muscle Exposed: No Joint Exposed: No Bone Exposed: No Electronic Signature(s) Signed: 07/02/2022 5:20:46 PM By: Baruch Gouty RN, BSN Entered By: Baruch Gouty on 07/02/2022 15:44:51 -------------------------------------------------------------------------------- [place] Details Patient Name: Date of Service: Stacy Like B. 07/02/2022 2:45 PM Medical Record Number: [id number] Patient Account Number: [id number] Date of Birth/Sex: Treating RN: 05-Jan-1923 (88 y.o. Elam Dutch Primary Care Takila Kronberg: [name] Other Clinician: Referring Azani Brogdon: Treating Seher Schlagel/Extender: Nicholaus Corolla in Treatment: 27 Vital Signs Time Taken: 15:35 Temperature (F): 98.5 Height (in): 62 Pulse (bpm): 92 Weight (lbs): 110 Respiratory Rate (breaths/min): 18 Body Mass Index (BMI): 20.1 Blood Pressure (mmHg): 134/76 Reference Range: 80 - 120 mg / dl Electronic Signature(s) Signed: 07/02/2022 5:20:46 PM By: Baruch Gouty RN, BSN Entered By: Baruch Gouty on 07/02/2022 15:35:32

## 2022-07-16 ENCOUNTER — Encounter (HOSPITAL_BASED_OUTPATIENT_CLINIC_OR_DEPARTMENT_OTHER): Payer: Medicare Other | Attending: General Surgery | Admitting: General Surgery

## 2022-07-16 DIAGNOSIS — X58XXXS Exposure to other specified factors, sequela: Secondary | ICD-10-CM | POA: Diagnosis not present

## 2022-07-16 DIAGNOSIS — L89104 Pressure ulcer of unspecified part of back, stage 4: Secondary | ICD-10-CM | POA: Insufficient documentation

## 2022-07-16 DIAGNOSIS — E11622 Type 2 diabetes mellitus with other skin ulcer: Secondary | ICD-10-CM | POA: Insufficient documentation

## 2022-07-16 DIAGNOSIS — S22089S Unspecified fracture of T11-T12 vertebra, sequela: Secondary | ICD-10-CM | POA: Insufficient documentation

## 2022-07-16 NOTE — Progress Notes (Signed)
DELIANA, AVALOS ([id number]) [id number] Page 1 of 9 Visit Report for 07/16/2022 Chief Complaint Document Details Patient Name: Date of Service: LUZMARIA, [name] 07/16/2022 2:45 PM Medical Record Number: [id number] Patient Account Number: [id number] Date of Birth/Sex: Treating RN: Nov 01, 1922 (88 y.o. F) Primary Care Provider: [name] Other Clinician: Referring Provider: Treating Provider/Extender: Nicholaus Corolla in Treatment: 29 Information Obtained from: Patient Chief Complaint Patient is at the clinic for treatment of an open pressure ulcer Electronic Signature(s) Signed: 07/16/2022 3:35:04 PM By: Fredirick Maudlin MD FACS Entered By: Fredirick Maudlin on 07/16/2022 15:35:03 -------------------------------------------------------------------------------- HPI Details Patient Name: Date of Service: Stacy Like B. 07/16/2022 2:45 PM Medical Record Number: [id number] Patient Account Number: [id number] Date of Birth/Sex: Treating RN: 1922/10/17 (88 y.o. F) Primary Care Provider: [name] Other Clinician: Referring Provider: Treating Provider/Extender: Nicholaus Corolla in Treatment: 29 History of Present Illness HPI Description: ADMISSION 12/19/21 This is a 88 year old woman who is presenting to clinic today with a an open ulcer over her thoracic spine. She has severe kyphosis and a history of prior T12 fracture, as well as type 2 diabetes mellitus. The wound has been present for about a year. She is accompanied by her daughters, 1 of whom is a Marine scientist. They have been trying to offload the site by using pillows and supports to keep returned while in bed, and an eggcrate foam with a cut out for the wound for when she is sitting up. They have been applying Santyl to the site. She does have home health and the provider took a culture which was positive for Pseudomonas. She was on ciprofloxacin for this. A  recent repeat culture was negative. Most recent hemoglobin A1c was 6.5, in February. A C-reactive protein also obtained in February was elevated at 24.1. No imaging has been performed of the site. The patient denies significant pain. 12/25/2021: Last week, I took a bone biopsy as well as culture. Fortunately, the bone biopsy was negative for osteomyelitis. The culture returned with rare corynebacterium and rare Enterococcus faecalis. We have been using Santyl and Hydrofera Blue. T oday, the wound appears cleaner and there are buds of granulation tissue forming around the perimeter. 01/01/2022: Due to the positive culture, we switched to topical gentamicin. Today, she has had a little bit of a slough accumulation. The wound continues to have buds of granulation tissue forming. Apparently, the Hydrofera Blue was placed just over the wound opening rather than down in to contact the surface. No purulent drainage or odor. 01/08/2022: Last week, there was more slough in the wound bed, so I changed back to Santyl. There is now hypertrophic granulation tissue around the wound opening. Once again, we seem to be having some difficulty getting the Hydrofera Blue tucked up into the wound; there is undermining that extends for 3 to 4 cm at the 12 o'clock position and the Hydrofera Blue is simply sitting on top of the wound without being in contact with the surface. Bone is still palpable, but tissue is beginning to close in over it. 01/22/2022: The wound looks much cleaner this week and the hypertrophic granulation tissue responded appropriately to silver nitrate application. She continues to have undermining for about 4 cm from 12:00 to 4:00. I can still feel the bone in the base at about the 2 o'clock position, but it is less prominent and tissue is closing over. We have been using Santyl and Hydrofera Blue. The daughter that accompanies the patient today indicates  that they have been very diligent in making sure the  [facility] is tucked under and into the full base of the wound. 02/05/2022: The wound overall is improved. The undermining is about the same but there is less bone palpable. We have been using Santyl and Hydrofera Blue. Minimal slough accumulation in the 2 weeks since her last visit. ALESSANDRIA, HENKEN ([id number]) [id number] Page 2 of 9 02/19/2022: The visible wound surface is very clean without any slough accumulation. Although I can feel bone in the undermined portion of the wound, it is now covered with a layer of tissue. The undermined area is about the same size, however. We have been using Santyl with Hydrofera Blue. She has been approved for snap VAC use. 02/26/2022: Last week, we applied a snap VAC. The wound dimensions have come in by a bit in all directions. The VAC canister did fill, however, and was starting to leak so the patient's daughter removed the VAC and dressed the wound as we had been previously. There is a little bit of thin slough on the wound surface with some senescent tissue at the inferior margin. Bone is still palpable but covered with a layer of tissue. No odor. 03/06/2022: We continue to have technical difficulties with the snap VAC which resulted in the patient's family removing it and going back to the dressing changes with Hydrofera Blue. It sounds like there are issues maintaining suction and the drape is getting bunched up creating a leak. The wound measured larger today, but the intake nurse thinks that perhaps her measurements were taken slightly differently than last week's. There is just some thin slough on the wound surface as well as some hypertrophic granulation tissue on the lateral border. 03/13/2022: The snap VAC worked much better this week and only began leaking yesterday. At that point, the patient's family removed it and applied Hydrofera Blue to her wound. Today, the tissue coverage over the bone that had been exposed is more  substantial. She still has undermining present. There is also some senescent skin around the wound orifice along with some hypertrophic granulation tissue. 03/20/2022: The snap VAC unfortunately failed on Friday. There has really been no significant change to the wound for several weeks. The degree of undermining is unchanged. The surface is clean. 03/27/2022: Once again, the snap VAC failed prematurely and had to be removed on Saturday. The undermining remains the same. There is more tissue coverage of the previously exposed bone. The wound surface has just a light layer of biofilm and thin slough. She was approved for a standard wound VAC, but there are currently no home health agencies able to staff dressing changes for her; it is too difficult for her and her family to bring her to the wound center more than once a week. 04/03/2022: The snap VAC stayed on but the canister filled up on Sunday. The direct depth of the wound is a little bit shallower but the undermining has not really changed. There is some slightly discolored, darker tissue at the 9 o'clock position. No odor or purulent drainage. 04/11/2022: Using 2 canisters worked well and the snap VAC lasted until yesterday. She has her standard wound VAC with her today. The orifice of the wound has contracted somewhat. There is still substantial undermining at 12:00, but the undermined portion to the right between 2 and 4:00 has closed and somewhat. The previously exposed bone is no longer even palpable under the overlying tissue. No concern for infection. 04/17/2022: A standard  VAC dressing was initiated last week. The family has been changing it at home, due to the inability to secure home health services. It is a little bit challenging to apply, due to the small orifice of the wound with substantial undermining. Today, the undermining at 12:00 seems like it might be a little bit deeper, but the 2-4 o'clock undermining is decreased. There is  excellent tissue coverage over the previously exposed bone. There is a little bit of slough accumulation at the orifice, along with some senescent skin. 04/23/2022: The wound VAC was not changed all week due to the family being overwhelmed with other issues. Fortunately, there does not seem to have been any untoward effects from this. The orifice of the wound remains unchanged but the undermining and tunneling has narrowed to just 1 area at 12:00. No purulent drainage or odor. Good tissue coverage of the previously exposed bone. 04/30/2022: The tunneling has narrowed substantially. There is no purulent drainage or odor from the wound. The orifice of the wound is about the same size and there is some hypertrophic granulation tissue at the opening. 05/07/2022: The undermining has come in substantially. The only particularly deep area is from 1-2 o'clock. The rest of the wound is clean without any purulent drainage or odor. 05/14/2022: The orifice of the wound has contracted further. She continues to have just under 5 cm of tunneling at 12:00, but the wound is clean without any purulent drainage or odor. 05/21/2022: The tunnel has come in by about half a centimeter. The wound is clean without any slough. 05/28/2022: The tunnel continues to contract. The wound is clean with good granulation tissue at the base. No concern for infection. 06/04/2022: The tunnel is about the same length this week, but it is quite a bit narrower. The wound is clean with good granulation tissue. Unfortunately, her family applied the track pad directly over the sponge without any intervening drape and without bridging it away from her spine so she has a small ulcer from the track pad just caudal to the wound. 06/12/2022: The small ulcer from the track pad that was present last week has healed. Otherwise the wound is basically unchanged. 06/18/2022: No real change to the tunneling. The surface of the wound is clean. 06/25/2022: We  discontinued the wound VAC last week and has been packing with iodoform packing strips. The tunneling has come in by about half a centimeter. The wound is clean. 07/02/2022: There has really been no change to the wound over the past week. There is a little hypertrophic granulation tissue at the orifice but otherwise the tunneling is about the same and the wound is clean. 07/16/2022: The tunneling has come in by a couple of millimeters. The wound is clean otherwise. Electronic Signature(s) Signed: 07/16/2022 3:35:39 PM By: Fredirick Maudlin MD FACS Entered By: Fredirick Maudlin on 07/16/2022 15:35:38 -------------------------------------------------------------------------------- Physical Exam Details Patient Name: Date of Service: Marijean Heath 07/16/2022 2:45 PM Medical Record Number: [id number] Patient Account Number: [id number] Date of Birth/Sex: Treating RN: [birth year] (88 y.o. Keiarah Orlowski, [place] B ([id number]) [id number] Page 3 of 9 Primary Care Provider: [name] Other Clinician: Referring Provider: Treating Provider/Extender: Nicholaus Corolla in Treatment: 29 Constitutional Slightly hypertensive. . . . No acute distress.Marland Kitchen Respiratory Normal work of breathing on room air.. Notes 07/16/2022: The tunneling has come in by a couple of millimeters. The wound is clean otherwise Electronic Signature(s) Signed: 07/16/2022 3:36:06 PM By: Fredirick Maudlin MD FACS Entered By: Fredirick Maudlin  on 07/16/2022 15:36:06 -------------------------------------------------------------------------------- Physician Orders Details Patient Name: Date of Service: REKHA, HOBBINS 07/16/2022 2:45 PM Medical Record Number: [id number] Patient Account Number: [id number] Date of Birth/Sex: Treating RN: 1922/10/31 (88 y.o. Elam Dutch Primary Care Provider: [name] Other Clinician: Referring Provider: Treating Provider/Extender: Nicholaus Corolla in Treatment: 29 Verbal / Phone Orders: No Diagnosis Coding ICD-10 Coding Code Description L89.104 Pressure ulcer of unspecified part of back, stage 4 E11.622 Type 2 diabetes mellitus with other skin ulcer S22.089S Unspecified fracture of T11-T12 vertebra, sequela Follow-up Appointments ppointment in 2 weeks. - Dr Celine Ahr - Room 1 - with Vaughan Basta Return A Anesthetic Wound #1 Thoracic spine (In clinic) Topical Lidocaine 4% applied to wound bed Bathing/ Shower/ Hygiene May shower with protection but do not get wound dressing(s) wet. Off-Loading Turn and reposition every 2 hours Other: - KEEP PRESSURE OFF OF BACK MAY USE EGG CRATE CUSHION Additional Orders / Instructions Follow Nutritious Diet Wound Treatment Wound #1 - Thoracic spine Cleanser: Wound Cleanser (Generic) 1 x Per Day/30 Days Discharge Instructions: Cleanse the wound with wound cleanser prior to applying a clean dressing using gauze sponges, not tissue or cotton balls. Peri-Wound Care: Skin Prep 1 x Per Day/30 Days Discharge Instructions: Use skin prep as directed Prim Dressing: Iodoform packing strip 1/2 (in) 1 x Per Day/30 Days ary Discharge Instructions: Lightly pack into wound especially into underminig from 12-3 o'clock Secondary Dressing: Zetuvit Plus Silicone Border Dressing 4x4 (in/in) (DME) (Generic) 1 x Per Day/30 Days FRONNIE, URTON ([id number]) [id number] Page 4 of 9 Discharge Instructions: Apply silicone border over primary dressing as directed. Electronic Signature(s) Signed: 07/16/2022 4:19:05 PM By: Fredirick Maudlin MD FACS Signed: 07/16/2022 4:29:03 PM By: Baruch Gouty RN, BSN Entered By: Baruch Gouty on 07/16/2022 15:36:33 -------------------------------------------------------------------------------- Problem List Details Patient Name: Date of Service: Marijean Heath 07/16/2022 2:45 PM Medical Record Number: [id number] Patient  Account Number: [id number] Date of Birth/Sex: Treating RN: 08-24-1923 (88 y.o. Elam Dutch Primary Care Provider: [name] Other Clinician: Referring Provider: Treating Provider/Extender: Nicholaus Corolla in Treatment: 29 Active Problems ICD-10 Encounter Code Description Active Date MDM Diagnosis L89.104 Pressure ulcer of unspecified part of back, stage 4 12/19/2021 No Yes E11.622 Type 2 diabetes mellitus with other skin ulcer 12/19/2021 No Yes S22.089S Unspecified fracture of T11-T12 vertebra, sequela 12/19/2021 No Yes Inactive Problems Resolved Problems Electronic Signature(s) Signed: 07/16/2022 3:34:52 PM By: Fredirick Maudlin MD FACS Entered By: Fredirick Maudlin on 07/16/2022 15:34:52 -------------------------------------------------------------------------------- Progress Note Details Patient Name: Date of Service: Stacy Like B. 07/16/2022 2:45 PM Medical Record Number: [id number] Patient Account Number: [id number] Date of Birth/Sex: Treating RN: 1923/01/20 (88 y.o. F) Primary Care Provider: [name] Other Clinician: Referring Provider: Treating Provider/Extender: Nicholaus Corolla in Treatment: 29 Subjective Chief Complaint Information obtained from Patient [name], KORPI ([id number]) [id number] Page 5 of 9 Patient is at the clinic for treatment of an open pressure ulcer History of Present Illness (HPI) ADMISSION 12/19/21 This is a 88 year old woman who is presenting to clinic today with a an open ulcer over her thoracic spine. She has severe kyphosis and a history of prior T12 fracture, as well as type 2 diabetes mellitus. The wound has been present for about a year. She is accompanied by her daughters, 1 of whom is a Marine scientist. They have been trying to offload the site by using pillows and supports to keep returned while in bed, and an eggcrate foam with a  cut out for the wound for  when she is sitting up. They have been applying Santyl to the site. She does have home health and the provider took a culture which was positive for Pseudomonas. She was on ciprofloxacin for this. A recent repeat culture was negative. Most recent hemoglobin A1c was 6.5, in February. A C-reactive protein also obtained in February was elevated at 24.1. No imaging has been performed of the site. The patient denies significant pain. 12/25/2021: Last week, I took a bone biopsy as well as culture. Fortunately, the bone biopsy was negative for osteomyelitis. The culture returned with rare corynebacterium and rare Enterococcus faecalis. We have been using Santyl and Hydrofera Blue. T oday, the wound appears cleaner and there are buds of granulation tissue forming around the perimeter. 01/01/2022: Due to the positive culture, we switched to topical gentamicin. Today, she has had a little bit of a slough accumulation. The wound continues to have buds of granulation tissue forming. Apparently, the Hydrofera Blue was placed just over the wound opening rather than down in to contact the surface. No purulent drainage or odor. 01/08/2022: Last week, there was more slough in the wound bed, so I changed back to Santyl. There is now hypertrophic granulation tissue around the wound opening. Once again, we seem to be having some difficulty getting the Hydrofera Blue tucked up into the wound; there is undermining that extends for 3 to 4 cm at the 12 o'clock position and the Hydrofera Blue is simply sitting on top of the wound without being in contact with the surface. Bone is still palpable, but tissue is beginning to close in over it. 01/22/2022: The wound looks much cleaner this week and the hypertrophic granulation tissue responded appropriately to silver nitrate application. She continues to have undermining for about 4 cm from 12:00 to 4:00. I can still feel the bone in the base at about the 2 o'clock position, but it is  less prominent and tissue is closing over. We have been using Santyl and Hydrofera Blue. The daughter that accompanies the patient today indicates that they have been very diligent in making sure the [facility] is tucked under and into the full base of the wound. 02/05/2022: The wound overall is improved. The undermining is about the same but there is less bone palpable. We have been using Santyl and Hydrofera Blue. Minimal slough accumulation in the 2 weeks since her last visit. 02/19/2022: The visible wound surface is very clean without any slough accumulation. Although I can feel bone in the undermined portion of the wound, it is now covered with a layer of tissue. The undermined area is about the same size, however. We have been using Santyl with Hydrofera Blue. She has been approved for snap VAC use. 02/26/2022: Last week, we applied a snap VAC. The wound dimensions have come in by a bit in all directions. The VAC canister did fill, however, and was starting to leak so the patient's daughter removed the VAC and dressed the wound as we had been previously. There is a little bit of thin slough on the wound surface with some senescent tissue at the inferior margin. Bone is still palpable but covered with a layer of tissue. No odor. 03/06/2022: We continue to have technical difficulties with the snap VAC which resulted in the patient's family removing it and going back to the dressing changes with Hydrofera Blue. It sounds like there are issues maintaining suction and the drape is getting bunched up creating  a leak. The wound measured larger today, but the intake nurse thinks that perhaps her measurements were taken slightly differently than last week's. There is just some thin slough on the wound surface as well as some hypertrophic granulation tissue on the lateral border. 03/13/2022: The snap VAC worked much better this week and only began leaking yesterday. At that point, the patient's family  removed it and applied Hydrofera Blue to her wound. Today, the tissue coverage over the bone that had been exposed is more substantial. She still has undermining present. There is also some senescent skin around the wound orifice along with some hypertrophic granulation tissue. 03/20/2022: The snap VAC unfortunately failed on Friday. There has really been no significant change to the wound for several weeks. The degree of undermining is unchanged. The surface is clean. 03/27/2022: Once again, the snap VAC failed prematurely and had to be removed on Saturday. The undermining remains the same. There is more tissue coverage of the previously exposed bone. The wound surface has just a light layer of biofilm and thin slough. She was approved for a standard wound VAC, but there are currently no home health agencies able to staff dressing changes for her; it is too difficult for her and her family to bring her to the wound center more than once a week. 04/03/2022: The snap VAC stayed on but the canister filled up on Sunday. The direct depth of the wound is a little bit shallower but the undermining has not really changed. There is some slightly discolored, darker tissue at the 9 o'clock position. No odor or purulent drainage. 04/11/2022: Using 2 canisters worked well and the snap VAC lasted until yesterday. She has her standard wound VAC with her today. The orifice of the wound has contracted somewhat. There is still substantial undermining at 12:00, but the undermined portion to the right between 2 and 4:00 has closed and somewhat. The previously exposed bone is no longer even palpable under the overlying tissue. No concern for infection. 04/17/2022: A standard VAC dressing was initiated last week. The family has been changing it at home, due to the inability to secure home health services. It is a little bit challenging to apply, due to the small orifice of the wound with substantial undermining. Today, the  undermining at 12:00 seems like it might be a little bit deeper, but the 2-4 o'clock undermining is decreased. There is excellent tissue coverage over the previously exposed bone. There is a little bit of slough accumulation at the orifice, along with some senescent skin. 04/23/2022: The wound VAC was not changed all week due to the family being overwhelmed with other issues. Fortunately, there does not seem to have been any untoward effects from this. The orifice of the wound remains unchanged but the undermining and tunneling has narrowed to just 1 area at 12:00. No purulent drainage or odor. Good tissue coverage of the previously exposed bone. 04/30/2022: The tunneling has narrowed substantially. There is no purulent drainage or odor from the wound. The orifice of the wound is about the same size and there is some hypertrophic granulation tissue at the opening. 05/07/2022: The undermining has come in substantially. The only particularly deep area is from 1-2 o'clock. The rest of the wound is clean without any purulent drainage or odor. 05/14/2022: The orifice of the wound has contracted further. She continues to have just under 5 cm of tunneling at 12:00, but the wound is clean without any purulent drainage or odor. 05/21/2022: The  tunnel has come in by about half a centimeter. The wound is clean without any slough. 05/28/2022: The tunnel continues to contract. The wound is clean with good granulation tissue at the base. No concern for infection. 06/04/2022: The tunnel is about the same length this week, but it is quite a bit narrower. The wound is clean with good granulation tissue. Unfortunately, her family applied the track pad directly over the sponge without any intervening drape and without bridging it away from her spine so she has a small ulcer from the track pad just caudal to the wound. LORAN, AUGUSTE ([id number]) [id number] Page 6 of 9 06/12/2022: The small ulcer  from the track pad that was present last week has healed. Otherwise the wound is basically unchanged. 06/18/2022: No real change to the tunneling. The surface of the wound is clean. 06/25/2022: We discontinued the wound VAC last week and has been packing with iodoform packing strips. The tunneling has come in by about half a centimeter. The wound is clean. 07/02/2022: There has really been no change to the wound over the past week. There is a little hypertrophic granulation tissue at the orifice but otherwise the tunneling is about the same and the wound is clean. 07/16/2022: The tunneling has come in by a couple of millimeters. The wound is clean otherwise. Patient History Family History Unknown History. Social History Never smoker, Marital Status - Married, Alcohol Use - Never, Drug Use - No History, Caffeine Use - Rarely. Medical History Ear/Nose/Mouth/Throat Denies history of Chronic sinus problems/congestion, Middle ear problems Cardiovascular Patient has history of Hypertension Endocrine Patient has history of Type II Diabetes Immunological Denies history of Lupus Erythematosus, Raynaudoos, Scleroderma Oncologic Denies history of Received Chemotherapy, Received Radiation Hospitalization/Surgery History - t/11 t/12 FRACTURE/SURGERY. - FEMUR FX SURGERY. - TOTAL HYSTERECTOMY. - CHOLECYSTECTOMY. - TONSILLECTOMY. Medical A Surgical History Notes nd Ear/Nose/Mouth/Throat PARTIAL HEARING LEFT HEAR W/ HEARING AID TOTAL DEAFNESS RIGHT EAR Gastrointestinal Melena Endocrine Hypothyroidism Hyperlipidemia associated with type 2 diabetes mellitus (HCC) Musculoskeletal Closed T12 fracture (HCC) Hip fracture (HCC) Closed comminuted intertrochanteric fracture of proximal end of right femur (HCC) Objective Constitutional Slightly hypertensive. No acute distress.. Vitals Time Taken: 3:07 PM, Height: 62 in, Weight: 110 lbs, BMI: 20.1, Temperature: 97.7 F, Pulse: 90 bpm, Respiratory Rate: 18  breaths/min, Blood Pressure: 144/74 mmHg. Respiratory Normal work of breathing on room air.. General Notes: 07/16/2022: The tunneling has come in by a couple of millimeters. The wound is clean otherwise Integumentary (Hair, Skin) Wound #1 status is Open. Original cause of wound was Gradually Appeared. The date acquired was: 12/19/2020. The wound has been in treatment 29 weeks. The wound is located on the Thoracic spine. The wound measures 0.9cm length x 0.8cm width x 0.8cm depth; 0.565cm^2 area and 0.452cm^3 volume. There is Fat Layer (Subcutaneous Tissue) exposed. There is no tunneling noted, however, there is undermining starting at 12:00 and ending at 3:00 with a maximum distance of 4.2cm. There is a medium amount of serosanguineous drainage noted. The wound margin is well defined and not attached to the wound base. There is large (67-100%) red, pink granulation within the wound bed. There is a small (1-33%) amount of necrotic tissue within the wound bed including Adherent Slough. The periwound skin appearance had no abnormalities noted for texture. The periwound skin appearance had no abnormalities noted for moisture. The periwound skin appearance had no abnormalities noted for color. Periwound temperature was noted as No Abnormality. Assessment Active Problems ICD-10 Pressure ulcer of unspecified part  of back, stage 4 Type 2 diabetes mellitus with other skin ulcer EPHRATA, VERVILLE B ([id number]) [id number] Page 7 of 9 Unspecified fracture of T11-T12 vertebra, sequela Plan Follow-up Appointments: Return Appointment in 2 weeks. - Dr Celine Ahr - Room 1 - with Vaughan Basta Anesthetic: Wound #1 Thoracic spine: (In clinic) Topical Lidocaine 4% applied to wound bed Bathing/ Shower/ Hygiene: May shower with protection but do not get wound dressing(s) wet. Off-Loading: Turn and reposition every 2 hours Other: - KEEP PRESSURE OFF OF BACK MAY USE EGG CRATE CUSHION Additional  Orders / Instructions: Follow Nutritious Diet WOUND #1: - Thoracic spine Wound Laterality: Cleanser: Wound Cleanser (Generic) 1 x Per Day/30 Days Discharge Instructions: Cleanse the wound with wound cleanser prior to applying a clean dressing using gauze sponges, not tissue or cotton balls. Peri-Wound Care: Skin Prep 1 x Per Day/30 Days Discharge Instructions: Use skin prep as directed Prim Dressing: Iodoform packing strip 1/2 (in) 1 x Per Day/30 Days ary Discharge Instructions: Lightly pack into wound especially into underminig from 12-3 o'clock Secondary Dressing: Zetuvit Plus Silicone Border Dressing 4x4 (in/in) (Dispense As Written) 1 x Per Day/30 Days Discharge Instructions: Apply silicone border over primary dressing as directed. 07/16/2022: The tunneling has come in by a couple of millimeters. The wound is clean otherwise No debridement was necessary today. We will continue to pack the wound with iodoform packing strips. Follow-up in 2 weeks. Electronic Signature(s) Signed: 07/16/2022 3:36:37 PM By: Fredirick Maudlin MD FACS Entered By: Fredirick Maudlin on 07/16/2022 15:36:37 -------------------------------------------------------------------------------- HxROS Details Patient Name: Date of Service: Stacy Like B. 07/16/2022 2:45 PM Medical Record Number: [id number] Patient Account Number: [id number] Date of Birth/Sex: Treating RN: 10-22-1922 (88 y.o. F) Primary Care Provider: [name] Other Clinician: Referring Provider: Treating Provider/Extender: Nicholaus Corolla in Treatment: 29 Ear/Nose/Mouth/Throat Medical History: Negative for: Chronic sinus problems/congestion; Middle ear problems Past Medical History Notes: PARTIAL HEARING LEFT HEAR W/ HEARING [place] TOTAL DEAFNESS RIGHT EAR Cardiovascular Medical History: Positive for: Hypertension Gastrointestinal Medical History: Past Medical History Notes: Melena Endocrine [name], [name]  ([id number]) [id number] Page 8 of 9 Medical History: Positive for: Type II Diabetes Past Medical History Notes: Hypothyroidism Hyperlipidemia associated with type 2 diabetes mellitus ([place]) Treated with: Diet Immunological Medical History: Negative for: Lupus Erythematosus; Raynauds; Scleroderma Musculoskeletal Medical History: Past Medical History Notes: Closed T12 fracture ([place]) Hip fracture ([place]) Closed comminuted intertrochanteric fracture of proximal end of right femur [facility]) Oncologic Medical History: Negative for: Received Chemotherapy; Received Radiation Immunizations Pneumococcal Vaccine: Received Pneumococcal Vaccination: Yes Received Pneumococcal Vaccination On or After 60th Birthday: No Implantable Devices None Hospitalization / Surgery History Type of Hospitalization/Surgery t/11 t/12 FRACTURE/SURGERY FEMUR FX SURGERY TOTAL HYSTERECTOMY CHOLECYSTECTOMY TONSILLECTOMY Family and Social History Unknown History: Yes; Never smoker; Marital Status - Married; Alcohol Use: Never; Drug Use: No History; Caffeine Use: Rarely; Financial Concerns: No; Food, Clothing or Shelter Needs: No; Support System Lacking: No; Transportation Concerns: No Electronic Signature(s) Signed: 07/16/2022 4:19:05 PM By: Fredirick Maudlin MD FACS Entered By: Fredirick Maudlin on 07/16/2022 15:35:44 -------------------------------------------------------------------------------- SuperBill Details Patient Name: Date of Service: Marijean Heath 07/16/2022 Medical Record Number: [id number] Patient Account Number: [id number] Date of Birth/Sex: Treating RN: Jan 03, 1923 (88 y.o. Elam Dutch Primary Care Provider: [name] Other Clinician: Referring Provider: Treating Provider/Extender: Nicholaus Corolla in Treatment: 29 Diagnosis Coding ICD-10 Codes Code Description L89.104 Pressure ulcer of unspecified part of back, stage 4 E11.622  Type 2 diabetes mellitus with other skin ulcer S22.089S Unspecified fracture  of T11-T12 vertebra, sequela [name], TROLINGER ([id number]) [id number] Page 9 of 9 Facility Procedures : CPT4 Code: [id number] Description: 69629 - WOUND CARE VISIT-LEV 3 EST PT Modifier: Quantity: 1 Physician Procedures : CPT4 Code Description Modifier [id number] - WC PHYS LEVEL 4 - EST PT ICD-10 Diagnosis Description L89.104 Pressure ulcer of unspecified part of back, stage 4 E11.622 Type 2 diabetes mellitus with other skin ulcer S22.089S Unspecified fracture of  T11-T12 vertebra, sequela Quantity: 1 Electronic Signature(s) Signed: 07/16/2022 3:36:49 PM By: Fredirick Maudlin MD FACS Entered By: Fredirick Maudlin on 07/16/2022 15:36:49

## 2022-07-16 NOTE — Progress Notes (Signed)
REAH, JUSTO ([id number]) [id number] Page 1 of 9 Visit Report for 07/16/2022 Arrival Information Details Patient Name: Date of Service: XYLINA, RHOADS 07/16/2022 2:45 PM Medical Record Number: [id number] Patient Account Number: [id number] Date of Birth/Sex: Treating RN: 1923-04-02 (88 y.o. Elam Dutch Primary Care Mohd. Derflinger: [name] Other Clinician: Referring Floree Zuniga: Treating Allysen Lazo/Extender: Nicholaus Corolla in Treatment: 29 Visit Information History Since Last Visit Added or deleted any medications: No Patient Arrived: Wheel Chair Any new allergies or adverse reactions: No Arrival Time: 15:00 Had a fall or experienced change in No Accompanied By: daughter activities of daily living that may affect Transfer Assistance: Manual risk of falls: Patient Identification Verified: Yes Signs or symptoms of abuse/neglect since last visito No Secondary Verification Process Completed: Yes Hospitalized since last visit: No Patient Requires Transmission-Based Precautions: No Implantable device outside of the clinic excluding No Patient Has Alerts: No cellular tissue based products placed in the center since last visit: Has Dressing in Place as Prescribed: Yes Pain Present Now: No Electronic Signature(s) Signed: 07/16/2022 4:29:03 PM By: Baruch Gouty RN, BSN Entered By: Baruch Gouty on 07/16/2022 15:07:40 -------------------------------------------------------------------------------- Clinic Level of Care Assessment Details Patient Name: Date of Service: ELINORA, WEIGAND 07/16/2022 2:45 PM Medical Record Number: [id number] Patient Account Number: [id number] Date of Birth/Sex: Treating RN: Aug 23, 1923 (88 y.o. Elam Dutch Primary Care Junie Engram: [name] Other Clinician: Referring Liliyana Thobe: Treating Louana Fontenot/Extender: Nicholaus Corolla in Treatment: 29 Clinic Level of Care  Assessment Items TOOL 4 Quantity Score []  - 0 Use when only an EandM is performed on FOLLOW-UP visit ASSESSMENTS - Nursing Assessment / Reassessment X- 1 10 Reassessment of Co-morbidities (includes updates in patient status) X- 1 5 Reassessment of Adherence to Treatment Plan ASSESSMENTS - Wound and Skin A ssessment / Reassessment X - Simple Wound Assessment / Reassessment - one wound 1 5 []  - 0 Complex Wound Assessment / Reassessment - multiple wounds []  - 0 Dermatologic / Skin Assessment (not related to wound area) ASSESSMENTS - Focused Assessment []  - 0 Circumferential Edema Measurements - multi extremities []  - 0 Nutritional Assessment / Counseling / Intervention MERRISSA, GIACOBBE ([id number]) [id number] Page 2 of 9 []  - 0 Lower Extremity Assessment (monofilament, tuning fork, pulses) []  - 0 Peripheral Arterial Disease Assessment (using hand held doppler) ASSESSMENTS - Ostomy and/or Continence Assessment and Care []  - 0 Incontinence Assessment and Management []  - 0 Ostomy Care Assessment and Management (repouching, etc.) PROCESS - Coordination of Care X - Simple Patient / Family Education for ongoing care 1 15 []  - 0 Complex (extensive) Patient / Family Education for ongoing care X- 1 10 Staff obtains Programmer, systems, Records, T Results / Process Orders est []  - 0 Staff telephones HHA, Nursing Homes / Clarify orders / etc []  - 0 Routine Transfer to another Facility (non-emergent condition) []  - 0 Routine Hospital Admission (non-emergent condition) []  - 0 New Admissions / Biomedical engineer / Ordering NPWT Apligraf, etc. , []  - 0 Emergency Hospital Admission (emergent condition) X- 1 10 Simple Discharge Coordination []  - 0 Complex (extensive) Discharge Coordination PROCESS - Special Needs []  - 0 Pediatric / Minor Patient Management []  - 0 Isolation Patient Management []  - 0 Hearing / Language / Visual special needs []  - 0 Assessment  of Community assistance (transportation, D/C planning, etc.) []  - 0 Additional assistance / Altered mentation []  - 0 Support Surface(s) Assessment (bed, cushion, seat, etc.) INTERVENTIONS - Wound Cleansing / Measurement X - Simple Wound  Cleansing - one wound 1 5 []  - 0 Complex Wound Cleansing - multiple wounds X- 1 5 Wound Imaging (photographs - any number of wounds) []  - 0 Wound Tracing (instead of photographs) []  - 0 Simple Wound Measurement - one wound X- 1 5 Complex Wound Measurement - multiple wounds INTERVENTIONS - Wound Dressings X - Small Wound Dressing one or multiple wounds 1 10 []  - 0 Medium Wound Dressing one or multiple wounds []  - 0 Large Wound Dressing one or multiple wounds X- 1 5 Application of Medications - topical []  - 0 Application of Medications - injection INTERVENTIONS - Miscellaneous []  - 0 External ear exam []  - 0 Specimen Collection (cultures, biopsies, blood, body fluids, etc.) []  - 0 Specimen(s) / Culture(s) sent or taken to Lab for analysis []  - 0 Patient Transfer (multiple staff / Civil Service fast streamer / Similar devices) []  - 0 Simple Staple / Suture removal (25 or less) []  - 0 Complex Staple / Suture removal (26 or more) []  - 0 Hypo / Hyperglycemic Management (close monitor of Blood Glucose) ELAYNE, GRUVER B ([id number]) [id number] Page 3 of 9 []  - 0 Ankle / Brachial Index (ABI) - do not check if billed separately X- 1 5 Vital Signs Has the patient been seen at the hospital within the last three years: Yes Total Score: 90 Level Of Care: New/Established - Level 3 Electronic Signature(s) Signed: 07/16/2022 4:29:03 PM By: Baruch Gouty RN, BSN Entered By: Baruch Gouty on 07/16/2022 15:26:00 -------------------------------------------------------------------------------- Encounter Discharge Information Details Patient Name: Date of Service: [name] 07/16/2022 2:45 PM Medical Record Number:  [id number] Patient Account Number: [id number] Date of Birth/Sex: Treating RN: 1923/06/17 (88 y.o. Elam Dutch Primary Care Malala Trenkamp: [name] Other Clinician: Referring Sonji Starkes: Treating Nidhi Jacome/Extender: Nicholaus Corolla in Treatment: 29 Encounter Discharge Information Items Discharge Condition: Stable Ambulatory Status: Wheelchair Discharge Destination: Home Transportation: Private Auto Accompanied By: daughter Schedule Follow-up Appointment: Yes Clinical Summary of Care: Patient Declined Electronic Signature(s) Signed: 07/16/2022 4:29:03 PM By: Baruch Gouty RN, BSN Entered By: Baruch Gouty on 07/16/2022 15:35:01 -------------------------------------------------------------------------------- Lower Extremity Assessment Details Patient Name: Date of Service: HA LARUTH, HANGER 07/16/2022 2:45 PM Medical Record Number: [id number] Patient Account Number: [id number] Date of Birth/Sex: Treating RN: 09-20-1923 (88 y.o. Elam Dutch Primary Care Canna Nickelson: [name] Other Clinician: Referring Nance Mccombs: Treating Anjelika Ausburn/Extender: Nicholaus Corolla in Treatment: 29 Electronic Signature(s) Signed: 07/16/2022 4:29:03 PM By: Baruch Gouty RN, BSN Entered By: Baruch Gouty on 07/16/2022 15:08:32 -------------------------------------------------------------------------------- Multi Wound Chart Details Patient Name: Date of Service: [name] 07/16/2022 2:45 PM Medical Record Number: [id number] Patient Account Number: [id number] AVIAN, GREENAWALT ([id number]) [id number] Page 4 of 9 Date of Birth/Sex: Treating RN: Feb 11, 1923 (88 y.o. F) Primary Care Aunesty Tyson: Other Clinician: [name] Referring Leron Stoffers: Treating Chera Slivka/Extender: Nicholaus Corolla in Treatment: 29 Vital Signs Height(in): 62 Pulse(bpm): 90 Weight(lbs): 110 Blood Pressure(mmHg):  144/74 Body Mass Index(BMI): 20.1 Temperature(F): 97.7 Respiratory Rate(breaths/min): 18 [1:Photos:] [N/A:N/A] Thoracic spine N/A N/A Wound Location: Gradually Appeared N/A N/A Wounding Event: Pressure Ulcer N/A N/A Primary Etiology: Hypertension, Type II Diabetes N/A N/A Comorbid History: 12/19/2020 N/A N/A Date Acquired: 61 N/A N/A Weeks of Treatment: Open N/A N/A Wound Status: No N/A N/A Wound Recurrence: 0.9x0.8x0.8 N/A N/A Measurements L x W x D (cm) 0.565 N/A N/A A (cm) : rea 0.452 N/A N/A Volume (cm) : 83.70% N/A N/A % Reduction in A rea: 91.30% N/A N/A % Reduction in Volume:  12 Starting Position 1 (o'clock): 3 Ending Position 1 (o'clock): 4.2 Maximum Distance 1 (cm): Yes N/A N/A Undermining: Category/Stage IV N/A N/A Classification: Medium N/A N/A Exudate A mount: Serosanguineous N/A N/A Exudate Type: red, brown N/A N/A Exudate Color: Well defined, not attached N/A N/A Wound Margin: Large (67-100%) N/A N/A Granulation A mount: Red, Pink N/A N/A Granulation Quality: Small (1-33%) N/A N/A Necrotic A mount: Fat Layer (Subcutaneous Tissue): Yes N/A N/A Exposed Structures: Fascia: No Tendon: No Muscle: No Joint: No Bone: No Small (1-33%) N/A N/A Epithelialization: No Abnormalities Noted N/A N/A Periwound Skin Texture: No Abnormalities Noted N/A N/A Periwound Skin Moisture: No Abnormalities Noted N/A N/A Periwound Skin Color: No Abnormality N/A N/A Temperature: Treatment Notes Wound #1 (Thoracic spine) Cleanser Wound Cleanser Discharge Instruction: Cleanse the wound with wound cleanser prior to applying a clean dressing using gauze sponges, not tissue or cotton balls. Peri-Wound Care Skin Prep Discharge Instruction: Use skin prep as directed Topical Primary Dressing Iodoform packing strip 1/2 (in) Discharge Instruction: Lightly pack into wound especially into underminig from 12-3 o'clock ROMANDA, TURRUBIATES B ([id number])  [id number] Page 5 of 9 Secondary Dressing Zetuvit Plus Silicone Border Dressing 4x4 (in/in) Discharge Instruction: Apply silicone border over primary dressing as directed. Secured With Compression Wrap Compression Stockings Environmental education officer) Signed: 07/16/2022 3:34:58 PM By: Fredirick Maudlin MD FACS Entered By: Fredirick Maudlin on 07/16/2022 15:34:57 -------------------------------------------------------------------------------- Multi-Disciplinary Care Plan Details Patient Name: Date of Service: Marijean Heath 07/16/2022 2:45 PM Medical Record Number: [id number] Patient Account Number: [id number] Date of Birth/Sex: Treating RN: [birth year] (88 y.o. Elam Dutch Primary Care Evalyne Cortopassi: [name] Other Clinician: Referring Adaijah Endres: Treating Kamalei Roeder/Extender: Nicholaus Corolla in Treatment: 29 Multidisciplinary Care Plan reviewed with physician Active Inactive Pressure Nursing Diagnoses: Knowledge deficit related to causes and risk factors for pressure ulcer development Knowledge deficit related to management of pressures ulcers Potential for impaired tissue integrity related to pressure, friction, moisture, and shear Goals: Patient will remain free of pressure ulcers Date Initiated: 12/25/2021 Date Inactivated: 04/11/2022 Target Resolution Date: 04/06/2022 Goal Status: Met Patient/caregiver will verbalize understanding of pressure ulcer management Date Initiated: 12/25/2021 Target Resolution Date: 08/03/2022 Goal Status: Active Interventions: Assess: immobility, friction, shearing, incontinence upon admission and as needed Assess offloading mechanisms upon admission and as needed Notes: Wound/Skin Impairment Nursing Diagnoses: Impaired tissue integrity Knowledge deficit related to ulceration/compromised skin integrity Goals: Patient will have a decrease in wound volume by X% from date: (specify in  notes) Date Initiated: 12/20/2021 Date Inactivated: 01/08/2022 Target Resolution Date: 01/12/2022 Goal Status: Unmet Unmet Reason: pressure relief Patient/caregiver will verbalize understanding of skin care regimen Date Initiated: 12/20/2021 Target Resolution Date: 08/03/2022 Goal Status: Active Ulcer/skin breakdown will have a volume reduction of 30% by week 4 Date Initiated: 12/20/2021 Date Inactivated: 04/11/2022 Target Resolution Date: 04/06/2022 Goal Status: Unmet Unmet Reason: tyneshia, stivers ([id number]) [id number] Page 6 of 9 Ulcer/skin breakdown will have a volume reduction of 50% by week 8 Date Initiated: 12/20/2021 Date Inactivated: 04/11/2022 Target Resolution Date: 04/06/2022 Unmet Reason: osteo, severe Goal Status: Unmet kyphosis Interventions: Assess patient/caregiver ability to obtain necessary supplies Assess patient/caregiver ability to perform ulcer/skin care regimen upon admission and as needed Assess ulceration(s) every visit Notes: Electronic Signature(s) Signed: 07/16/2022 4:29:03 PM By: Baruch Gouty RN, BSN Entered By: Baruch Gouty on 07/16/2022 15:20:27 -------------------------------------------------------------------------------- Pain Assessment Details Patient Name: Date of Service: [name] 07/16/2022 2:45 PM Medical Record Number: [id number] Patient Account Number: [id number] Date of Birth/Sex:  Treating RN: May 05, 1923 (88 y.o. Elam Dutch Primary Care Britain Saber: [name] Other Clinician: Referring Latron Ribas: Treating Sahej Hauswirth/Extender: Nicholaus Corolla in Treatment: 29 Active Problems Location of Pain Severity and Description of Pain Patient Has Paino No Site Locations Rate the pain. Current Pain Level: 0 Character of Pain Describe the Pain: Tender Pain Management and Medication Current Pain Management: Electronic Signature(s) Signed: 07/16/2022 4:29:03 PM By:  Baruch Gouty RN, BSN Entered By: Baruch Gouty on 07/16/2022 15:08:23 [name] ([id number]) [id number] Page 7 of 9 -------------------------------------------------------------------------------- Patient/Caregiver Education Details Patient Name: Date of Service: HA PHELICIA, DANTES 10/9/2023andnbsp2:45 PM Medical Record Number: [id number] Patient Account Number: [id number] Date of Birth/Gender: Treating RN: [birth year] (88 y.o. Elam Dutch Primary Care Physician: [name] Other Clinician: Referring Physician: Treating Physician/Extender: Nicholaus Corolla in Treatment: 29 Education Assessment Education Provided To: Patient Education Topics Provided Pressure: Methods: Explain/Verbal Responses: Reinforcements needed, State content correctly Wound/Skin Impairment: Methods: Explain/Verbal Responses: Reinforcements needed, State content correctly Electronic Signature(s) Signed: 07/16/2022 4:29:03 PM By: Baruch Gouty RN, BSN Entered By: Baruch Gouty on 07/16/2022 15:20:51 -------------------------------------------------------------------------------- Wound Assessment Details Patient Name: Date of Service: Marijean Heath 07/16/2022 2:45 PM Medical Record Number: [id number] Patient Account Number: [id number] Date of Birth/Sex: Treating RN: 1922-11-19 (88 y.o. Elam Dutch Primary Care Julyana Woolverton: [name] Other Clinician: Referring Tosh Glaze: Treating Bodhi Moradi/Extender: Nicholaus Corolla in Treatment: 29 Wound Status Wound Number: 1 Primary Etiology: Pressure Ulcer Wound Location: Thoracic spine Wound Status: Open Wounding Event: Gradually Appeared Comorbid History: Hypertension, Type II Diabetes Date Acquired: 12/19/2020 Weeks Of Treatment: 29 Clustered Wound: No Photos ABISOLA, CARRERO ([id number]) [id number] Page 8 of 9 Wound  Measurements Length: (cm) 0.9 Width: (cm) 0.8 Depth: (cm) 0.8 Area: (cm) 0.565 Volume: (cm) 0.452 % Reduction in Area: 83.7% % Reduction in Volume: 91.3% Epithelialization: Small (1-33%) Tunneling: No Undermining: Yes Starting Position (o'clock): 12 Ending Position (o'clock): 3 Maximum Distance: (cm) 4.2 Wound Description Classification: Category/Stage IV Wound Margin: Well defined, not attached Exudate Amount: Medium Exudate Type: Serosanguineous Exudate Color: red, brown Foul Odor After Cleansing: No Slough/Fibrino Yes Wound Bed Granulation Amount: Large (67-100%) Exposed Structure Granulation Quality: Red, Pink Fascia Exposed: No Necrotic Amount: Small (1-33%) Fat Layer (Subcutaneous Tissue) Exposed: Yes Necrotic Quality: Adherent Slough Tendon Exposed: No Muscle Exposed: No Joint Exposed: No Bone Exposed: No Periwound Skin Texture Texture Color No Abnormalities Noted: Yes No Abnormalities Noted: Yes Moisture Temperature / Pain No Abnormalities Noted: Yes Temperature: No Abnormality Treatment Notes Wound #1 (Thoracic spine) Cleanser Wound Cleanser Discharge Instruction: Cleanse the wound with wound cleanser prior to applying a clean dressing using gauze sponges, not tissue or cotton balls. Peri-Wound Care Skin Prep Discharge Instruction: Use skin prep as directed Topical Primary Dressing Iodoform packing strip 1/2 (in) Discharge Instruction: Lightly pack into wound especially into underminig from 12-3 o'clock Secondary Dressing Zetuvit Plus Silicone Border Dressing 4x4 (in/in) Discharge Instruction: Apply silicone border over primary dressing as directed. Secured With Compression Wrap Compression Stockings Environmental education officer) Signed: 07/16/2022 4:29:03 PM By: Baruch Gouty RN, BSN Entered By: Baruch Gouty on 07/16/2022 15:17:19 [name] ([id number]) [id number] Page 9 of  9 -------------------------------------------------------------------------------- Vitals Details Patient Name: Date of Service: MIYA, LUVIANO 07/16/2022 2:45 PM Medical Record Number: [id number] Patient Account Number: [id number] Date of Birth/Sex: Treating RN: [birth year] (88 y.o. Elam Dutch Primary Care Zoraya Fiorenza: [name] Other Clinician: Referring Holle Sprick: Treating Siera Beyersdorf/Extender: Nicholaus Corolla in Treatment: 29  Vital Signs Time Taken: 15:07 Temperature (F): 97.7 Height (in): 62 Pulse (bpm): 90 Weight (lbs): 110 Respiratory Rate (breaths/min): 18 Body Mass Index (BMI): 20.1 Blood Pressure (mmHg): 144/74 Reference Range: 80 - 120 mg / dl Electronic Signature(s) Signed: 07/16/2022 4:29:03 PM By: Baruch Gouty RN, BSN Entered By: Baruch Gouty on 07/16/2022 15:08:12

## 2022-07-30 ENCOUNTER — Encounter (HOSPITAL_BASED_OUTPATIENT_CLINIC_OR_DEPARTMENT_OTHER): Payer: Medicare Other | Admitting: General Surgery

## 2022-07-30 DIAGNOSIS — E11622 Type 2 diabetes mellitus with other skin ulcer: Secondary | ICD-10-CM | POA: Diagnosis not present

## 2022-07-30 NOTE — Progress Notes (Signed)
ADELYNNE, [name] ([id number]) [id number] Page 1 of 9 Visit Report for 07/30/2022 Chief Complaint Document Details Patient Name: Date of Service: [name], [name] 07/30/2022 1:15 PM Medical Record Number: [id number] Patient Account Number: [id number] Date of Birth/Sex: Treating RN: 08-16-1923 (88 y.o. [name] Primary Care Provider: [name] Other Clinician: Referring Provider: Treating Provider/Extender: Nicholaus Corolla in Treatment: 31 Information Obtained from: Patient Chief Complaint Patient is at the clinic for treatment of an open pressure ulcer Electronic Signature(s) Signed: 07/30/2022 1:55:36 PM By: Fredirick Maudlin MD FACS Entered By: Fredirick Maudlin on 07/30/2022 13:55:36 -------------------------------------------------------------------------------- HPI Details Patient Name: Date of Service: Stacy [name] 07/30/2022 1:15 PM Medical Record Number: [id number] Patient Account Number: [id number] Date of Birth/Sex: Treating RN: Aug 05, 1923 (88 y.o. [name] Primary Care Provider: [name] Other Clinician: Referring Provider: Treating Provider/Extender: Nicholaus Corolla in Treatment: 31 History of Present Illness HPI Description: ADMISSION 12/19/21 This is a 88 year old woman who is presenting to clinic today with a an open ulcer over her thoracic spine. She has severe kyphosis and a history of prior T12 fracture, as well as type 2 diabetes mellitus. The wound has been present for about a year. She is accompanied by her daughters, 1 of whom is a Marine scientist. They have been trying to offload the site by using pillows and supports to keep returned while in bed, and an eggcrate foam with a cut out for the wound for when she is sitting up. They have been applying Santyl to the site. She does have home health and the provider took a culture which was positive for Pseudomonas. She  was on ciprofloxacin for this. A recent repeat culture was negative. Most recent hemoglobin A1c was 6.5, in February. A C-reactive protein also obtained in February was elevated at 24.1. No imaging has been performed of the site. The patient denies significant pain. 12/25/2021: Last week, I took a bone biopsy as well as culture. Fortunately, the bone biopsy was negative for osteomyelitis. The culture returned with rare corynebacterium and rare Enterococcus faecalis. We have been using Santyl and Hydrofera Blue. T oday, the wound appears cleaner and there are buds of granulation tissue forming around the perimeter. 01/01/2022: Due to the positive culture, we switched to topical gentamicin. Today, she has had a little bit of a slough accumulation. The wound continues to have buds of granulation tissue forming. Apparently, the Hydrofera Blue was placed just over the wound opening rather than down in to contact the surface. No purulent drainage or odor. 01/08/2022: Last week, there was more slough in the wound bed, so I changed back to Santyl. There is now hypertrophic granulation tissue around the wound opening. Once again, we seem to be having some difficulty getting the Hydrofera Blue tucked up into the wound; there is undermining that extends for 3 to 4 cm at the 12 o'clock position and the Hydrofera Blue is simply sitting on top of the wound without being in contact with the surface. Bone is still palpable, but tissue is beginning to close in over it. 01/22/2022: The wound looks much cleaner this week and the hypertrophic granulation tissue responded appropriately to silver nitrate application. She continues to have undermining for about 4 cm from 12:00 to 4:00. I can still feel the bone in the base at about the 2 o'clock position, but it is less prominent and tissue is closing over. We have been using Santyl and Hydrofera Blue. The daughter that accompanies  the patient today indicates that they have been  very diligent in making sure the [facility] is tucked under and into the full base of the wound. 02/05/2022: The wound overall is improved. The undermining is about the same but there is less bone palpable. We have been using Santyl and Hydrofera Blue. Minimal slough accumulation in the 2 weeks since her last visit. [name], [name] ([id number]) [id number] Page 2 of 9 02/19/2022: The visible wound surface is very clean without any slough accumulation. Although I can feel bone in the undermined portion of the wound, it is now covered with a layer of tissue. The undermined area is about the same size, however. We have been using Santyl with Hydrofera Blue. She has been approved for snap VAC use. 02/26/2022: Last week, we applied a snap VAC. The wound dimensions have come in by a bit in all directions. The VAC canister did fill, however, and was starting to leak so the patient's daughter removed the VAC and dressed the wound as we had been previously. There is a little bit of thin slough on the wound surface with some senescent tissue at the inferior margin. Bone is still palpable but covered with a layer of tissue. No odor. 03/06/2022: We continue to have technical difficulties with the snap VAC which resulted in the patient's family removing it and going back to the dressing changes with Hydrofera Blue. It sounds like there are issues maintaining suction and the drape is getting bunched up creating a leak. The wound measured larger today, but the intake nurse thinks that perhaps her measurements were taken slightly differently than last week's. There is just some thin slough on the wound surface as well as some hypertrophic granulation tissue on the lateral border. 03/13/2022: The snap VAC worked much better this week and only began leaking yesterday. At that point, the patient's family removed it and applied Hydrofera Blue to her wound. Today, the tissue coverage over the bone  that had been exposed is more substantial. She still has undermining present. There is also some senescent skin around the wound orifice along with some hypertrophic granulation tissue. 03/20/2022: The snap VAC unfortunately failed on Friday. There has really been no significant change to the wound for several weeks. The degree of undermining is unchanged. The surface is clean. 03/27/2022: Once again, the snap VAC failed prematurely and had to be removed on Saturday. The undermining remains the same. There is more tissue coverage of the previously exposed bone. The wound surface has just a light layer of biofilm and thin slough. She was approved for a standard wound VAC, but there are currently no home health agencies able to staff dressing changes for her; it is too difficult for her and her family to bring her to the wound center more than once a week. 04/03/2022: The snap VAC stayed on but the canister filled up on Sunday. The direct depth of the wound is a little bit shallower but the undermining has not really changed. There is some slightly discolored, darker tissue at the 9 o'clock position. No odor or purulent drainage. 04/11/2022: Using 2 canisters worked well and the snap VAC lasted until yesterday. She has her standard wound VAC with her today. The orifice of the wound has contracted somewhat. There is still substantial undermining at 12:00, but the undermined portion to the right between 2 and 4:00 has closed and somewhat. The previously exposed bone is no longer even palpable under the overlying tissue. No concern for  infection. 04/17/2022: A standard VAC dressing was initiated last week. The family has been changing it at home, due to the inability to secure home health services. It is a little bit challenging to apply, due to the small orifice of the wound with substantial undermining. Today, the undermining at 12:00 seems like it might be a little bit deeper, but the 2-4 o'clock undermining  is decreased. There is excellent tissue coverage over the previously exposed bone. There is a little bit of slough accumulation at the orifice, along with some senescent skin. 04/23/2022: The wound VAC was not changed all week due to the family being overwhelmed with other issues. Fortunately, there does not seem to have been any untoward effects from this. The orifice of the wound remains unchanged but the undermining and tunneling has narrowed to just 1 area at 12:00. No purulent drainage or odor. Good tissue coverage of the previously exposed bone. 04/30/2022: The tunneling has narrowed substantially. There is no purulent drainage or odor from the wound. The orifice of the wound is about the same size and there is some hypertrophic granulation tissue at the opening. 05/07/2022: The undermining has come in substantially. The only particularly deep area is from 1-2 o'clock. The rest of the wound is clean without any purulent drainage or odor. 05/14/2022: The orifice of the wound has contracted further. She continues to have just under 5 cm of tunneling at 12:00, but the wound is clean without any purulent drainage or odor. 05/21/2022: The tunnel has come in by about half a centimeter. The wound is clean without any slough. 05/28/2022: The tunnel continues to contract. The wound is clean with good granulation tissue at the base. No concern for infection. 06/04/2022: The tunnel is about the same length this week, but it is quite a bit narrower. The wound is clean with good granulation tissue. Unfortunately, her family applied the track pad directly over the sponge without any intervening drape and without bridging it away from her spine so she has a small ulcer from the track pad just caudal to the wound. 06/12/2022: The small ulcer from the track pad that was present last week has healed. Otherwise the wound is basically unchanged. 06/18/2022: No real change to the tunneling. The surface of the wound is  clean. 06/25/2022: We discontinued the wound VAC last week and has been packing with iodoform packing strips. The tunneling has come in by about half a centimeter. The wound is clean. 07/02/2022: There has really been no change to the wound over the past week. There is a little hypertrophic granulation tissue at the orifice but otherwise the tunneling is about the same and the wound is clean. 07/16/2022: The tunneling has come in by a couple of millimeters. The wound is clean otherwise. 07/30/2022: The tunnel has come in by half a centimeter. The wound is clean. Electronic Signature(s) Signed: 07/30/2022 1:56:18 PM By: Fredirick Maudlin MD FACS Entered By: Fredirick Maudlin on 07/30/2022 13:56:17 -------------------------------------------------------------------------------- Physical Exam Details Patient Name: Date of Service: [name] 07/30/2022 1:15 PM Medical Record Number: [id number] Patient Account Number: [id number] [name], [name] ([id number]) [id number].pdf Page 3 of 9 Date of Birth/Sex: Treating RN: [birth year] (88 y.o. [name] Primary Care Provider: Other Clinician: [name] Referring Provider: Treating Provider/Extender: Nicholaus Corolla in Treatment: 31 Constitutional . Slightly tachycardic, asymptomatic. . . No acute distress.Marland Kitchen Respiratory Normal work of breathing on room air.. Notes 07/30/2022: The tunnel has come in by half a centimeter.  The wound is clean. Electronic Signature(s) Signed: 07/30/2022 1:56:45 PM By: Fredirick Maudlin MD FACS Entered By: Fredirick Maudlin on 07/30/2022 13:56:45 -------------------------------------------------------------------------------- Physician Orders Details Patient Name: Date of Service: [name], [name] 07/30/2022 1:15 PM Medical Record Number: [id number] Patient Account Number: [id number] Date of Birth/Sex: Treating RN: 1922-12-08 (88 y.o. [name] Primary Care Provider: [name] Other Clinician: Referring Provider: Treating Provider/Extender: Nicholaus Corolla in Treatment: 31 Verbal / Phone Orders: No Diagnosis Coding ICD-10 Coding Code Description L89.104 Pressure ulcer of unspecified part of back, stage 4 E11.622 Type 2 diabetes mellitus with other skin ulcer S22.089S Unspecified fracture of T11-T12 vertebra, sequela Follow-up Appointments ppointment in 2 weeks. - Dr Celine Ahr - Room 1 - with Vaughan Basta Return A Anesthetic Wound #1 Thoracic spine (In clinic) Topical Lidocaine 4% applied to wound bed Bathing/ Shower/ Hygiene May shower with protection but do not get wound dressing(s) wet. Off-Loading Turn and reposition every 2 hours Other: - KEEP PRESSURE OFF OF BACK MAY USE EGG CRATE CUSHION Additional Orders / Instructions Follow Nutritious Diet Wound Treatment Wound #1 - Thoracic spine Cleanser: Wound Cleanser (Generic) 1 x Per Day/30 Days Discharge Instructions: Cleanse the wound with wound cleanser prior to applying a clean dressing using gauze sponges, not tissue or cotton balls. Cleanser: [place] Ancillary Kit - 15 Day Supply (DME) (Generic) 1 x Per Day/30 Days Discharge Instructions: Use supplies as instructed; Kit contains: (15) Saline Bullets; (15) 3x3 Gauze; 15 pr Gloves Cleanser: cotton tipped applicators 6" (DME) (Generic) 1 x Per Day/30 Days Peri-Wound Care: Skin Prep 1 x Per Day/30 Days [name], [name] ([id number]) [id number] Page 4 of 9 Discharge Instructions: Use skin prep as directed Prim Dressing: Promogran Prisma Matrix, 4.34 (sq in) (silver collagen) (DME) (Dispense As Written) 1 x Per Day/30 Days ary Discharge Instructions: Moisten collagen with saline or hydrogel Prim Dressing: Iodoform packing strip 1/2 (in) (DME) (Generic) 1 x Per Day/30 Days ary Discharge Instructions: Lightly pack into wound especially into underminig from 12-3  o'clock Secondary Dressing: Zetuvit Plus Silicone Border Dressing 4x4 (in/in) (DME) (Generic) 1 x Per Day/30 Days Discharge Instructions: Apply silicone border over primary dressing as directed. Electronic Signature(s) Signed: 07/30/2022 4:56:05 PM By: Fredirick Maudlin MD FACS Entered By: Fredirick Maudlin on 07/30/2022 13:57:01 -------------------------------------------------------------------------------- Problem List Details Patient Name: Date of Service: [name], [name] 07/30/2022 1:15 PM Medical Record Number: [id number] Patient Account Number: [id number] Date of Birth/Sex: Treating RN: 1922/10/26 (88 y.o. [name] Primary Care Provider: [name] Other Clinician: Referring Provider: Treating Provider/Extender: Nicholaus Corolla in Treatment: 31 Active Problems ICD-10 Encounter Code Description Active Date MDM Diagnosis L89.104 Pressure ulcer of unspecified part of back, stage 4 12/19/2021 No Yes E11.622 Type 2 diabetes mellitus with other skin ulcer 12/19/2021 No Yes S22.089S Unspecified fracture of T11-T12 vertebra, sequela 12/19/2021 No Yes Inactive Problems Resolved Problems Electronic Signature(s) Signed: 07/30/2022 1:41:58 PM By: Fredirick Maudlin MD FACS Entered By: Fredirick Maudlin on 07/30/2022 13:41:58 -------------------------------------------------------------------------------- Progress Note Details Patient Name: Date of Service: [name], [name] 07/30/2022 1:15 PM Medical Record Number: [id number] Patient Account Number: [id number] Date of Birth/Sex: Treating RN: 05/02/1923 (88 y.o. [name] Primary Care Provider: [name] Other Clinician: [name] ([id number]) [id number] Page 5 of 9 Referring Provider: Treating Provider/Extender: Nicholaus Corolla in Treatment: 31 Subjective Chief Complaint Information obtained from Patient Patient is at the  clinic for treatment of an open pressure ulcer History of Present Illness (  HPI) ADMISSION 12/19/21 This is a 88 year old woman who is presenting to clinic today with a an open ulcer over her thoracic spine. She has severe kyphosis and a history of prior T12 fracture, as well as type 2 diabetes mellitus. The wound has been present for about a year. She is accompanied by her daughters, 1 of whom is a Marine scientist. They have been trying to offload the site by using pillows and supports to keep returned while in bed, and an eggcrate foam with a cut out for the wound for when she is sitting up. They have been applying Santyl to the site. She does have home health and the provider took a culture which was positive for Pseudomonas. She was on ciprofloxacin for this. A recent repeat culture was negative. Most recent hemoglobin A1c was 6.5, in February. A C-reactive protein also obtained in February was elevated at 24.1. No imaging has been performed of the site. The patient denies significant pain. 12/25/2021: Last week, I took a bone biopsy as well as culture. Fortunately, the bone biopsy was negative for osteomyelitis. The culture returned with rare corynebacterium and rare Enterococcus faecalis. We have been using Santyl and Hydrofera Blue. T oday, the wound appears cleaner and there are buds of granulation tissue forming around the perimeter. 01/01/2022: Due to the positive culture, we switched to topical gentamicin. Today, she has had a little bit of a slough accumulation. The wound continues to have buds of granulation tissue forming. Apparently, the Hydrofera Blue was placed just over the wound opening rather than down in to contact the surface. No purulent drainage or odor. 01/08/2022: Last week, there was more slough in the wound bed, so I changed back to Santyl. There is now hypertrophic granulation tissue around the wound opening. Once again, we seem to be having some difficulty getting the Hydrofera Blue  tucked up into the wound; there is undermining that extends for 3 to 4 cm at the 12 o'clock position and the Hydrofera Blue is simply sitting on top of the wound without being in contact with the surface. Bone is still palpable, but tissue is beginning to close in over it. 01/22/2022: The wound looks much cleaner this week and the hypertrophic granulation tissue responded appropriately to silver nitrate application. She continues to have undermining for about 4 cm from 12:00 to 4:00. I can still feel the bone in the base at about the 2 o'clock position, but it is less prominent and tissue is closing over. We have been using Santyl and Hydrofera Blue. The daughter that accompanies the patient today indicates that they have been very diligent in making sure the [facility] is tucked under and into the full base of the wound. 02/05/2022: The wound overall is improved. The undermining is about the same but there is less bone palpable. We have been using Santyl and Hydrofera Blue. Minimal slough accumulation in the 2 weeks since her last visit. 02/19/2022: The visible wound surface is very clean without any slough accumulation. Although I can feel bone in the undermined portion of the wound, it is now covered with a layer of tissue. The undermined area is about the same size, however. We have been using Santyl with Hydrofera Blue. She has been approved for snap VAC use. 02/26/2022: Last week, we applied a snap VAC. The wound dimensions have come in by a bit in all directions. The VAC canister did fill, however, and was starting to leak so the patient's daughter removed the VAC and  dressed the wound as we had been previously. There is a little bit of thin slough on the wound surface with some senescent tissue at the inferior margin. Bone is still palpable but covered with a layer of tissue. No odor. 03/06/2022: We continue to have technical difficulties with the snap VAC which resulted in the patient's family  removing it and going back to the dressing changes with Hydrofera Blue. It sounds like there are issues maintaining suction and the drape is getting bunched up creating a leak. The wound measured larger today, but the intake nurse thinks that perhaps her measurements were taken slightly differently than last week's. There is just some thin slough on the wound surface as well as some hypertrophic granulation tissue on the lateral border. 03/13/2022: The snap VAC worked much better this week and only began leaking yesterday. At that point, the patient's family removed it and applied Hydrofera Blue to her wound. Today, the tissue coverage over the bone that had been exposed is more substantial. She still has undermining present. There is also some senescent skin around the wound orifice along with some hypertrophic granulation tissue. 03/20/2022: The snap VAC unfortunately failed on Friday. There has really been no significant change to the wound for several weeks. The degree of undermining is unchanged. The surface is clean. 03/27/2022: Once again, the snap VAC failed prematurely and had to be removed on Saturday. The undermining remains the same. There is more tissue coverage of the previously exposed bone. The wound surface has just a light layer of biofilm and thin slough. She was approved for a standard wound VAC, but there are currently no home health agencies able to staff dressing changes for her; it is too difficult for her and her family to bring her to the wound center more than once a week. 04/03/2022: The snap VAC stayed on but the canister filled up on Sunday. The direct depth of the wound is a little bit shallower but the undermining has not really changed. There is some slightly discolored, darker tissue at the 9 o'clock position. No odor or purulent drainage. 04/11/2022: Using 2 canisters worked well and the snap VAC lasted until yesterday. She has her standard wound VAC with her today. The  orifice of the wound has contracted somewhat. There is still substantial undermining at 12:00, but the undermined portion to the right between 2 and 4:00 has closed and somewhat. The previously exposed bone is no longer even palpable under the overlying tissue. No concern for infection. 04/17/2022: A standard VAC dressing was initiated last week. The family has been changing it at home, due to the inability to secure home health services. It is a little bit challenging to apply, due to the small orifice of the wound with substantial undermining. Today, the undermining at 12:00 seems like it might be a little bit deeper, but the 2-4 o'clock undermining is decreased. There is excellent tissue coverage over the previously exposed bone. There is a little bit of slough accumulation at the orifice, along with some senescent skin. 04/23/2022: The wound VAC was not changed all week due to the family being overwhelmed with other issues. Fortunately, there does not seem to have been any untoward effects from this. The orifice of the wound remains unchanged but the undermining and tunneling has narrowed to just 1 area at 12:00. No purulent drainage or odor. Good tissue coverage of the previously exposed bone. 04/30/2022: The tunneling has narrowed substantially. There is no purulent drainage or odor  from the wound. The orifice of the wound is about the same size and there is some hypertrophic granulation tissue at the opening. 05/07/2022: The undermining has come in substantially. The only particularly deep area is from 1-2 o'clock. The rest of the wound is clean without any purulent drainage or odor. 05/14/2022: The orifice of the wound has contracted further. She continues to have just under 5 cm of tunneling at 12:00, but the wound is clean without any [name], [name] ([id number]) [id number] Page 6 of 9 purulent drainage or odor. 05/21/2022: The tunnel has come in by about half a  centimeter. The wound is clean without any slough. 05/28/2022: The tunnel continues to contract. The wound is clean with good granulation tissue at the base. No concern for infection. 06/04/2022: The tunnel is about the same length this week, but it is quite a bit narrower. The wound is clean with good granulation tissue. Unfortunately, her family applied the track pad directly over the sponge without any intervening drape and without bridging it away from her spine so she has a small ulcer from the track pad just caudal to the wound. 06/12/2022: The small ulcer from the track pad that was present last week has healed. Otherwise the wound is basically unchanged. 06/18/2022: No real change to the tunneling. The surface of the wound is clean. 06/25/2022: We discontinued the wound VAC last week and has been packing with iodoform packing strips. The tunneling has come in by about half a centimeter. The wound is clean. 07/02/2022: There has really been no change to the wound over the past week. There is a little hypertrophic granulation tissue at the orifice but otherwise the tunneling is about the same and the wound is clean. 07/16/2022: The tunneling has come in by a couple of millimeters. The wound is clean otherwise. 07/30/2022: The tunnel has come in by half a centimeter. The wound is clean. Patient History Family History Unknown History. Social History Never smoker, Marital Status - Married, Alcohol Use - Never, Drug Use - No History, Caffeine Use - Rarely. Medical History Ear/Nose/Mouth/Throat Denies history of Chronic sinus problems/congestion, Middle ear problems Cardiovascular Patient has history of Hypertension Endocrine Patient has history of Type II Diabetes Immunological Denies history of Lupus Erythematosus, Raynaudoos, Scleroderma Oncologic Denies history of Received Chemotherapy, Received Radiation Hospitalization/Surgery History - t/11 t/12 FRACTURE/SURGERY. - FEMUR FX SURGERY. -  TOTAL HYSTERECTOMY. - CHOLECYSTECTOMY. - TONSILLECTOMY. Medical A Surgical History Notes nd Ear/Nose/Mouth/Throat PARTIAL HEARING LEFT HEAR W/ HEARING AID TOTAL DEAFNESS RIGHT EAR Gastrointestinal Melena Endocrine Hypothyroidism Hyperlipidemia associated with type 2 diabetes mellitus (HCC) Musculoskeletal Closed T12 fracture (HCC) Hip fracture (HCC) Closed comminuted intertrochanteric fracture of proximal end of right femur (HCC) Objective Constitutional Slightly tachycardic, asymptomatic. No acute distress.. Vitals Time Taken: 1:25 PM, Height: 62 in, Weight: 110 lbs, BMI: 20.1, Temperature: 97.6 F, Pulse: 107 bpm, Respiratory Rate: 18 breaths/min, Blood Pressure: 117/70 mmHg. Respiratory Normal work of breathing on room air.. General Notes: 07/30/2022: The tunnel has come in by half a centimeter. The wound is clean. Integumentary (Hair, Skin) Wound #1 status is Open. Original cause of wound was Gradually Appeared. The date acquired was: 12/19/2020. The wound has been in treatment 31 weeks. The wound is located on the Thoracic spine. The wound measures 1cm length x 0.8cm width x 0.5cm depth; 0.628cm^2 area and 0.314cm^3 volume. There is Fat Layer (Subcutaneous Tissue) exposed. There is no tunneling noted, however, there is undermining starting at 12:00 and ending at 3:00 with a  maximum distance of 3.7cm. There is a medium amount of serous drainage noted. The wound margin is well defined and not attached to the wound base. There is large (67-100%) red, pink granulation within the wound bed. There is no necrotic tissue within the wound bed. The periwound skin appearance had no abnormalities noted for texture. The periwound skin appearance had no abnormalities noted for moisture. The periwound skin appearance had no abnormalities noted for color. Periwound temperature was noted as No Abnormality. DNIYAH, [name] ([id number]) [id number] Page 7 of  9 Assessment Active Problems ICD-10 Pressure ulcer of unspecified part of back, stage 4 Type 2 diabetes mellitus with other skin ulcer Unspecified fracture of T11-T12 vertebra, sequela Plan Follow-up Appointments: Return Appointment in 2 weeks. - Dr Celine Ahr - Room 1 - with Vaughan Basta Anesthetic: Wound #1 Thoracic spine: (In clinic) Topical Lidocaine 4% applied to wound bed Bathing/ Shower/ Hygiene: May shower with protection but do not get wound dressing(s) wet. Off-Loading: Turn and reposition every 2 hours Other: - KEEP PRESSURE OFF OF BACK MAY USE EGG CRATE CUSHION Additional Orders / Instructions: Follow Nutritious Diet WOUND #1: - Thoracic spine Wound Laterality: Cleanser: Wound Cleanser (Generic) 1 x Per Day/30 Days Discharge Instructions: Cleanse the wound with wound cleanser prior to applying a clean dressing using gauze sponges, not tissue or cotton balls. Cleanser: [place] Ancillary Kit - 15 Day Supply (DME) (Generic) 1 x Per Day/30 Days Discharge Instructions: Use supplies as instructed; Kit contains: (15) Saline Bullets; (15) 3x3 Gauze; 15 pr Gloves Cleanser: cotton tipped applicators 6" (DME) (Generic) 1 x Per Day/30 Days Peri-Wound Care: Skin Prep 1 x Per Day/30 Days Discharge Instructions: Use skin prep as directed Prim Dressing: Promogran Prisma Matrix, 4.34 (sq in) (silver collagen) (DME) (Dispense As Written) 1 x Per Day/30 Days ary Discharge Instructions: Moisten collagen with saline or hydrogel Prim Dressing: Iodoform packing strip 1/2 (in) (DME) (Generic) 1 x Per Day/30 Days ary Discharge Instructions: Lightly pack into wound especially into underminig from 12-3 o'clock Secondary Dressing: Zetuvit Plus Silicone Border Dressing 4x4 (in/in) (DME) (Generic) 1 x Per Day/30 Days Discharge Instructions: Apply silicone border over primary dressing as directed. 07/30/2022: The tunnel has come in by half a centimeter. The wound is clean. No debridement was necessary today. We  will continue to pack the wound with iodoform packing strips, but we are going to put Prisma silver collagen down on the wound surface first. Follow-up in 2 weeks. Electronic Signature(s) Signed: 07/30/2022 1:57:31 PM By: Fredirick Maudlin MD FACS Entered By: Fredirick Maudlin on 07/30/2022 13:57:31 -------------------------------------------------------------------------------- HxROS Details Patient Name: Date of Service: Stacy [name], [name] 07/30/2022 1:15 PM Medical Record Number: [id number] Patient Account Number: [id number] Date of Birth/Sex: Treating RN: [birth year] (88 y.o. [name] Primary Care Provider: [name] Other Clinician: Referring Provider: Treating Provider/Extender: Nicholaus Corolla in Treatment: 31 Ear/Nose/Mouth/Throat Medical History: Negative for: Chronic sinus problems/congestion; Middle ear problems Past Medical History Notes: PARTIAL HEARING LEFT [facility] W/ HEARING [place] TOTAL DEAFNESS RIGHT EAR [name], [name] ([id number]) [id number] Page 8 of 9 Cardiovascular Medical History: Positive for: Hypertension Gastrointestinal Medical History: Past Medical History Notes: Melena Endocrine Medical History: Positive for: Type II Diabetes Past Medical History Notes: Hypothyroidism Hyperlipidemia associated with type 2 diabetes mellitus ([place]) Treated with: Diet Immunological Medical History: Negative for: Lupus Erythematosus; Raynauds; Scleroderma Musculoskeletal Medical History: Past Medical History Notes: Closed T12 fracture ([place]) Hip fracture ([place]) Closed comminuted intertrochanteric fracture of proximal end of right femur [facility]) Oncologic Medical  History: Negative for: Received Chemotherapy; Received Radiation Immunizations Pneumococcal Vaccine: Received Pneumococcal Vaccination: Yes Received Pneumococcal Vaccination On or After 60th Birthday: No Implantable Devices None Hospitalization /  Surgery History Type of Hospitalization/Surgery t/11 t/12 FRACTURE/SURGERY FEMUR FX SURGERY TOTAL HYSTERECTOMY CHOLECYSTECTOMY TONSILLECTOMY Family and Social History Unknown History: Yes; Never smoker; Marital Status - Married; Alcohol Use: Never; Drug Use: No History; Caffeine Use: Rarely; Financial Concerns: No; Food, Clothing or Shelter Needs: No; Support System Lacking: No; Transportation Concerns: No Electronic Signature(s) Signed: 07/30/2022 4:56:05 PM By: Fredirick Maudlin MD FACS Signed: 07/30/2022 5:16:26 PM By: Baruch Gouty RN, BSN Entered By: Fredirick Maudlin on 07/30/2022 13:56:23 -------------------------------------------------------------------------------- SuperBill Details Patient Name: Date of Service: [address] 07/30/2022 ERLEAN, MEALOR Perry ([id number]) [id number] Page 9 of 9 Medical Record Number: [id number] Patient Account Number: [id number] Date of Birth/Sex: Treating RN: [birth year] (88 y.o. [name] Primary Care Provider: [name] Other Clinician: Referring Provider: Treating Provider/Extender: Nicholaus Corolla in Treatment: 31 Diagnosis Coding ICD-10 Codes Code Description L89.104 Pressure ulcer of unspecified part of back, stage 4 E11.622 Type 2 diabetes mellitus with other skin ulcer S22.089S Unspecified fracture of T11-T12 vertebra, sequela Facility Procedures : CPT4 Code: [id number] Description: 99213 - WOUND CARE VISIT-LEV 3 EST PT Modifier: Quantity: 1 Physician Procedures : CPT4 Code Description Modifier [id number] 99214 - WC PHYS LEVEL 4 - EST PT ICD-10 Diagnosis Description L89.104 Pressure ulcer of unspecified part of back, stage 4 S22.089S Unspecified fracture of T11-T12 vertebra, sequela E11.622 Type 2 diabetes mellitus  with other skin ulcer Quantity: 1 Electronic Signature(s) Signed: 07/30/2022 1:57:45 PM By: Fredirick Maudlin MD FACS Entered By: Fredirick Maudlin on  07/30/2022 13:57:45

## 2022-07-30 NOTE — Progress Notes (Signed)
Stacy, Perry ([id number]) [id number] Page 1 of 8 Visit Report for 07/30/2022 Arrival Information Details Patient Name: Date of Service: [name], [name] 07/30/2022 1:15 PM Medical Record Number: [id number] Patient Account Number: [id number] Date of Birth/Sex: Treating RN: November 14, 1922 (88 y.o. [name] Primary Care Josealfredo Adkins: [name] Other Clinician: Referring Yuya Vanwingerden: Treating Charod Slawinski/Extender: Nicholaus Corolla in Treatment: 44 Visit Information History Since Last Visit Added or deleted any medications: Yes Patient Arrived: Wheel Chair Any new allergies or adverse reactions: No Arrival Time: 13:24 Had a fall or experienced change in No Accompanied By: daughter activities of daily living that may affect Transfer Assistance: Manual risk of falls: Patient Identification Verified: Yes Signs or symptoms of abuse/neglect since last visito No Secondary Verification Process Completed: Yes Hospitalized since last visit: No Patient Requires Transmission-Based Precautions: No Implantable device outside of the clinic excluding No Patient Has Alerts: No cellular tissue based products placed in the center since last visit: Has Dressing in Place as Prescribed: Yes Pain Present Now: Yes Electronic Signature(s) Signed: 07/30/2022 5:16:26 PM By: Baruch Gouty RN, BSN Entered By: Baruch Gouty on 07/30/2022 13:25:18 -------------------------------------------------------------------------------- Clinic Level of Care Assessment Details Patient Name: Date of Service: [name], [name] 07/30/2022 1:15 PM Medical Record Number: [id number] Patient Account Number: [id number] Date of Birth/Sex: Treating RN: 1923/02/13 (88 y.o. [name] Primary Care [name]: [name] Other Clinician: Referring [name]: Treating [name]/Extender: Nicholaus Corolla in Treatment: 31 Clinic Level of Care  Assessment Items TOOL 4 Quantity Score []  - 0 Use when only an EandM is performed on FOLLOW-UP visit ASSESSMENTS - Nursing Assessment / Reassessment X- 1 10 Reassessment of Co-morbidities (includes updates in patient status) X- 1 5 Reassessment of Adherence to Treatment Plan ASSESSMENTS - Wound and Skin A ssessment / Reassessment X - Simple Wound Assessment / Reassessment - one wound 1 5 []  - 0 Complex Wound Assessment / Reassessment - multiple wounds []  - 0 Dermatologic / Skin Assessment (not related to wound area) ASSESSMENTS - Focused Assessment []  - 0 Circumferential Edema Measurements - multi extremities []  - 0 Nutritional Assessment / Counseling / Intervention Stacy, Perry ([id number]) [id number] Page 2 of 8 []  - 0 Lower Extremity Assessment (monofilament, tuning fork, pulses) []  - 0 Peripheral Arterial Disease Assessment (using hand held doppler) ASSESSMENTS - Ostomy and/or Continence Assessment and Care []  - 0 Incontinence Assessment and Management []  - 0 Ostomy Care Assessment and Management (repouching, etc.) PROCESS - Coordination of Care X - Simple Patient / Family Education for ongoing care 1 15 []  - 0 Complex (extensive) Patient / Family Education for ongoing care X- 1 10 Staff obtains Programmer, systems, Records, T Results / Process Orders est []  - 0 Staff telephones HHA, Nursing Homes / Clarify orders / etc []  - 0 Routine Transfer to another Facility (non-emergent condition) []  - 0 Routine Hospital Admission (non-emergent condition) []  - 0 New Admissions / Biomedical engineer / Ordering NPWT Apligraf, etc. , []  - 0 Emergency Hospital Admission (emergent condition) X- 1 10 Simple Discharge Coordination []  - 0 Complex (extensive) Discharge Coordination PROCESS - Special Needs []  - 0 Pediatric / Minor Patient Management []  - 0 Isolation Patient Management []  - 0 Hearing / Language / Visual special needs []  - 0 Assessment  of Community assistance (transportation, D/C planning, etc.) []  - 0 Additional assistance / Altered mentation []  - 0 Support Surface(s) Assessment (bed, cushion, seat, etc.) INTERVENTIONS - Wound Cleansing / Measurement X - Simple Wound  Cleansing - one wound 1 5 []  - 0 Complex Wound Cleansing - multiple wounds X- 1 5 Wound Imaging (photographs - any number of wounds) []  - 0 Wound Tracing (instead of photographs) X- 1 5 Simple Wound Measurement - one wound []  - 0 Complex Wound Measurement - multiple wounds INTERVENTIONS - Wound Dressings X - Small Wound Dressing one or multiple wounds 1 10 []  - 0 Medium Wound Dressing one or multiple wounds []  - 0 Large Wound Dressing one or multiple wounds []  - 0 Application of Medications - topical []  - 0 Application of Medications - injection INTERVENTIONS - Miscellaneous []  - 0 External ear exam []  - 0 Specimen Collection (cultures, biopsies, blood, body fluids, etc.) []  - 0 Specimen(s) / Culture(s) sent or taken to Lab for analysis []  - 0 Patient Transfer (multiple staff / Civil Service fast streamer / Similar devices) []  - 0 Simple Staple / Suture removal (25 or less) []  - 0 Complex Staple / Suture removal (26 or more) []  - 0 Hypo / Hyperglycemic Management (close monitor of Blood Glucose) Stacy, Perry Perry ([id number]) [id number] Page 3 of 8 []  - 0 Ankle / Brachial Index (ABI) - do not check if billed separately X- 1 5 Vital Signs Has the patient been seen at the hospital within the last three years: Yes Total Score: 85 Level Of Care: New/Established - Level 3 Electronic Signature(s) Signed: 07/30/2022 5:16:26 PM By: Baruch Gouty RN, BSN Entered By: Baruch Gouty on 07/30/2022 13:42:37 -------------------------------------------------------------------------------- Lower Extremity Assessment Details Patient Name: Date of Service: [name], [name] 07/30/2022 1:15 PM Medical Record Number: [id number] Patient  Account Number: [id number] Date of Birth/Sex: Treating RN: [birth year] (88 y.o. [name] Primary Care [name]: [name] Other Clinician: Referring Ruffus Kamaka: Treating Sou Nohr/Extender: Nicholaus Corolla in Treatment: 31 Electronic Signature(s) Signed: 07/30/2022 5:16:26 PM By: Baruch Gouty RN, BSN Entered By: Baruch Gouty on 07/30/2022 13:26:41 -------------------------------------------------------------------------------- Multi Wound Chart Details Patient Name: Date of Service: [name], [name] 07/30/2022 1:15 PM Medical Record Number: [id number] Patient Account Number: [id number] Date of Birth/Sex: Treating RN: December 03, 1922 (88 y.o. [name] Primary Care Kamoni Gentles: [name] Other Clinician: Referring Chantrice Hagg: Treating Antoin Dargis/Extender: Nicholaus Corolla in Treatment: 31 Vital Signs Height(in): 62 Pulse(bpm): 107 Weight(lbs): 110 Blood Pressure(mmHg): 117/70 Body Mass Index(BMI): 20.1 Temperature(F): 97.6 Respiratory Rate(breaths/min): 18 [1:Photos:] [N/A:N/A] Thoracic spine N/A N/A Wound Location: Gradually Appeared N/A N/A Wounding Event: Pressure Ulcer N/A N/A Primary Etiology: Hypertension, Type II Diabetes N/A N/A Comorbid History: 12/19/2020 N/A N/A Date Acquired: 31 N/A N/A Weeks of Treatment: Open N/A N/A Wound Status: No N/A N/A Wound RecurrenceYESICA, KEMLER ([id number]) [id number] Page 4 of 8 1x0.8x0.5 N/A N/A Measurements L x W x D (cm) 0.628 N/A N/A A (cm) : rea 0.314 N/A N/A Volume (cm) : 81.80% N/A N/A % Reduction in A rea: 93.90% N/A N/A % Reduction in Volume: 12 Starting Position 1 (o'clock): 3 Ending Position 1 (o'clock): 3.7 Maximum Distance 1 (cm): Yes N/A N/A Undermining: Category/Stage IV N/A N/A Classification: Medium N/A N/A Exudate A mount: Serous N/A N/A Exudate Type: amber N/A N/A Exudate Color: Well  defined, not attached N/A N/A Wound Margin: Large (67-100%) N/A N/A Granulation A mount: Red, Pink N/A N/A Granulation Quality: None Present (0%) N/A N/A Necrotic A mount: Fat Layer (Subcutaneous Tissue): Yes N/A N/A Exposed Structures: Fascia: No Tendon: No Muscle: No Joint: No Bone: No Small (1-33%) N/A N/A Epithelialization: No Abnormalities Noted N/A N/A Periwound Skin  Texture: No Abnormalities Noted N/A N/A Periwound Skin Moisture: No Abnormalities Noted N/A N/A Periwound Skin Color: No Abnormality N/A N/A Temperature: Treatment Notes Electronic Signature(s) Signed: 07/30/2022 1:55:29 PM By: Fredirick Maudlin MD FACS Signed: 07/30/2022 5:16:26 PM By: Baruch Gouty RN, BSN Entered By: Fredirick Maudlin on 07/30/2022 13:55:28 -------------------------------------------------------------------------------- Multi-Disciplinary Care Plan Details Patient Name: Date of Service: Marijean Heath 07/30/2022 1:15 PM Medical Record Number: [id number] Patient Account Number: [id number] Date of Birth/Sex: Treating RN: Jan 23, 1923 (88 y.o. [name] Primary Care Sheritta Deeg: [name] Other Clinician: Referring Ileigh Mettler: Treating Loa Idler/Extender: Nicholaus Corolla in Treatment: [place] reviewed with physician Active Inactive Pressure Nursing Diagnoses: Knowledge deficit related to causes and risk factors for pressure ulcer development Knowledge deficit related to management of pressures ulcers Potential for impaired tissue integrity related to pressure, friction, moisture, and shear Goals: Patient will remain free of pressure ulcers Date Initiated: 12/25/2021 Date Inactivated: 04/11/2022 Target Resolution Date: 04/06/2022 Goal Status: Met Patient/caregiver will verbalize understanding of pressure ulcer management Date Initiated: 12/25/2021 Target Resolution Date: 08/03/2022 Goal Status: Active Interventions: Assess:  immobility, friction, shearing, incontinence upon admission and as needed Assess offloading mechanisms upon admission and as needed HODAYA, CURTO ([id number]) [id number] Page 5 of 8 Notes: Wound/Skin Impairment Nursing Diagnoses: Impaired tissue integrity Knowledge deficit related to ulceration/compromised skin integrity Goals: Patient will have a decrease in wound volume by X% from date: (specify in notes) Date Initiated: 12/20/2021 Date Inactivated: 01/08/2022 Target Resolution Date: 01/12/2022 Goal Status: Unmet Unmet Reason: pressure relief Patient/caregiver will verbalize understanding of skin care regimen Date Initiated: 12/20/2021 Target Resolution Date: 08/03/2022 Goal Status: Active Ulcer/skin breakdown will have a volume reduction of 30% by week 4 Date Initiated: 12/20/2021 Date Inactivated: 04/11/2022 Target Resolution Date: 04/06/2022 Goal Status: Unmet Unmet Reason: infection, osteo Ulcer/skin breakdown will have a volume reduction of 50% by week 8 Date Initiated: 12/20/2021 Date Inactivated: 04/11/2022 Target Resolution Date: 04/06/2022 Unmet Reason: osteo, severe Goal Status: Unmet kyphosis Interventions: Assess patient/caregiver ability to obtain necessary supplies Assess patient/caregiver ability to perform ulcer/skin care regimen upon admission and as needed Assess ulceration(s) every visit Notes: Electronic Signature(s) Signed: 07/30/2022 5:16:26 PM By: Baruch Gouty RN, BSN Entered By: Baruch Gouty on 07/30/2022 13:31:29 -------------------------------------------------------------------------------- Pain Assessment Details Patient Name: Date of Service: SINDEE, [name] 07/30/2022 1:15 PM Medical Record Number: [id number] Patient Account Number: [id number] Date of Birth/Sex: Treating RN: [birth year] (88 y.o. [name] Primary Care Somya Jauregui: [name] Other Clinician: Referring Casey Fye: Treating Laverne Hursey/Extender:  Nicholaus Corolla in Treatment: 31 Active Problems Location of Pain Severity and Description of Pain Patient Has Paino Yes Site Locations Pain Location: Pain in Ulcers With Dressing Change: No Duration of the Pain. Constant / Intermittento Intermittent Rate the pain. Current Pain Level: 0 Worst Pain Level: 3 Least Pain Level: 0 Character of Pain [name], [name] ([id number]) [id number] Page 6 of 8 Describe the Pain: Tender Pain Management and Medication Current Pain Management: Medication: Yes Is the Current Pain Management Adequate: Adequate How does your wound impact your activities of daily livingo Sleep: No Bathing: No Appetite: No Relationship With Others: No Bladder Continence: No Emotions: No Bowel Continence: No Work: No Toileting: No Drive: No Dressing: No Hobbies: No Electronic Signature(s) Signed: 07/30/2022 5:16:26 PM By: Baruch Gouty RN, BSN Entered By: Baruch Gouty on 07/30/2022 13:26:35 -------------------------------------------------------------------------------- Patient/Caregiver Education Details Patient Name: Date of Service: Marijean Heath 10/23/2023andnbsp1:15 PM Medical Record Number: [id number] Patient Account Number: [id number]  Date of Birth/Gender: Treating RN: 02/01/1923 (88 y.o. [name] Primary Care Physician: [name] Other Clinician: Referring Physician: Treating Physician/Extender: Nicholaus Corolla in Treatment: 31 Education Assessment Education Provided To: Patient Education Topics Provided Pressure: Methods: Explain/Verbal Responses: Reinforcements needed, State content correctly Wound/Skin Impairment: Methods: Explain/Verbal Responses: Reinforcements needed, State content correctly Electronic Signature(s) Signed: 07/30/2022 5:16:26 PM By: Baruch Gouty RN, BSN Entered By: Baruch Gouty on 07/30/2022  13:31:52 -------------------------------------------------------------------------------- Wound Assessment Details Patient Name: Date of Service: [name], [name] 07/30/2022 1:15 PM Medical Record Number: [id number] Patient Account Number: [id number] Date of Birth/Sex: Treating RN: 1923/01/02 (88 y.o. [name] Primary Care Riely Oetken: [name] Other Clinician: Referring Rashon Westrup: Treating Alfreddie Consalvo/Extender: Nicholaus Corolla in Treatment: [address] [place] Perry ([id number]) [id number] Page 7 of 8 Wound Status Wound Number: 1 Primary Etiology: Pressure Ulcer Wound Location: Thoracic spine Wound Status: Open Wounding Event: Gradually Appeared Comorbid History: Hypertension, Type II Diabetes Date Acquired: 12/19/2020 Weeks Of Treatment: 31 Clustered Wound: No Photos Wound Measurements Length: (cm) 1 Width: (cm) 0.8 Depth: (cm) 0.5 Area: (cm) 0.628 Volume: (cm) 0.314 % Reduction in Area: 81.8% % Reduction in Volume: 93.9% Epithelialization: Small (1-33%) Tunneling: No Undermining: Yes Starting Position (o'clock): 12 Ending Position (o'clock): 3 Maximum Distance: (cm) 3.7 Wound Description Classification: Category/Stage IV Wound Margin: Well defined, not attached Exudate Amount: Medium Exudate Type: Serous Exudate Color: amber Foul Odor After Cleansing: No Slough/Fibrino No Wound Bed Granulation Amount: Large (67-100%) Exposed Structure Granulation Quality: Red, Pink Fascia Exposed: No Necrotic Amount: None Present (0%) Fat Layer (Subcutaneous Tissue) Exposed: Yes Tendon Exposed: No Muscle Exposed: No Joint Exposed: No Bone Exposed: No Periwound Skin Texture Texture Color No Abnormalities Noted: Yes No Abnormalities Noted: Yes Moisture Temperature / Pain No Abnormalities Noted: Yes Temperature: No Abnormality Electronic Signature(s) Signed: 07/30/2022 5:16:26 PM By: Baruch Gouty RN, BSN Entered  By: Baruch Gouty on 07/30/2022 13:33:28 -------------------------------------------------------------------------------- Vitals Details Patient Name: Date of Service: Stacy Like Perry. 07/30/2022 1:15 PM Medical Record Number: [id number] Patient Account Number: [id number] Date of Birth/Sex: Treating RN: [birth year] (88 y.o. [name] Primary Care Anelly Samarin: [name] Other Clinician: [name] ([id number]) [id number] Page 8 of 8 Referring Fawnda Vitullo: Treating Rami Waddle/Extender: Nicholaus Corolla in Treatment: 31 Vital Signs Time Taken: 13:25 Temperature (F): 97.6 Height (in): 62 Pulse (bpm): 107 Weight (lbs): 110 Respiratory Rate (breaths/min): 18 Body Mass Index (BMI): 20.1 Blood Pressure (mmHg): 117/70 Reference Range: 80 - 120 mg / dl Electronic Signature(s) Signed: 07/30/2022 5:16:26 PM By: Baruch Gouty RN, BSN Entered By: Baruch Gouty on 07/30/2022 13:25:42

## 2022-08-13 ENCOUNTER — Encounter (HOSPITAL_BASED_OUTPATIENT_CLINIC_OR_DEPARTMENT_OTHER): Payer: Medicare Other | Attending: General Surgery | Admitting: General Surgery

## 2022-08-13 DIAGNOSIS — E11622 Type 2 diabetes mellitus with other skin ulcer: Secondary | ICD-10-CM | POA: Insufficient documentation

## 2022-08-13 DIAGNOSIS — S22089S Unspecified fracture of T11-T12 vertebra, sequela: Secondary | ICD-10-CM | POA: Insufficient documentation

## 2022-08-13 DIAGNOSIS — X58XXXS Exposure to other specified factors, sequela: Secondary | ICD-10-CM | POA: Insufficient documentation

## 2022-08-13 DIAGNOSIS — L89104 Pressure ulcer of unspecified part of back, stage 4: Secondary | ICD-10-CM | POA: Insufficient documentation

## 2022-08-13 NOTE — Progress Notes (Signed)
[name] ([id number]) [id number] Page 1 of 9 Visit Report for 08/13/2022 Chief Complaint Document Details Patient Name: Date of Service: [name], [name]. 08/13/2022 1:15 PM Medical Record Number: [id number] Patient Account Number: [id number] Date of Birth/Sex: Treating RN: 05/11/1923 (88 y.o. F) Primary Care Provider: Robbins, Robert Other Clinician: Referring Provider: Treating Provider/Extender: ,  Robbins, Robert Weeks in Treatment: 33 Information Obtained from: Patient Chief Complaint Patient is at the clinic for treatment of an open pressure ulcer Electronic Signature(s) Signed: 08/13/2022 2:18:38 PM By: ,  MD FACS Entered By: ,  on 08/13/2022 14:18:37 -------------------------------------------------------------------------------- HPI Details Patient Name: Date of Service: [name], [name]. 08/13/2022 1:15 PM Medical Record Number: [id number] Patient Account Number: [id number] Date of Birth/Sex: Treating RN: 11/01/1922 (88 y.o. F) Primary Care Provider: Robbins, Robert Other Clinician: Referring Provider: Treating Provider/Extender: ,  Robbins, Robert Weeks in Treatment: 33 History of Present Illness HPI Description: ADMISSION 12/19/21 This is a 89-year-old woman who is presenting to clinic today with a an open ulcer over her thoracic spine. She has severe kyphosis and a history of prior T12 fracture, as well as type 2 diabetes mellitus. The wound has been present for about a year. She is accompanied by her daughters, 1 of whom is a nurse. They have been trying to offload the site by using pillows and supports to keep returned while in bed, and an eggcrate foam with a cut out for the wound for when she is sitting up. They have been applying Santyl to the site. She does have home health and the provider took a culture which was positive for Pseudomonas. She was on ciprofloxacin for this. A  recent repeat culture was negative. Most recent hemoglobin A1c was 6.5, in February. A C-reactive protein also obtained in February was elevated at 24.1. No imaging has been performed of the site. The patient denies significant pain. 12/25/2021: Last week, I took a bone biopsy as well as culture. Fortunately, the bone biopsy was negative for osteomyelitis. The culture returned with rare corynebacterium and rare Enterococcus faecalis. We have been using Santyl and Hydrofera Blue. T oday, the wound appears cleaner and there are buds of granulation tissue forming around the perimeter. 01/01/2022: Due to the positive culture, we switched to topical gentamicin. Today, she has had a little bit of a slough accumulation. The wound continues to have buds of granulation tissue forming. Apparently, the Hydrofera Blue was placed just over the wound opening rather than down in to contact the surface. No purulent drainage or odor. 01/08/2022: Last week, there was more slough in the wound bed, so I changed back to Santyl. There is now hypertrophic granulation tissue around the wound opening. Once again, we seem to be having some difficulty getting the Hydrofera Blue tucked up into the wound; there is undermining that extends for 3 to 4 cm at the 12 o'clock position and the Hydrofera Blue is simply sitting on top of the wound without being in contact with the surface. Bone is still palpable, but tissue is beginning to close in over it. 01/22/2022: The wound looks much cleaner this week and the hypertrophic granulation tissue responded appropriately to silver nitrate application. She continues to have undermining for about 4 cm from 12:00 to 4:00. I can still feel the bone in the base at about the 2 o'clock position, but it is less prominent and tissue is closing over. We have been using Santyl and Hydrofera Blue. The daughter that accompanies the patient today indicates   that they have been very diligent in making sure the  Hydrofera Blue is tucked under and into the full base of the wound. 02/05/2022: The wound overall is improved. The undermining is about the same but there is less bone palpable. We have been using Santyl and Hydrofera Blue. Minimal slough accumulation in the 2 weeks since her last visit. [name] ([id number]) [id number] Page 2 of 9 02/19/2022: The visible wound surface is very clean without any slough accumulation. Although I can feel bone in the undermined portion of the wound, it is now covered with a layer of tissue. The undermined area is about the same size, however. We have been using Santyl with Hydrofera Blue. She has been approved for snap VAC use. 02/26/2022: Last week, we applied a snap VAC. The wound dimensions have come in by a bit in all directions. The VAC canister did fill, however, and was starting to leak so the patient's daughter removed the VAC and dressed the wound as we had been previously. There is a little bit of thin slough on the wound surface with some senescent tissue at the inferior margin. Bone is still palpable but covered with a layer of tissue. No odor. 03/06/2022: We continue to have technical difficulties with the snap VAC which resulted in the patient's family removing it and going back to the dressing changes with Hydrofera Blue. It sounds like there are issues maintaining suction and the drape is getting bunched up creating a leak. The wound measured larger today, but the intake nurse thinks that perhaps her measurements were taken slightly differently than last week's. There is just some thin slough on the wound surface as well as some hypertrophic granulation tissue on the lateral border. 03/13/2022: The snap VAC worked much better this week and only began leaking yesterday. At that point, the patient's family removed it and applied Hydrofera Blue to her wound. Today, the tissue coverage over the bone that had been exposed is more  substantial. She still has undermining present. There is also some senescent skin around the wound orifice along with some hypertrophic granulation tissue. 03/20/2022: The snap VAC unfortunately failed on Friday. There has really been no significant change to the wound for several weeks. The degree of undermining is unchanged. The surface is clean. 03/27/2022: Once again, the snap VAC failed prematurely and had to be removed on Saturday. The undermining remains the same. There is more tissue coverage of the previously exposed bone. The wound surface has just a light layer of biofilm and thin slough. She was approved for a standard wound VAC, but there are currently no home health agencies able to staff dressing changes for her; it is too difficult for her and her family to bring her to the wound center more than once a week. 04/03/2022: The snap VAC stayed on but the canister filled up on Sunday. The direct depth of the wound is a little bit shallower but the undermining has not really changed. There is some slightly discolored, darker tissue at the 9 o'clock position. No odor or purulent drainage. 04/11/2022: Using 2 canisters worked well and the snap VAC lasted until yesterday. She has her standard wound VAC with her today. The orifice of the wound has contracted somewhat. There is still substantial undermining at 12:00, but the undermined portion to the right between 2 and 4:00 has closed and somewhat. The previously exposed bone is no longer even palpable under the overlying tissue. No concern for infection. 04/17/2022: A standard   VAC dressing was initiated last week. The family has been changing it at home, due to the inability to secure home health services. It is a little bit challenging to apply, due to the small orifice of the wound with substantial undermining. Today, the undermining at 12:00 seems like it might be a little bit deeper, but the 2-4 o'clock undermining is decreased. There is  excellent tissue coverage over the previously exposed bone. There is a little bit of slough accumulation at the orifice, along with some senescent skin. 04/23/2022: The wound VAC was not changed all week due to the family being overwhelmed with other issues. Fortunately, there does not seem to have been any untoward effects from this. The orifice of the wound remains unchanged but the undermining and tunneling has narrowed to just 1 area at 12:00. No purulent drainage or odor. Good tissue coverage of the previously exposed bone. 04/30/2022: The tunneling has narrowed substantially. There is no purulent drainage or odor from the wound. The orifice of the wound is about the same size and there is some hypertrophic granulation tissue at the opening. 05/07/2022: The undermining has come in substantially. The only particularly deep area is from 1-2 o'clock. The rest of the wound is clean without any purulent drainage or odor. 05/14/2022: The orifice of the wound has contracted further. She continues to have just under 5 cm of tunneling at 12:00, but the wound is clean without any purulent drainage or odor. 05/21/2022: The tunnel has come in by about half a centimeter. The wound is clean without any slough. 05/28/2022: The tunnel continues to contract. The wound is clean with good granulation tissue at the base. No concern for infection. 06/04/2022: The tunnel is about the same length this week, but it is quite a bit narrower. The wound is clean with good granulation tissue. Unfortunately, her family applied the track pad directly over the sponge without any intervening drape and without bridging it away from her spine so she has a small ulcer from the track pad just caudal to the wound. 06/12/2022: The small ulcer from the track pad that was present last week has healed. Otherwise the wound is basically unchanged. 06/18/2022: No real change to the tunneling. The surface of the wound is clean. 06/25/2022: We  discontinued the wound VAC last week and has been packing with iodoform packing strips. The tunneling has come in by about half a centimeter. The wound is clean. 07/02/2022: There has really been no change to the wound over the past week. There is a little hypertrophic granulation tissue at the orifice but otherwise the tunneling is about the same and the wound is clean. 07/16/2022: The tunneling has come in by a couple of millimeters. The wound is clean otherwise. 07/30/2022: The tunnel has come in by half a centimeter. The wound is clean. 08/13/2022: The tunnel has come in by another half centimeter. The wound is clean without any concern for infection. Electronic Signature(s) Signed: 08/13/2022 2:19:16 PM By: ,  MD FACS Entered By: ,  on 08/13/2022 14:19:16 Physical Exam Details -------------------------------------------------------------------------------- Wakeman, Analea Perry ([id number]) [id number] Page 3 of 9 Patient Name: Date of Service: [name], [name]. 08/13/2022 1:15 PM Medical Record Number: [id number] Patient Account Number: [id number] Date of Birth/Sex: Treating RN: 07/28/1923 (88 y.o. F) Primary Care Provider: Robbins, Robert Other Clinician: Referring Provider: Treating Provider/Extender: ,  Robbins, Robert Weeks in Treatment: 33 Constitutional . . . . No acute distress.. Respiratory Normal work of breathing on room air..   Notes 08/13/2022: The tunnel has come in by another half centimeter. The wound is clean without any concern for infection. Electronic Signature(s) Signed: 08/13/2022 2:19:47 PM By: Fredirick Maudlin MD FACS Entered By: Fredirick Maudlin on 08/13/2022 14:19:46 -------------------------------------------------------------------------------- Physician Orders Details Patient Name: Date of Service: KERRI-ANNE, HAEBERLE 08/13/2022 1:15 PM Medical Record Number: [id number] Patient Account Number:  [id number] Date of Birth/Sex: Treating RN: 05-28-1923 (88 y.o. [name] Primary Care Provider: [name] Other Clinician: Referring Provider: Treating Provider/Extender: Nicholaus Corolla in Treatment: 85 Verbal / Phone Orders: No Diagnosis Coding ICD-10 Coding Code Description L89.104 Pressure ulcer of unspecified part of back, stage 4 E11.622 Type 2 diabetes mellitus with other skin ulcer S22.089S Unspecified fracture of T11-T12 vertebra, sequela Follow-up Appointments ppointment in 2 weeks. - Dr Celine Ahr - Room 1 - with Vaughan Basta Return A Anesthetic Wound #1 Thoracic spine (In clinic) Topical Lidocaine 4% applied to wound bed Bathing/ Shower/ Hygiene May shower with protection but do not get wound dressing(s) wet. Off-Loading Turn and reposition every 2 hours Other: - KEEP PRESSURE OFF OF BACK MAY USE EGG CRATE CUSHION Additional Orders / Instructions Follow Nutritious Diet Wound Treatment Wound #1 - Thoracic spine Cleanser: Wound Cleanser (Generic) 1 x Per Day/30 Days Discharge Instructions: Cleanse the wound with wound cleanser prior to applying a clean dressing using gauze sponges, not tissue or cotton balls. Cleanser: [place]ram Ancillary Kit - 15 Day Supply (Generic) 1 x Per Day/30 Days Discharge Instructions: Use supplies as instructed; Kit contains: (15) Saline Bullets; (15) 3x3 Gauze; 15 pr Gloves Cleanser: cotton tipped applicators 6" (Generic) 1 x Per Day/30 Days AILED, DEFIBAUGH ([id number]) [id number] Page 4 of 9 Peri-Wound Care: Skin Prep 1 x Per Day/30 Days Discharge Instructions: Use skin prep as directed Prim Dressing: Promogran Prisma Matrix, 4.34 (sq in) (silver collagen) (Dispense As Written) 1 x Per Day/30 Days ary Discharge Instructions: Moisten collagen with saline or hydrogel Prim Dressing: Iodoform packing strip 1/2 (in) (Generic) 1 x Per Day/30 Days ary Discharge Instructions: Lightly pack into  wound especially into underminig from 12-3 o'clock Secondary Dressing: Zetuvit Plus Silicone Border Dressing 4x4 (in/in) (Generic) 1 x Per Day/30 Days Discharge Instructions: Apply silicone border over primary dressing as directed. Electronic Signature(s) Signed: 08/13/2022 4:03:30 PM By: Fredirick Maudlin MD FACS Entered By: Fredirick Maudlin on 08/13/2022 14:20:05 -------------------------------------------------------------------------------- Problem List Details Patient Name: Date of Service: Marijean Heath 08/13/2022 1:15 PM Medical Record Number: [id number] Patient Account Number: [id number] Date of Birth/Sex: Treating RN: 02/15/1923 (88 y.o. F) Primary Care Provider: [name] Other Clinician: Referring Provider: Treating Provider/Extender: Nicholaus Corolla in Treatment: 33 Active Problems ICD-10 Encounter Code Description Active Date MDM Diagnosis L89.104 Pressure ulcer of unspecified part of back, stage 4 12/19/2021 No Yes E11.622 Type 2 diabetes mellitus with other skin ulcer 12/19/2021 No Yes S22.089S Unspecified fracture of T11-T12 vertebra, sequela 12/19/2021 No Yes Inactive Problems Resolved Problems Electronic Signature(s) Signed: 08/13/2022 2:18:20 PM By: Fredirick Maudlin MD FACS Entered By: Fredirick Maudlin on 08/13/2022 14:18:20 -------------------------------------------------------------------------------- Progress Note Details Patient Name: Date of Service: Stacy Like Perry. 08/13/2022 1:15 PM Medical Record Number: [id number] Patient Account Number: [id number] [name], [name] ([id number]) [id number] Page 5 of 9 Date of Birth/Sex: Treating RN: 06/23/1923 (88 y.o. F) Primary Care Provider: Other Clinician: [name] Referring Provider: Treating Provider/Extender: Nicholaus Corolla in Treatment: 33 Subjective Chief Complaint Information obtained from Patient Patient is at the  clinic for treatment of an  open pressure ulcer History of Present Illness (HPI) ADMISSION 12/19/21 This is a 89-year-old woman who is presenting to clinic today with a an open ulcer over her thoracic spine. She has severe kyphosis and a history of prior T12 fracture, as well as type 2 diabetes mellitus. The wound has been present for about a year. She is accompanied by her daughters, 1 of whom is a nurse. They have been trying to offload the site by using pillows and supports to keep returned while in bed, and an eggcrate foam with a cut out for the wound for when she is sitting up. They have been applying Santyl to the site. She does have home health and the provider took a culture which was positive for Pseudomonas. She was on ciprofloxacin for this. A recent repeat culture was negative. Most recent hemoglobin A1c was 6.5, in February. A C-reactive protein also obtained in February was elevated at 24.1. No imaging has been performed of the site. The patient denies significant pain. 12/25/2021: Last week, I took a bone biopsy as well as culture. Fortunately, the bone biopsy was negative for osteomyelitis. The culture returned with rare corynebacterium and rare Enterococcus faecalis. We have been using Santyl and Hydrofera Blue. T oday, the wound appears cleaner and there are buds of granulation tissue forming around the perimeter. 01/01/2022: Due to the positive culture, we switched to topical gentamicin. Today, she has had a little bit of a slough accumulation. The wound continues to have buds of granulation tissue forming. Apparently, the Hydrofera Blue was placed just over the wound opening rather than down in to contact the surface. No purulent drainage or odor. 01/08/2022: Last week, there was more slough in the wound bed, so I changed back to Santyl. There is now hypertrophic granulation tissue around the wound opening. Once again, we seem to be having some difficulty getting the Hydrofera Blue  tucked up into the wound; there is undermining that extends for 3 to 4 cm at the 12 o'clock position and the Hydrofera Blue is simply sitting on top of the wound without being in contact with the surface. Bone is still palpable, but tissue is beginning to close in over it. 01/22/2022: The wound looks much cleaner this week and the hypertrophic granulation tissue responded appropriately to silver nitrate application. She continues to have undermining for about 4 cm from 12:00 to 4:00. I can still feel the bone in the base at about the 2 o'clock position, but it is less prominent and tissue is closing over. We have been using Santyl and Hydrofera Blue. The daughter that accompanies the patient today indicates that they have been very diligent in making sure the Hydrofera Blue is tucked under and into the full base of the wound. 02/05/2022: The wound overall is improved. The undermining is about the same but there is less bone palpable. We have been using Santyl and Hydrofera Blue. Minimal slough accumulation in the 2 weeks since her last visit. 02/19/2022: The visible wound surface is very clean without any slough accumulation. Although I can feel bone in the undermined portion of the wound, it is now covered with a layer of tissue. The undermined area is about the same size, however. We have been using Santyl with Hydrofera Blue. She has been approved for snap VAC use. 02/26/2022: Last week, we applied a snap VAC. The wound dimensions have come in by a bit in all directions. The VAC canister did fill, however, and was starting to leak so   the patient's daughter removed the VAC and dressed the wound as we had been previously. There is a little bit of thin slough on the wound surface with some senescent tissue at the inferior margin. Bone is still palpable but covered with a layer of tissue. No odor. 03/06/2022: We continue to have technical difficulties with the snap VAC which resulted in the patient's family  removing it and going back to the dressing changes with Hydrofera Blue. It sounds like there are issues maintaining suction and the drape is getting bunched up creating a leak. The wound measured larger today, but the intake nurse thinks that perhaps her measurements were taken slightly differently than last week's. There is just some thin slough on the wound surface as well as some hypertrophic granulation tissue on the lateral border. 03/13/2022: The snap VAC worked much better this week and only began leaking yesterday. At that point, the patient's family removed it and applied Hydrofera Blue to her wound. Today, the tissue coverage over the bone that had been exposed is more substantial. She still has undermining present. There is also some senescent skin around the wound orifice along with some hypertrophic granulation tissue. 03/20/2022: The snap VAC unfortunately failed on Friday. There has really been no significant change to the wound for several weeks. The degree of undermining is unchanged. The surface is clean. 03/27/2022: Once again, the snap VAC failed prematurely and had to be removed on Saturday. The undermining remains the same. There is more tissue coverage of the previously exposed bone. The wound surface has just a light layer of biofilm and thin slough. She was approved for a standard wound VAC, but there are currently no home health agencies able to staff dressing changes for her; it is too difficult for her and her family to bring her to the wound center more than once a week. 04/03/2022: The snap VAC stayed on but the canister filled up on Sunday. The direct depth of the wound is a little bit shallower but the undermining has not really changed. There is some slightly discolored, darker tissue at the 9 o'clock position. No odor or purulent drainage. 04/11/2022: Using 2 canisters worked well and the snap VAC lasted until yesterday. She has her standard wound VAC with her today. The  orifice of the wound has contracted somewhat. There is still substantial undermining at 12:00, but the undermined portion to the right between 2 and 4:00 has closed and somewhat. The previously exposed bone is no longer even palpable under the overlying tissue. No concern for infection. 04/17/2022: A standard VAC dressing was initiated last week. The family has been changing it at home, due to the inability to secure home health services. It is a little bit challenging to apply, due to the small orifice of the wound with substantial undermining. Today, the undermining at 12:00 seems like it might be a little bit deeper, but the 2-4 o'clock undermining is decreased. There is excellent tissue coverage over the previously exposed bone. There is a little bit of slough accumulation at the orifice, along with some senescent skin. 04/23/2022: The wound VAC was not changed all week due to the family being overwhelmed with other issues. Fortunately, there does not seem to have been any untoward effects from this. The orifice of the wound remains unchanged but the undermining and tunneling has narrowed to just 1 area at 12:00. No purulent drainage or odor. Good tissue coverage of the previously exposed bone. 04/30/2022: The tunneling has narrowed substantially.   There is no purulent drainage or odor from the wound. The orifice of the wound is about the same size and there is some hypertrophic granulation tissue at the opening. 05/07/2022: The undermining has come in substantially. The only particularly deep area is from 1-2 o'clock. The rest of the wound is clean without any purulent drainage or odor. [name], [name] ([id number]) [id number] Page 6 of 9 05/14/2022: The orifice of the wound has contracted further. She continues to have just under 5 cm of tunneling at 12:00, but the wound is clean without any purulent drainage or odor. 05/21/2022: The tunnel has come in by about half a  centimeter. The wound is clean without any slough. 05/28/2022: The tunnel continues to contract. The wound is clean with good granulation tissue at the base. No concern for infection. 06/04/2022: The tunnel is about the same length this week, but it is quite a bit narrower. The wound is clean with good granulation tissue. Unfortunately, her family applied the track pad directly over the sponge without any intervening drape and without bridging it away from her spine so she has a small ulcer from the track pad just caudal to the wound. 06/12/2022: The small ulcer from the track pad that was present last week has healed. Otherwise the wound is basically unchanged. 06/18/2022: No real change to the tunneling. The surface of the wound is clean. 06/25/2022: We discontinued the wound VAC last week and has been packing with iodoform packing strips. The tunneling has come in by about half a centimeter. The wound is clean. 07/02/2022: There has really been no change to the wound over the past week. There is a little hypertrophic granulation tissue at the orifice but otherwise the tunneling is about the same and the wound is clean. 07/16/2022: The tunneling has come in by a couple of millimeters. The wound is clean otherwise. 07/30/2022: The tunnel has come in by half a centimeter. The wound is clean. 08/13/2022: The tunnel has come in by another half centimeter. The wound is clean without any concern for infection. Patient History Family History Unknown History. Social History Never smoker, Marital Status - Married, Alcohol Use - Never, Drug Use - No History, Caffeine Use - Rarely. Medical History Ear/Nose/Mouth/Throat Denies history of Chronic sinus problems/congestion, Middle ear problems Cardiovascular Patient has history of Hypertension Endocrine Patient has history of Type II Diabetes Immunological Denies history of Lupus Erythematosus, Raynaudoos, Scleroderma Oncologic Denies history of Received  Chemotherapy, Received Radiation Hospitalization/Surgery History - t/11 t/12 FRACTURE/SURGERY. - FEMUR FX SURGERY. - TOTAL HYSTERECTOMY. - CHOLECYSTECTOMY. - TONSILLECTOMY. Medical A Surgical History Notes nd Ear/Nose/Mouth/Throat PARTIAL HEARING LEFT HEAR W/ HEARING AID TOTAL DEAFNESS RIGHT EAR Gastrointestinal Melena Endocrine Hypothyroidism Hyperlipidemia associated with type 2 diabetes mellitus (HCC) Musculoskeletal Closed T12 fracture (HCC) Hip fracture (HCC) Closed comminuted intertrochanteric fracture of proximal end of right femur (HCC) Objective Constitutional No acute distress.. Vitals Time Taken: 1:30 PM, Height: 62 in, Weight: 110 lbs, BMI: 20.1, Temperature: 97.6 F, Pulse: 94 bpm, Respiratory Rate: 18 breaths/min, Blood Pressure: 107/69 mmHg. Respiratory Normal work of breathing on room air.. General Notes: 08/13/2022: The tunnel has come in by another half centimeter. The wound is clean without any concern for infection. Integumentary (Hair, Skin) Wound #1 status is Open. Original cause of wound was Gradually Appeared. The date acquired was: 12/19/2020. The wound has been in treatment 33 weeks. The wound is located on the Thoracic spine. The wound measures 1cm length x 0.9cm width x 0.5cm depth; 0.707cm^2 area  and 0.353cm^3 volume. There is Fat Layer (Subcutaneous Tissue) exposed. There is no tunneling noted, however, there is undermining starting at 12:00 and ending at 1:00 with a maximum distance of 3.2cm. There is a medium amount of serous drainage noted. The wound margin is well defined and not attached to the wound base. There is large (67-100%) red, pink granulation within the wound bed. There is a small (1-33%) amount of necrotic tissue within the wound bed including Adherent Slough. The JENNEFER, KOPP ([id number]) [id number] Page 7 of 9 periwound skin appearance had no abnormalities noted for texture. The periwound skin appearance had no  abnormalities noted for moisture. The periwound skin appearance had no abnormalities noted for color. Periwound temperature was noted as No Abnormality. 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 - Dr Celine Ahr - Room 1 - with Vaughan Basta Anesthetic: Wound #1 Thoracic spine: (In clinic) Topical Lidocaine 4% applied to wound bed Bathing/ Shower/ Hygiene: May shower with protection but do not get wound dressing(s) wet. Off-Loading: Turn and reposition every 2 hours Other: - KEEP PRESSURE OFF OF BACK MAY USE EGG CRATE CUSHION Additional Orders / Instructions: Follow Nutritious Diet WOUND #1: - Thoracic spine Wound Laterality: Cleanser: Wound Cleanser (Generic) 1 x Per Day/30 Days Discharge Instructions: Cleanse the wound with wound cleanser prior to applying a clean dressing using gauze sponges, not tissue or cotton balls. Cleanser: [place] Ancillary Kit - 15 Day Supply (Generic) 1 x Per Day/30 Days Discharge Instructions: Use supplies as instructed; Kit contains: (15) Saline Bullets; (15) 3x3 Gauze; 15 pr Gloves Cleanser: cotton tipped applicators 6" (Generic) 1 x Per Day/30 Days Peri-Wound Care: Skin Prep 1 x Per Day/30 Days Discharge Instructions: Use skin prep as directed Prim Dressing: Promogran Prisma Matrix, 4.34 (sq in) (silver collagen) (Dispense As Written) 1 x Per Day/30 Days ary Discharge Instructions: Moisten collagen with saline or hydrogel Prim Dressing: Iodoform packing strip 1/2 (in) (Generic) 1 x Per Day/30 Days ary Discharge Instructions: Lightly pack into wound especially into underminig from 12-3 o'clock Secondary Dressing: Zetuvit Plus Silicone Border Dressing 4x4 (in/in) (Generic) 1 x Per Day/30 Days Discharge Instructions: Apply silicone border over primary dressing as  directed. 08/13/2022: The tunnel has come in by another half centimeter. The wound is clean without any concern for infection. No debridement was necessary today. We will continue to put silver collagen at the base of the wound and then pack the remainder with iodoform packing strips. Follow-up in 2 weeks. Electronic Signature(s) Signed: 08/13/2022 4:25:02 PM By: Fredirick Maudlin MD FACS Signed: 08/13/2022 4:50:27 PM By: Dellie Catholic RN Previous Signature: 08/13/2022 2:20:37 PM Version By: Fredirick Maudlin MD FACS Entered By: Dellie Catholic on 08/13/2022 16:07:27 -------------------------------------------------------------------------------- HxROS Details Patient Name: Date of Service: Stacy Like Perry. 08/13/2022 1:15 PM Medical Record Number: [id number] Patient Account Number: [id number] Date of Birth/Sex: Treating RN: [birth year] (88 y.o. F) Primary Care Provider: [name] Other Clinician: Referring Provider: Treating Provider/Extender: Nicholaus Corolla in Treatment: [address], [place] ([id number]) [id number] Page 8 of 9 Ear/Nose/Mouth/Throat Medical History: Negative for: Chronic sinus problems/congestion; Middle ear problems Past Medical History Notes: PARTIAL HEARING LEFT HEAR W/ HEARING AID TOTAL DEAFNESS RIGHT EAR Cardiovascular Medical History: Positive for: Hypertension Gastrointestinal Medical History: Past Medical History Notes: Melena Endocrine Medical History: Positive for: Type II Diabetes Past Medical History Notes:  Hypothyroidism Hyperlipidemia associated with type 2 diabetes mellitus (HCC) Treated with: Diet Immunological Medical History: Negative for: Lupus Erythematosus; Raynauds; Scleroderma Musculoskeletal Medical History: Past Medical History Notes: Closed T12 fracture (HCC) Hip fracture (HCC) Closed comminuted intertrochanteric fracture of proximal end of right femur  (HCC) Oncologic Medical History: Negative for: Received Chemotherapy; Received Radiation Immunizations Pneumococcal Vaccine: Received Pneumococcal Vaccination: Yes Received Pneumococcal Vaccination On or After 60th Birthday: No Implantable Devices None Hospitalization / Surgery History Type of Hospitalization/Surgery t/11 t/12 FRACTURE/SURGERY FEMUR FX SURGERY TOTAL HYSTERECTOMY CHOLECYSTECTOMY TONSILLECTOMY Family and Social History Unknown History: Yes; Never smoker; Marital Status - Married; Alcohol Use: Never; Drug Use: No History; Caffeine Use: Rarely; Financial Concerns: No; Food, Clothing or Shelter Needs: No; Support System Lacking: No; Transportation Concerns: No Electronic Signature(s) Signed: 08/13/2022 4:03:30 PM By: ,  MD FACS Entered By: ,  on 08/13/2022 14:19:22 Dolin, Lilliann Perry ([id number]) [id number] Page 9 of 9 -------------------------------------------------------------------------------- SuperBill Details Patient Name: Date of Service: [name], [name]. 08/13/2022 Medical Record Number: [id number] Patient Account Number: [id number] Date of Birth/Sex: Treating RN: 06/30/1923 (88 y.o. F) Primary Care Provider: Robbins, Robert Other Clinician: Referring Provider: Treating Provider/Extender: ,  Robbins, Robert Weeks in Treatment: 33 Diagnosis Coding ICD-10 Codes Code Description L89.104 Pressure ulcer of unspecified part of back, stage 4 E11.622 Type 2 diabetes mellitus with other skin ulcer S22.089S Unspecified fracture of T11-T12 vertebra, sequela Facility Procedures : CPT4 Code: [id number] Description: 99213 - WOUND CARE VISIT-LEV 3 EST PT Modifier: Quantity: 1 Physician Procedures : CPT4 Code Description Modifier [id number] - WC PHYS LEVEL 4 - EST PT ICD-10 Diagnosis Description L89.104 Pressure ulcer of unspecified part of back, stage 4 S22.089S Unspecified fracture of T11-T12  vertebra, sequela E11.622 Type 2 diabetes mellitus  with other skin ulcer Quantity: 1 Electronic Signature(s) Signed: 08/13/2022 4:25:02 PM By: ,  MD FACS Signed: 08/13/2022 4:50:27 PM By: [name], Joanne RN Previous Signature: 08/13/2022 2:20:51 PM Version By: ,  MD FACS Entered By: [name], Joanne on 08/13/2022 16:07:07 

## 2022-08-13 NOTE — Progress Notes (Signed)
Stacy, Perry ([id number]) [id number] Page 1 of 8 Visit Report for 08/13/2022 Arrival Information Details Patient Name: Date of Service: [name], [name] 08/13/2022 1:15 PM Medical Record Number: [id number] Patient Account Number: [id number] Date of Birth/Sex: Treating RN: 11/10/1922 (88 y.o. F) Primary Care [name]: [name] Other Clinician: Referring Atasha Colebank: Treating Leslea Vowles/Extender: Nicholaus Corolla in Treatment: 33 Visit Information History Since Last Visit All ordered tests and consults were completed: No Patient Arrived: Wheel Chair Added or deleted any medications: No Arrival Time: 13:30 Any new allergies or adverse reactions: No Accompanied By: daghter Had a fall or experienced change in No Transfer Assistance: None activities of daily living that may affect Patient Identification Verified: Yes risk of falls: Secondary Verification Process Completed: Yes Signs or symptoms of abuse/neglect since last visito No Patient Requires Transmission-Based Precautions: No Hospitalized since last visit: No Patient Has Alerts: No Implantable device outside of the clinic excluding No cellular tissue based products placed in the center since last visit: Pain Present Now: No Electronic Signature(s) Signed: 08/13/2022 2:31:35 PM By: Worthy Rancher Entered By: Worthy Rancher on 08/13/2022 13:30:43 -------------------------------------------------------------------------------- Clinic Level of Care Assessment Details Patient Name: Date of Service: [name], [name] 08/13/2022 1:15 PM Medical Record Number: [id number] Patient Account Number: [id number] Date of Birth/Sex: Treating RN: 1923/04/01 (88 y.o. [name] Primary Care Zakarie Sturdivant: [name] Other Clinician: Referring [name]: Treating [name]/Extender: Nicholaus Corolla in Treatment: 33 Clinic Level of Care Assessment Items TOOL 4 Quantity  Score X- 1 0 Use when only an EandM is performed on FOLLOW-UP visit ASSESSMENTS - Nursing Assessment / Reassessment X- 1 10 Reassessment of Co-morbidities (includes updates in patient status) X- 1 5 Reassessment of Adherence to Treatment Plan ASSESSMENTS - Wound and Skin A ssessment / Reassessment X - Simple Wound Assessment / Reassessment - one wound 1 5 _0  - 0 Complex Wound Assessment / Reassessment - multiple wounds _1  - 0 Dermatologic / Skin Assessment (not related to wound area) ASSESSMENTS - Focused Assessment _2  - 0 Circumferential Edema Measurements - multi extremities _3  - 0 Nutritional Assessment / Counseling / Intervention RAVEEN, WIESELER ([id number]) [id number] Page 2 of 8 _4  - 0 Lower Extremity Assessment (monofilament, tuning fork, pulses) _5  - 0 Peripheral Arterial Disease Assessment (using hand held doppler) ASSESSMENTS - Ostomy and/or Continence Assessment and Care _6  - 0 Incontinence Assessment and Management _7  - 0 Ostomy Care Assessment and Management (repouching, etc.) PROCESS - Coordination of Care X - Simple Patient / Family Education for ongoing care 1 15 _8  - 0 Complex (extensive) Patient / Family Education for ongoing care X- 1 10 Staff obtains Programmer, systems, Records, T Results / Process Orders est X- 1 10 Staff telephones HHA, Nursing Homes / Clarify orders / etc _9  - 0 Routine Transfer to another Facility (non-emergent condition) _10  - 0 Routine Hospital Admission (non-emergent condition) _11  - 0 New Admissions / Biomedical engineer / Ordering NPWT Apligraf, etc. , _12  - 0 Emergency Hospital Admission (emergent condition) X- 1 10 Simple Discharge Coordination _13  - 0 Complex (extensive) Discharge Coordination PROCESS - Special Needs _14  - 0 Pediatric / Minor Patient Management _15  - 0 Isolation Patient Management _16  - 0 Hearing / Language / Visual special needs _17  - 0 Assessment of Community assistance  (transportation, D/C planning, etc.) _18  - 0 Additional assistance / Altered mentation _19  - 0 Support Surface(s) Assessment (bed, cushion, seat, etc.) INTERVENTIONS - Wound Cleansing / Measurement X - Simple Wound Cleansing - one  wound 1 5 _0  - 0 Complex Wound Cleansing - multiple wounds X- 1 5 Wound Imaging (photographs - any number of wounds) _1  - 0 Wound Tracing (instead of photographs) X- 1 5 Simple Wound Measurement - one wound _2  - 0 Complex Wound Measurement - multiple wounds INTERVENTIONS - Wound Dressings X - Small Wound Dressing one or multiple wounds 1 10 _3  - 0 Medium Wound Dressing one or multiple wounds _4  - 0 Large Wound Dressing one or multiple wounds <VHQIONGEXBMWUXLK>_4<\/MWNUUVOZDGUYQIHK>_7  - 0 Application of Medications - topical <QQVZDGLOVFIEPPIR>_5<\/JOACZYSAYTKZSWFU>_9  - 0 Application of Medications - injection INTERVENTIONS - Miscellaneous _7  - 0 External ear exam _8  - 0 Specimen Collection (cultures, biopsies, blood, body fluids, etc.) _9  - 0 Specimen(s) / Culture(s) sent or taken to Lab for analysis _10  - 0 Patient Transfer (multiple staff / Civil Service fast streamer / Similar devices) _11  - 0 Simple Staple / Suture removal (25 or less) _12  - 0 Complex Staple / Suture removal (26 or more) _13  - 0 Hypo / Hyperglycemic Management (close monitor of Blood Glucose) LAVONA, NORSWORTHY B ([id number]) [id number] Page 3 of 8 _14  - 0 Ankle / Brachial Index (ABI) - do not check if billed separately X- 1 5 Vital Signs Has the patient been seen at the hospital within the last three years: Yes Total Score: 95 Level Of Care: New/Established - Level 3 Electronic Signature(s) Signed: 08/13/2022 4:50:27 PM By: Dellie Catholic RN Entered By: Dellie Catholic on 08/13/2022 16:06:57 -------------------------------------------------------------------------------- Encounter Discharge Information Details Patient Name: Date of Service: Stacy Like B. 08/13/2022 1:15 PM Medical Record Number: [id number] Patient Account Number:  [id number] Date of Birth/Sex: Treating RN: 1923-09-18 (88 y.o. [name] Primary Care Kristeena Meineke: [name] Other Clinician: Referring Allix Blomquist: Treating Darrin Apodaca/Extender: Nicholaus Corolla in Treatment: 33 Encounter Discharge Information Items Discharge Condition: Stable Ambulatory Status: Wheelchair Discharge Destination: Home Transportation: Private Auto Accompanied By: daughter Schedule Follow-up Appointment: Yes Clinical Summary of Care: Patient Declined Electronic Signature(s) Signed: 08/13/2022 4:50:27 PM By: Dellie Catholic RN Entered By: Dellie Catholic on 08/13/2022 15:59:48 -------------------------------------------------------------------------------- Lower Extremity Assessment Details Patient Name: Date of Service: [name], [name] 08/13/2022 1:15 PM Medical Record Number: [id number] Patient Account Number: [id number] Date of Birth/Sex: Treating RN: 1923/07/05 (88 y.o. [name] Primary Care Avalon Coppinger: [name] Other Clinician: Referring Gentle Hoge: Treating Sharay Bellissimo/Extender: Nicholaus Corolla in Treatment: 76 Electronic Signature(s) Signed: 08/13/2022 4:50:27 PM By: Dellie Catholic RN Entered By: Dellie Catholic on 08/13/2022 13:34:23 -------------------------------------------------------------------------------- Multi Wound Chart Details Patient Name: Date of Service: Stacy Like B. 08/13/2022 1:15 PM Medical Record Number: [id number] Patient Account Number: [id number] YUVIA, PLANT ([id number]) [phone].pdf Page 4 of 8 Date of Birth/Sex: Treating RN: 11-May-1923 (88 y.o. F) Primary Care Sayde Lish: Other Clinician: Janace Perry Referring Shauna Bodkins: Treating Julissa Browning/Extender: Nicholaus Corolla in Treatment: 33 Vital Signs Height(in): 62 Pulse(bpm): 94 Weight(lbs): 110 Blood Pressure(mmHg): 107/69 Body Mass Index(BMI):  20.1 Temperature(F): Respiratory Rate(breaths/min): 18 [1:Photos:] [N/A:N/A] Thoracic spine N/A N/A Wound Location: Gradually Appeared N/A N/A Wounding Event: Pressure Ulcer N/A N/A Primary Etiology: Hypertension, Type II Diabetes N/A N/A Comorbid History: 12/19/2020 N/A N/A Date Acquired: 66 N/A N/A Weeks of Treatment: Open N/A N/A Wound Status: No N/A N/A Wound Recurrence: 1x0.9x0.5 N/A N/A Measurements L x W x D (cm) 0.707 N/A N/A A (cm) : rea 0.353 N/A N/A Volume (cm) : 79.50% N/A N/A % Reduction in A rea: 93.20% N/A N/A % Reduction in Volume: 12 Starting Position 1 (o'clock): 1 Ending  Position 1 (o'clock): 3.2 Maximum Distance 1 (cm): Yes N/A N/A Undermining: Category/Stage IV N/A N/A Classification: Medium N/A N/A Exudate A mount: Serous N/A N/A Exudate Type: amber N/A N/A Exudate Color: Well defined, not attached N/A N/A Wound Margin: Large (67-100%) N/A N/A Granulation A mount: Red, Pink N/A N/A Granulation Quality: Small (1-33%) N/A N/A Necrotic A mount: Fat Layer (Subcutaneous Tissue): Yes N/A N/A Exposed Structures: Fascia: No Tendon: No Muscle: No Joint: No Bone: No Small (1-33%) N/A N/A Epithelialization: No Abnormalities Noted N/A N/A Periwound Skin Texture: No Abnormalities Noted N/A N/A Periwound Skin Moisture: No Abnormalities Noted N/A N/A Periwound Skin Color: No Abnormality N/A N/A Temperature: Treatment Notes Electronic Signature(s) Signed: 08/13/2022 2:18:27 PM By: Fredirick Maudlin MD FACS Entered By: Fredirick Maudlin on 08/13/2022 14:18:27 -------------------------------------------------------------------------------- Multi-Disciplinary Care Plan Details Patient Name: Date of Service: [name], Sherwood 08/13/2022 1:15 PM [name] ([id number]) [id number] Page 5 of 8 Medical Record Number: [id number] Patient Account Number: [id number] Date of Birth/Sex: Treating RN: August 18, 1923 (88  y.o. [name] Primary Care Doralene Glanz: [name] Other Clinician: Referring Makeshia Seat: Treating Anoop Hemmer/Extender: Nicholaus Corolla in Treatment: 33 Multidisciplinary Care Plan reviewed with physician Active Inactive Pressure Nursing Diagnoses: Knowledge deficit related to causes and risk factors for pressure ulcer development Knowledge deficit related to management of pressures ulcers Potential for impaired tissue integrity related to pressure, friction, moisture, and shear Goals: Patient will remain free of pressure ulcers Date Initiated: 12/25/2021 Date Inactivated: 04/11/2022 Target Resolution Date: 04/06/2022 Goal Status: Met Patient/caregiver will verbalize understanding of pressure ulcer management Date Initiated: 12/25/2021 Target Resolution Date: 08/03/2022 Goal Status: Active Interventions: Assess: immobility, friction, shearing, incontinence upon admission and as needed Assess offloading mechanisms upon admission and as needed Notes: Wound/Skin Impairment Nursing Diagnoses: Impaired tissue integrity Knowledge deficit related to ulceration/compromised skin integrity Goals: Patient will have a decrease in wound volume by X% from date: (specify in notes) Date Initiated: 12/20/2021 Date Inactivated: 01/08/2022 Target Resolution Date: 01/12/2022 Goal Status: Unmet Unmet Reason: pressure relief Patient/caregiver will verbalize understanding of skin care regimen Date Initiated: 12/20/2021 Target Resolution Date: 08/03/2022 Goal Status: Active Ulcer/skin breakdown will have a volume reduction of 30% by week 4 Date Initiated: 12/20/2021 Date Inactivated: 04/11/2022 Target Resolution Date: 04/06/2022 Goal Status: Unmet Unmet Reason: infection, osteo Ulcer/skin breakdown will have a volume reduction of 50% by week 8 Date Initiated: 12/20/2021 Date Inactivated: 04/11/2022 Target Resolution Date: 04/06/2022 Unmet Reason: osteo, severe Goal Status:  Unmet kyphosis Interventions: Assess patient/caregiver ability to obtain necessary supplies Assess patient/caregiver ability to perform ulcer/skin care regimen upon admission and as needed Assess ulceration(s) every visit Notes: Electronic Signature(s) Signed: 08/13/2022 4:50:27 PM By: Dellie Catholic RN Entered By: Dellie Catholic on 08/13/2022 14:08:24 -------------------------------------------------------------------------------- Pain Assessment Details Patient Name: Date of Service: Stacy Like B. 08/13/2022 1:15 PM [name] ([id number]) [id number] Page 6 of 8 Medical Record Number: [id number] Patient Account Number: [id number] Date of Birth/Sex: Treating RN: 10-Dec-1922 (88 y.o. F) Primary Care Makyah Lavigne: [name] Other Clinician: Referring Kagan Mutchler: Treating Cortana Vanderford/Extender: Nicholaus Corolla in Treatment: 33 Active Problems Location of Pain Severity and Description of Pain Patient Has Paino No Site Locations Pain Management and Medication Current Pain Management: Electronic Signature(s) Signed: 08/13/2022 2:31:35 PM By: Worthy Rancher Entered By: Worthy Rancher on 08/13/2022 13:31:34 -------------------------------------------------------------------------------- Patient/Caregiver Education Details Patient Name: Date of Service: [name] 11/6/2023andnbsp1:15 PM Medical Record Number: [id number] Patient Account Number: [id number] Date of Birth/Gender: Treating RN: Dec 15, 1922 (88 y.o.  [name] Primary Care Physician: [name] Other Clinician: Referring Physician: Treating Physician/Extender: Nicholaus Corolla in Treatment: 71 Education Assessment Education Provided To: Patient Education Topics Provided Wound/Skin Impairment: Methods: Explain/Verbal Responses: Return demonstration correctly Electronic Signature(s) Signed: 08/13/2022 4:50:27 PM By: Dellie Catholic  RN Entered By: Dellie Catholic on 08/13/2022 15:59:07 [name] ([id number]) [id number] Page 7 of 8 -------------------------------------------------------------------------------- Wound Assessment Details Patient Name: Date of Service: [name], [name] 08/13/2022 1:15 PM Medical Record Number: [id number] Patient Account Number: [id number] Date of Birth/Sex: Treating RN: 1923-06-30 (88 y.o. [name] Primary Care Dula Havlik: [name] Other Clinician: Referring Toccara Alford: Treating Owain Eckerman/Extender: Nicholaus Corolla in Treatment: 33 Wound Status Wound Number: 1 Primary Etiology: Pressure Ulcer Wound Location: Thoracic spine Wound Status: Open Wounding Event: Gradually Appeared Comorbid History: Hypertension, Type II Diabetes Date Acquired: 12/19/2020 Weeks Of Treatment: 33 Clustered Wound: No Photos Wound Measurements Length: (cm) 1 Width: (cm) 0.9 Depth: (cm) 0.5 Area: (cm) 0.707 Volume: (cm) 0.353 % Reduction in Area: 79.5% % Reduction in Volume: 93.2% Epithelialization: Small (1-33%) Tunneling: No Undermining: Yes Starting Position (o'clock): 12 Ending Position (o'clock): 1 Maximum Distance: (cm) 3.2 Wound Description Classification: Category/Stage IV Wound Margin: Well defined, not attached Exudate Amount: Medium Exudate Type: Serous Exudate Color: amber Foul Odor After Cleansing: No Slough/Fibrino Yes Wound Bed Granulation Amount: Large (67-100%) Exposed Structure Granulation Quality: Red, Pink Fascia Exposed: No Necrotic Amount: Small (1-33%) Fat Layer (Subcutaneous Tissue) Exposed: Yes Necrotic Quality: Adherent Slough Tendon Exposed: No Muscle Exposed: No Joint Exposed: No Bone Exposed: No Periwound Skin Texture Texture Color No Abnormalities Noted: Yes No Abnormalities Noted: Yes Moisture Temperature / Pain No Abnormalities Noted: Yes Temperature: No Abnormality Treatment  Notes Wound #1 (Thoracic spine) LAINIE, DAUBERT B ([id number]) [id number].pdf Page 8 of 8 Cleanser Wound Cleanser Discharge Instruction: Cleanse the wound with wound cleanser prior to applying a clean dressing using gauze sponges, not tissue or cotton balls. [employer] Ancillary Kit - 15 Day Supply Discharge Instruction: Use supplies as instructed; Kit contains: (15) Saline Bullets; (15) 3x3 Gauze; 15 pr Gloves cotton tipped applicators 6" Peri-Wound Care Skin Prep Discharge Instruction: Use skin prep as directed Topical Primary Dressing Promogran Prisma Matrix, 4.34 (sq in) (silver collagen) Discharge Instruction: Moisten collagen with saline or hydrogel Iodoform packing strip 1/2 (in) Discharge Instruction: Lightly pack into wound especially into underminig from 12-3 o'clock Secondary Dressing Zetuvit Plus Silicone Border Dressing 4x4 (in/in) Discharge Instruction: Apply silicone border over primary dressing as directed. Secured With Compression Wrap Compression Stockings Add-Ons Electronic Signature(s) Signed: 08/13/2022 4:50:27 PM By: Dellie Catholic RN Entered By: Dellie Catholic on 08/13/2022 13:42:07 -------------------------------------------------------------------------------- Vitals Details Patient Name: Date of Service: Stacy Like B. 08/13/2022 1:15 PM Medical Record Number: [id number] Patient Account Number: [id number] Date of Birth/Sex: Treating RN: 13-Oct-1922 (88 y.o. F) Primary Care Jovante Hammitt: [name] Other Clinician: Referring Soley Harriss: Treating Francisca Harbuck/Extender: Nicholaus Corolla in Treatment: 33 Vital Signs Time Taken: 13:30 Temperature (F): 97.6 Height (in): 62 Pulse (bpm): 94 Weight (lbs): 110 Respiratory Rate (breaths/min): 18 Body Mass Index (BMI): 20.1 Blood Pressure (mmHg): 107/69 Reference Range: 80 - 120 mg / dl Electronic Signature(s) Signed: 08/13/2022 4:50:27 PM By: Dellie Catholic  RN Previous Signature: 08/13/2022 2:31:35 PM Version By: Worthy Rancher Entered By: Dellie Catholic on 08/13/2022 15:58:38

## 2022-08-28 ENCOUNTER — Encounter (HOSPITAL_BASED_OUTPATIENT_CLINIC_OR_DEPARTMENT_OTHER): Payer: Medicare Other | Admitting: General Surgery

## 2022-08-28 DIAGNOSIS — S22089S Unspecified fracture of T11-T12 vertebra, sequela: Secondary | ICD-10-CM | POA: Diagnosis not present

## 2022-08-28 NOTE — Progress Notes (Signed)
[name], [name] ([id number]) [id number] Page 1 of 10 Visit Report for 08/28/2022 Chief Complaint Document Details Patient Name: Date of Service: [name] 08/28/2022 1:15 PM Medical Record Number: [id number] Patient Account Number: [id number] Date of Birth/Sex: Treating RN: Feb 11, 1923 (88 y.o. F) Primary Care Provider: [name] Other Clinician: Referring Provider: Treating Provider/Extender: Nicholaus Corolla in Treatment: 36 Information Obtained from: Patient Chief Complaint Patient is at the clinic for treatment of an open pressure ulcer Electronic Signature(s) Signed: 08/28/2022 1:59:25 PM By: Fredirick Maudlin MD FACS Entered By: Fredirick Maudlin on 08/28/2022 13:59:25 -------------------------------------------------------------------------------- HPI Details Patient Name: Date of Service: [name]. 08/28/2022 1:15 PM Medical Record Number: [id number] Patient Account Number: [id number] Date of Birth/Sex: Treating RN: 09-Aug-1923 (88 y.o. F) Primary Care Provider: [name] Other Clinician: Referring Provider: Treating Provider/Extender: Nicholaus Corolla in Treatment: 36 History of Present Illness HPI Description: ADMISSION 12/19/21 This is a 88 year old woman who is presenting to clinic today with a an open ulcer over her thoracic spine. She has severe kyphosis and a history of prior T12 fracture, as well as type 2 diabetes mellitus. The wound has been present for about a year. She is accompanied by her daughters, 1 of whom is a Marine scientist. They have been trying to offload the site by using pillows and supports to keep returned while in bed, and an eggcrate foam with a cut out for the wound for when she is sitting up. They have been applying Santyl to the site. She does have home health and the provider took a culture which was positive for Pseudomonas. She was on ciprofloxacin for this.  A recent repeat culture was negative. Most recent hemoglobin A1c was 6.5, in February. A C-reactive protein also obtained in February was elevated at 24.1. No imaging has been performed of the site. The patient denies significant pain. 12/25/2021: Last week, I took a bone biopsy as well as culture. Fortunately, the bone biopsy was negative for osteomyelitis. The culture returned with rare corynebacterium and rare Enterococcus faecalis. We have been using Santyl and Hydrofera Blue. T oday, the wound appears cleaner and there are buds of granulation tissue forming around the perimeter. 01/01/2022: Due to the positive culture, we switched to topical gentamicin. Today, she has had a little bit of a slough accumulation. The wound continues to have buds of granulation tissue forming. Apparently, the Hydrofera Blue was placed just over the wound opening rather than down in to contact the surface. No purulent drainage or odor. 01/08/2022: Last week, there was more slough in the wound bed, so I changed back to Santyl. There is now hypertrophic granulation tissue around the wound opening. Once again, we seem to be having some difficulty getting the Hydrofera Blue tucked up into the wound; there is undermining that extends for 3 to 4 cm at the 12 o'clock position and the Hydrofera Blue is simply sitting on top of the wound without being in contact with the surface. Bone is still palpable, but tissue is beginning to close in over it. 01/22/2022: The wound looks much cleaner this week and the hypertrophic granulation tissue responded appropriately to silver nitrate application. She continues to have undermining for about 4 cm from 12:00 to 4:00. I can still feel the bone in the base at about the 2 o'clock position, but it is less prominent and tissue is closing over. We have been using Santyl and Hydrofera Blue. The daughter that accompanies the patient today indicates  that they have been very diligent in making sure  the [facility] is tucked under and into the full base of the wound. 02/05/2022: The wound overall is improved. The undermining is about the same but there is less bone palpable. We have been using Santyl and Hydrofera Blue. Minimal slough accumulation in the 2 weeks since her last visit. [name], [name] ([id number]) [id number] Page 2 of 10 02/19/2022: The visible wound surface is very clean without any slough accumulation. Although I can feel bone in the undermined portion of the wound, it is now covered with a layer of tissue. The undermined area is about the same size, however. We have been using Santyl with Hydrofera Blue. She has been approved for snap VAC use. 02/26/2022: Last week, we applied a snap VAC. The wound dimensions have come in by a bit in all directions. The VAC canister did fill, however, and was starting to leak so the patient's daughter removed the VAC and dressed the wound as we had been previously. There is a little bit of thin slough on the wound surface with some senescent tissue at the inferior margin. Bone is still palpable but covered with a layer of tissue. No odor. 03/06/2022: We continue to have technical difficulties with the snap VAC which resulted in the patient's family removing it and going back to the dressing changes with Hydrofera Blue. It sounds like there are issues maintaining suction and the drape is getting bunched up creating a leak. The wound measured larger today, but the intake nurse thinks that perhaps her measurements were taken slightly differently than last week's. There is just some thin slough on the wound surface as well as some hypertrophic granulation tissue on the lateral border. 03/13/2022: The snap VAC worked much better this week and only began leaking yesterday. At that point, the patient's family removed it and applied Hydrofera Blue to her wound. Today, the tissue coverage over the bone that had been exposed is  more substantial. She still has undermining present. There is also some senescent skin around the wound orifice along with some hypertrophic granulation tissue. 03/20/2022: The snap VAC unfortunately failed on Friday. There has really been no significant change to the wound for several weeks. The degree of undermining is unchanged. The surface is clean. 03/27/2022: Once again, the snap VAC failed prematurely and had to be removed on Saturday. The undermining remains the same. There is more tissue coverage of the previously exposed bone. The wound surface has just a light layer of biofilm and thin slough. She was approved for a standard wound VAC, but there are currently no home health agencies able to staff dressing changes for her; it is too difficult for her and her family to bring her to the wound center more than once a week. 04/03/2022: The snap VAC stayed on but the canister filled up on Sunday. The direct depth of the wound is a little bit shallower but the undermining has not really changed. There is some slightly discolored, darker tissue at the 9 o'clock position. No odor or purulent drainage. 04/11/2022: Using 2 canisters worked well and the snap VAC lasted until yesterday. She has her standard wound VAC with her today. The orifice of the wound has contracted somewhat. There is still substantial undermining at 12:00, but the undermined portion to the right between 2 and 4:00 has closed and somewhat. The previously exposed bone is no longer even palpable under the overlying tissue. No concern for infection. 04/17/2022: A standard  VAC dressing was initiated last week. The family has been changing it at home, due to the inability to secure home health services. It is a little bit challenging to apply, due to the small orifice of the wound with substantial undermining. Today, the undermining at 12:00 seems like it might be a little bit deeper, but the 2-4 o'clock undermining is decreased. There is  excellent tissue coverage over the previously exposed bone. There is a little bit of slough accumulation at the orifice, along with some senescent skin. 04/23/2022: The wound VAC was not changed all week due to the family being overwhelmed with other issues. Fortunately, there does not seem to have been any untoward effects from this. The orifice of the wound remains unchanged but the undermining and tunneling has narrowed to just 1 area at 12:00. No purulent drainage or odor. Good tissue coverage of the previously exposed bone. 04/30/2022: The tunneling has narrowed substantially. There is no purulent drainage or odor from the wound. The orifice of the wound is about the same size and there is some hypertrophic granulation tissue at the opening. 05/07/2022: The undermining has come in substantially. The only particularly deep area is from 1-2 o'clock. The rest of the wound is clean without any purulent drainage or odor. 05/14/2022: The orifice of the wound has contracted further. She continues to have just under 5 cm of tunneling at 12:00, but the wound is clean without any purulent drainage or odor. 05/21/2022: The tunnel has come in by about half a centimeter. The wound is clean without any slough. 05/28/2022: The tunnel continues to contract. The wound is clean with good granulation tissue at the base. No concern for infection. 06/04/2022: The tunnel is about the same length this week, but it is quite a bit narrower. The wound is clean with good granulation tissue. Unfortunately, her family applied the track pad directly over the sponge without any intervening drape and without bridging it away from her spine so she has a small ulcer from the track pad just caudal to the wound. 06/12/2022: The small ulcer from the track pad that was present last week has healed. Otherwise the wound is basically unchanged. 06/18/2022: No real change to the tunneling. The surface of the wound is clean. 06/25/2022: We  discontinued the wound VAC last week and has been packing with iodoform packing strips. The tunneling has come in by about half a centimeter. The wound is clean. 07/02/2022: There has really been no change to the wound over the past week. There is a little hypertrophic granulation tissue at the orifice but otherwise the tunneling is about the same and the wound is clean. 07/16/2022: The tunneling has come in by a couple of millimeters. The wound is clean otherwise. 07/30/2022: The tunnel has come in by half a centimeter. The wound is clean. 08/13/2022: The tunnel has come in by another half centimeter. The wound is clean without any concern for infection. 08/28/2022: The tunnel is down to 2.5 cm. There is some hypertrophic granulation tissue on the wound surface. The wound is clean without any concern for infection. Electronic Signature(s) Signed: 08/28/2022 1:59:59 PM By: Fredirick Maudlin MD FACS Entered By: Fredirick Maudlin on 08/28/2022 13:59:58 [name] ([id number]) [id number] Page 3 of 10 -------------------------------------------------------------------------------- Chemical Cauterization Details Patient Name: Date of Service: [name], [name] 08/28/2022 1:15 PM Medical Record Number: [id number] Patient Account Number: [id number] Date of Birth/Sex: Treating RN: [birth year] (88 y.o. [name] Primary Care Provider: [name] Other  Clinician: Referring Provider: Treating Provider/Extender: Nicholaus Corolla in Treatment: 36 Procedure Performed for: Wound #1 Thoracic spine Performed By: Physician Fredirick Maudlin, MD Post Procedure Diagnosis Same as Pre-procedure Electronic Signature(s) Signed: 08/28/2022 3:41:18 PM By: Fredirick Maudlin MD FACS Signed: 08/28/2022 5:19:38 PM By: Dellie Catholic RN Entered By: Dellie Catholic on 08/28/2022  13:39:06 -------------------------------------------------------------------------------- Physical Exam Details Patient Name: Date of Service: [name], [name] 08/28/2022 1:15 PM Medical Record Number: [id number] Patient Account Number: [id number] Date of Birth/Sex: Treating RN: 1922-12-10 (88 y.o. F) Primary Care Provider: [name] Other Clinician: Referring Provider: Treating Provider/Extender: Nicholaus Corolla in Treatment: 36 Constitutional . . . . No acute distress. Respiratory Normal work of breathing on room air. Notes 08/28/2022: The tunnel is down to 2.5 cm. There is some hypertrophic granulation tissue on the wound surface. The wound is clean without any concern for infection. Electronic Signature(s) Signed: 08/28/2022 2:00:26 PM By: Fredirick Maudlin MD FACS Entered By: Fredirick Maudlin on 08/28/2022 14:00:26 -------------------------------------------------------------------------------- Physician Orders Details Patient Name: Date of Service: [name], [name] 08/28/2022 1:15 PM Medical Record Number: [id number] Patient Account Number: [id number] Date of Birth/Sex: Treating RN: 1922-11-28 (88 y.o. [name] Primary Care Provider: [name] Other Clinician: Referring Provider: Treating Provider/Extender: Nicholaus Corolla in Treatment: 28 Verbal / Phone Orders: No Diagnosis Coding ICD-10 Coding DAYNE, DEKAY ([id number]) [phone].pdf Page 4 of 10 Code Description L89.104 Pressure ulcer of unspecified part of back, stage 4 E11.622 Type 2 diabetes mellitus with other skin ulcer S22.089S Unspecified fracture of T11-T12 vertebra, sequela Follow-up Appointments ppointment in 2 weeks. - Dr Celine Ahr - Room 1 - with Vaughan Basta Return A Anesthetic Wound #1 Thoracic spine (In clinic) Topical Lidocaine 4% applied to wound bed Bathing/ Shower/ Hygiene May shower with protection but do  not get wound dressing(s) wet. Off-Loading Turn and reposition every 2 hours Other: - KEEP PRESSURE OFF OF BACK MAY USE EGG CRATE CUSHION Additional Orders / Instructions Follow Nutritious Diet Wound Treatment Wound #1 - Thoracic spine Cleanser: Wound Cleanser (Generic) 1 x Per Day/30 Days Discharge Instructions: Cleanse the wound with wound cleanser prior to applying a clean dressing using gauze sponges, not tissue or cotton balls. Cleanser: [place]ram Ancillary Kit - 15 Day Supply (Generic) 1 x Per Day/30 Days Discharge Instructions: Use supplies as instructed; Kit contains: (15) Saline Bullets; (15) 3x3 Gauze; 15 pr Gloves Cleanser: cotton tipped applicators 6" (Generic) 1 x Per Day/30 Days Peri-Wound Care: Skin Prep 1 x Per Day/30 Days Discharge Instructions: Use skin prep as directed Prim Dressing: Promogran Prisma Matrix, 4.34 (sq in) (silver collagen) (Dispense As Written) 1 x Per Day/30 Days ary Discharge Instructions: Moisten collagen with saline or hydrogel Prim Dressing: Iodoform packing strip 1/2 (in) (Generic) 1 x Per Day/30 Days ary Discharge Instructions: Lightly pack into wound especially into underminig from 12-3 o'clock Secondary Dressing: Zetuvit Plus Silicone Border Dressing 4x4 (in/in) (Generic) 1 x Per Day/30 Days Discharge Instructions: Apply silicone border over primary dressing as directed. Electronic Signature(s) Signed: 08/28/2022 3:41:18 PM By: Fredirick Maudlin MD FACS Entered By: Fredirick Maudlin on 08/28/2022 14:01:19 -------------------------------------------------------------------------------- Problem List Details Patient Name: Date of Service: [name], [name] 08/28/2022 1:15 PM Medical Record Number: [id number] Patient Account Number: [id number] Date of Birth/Sex: Treating RN: 02-07-1923 (88 y.o. Elam Dutch Primary Care Provider: [name] Other Clinician: Referring Provider: Treating Provider/Extender: Nicholaus Corolla in Treatment: 36 Active Problems ICD-10 Encounter Code Description Active Date  MDM Diagnosis L89.104 Pressure ulcer of unspecified part of back, stage 4 12/19/2021 No Yes [name], [name] ([id number]) [phone].pdf Page 5 of 10 E11.622 Type 2 diabetes mellitus with other skin ulcer 12/19/2021 No Yes S22.089S Unspecified fracture of T11-T12 vertebra, sequela 12/19/2021 No Yes Inactive Problems Resolved Problems Electronic Signature(s) Signed: 08/28/2022 1:59:02 PM By: Fredirick Maudlin MD FACS Entered By: Fredirick Maudlin on 08/28/2022 13:59:02 -------------------------------------------------------------------------------- Progress Note Details Patient Name: Date of Service: [name]. 08/28/2022 1:15 PM Medical Record Number: [id number] Patient Account Number: [id number] Date of Birth/Sex: Treating RN: November 14, 1922 (88 y.o. F) Primary Care Provider: [name] Other Clinician: Referring Provider: Treating Provider/Extender: Nicholaus Corolla in Treatment: 36 Subjective Chief Complaint Information obtained from Patient Patient is at the clinic for treatment of an open pressure ulcer History of Present Illness (HPI) ADMISSION 12/19/21 This is a 88 year old woman who is presenting to clinic today with a an open ulcer over her thoracic spine. She has severe kyphosis and a history of prior T12 fracture, as well as type 2 diabetes mellitus. The wound has been present for about a year. She is accompanied by her daughters, 1 of whom is a Marine scientist. They have been trying to offload the site by using pillows and supports to keep returned while in bed, and an eggcrate foam with a cut out for the wound for when she is sitting up. They have been applying Santyl to the site. She does have home health and the provider took a culture which was positive for Pseudomonas. She was on ciprofloxacin for this. A recent repeat culture was  negative. Most recent hemoglobin A1c was 6.5, in February. A C-reactive protein also obtained in February was elevated at 24.1. No imaging has been performed of the site. The patient denies significant pain. 12/25/2021: Last week, I took a bone biopsy as well as culture. Fortunately, the bone biopsy was negative for osteomyelitis. The culture returned with rare corynebacterium and rare Enterococcus faecalis. We have been using Santyl and Hydrofera Blue. T oday, the wound appears cleaner and there are buds of granulation tissue forming around the perimeter. 01/01/2022: Due to the positive culture, we switched to topical gentamicin. Today, she has had a little bit of a slough accumulation. The wound continues to have buds of granulation tissue forming. Apparently, the Hydrofera Blue was placed just over the wound opening rather than down in to contact the surface. No purulent drainage or odor. 01/08/2022: Last week, there was more slough in the wound bed, so I changed back to Santyl. There is now hypertrophic granulation tissue around the wound opening. Once again, we seem to be having some difficulty getting the Hydrofera Blue tucked up into the wound; there is undermining that extends for 3 to 4 cm at the 12 o'clock position and the Hydrofera Blue is simply sitting on top of the wound without being in contact with the surface. Bone is still palpable, but tissue is beginning to close in over it. 01/22/2022: The wound looks much cleaner this week and the hypertrophic granulation tissue responded appropriately to silver nitrate application. She continues to have undermining for about 4 cm from 12:00 to 4:00. I can still feel the bone in the base at about the 2 o'clock position, but it is less prominent and tissue is closing over. We have been using Santyl and Hydrofera Blue. The daughter that accompanies the patient today indicates that they have been very diligent in making sure the [facility] is tucked  under and into the full base of the wound. 02/05/2022: The wound overall is improved. The undermining is about the same but there is less bone palpable. We have been using Santyl and Hydrofera Blue. Minimal slough accumulation in the 2 weeks since her last visit. 02/19/2022: The visible wound surface is very clean without any slough accumulation. Although I can feel bone in the undermined portion of the wound, it is now covered with a layer of tissue. The undermined area is about the same size, however. We have been using Santyl with Hydrofera Blue. She has been approved for snap VAC use. 02/26/2022: Last week, we applied a snap VAC. The wound dimensions have come in by a bit in all directions. The VAC canister did fill, however, and was starting to leak so the patient's daughter removed the VAC and dressed the wound as we had been previously. There is a little bit of thin slough on the wound surface with some senescent tissue at the inferior margin. Bone is still palpable but covered with a layer of tissue. No odor. [name], [name] ([id number]) [id number] Page 6 of 10 03/06/2022: We continue to have technical difficulties with the snap VAC which resulted in the patient's family removing it and going back to the dressing changes with Hydrofera Blue. It sounds like there are issues maintaining suction and the drape is getting bunched up creating a leak. The wound measured larger today, but the intake nurse thinks that perhaps her measurements were taken slightly differently than last week's. There is just some thin slough on the wound surface as well as some hypertrophic granulation tissue on the lateral border. 03/13/2022: The snap VAC worked much better this week and only began leaking yesterday. At that point, the patient's family removed it and applied Hydrofera Blue to her wound. Today, the tissue coverage over the bone that had been exposed is more substantial. She still has  undermining present. There is also some senescent skin around the wound orifice along with some hypertrophic granulation tissue. 03/20/2022: The snap VAC unfortunately failed on Friday. There has really been no significant change to the wound for several weeks. The degree of undermining is unchanged. The surface is clean. 03/27/2022: Once again, the snap VAC failed prematurely and had to be removed on Saturday. The undermining remains the same. There is more tissue coverage of the previously exposed bone. The wound surface has just a light layer of biofilm and thin slough. She was approved for a standard wound VAC, but there are currently no home health agencies able to staff dressing changes for her; it is too difficult for her and her family to bring her to the wound center more than once a week. 04/03/2022: The snap VAC stayed on but the canister filled up on Sunday. The direct depth of the wound is a little bit shallower but the undermining has not really changed. There is some slightly discolored, darker tissue at the 9 o'clock position. No odor or purulent drainage. 04/11/2022: Using 2 canisters worked well and the snap VAC lasted until yesterday. She has her standard wound VAC with her today. The orifice of the wound has contracted somewhat. There is still substantial undermining at 12:00, but the undermined portion to the right between 2 and 4:00 has closed and somewhat. The previously exposed bone is no longer even palpable under the overlying tissue. No concern for infection. 04/17/2022: A standard VAC dressing was initiated last week. The family has been changing it at home, due  to the inability to secure home health services. It is a little bit challenging to apply, due to the small orifice of the wound with substantial undermining. Today, the undermining at 12:00 seems like it might be a little bit deeper, but the 2-4 o'clock undermining is decreased. There is excellent tissue coverage over the  previously exposed bone. There is a little bit of slough accumulation at the orifice, along with some senescent skin. 04/23/2022: The wound VAC was not changed all week due to the family being overwhelmed with other issues. Fortunately, there does not seem to have been any untoward effects from this. The orifice of the wound remains unchanged but the undermining and tunneling has narrowed to just 1 area at 12:00. No purulent drainage or odor. Good tissue coverage of the previously exposed bone. 04/30/2022: The tunneling has narrowed substantially. There is no purulent drainage or odor from the wound. The orifice of the wound is about the same size and there is some hypertrophic granulation tissue at the opening. 05/07/2022: The undermining has come in substantially. The only particularly deep area is from 1-2 o'clock. The rest of the wound is clean without any purulent drainage or odor. 05/14/2022: The orifice of the wound has contracted further. She continues to have just under 5 cm of tunneling at 12:00, but the wound is clean without any purulent drainage or odor. 05/21/2022: The tunnel has come in by about half a centimeter. The wound is clean without any slough. 05/28/2022: The tunnel continues to contract. The wound is clean with good granulation tissue at the base. No concern for infection. 06/04/2022: The tunnel is about the same length this week, but it is quite a bit narrower. The wound is clean with good granulation tissue. Unfortunately, her family applied the track pad directly over the sponge without any intervening drape and without bridging it away from her spine so she has a small ulcer from the track pad just caudal to the wound. 06/12/2022: The small ulcer from the track pad that was present last week has healed. Otherwise the wound is basically unchanged. 06/18/2022: No real change to the tunneling. The surface of the wound is clean. 06/25/2022: We discontinued the wound VAC last week and  has been packing with iodoform packing strips. The tunneling has come in by about half a centimeter. The wound is clean. 07/02/2022: There has really been no change to the wound over the past week. There is a little hypertrophic granulation tissue at the orifice but otherwise the tunneling is about the same and the wound is clean. 07/16/2022: The tunneling has come in by a couple of millimeters. The wound is clean otherwise. 07/30/2022: The tunnel has come in by half a centimeter. The wound is clean. 08/13/2022: The tunnel has come in by another half centimeter. The wound is clean without any concern for infection. 08/28/2022: The tunnel is down to 2.5 cm. There is some hypertrophic granulation tissue on the wound surface. The wound is clean without any concern for infection. Patient History Family History Unknown History. Social History Never smoker, Marital Status - Married, Alcohol Use - Never, Drug Use - No History, Caffeine Use - Rarely. Medical History Ear/Nose/Mouth/Throat Denies history of Chronic sinus problems/congestion, Middle ear problems Cardiovascular Patient has history of Hypertension Endocrine Patient has history of Type II Diabetes Immunological Denies history of Lupus Erythematosus, Raynaudoos, Scleroderma Oncologic Denies history of Received Chemotherapy, Received Radiation Hospitalization/Surgery History - t/11 t/12 FRACTURE/SURGERY. - FEMUR FX SURGERY. - TOTAL HYSTERECTOMY. -  CHOLECYSTECTOMY. - TONSILLECTOMY. [name], [name] ([id number]) [id number] Page 7 of 10 Medical A Surgical History Notes nd Ear/Nose/Mouth/Throat PARTIAL HEARING LEFT HEAR W/ HEARING AID TOTAL DEAFNESS RIGHT EAR Gastrointestinal Melena Endocrine Hypothyroidism Hyperlipidemia associated with type 2 diabetes mellitus (HCC) Musculoskeletal Closed T12 fracture (HCC) Hip fracture (HCC) Closed comminuted intertrochanteric fracture of proximal end of right femur  ([place]) Objective Constitutional No acute distress. Vitals Time Taken: 1:16 PM, Height: 62 in, Weight: 110 lbs, BMI: 20.1, Temperature: 97.5 F, Pulse: 87 bpm, Respiratory Rate: 18 breaths/min, Blood Pressure: 132/70 mmHg. Respiratory Normal work of breathing on room air. General Notes: 08/28/2022: The tunnel is down to 2.5 cm. There is some hypertrophic granulation tissue on the wound surface. The wound is clean without any concern for infection. Integumentary (Hair, Skin) Wound #1 status is Open. Original cause of wound was Gradually Appeared. The date acquired was: 12/19/2020. The wound has been in treatment 36 weeks. The wound is located on the Thoracic spine. The wound measures 0.9cm length x 0.6cm width x 0.4cm depth; 0.424cm^2 area and 0.17cm^3 volume. There is Fat Layer (Subcutaneous Tissue) exposed. There is no tunneling noted, however, there is undermining starting at 12:00 and ending at 2:00 with a maximum distance of 2.5cm. There is a medium amount of serous drainage noted. The wound margin is well defined and not attached to the wound base. There is large (67-100%) red, pink granulation within the wound bed. There is a small (1-33%) amount of necrotic tissue within the wound bed including Adherent Slough. The periwound skin appearance had no abnormalities noted for texture. The periwound skin appearance had no abnormalities noted for moisture. The periwound skin appearance had no abnormalities noted for color. Periwound temperature was noted as No Abnormality. Assessment Active Problems ICD-10 Pressure ulcer of unspecified part of back, stage 4 Type 2 diabetes mellitus with other skin ulcer Unspecified fracture of T11-T12 vertebra, sequela Procedures Wound #1 Pre-procedure diagnosis of Wound #1 is a Pressure Ulcer located on the Thoracic spine . An Chemical Cauterization procedure was performed by Fredirick Maudlin, MD. Post procedure Diagnosis Wound #1: Same as  Pre-Procedure Plan Follow-up Appointments: Return Appointment in 2 weeks. - Dr Celine Ahr - Room 1 - with Vaughan Basta Anesthetic: Wound #1 Thoracic spine: (In clinic) Topical Lidocaine 4% applied to wound bed Bathing/ Shower/ Hygiene: May shower with protection but do not get wound dressing(s) wet. Off-Loading: Turn and reposition every 2 hours Other: - KEEP PRESSURE OFF OF BACK MAY USE EGG [address] [name], [name] ([id number]) [id number] Page 8 of 10 Additional Orders / Instructions: Follow Nutritious Diet WOUND #1: - Thoracic spine Wound Laterality: Cleanser: Wound Cleanser (Generic) 1 x Per Day/30 Days Discharge Instructions: Cleanse the wound with wound cleanser prior to applying a clean dressing using gauze sponges, not tissue or cotton balls. Cleanser: Byram Ancillary Kit - 15 Day Supply (Generic) 1 x Per Day/30 Days Discharge Instructions: Use supplies as instructed; Kit contains: (15) Saline Bullets; (15) 3x3 Gauze; 15 pr Gloves Cleanser: cotton tipped applicators 6" (Generic) 1 x Per Day/30 Days Peri-Wound Care: Skin Prep 1 x Per Day/30 Days Discharge Instructions: Use skin prep as directed Prim Dressing: Promogran Prisma Matrix, 4.34 (sq in) (silver collagen) (Dispense As Written) 1 x Per Day/30 Days ary Discharge Instructions: Moisten collagen with saline or hydrogel Prim Dressing: Iodoform packing strip 1/2 (in) (Generic) 1 x Per Day/30 Days ary Discharge Instructions: Lightly pack into wound especially into underminig from 12-3 o'clock Secondary Dressing: Zetuvit Plus Silicone Border Dressing 4x4 (in/in) (  Generic) 1 x Per Day/30 Days Discharge Instructions: Apply silicone border over primary dressing as directed. 08/28/2022: The tunnel is down to 2.5 cm. There is some hypertrophic granulation tissue on the wound surface. The wound is clean without any concern for infection. No debridement was necessary today. I did chemically cauterized the  hypertrophic granulation tissue with silver nitrate. We will continue to pack the wound with Prisma silver collagen and iodoform packing strips. Follow-up in 2 weeks. Electronic Signature(s) Signed: 08/28/2022 2:01:56 PM By: Fredirick Maudlin MD FACS Entered By: Fredirick Maudlin on 08/28/2022 14:01:56 -------------------------------------------------------------------------------- HxROS Details Patient Name: Date of Service: [name]. 08/28/2022 1:15 PM Medical Record Number: [id number] Patient Account Number: [id number] Date of Birth/Sex: Treating RN: 1923-04-06 (88 y.o. F) Primary Care Provider: [name] Other Clinician: Referring Provider: Treating Provider/Extender: Nicholaus Corolla in Treatment: 36 Ear/Nose/Mouth/Throat Medical History: Negative for: Chronic sinus problems/congestion; Middle ear problems Past Medical History Notes: PARTIAL HEARING LEFT HEAR W/ HEARING AID TOTAL DEAFNESS RIGHT EAR Cardiovascular Medical History: Positive for: Hypertension Gastrointestinal Medical History: Past Medical History Notes: Melena Endocrine Medical History: Positive for: Type II Diabetes Past Medical History Notes: Hypothyroidism Hyperlipidemia associated with type 2 diabetes mellitus ([place]) Treated with: Diet Immunological Medical History: Negative for: Lupus Erythematosus; Raynauds; Scleroderma [name], [name] ([id number]) [id number] Page 9 of 10 Musculoskeletal Medical History: Past Medical History Notes: Closed T12 fracture ([place]) Hip fracture ([place]) Closed comminuted intertrochanteric fracture of proximal end of right femur [facility]) Oncologic Medical History: Negative for: Received Chemotherapy; Received Radiation Immunizations Pneumococcal Vaccine: Received Pneumococcal Vaccination: Yes Received Pneumococcal Vaccination On or After 60th Birthday: No Implantable Devices None Hospitalization / Surgery  History Type of Hospitalization/Surgery t/11 t/12 FRACTURE/SURGERY FEMUR FX SURGERY TOTAL HYSTERECTOMY CHOLECYSTECTOMY TONSILLECTOMY Family and Social History Unknown History: Yes; Never smoker; Marital Status - Married; Alcohol Use: Never; Drug Use: No History; Caffeine Use: Rarely; Financial Concerns: No; Food, Clothing or Shelter Needs: No; Support System Lacking: No; Transportation Concerns: No Electronic Signature(s) Signed: 08/28/2022 3:41:18 PM By: Fredirick Maudlin MD FACS Entered By: Fredirick Maudlin on 08/28/2022 14:00:04 -------------------------------------------------------------------------------- SuperBill Details Patient Name: Date of Service: [name] 08/28/2022 Medical Record Number: [id number] Patient Account Number: [id number] Date of Birth/Sex: Treating RN: Sep 01, 1923 (88 y.o. F) Primary Care Provider: [name] Other Clinician: Referring Provider: Treating Provider/Extender: Nicholaus Corolla in Treatment: 36 Diagnosis Coding ICD-10 Codes Code Description L89.104 Pressure ulcer of unspecified part of back, stage 4 E11.622 Type 2 diabetes mellitus with other skin ulcer S22.089S Unspecified fracture of T11-T12 vertebra, sequela Facility Procedures : CPT4 Code: [id number] Description: 41287 - CHEM CAUT GRANULATION TISS ICD-10 Diagnosis Description L89.104 Pressure ulcer of unspecified part of back, stage 4 Modifier: Quantity: 1 Physician Procedures : CPT4 Code Description Modifier YOLANDER, GOODIE ([id number]) [id number] - WC PHYS LEVEL 4 - EST PT 1 ICD-10 Diagnosis Description L89.104 Pressure ulcer of unspecified part of back, stage 4 E11.622 Type 2 diabetes  mellitus with other skin ulcer S22.089S Unspecified fracture of T11-T12 vertebra, sequela Quantity: df Page 10 of 10 : [id number] - WC PHYS CHEM CAUT GRAN TISSUE 1 ICD-10 Diagnosis Description L89.104 Pressure ulcer of  unspecified part of back, stage 4 Quantity: Electronic Signature(s) Signed: 08/28/2022 2:02:17 PM By: Fredirick Maudlin MD FACS Entered By: Fredirick Maudlin on 08/28/2022 14:02:17

## 2022-08-29 NOTE — Progress Notes (Signed)
[name] ([id number]) [id number] Page 1 of 7 Visit Report for 08/28/2022 Arrival Information Details Patient Name: Date of Service: Stacy, Perry 08/28/2022 1:15 PM Medical Record Number: [id number] Patient Account Number: [id number] Date of Birth/Sex: Treating RN: 1923-07-23 (88 y.o. Elam Dutch Primary Care Slate Debroux: [name] Other Clinician: Referring Burnett Spray: Treating Cameryn Chrisley/Extender: Nicholaus Corolla in Treatment: 12 Visit Information History Since Last Visit Added or deleted any medications: No Patient Arrived: Wheel Chair Any new allergies or adverse reactions: No Arrival Time: 13:15 Had a fall or experienced change in No Accompanied By: daughter activities of daily living that may affect Transfer Assistance: Manual risk of falls: Patient Identification Verified: Yes Signs or symptoms of abuse/neglect since last visito No Secondary Verification Process Completed: Yes Hospitalized since last visit: No Patient Requires Transmission-Based Precautions: No Implantable device outside of the clinic excluding No Patient Has Alerts: No cellular tissue based products placed in the center since last visit: Has Dressing in Place as Prescribed: Yes Pain Present Now: No Electronic Signature(s) Signed: 08/29/2022 3:43:48 PM By: Baruch Gouty RN, BSN Entered By: Baruch Gouty on 08/28/2022 13:16:10 -------------------------------------------------------------------------------- Encounter Discharge Information Details Patient Name: Date of Service: Stacy Like B. 08/28/2022 1:15 PM Medical Record Number: [id number] Patient Account Number: [id number] Date of Birth/Sex: Treating RN: August 27, 1923 (88 y.o. [name] Primary Care Furman Trentman: [name] Other Clinician: Referring Janetta Vandoren: Treating Laveda Demedeiros/Extender: Nicholaus Corolla in Treatment: 36 Encounter Discharge  Information Items Discharge Condition: Stable Ambulatory Status: Wheelchair Discharge Destination: Home Transportation: Private Auto Accompanied By: self Schedule Follow-up Appointment: Yes Clinical Summary of Care: Patient Declined Electronic Signature(s) Signed: 08/28/2022 5:19:38 PM By: Dellie Catholic RN Entered By: Dellie Catholic on 08/28/2022 17:13:45 [name] ([id number]) [id number] Page 2 of 7 -------------------------------------------------------------------------------- Lower Extremity Assessment Details Patient Name: Date of Service: [name] 08/28/2022 1:15 PM Medical Record Number: [id number] Patient Account Number: [id number] Date of Birth/Sex: Treating RN: [birth year] (88 y.o. Elam Dutch Primary Care Talitha Dicarlo: [name] Other Clinician: Referring Adra Shepler: Treating Zakaria Fromer/Extender: Nicholaus Corolla in Treatment: 36 Electronic Signature(s) Signed: 08/29/2022 3:43:48 PM By: Baruch Gouty RN, BSN Entered By: Baruch Gouty on 08/28/2022 13:16:46 -------------------------------------------------------------------------------- Multi Wound Chart Details Patient Name: Date of Service: [name] 08/28/2022 1:15 PM Medical Record Number: [id number] Patient Account Number: [id number] Date of Birth/Sex: Treating RN: 1923/04/30 (88 y.o. F) Primary Care Tristan Bramble: [name] Other Clinician: Referring Decorey Wahlert: Treating Rudine Rieger/Extender: Nicholaus Corolla in Treatment: 36 Vital Signs Height(in): 62 Pulse(bpm): 87 Weight(lbs): 110 Blood Pressure(mmHg): 132/70 Body Mass Index(BMI): 20.1 Temperature(F): 97.5 Respiratory Rate(breaths/min): 18 [1:Photos:] [N/A:N/A] Thoracic spine N/A N/A Wound Location: Gradually Appeared N/A N/A Wounding Event: Pressure Ulcer N/A N/A Primary Etiology: Hypertension, Type II Diabetes N/A N/A Comorbid  History: 12/19/2020 N/A N/A Date Acquired: 35 N/A N/A Weeks of Treatment: Open N/A N/A Wound Status: No N/A N/A Wound Recurrence: 0.9x0.6x0.4 N/A N/A Measurements L x W x D (cm) 0.424 N/A N/A A (cm) : rea 0.17 N/A N/A Volume (cm) : 87.70% N/A N/A % Reduction in A rea: 96.70% N/A N/A % Reduction in Volume: 12 Starting Position 1 (o'clock): 2 Ending Position 1 (o'clock): 2.5 Maximum Distance 1 (cm): Yes N/A N/A Undermining: Category/Stage IV N/A N/A Classification: Medium N/A N/A Exudate A mount: Serous N/A N/A Exudate Type: amber N/A N/A Exudate Color: Well defined, not attached N/A N/A Wound MarginKANAI, [name] ([id number]) [id number].pdf Page 3 of 7 Large (67-100%)  N/A N/A Granulation Amount: Red, Pink N/A N/A Granulation Quality: Small (1-33%) N/A N/A Necrotic Amount: Fat Layer (Subcutaneous Tissue): Yes N/A N/A Exposed Structures: Fascia: No Tendon: No Muscle: No Joint: No Bone: No Small (1-33%) N/A N/A Epithelialization: No Abnormalities Noted N/A N/A Periwound Skin Texture: No Abnormalities Noted N/A N/A Periwound Skin Moisture: No Abnormalities Noted N/A N/A Periwound Skin Color: No Abnormality N/A N/A Temperature: Chemical Cauterization N/A N/A Procedures Performed: Treatment Notes Electronic Signature(s) Signed: 08/28/2022 1:59:18 PM By: Fredirick Maudlin MD FACS Entered By: Fredirick Maudlin on 08/28/2022 13:59:18 -------------------------------------------------------------------------------- Multi-Disciplinary Care Plan Details Patient Name: Date of Service: Stacy Like B. 08/28/2022 1:15 PM Medical Record Number: [id number] Patient Account Number: [id number] Date of Birth/Sex: Treating RN: Apr 07, 1923 (88 y.o. [name] Primary Care Delaina Fetsch: [name] Other Clinician: Referring Lanard Arguijo: Treating Lizvette Lightsey/Extender: Nicholaus Corolla in Treatment:  36 Multidisciplinary Care Plan reviewed with physician Active Inactive Pressure Nursing Diagnoses: Knowledge deficit related to causes and risk factors for pressure ulcer development Knowledge deficit related to management of pressures ulcers Potential for impaired tissue integrity related to pressure, friction, moisture, and shear Goals: Patient will remain free of pressure ulcers Date Initiated: 12/25/2021 Date Inactivated: 04/11/2022 Target Resolution Date: 04/06/2022 Goal Status: Met Patient/caregiver will verbalize understanding of pressure ulcer management Date Initiated: 12/25/2021 Target Resolution Date: 10/07/2022 Goal Status: Active Interventions: Assess: immobility, friction, shearing, incontinence upon admission and as needed Assess offloading mechanisms upon admission and as needed Notes: Wound/Skin Impairment Nursing Diagnoses: Impaired tissue integrity Knowledge deficit related to ulceration/compromised skin integrity Goals: Patient will have a decrease in wound volume by X% from date: (specify in notes) Date Initiated: 12/20/2021 Date Inactivated: 01/08/2022 Target Resolution Date: 01/12/2022 Goal Status: Unmet Unmet Reason: pressure relief Patient/caregiver will verbalize understanding of skin care regimen Stacy, Perry ([id number]) [phone].pdf Page 4 of 7 Date Initiated: 12/20/2021 Target Resolution Date: 10/07/2022 Goal Status: Active Ulcer/skin breakdown will have a volume reduction of 30% by week 4 Date Initiated: 12/20/2021 Date Inactivated: 04/11/2022 Target Resolution Date: 04/06/2022 Goal Status: Unmet Unmet Reason: infection, osteo Ulcer/skin breakdown will have a volume reduction of 50% by week 8 Date Initiated: 12/20/2021 Date Inactivated: 04/11/2022 Target Resolution Date: 04/06/2022 Unmet Reason: osteo, severe Goal Status: Unmet kyphosis Interventions: Assess patient/caregiver ability to obtain necessary supplies Assess  patient/caregiver ability to perform ulcer/skin care regimen upon admission and as needed Assess ulceration(s) every visit Notes: Electronic Signature(s) Signed: 08/28/2022 5:19:38 PM By: Dellie Catholic RN Entered By: Dellie Catholic on 08/28/2022 17:12:22 -------------------------------------------------------------------------------- Pain Assessment Details Patient Name: Date of Service: Stacy, Perry 08/28/2022 1:15 PM Medical Record Number: [id number] Patient Account Number: [id number] Date of Birth/Sex: Treating RN: Sep 17, 1923 (88 y.o. Elam Dutch Primary Care Merek Niu: [name] Other Clinician: Referring Ulah Olmo: Treating Araiyah Cumpton/Extender: Nicholaus Corolla in Treatment: 36 Active Problems Location of Pain Severity and Description of Pain Patient Has Paino No Site Locations Rate the pain. Current Pain Level: 0 Pain Management and Medication Current Pain Management: Electronic Signature(s) Signed: 08/29/2022 3:43:48 PM By: Baruch Gouty RN, BSN Entered By: Baruch Gouty on 08/28/2022 13:16:38 [name] ([id number]) [id number] Page 5 of 7 -------------------------------------------------------------------------------- Patient/Caregiver Education Details Patient Name: Date of Service: Stacy, Perry 11/21/2023andnbsp1:15 PM Medical Record Number: [id number] Patient Account Number: [id number] Date of Birth/Gender: Treating RN: [birth year] (88 y.o. [name] Primary Care Physician: [name] Other Clinician: Referring Physician: Treating Physician/Extender: Nicholaus Corolla in Treatment: 36 Education Assessment Education Provided To: Patient  Education Topics Provided Wound/Skin Impairment: Methods: Explain/Verbal Responses: Return demonstration correctly Electronic Signature(s) Signed: 08/28/2022 5:19:38 PM By: Dellie Catholic RN Entered By: Dellie Catholic on 08/28/2022 17:12:38 -------------------------------------------------------------------------------- Wound Assessment Details Patient Name: Date of Service: Stacy, Perry 08/28/2022 1:15 PM Medical Record Number: [id number] Patient Account Number: [id number] Date of Birth/Sex: Treating RN: 1922-10-20 (88 y.o. Elam Dutch Primary Care Azaliyah Kennard: [name] Other Clinician: Referring Jamarrion Budai: Treating Deyonna Fitzsimmons/Extender: Nicholaus Corolla in Treatment: 36 Wound Status Wound Number: 1 Primary Etiology: Pressure Ulcer Wound Location: Thoracic spine Wound Status: Open Wounding Event: Gradually Appeared Comorbid History: Hypertension, Type II Diabetes Date Acquired: 12/19/2020 Weeks Of Treatment: 36 Clustered Wound: No Photos Wound Measurements Length: (cm) 0. Width: (cm) 0. Stacy, BROZ B ([id number]) Depth: (cm) Area: (cm) Volume: (cm) 9 % Reduction in Area: 87.7% 6 % Reduction in Volume: 96.7% [id number] Page 6 of 7 0.4 Epithelialization: Small (1-33%) 0.424 Tunneling: No 0.17 Undermining: Yes Starting Position (o'clock): 12 Ending Position (o'clock): 2 Maximum Distance: (cm) 2.5 Wound Description Classification: Category/Stage IV Wound Margin: Well defined, not attached Exudate Amount: Medium Exudate Type: Serous Exudate Color: amber Foul Odor After Cleansing: No Slough/Fibrino Yes Wound Bed Granulation Amount: Large (67-100%) Exposed Structure Granulation Quality: Red, Pink Fascia Exposed: No Necrotic Amount: Small (1-33%) Fat Layer (Subcutaneous Tissue) Exposed: Yes Necrotic Quality: Adherent Slough Tendon Exposed: No Muscle Exposed: No Joint Exposed: No Bone Exposed: No Periwound Skin Texture Texture Color No Abnormalities Noted: Yes No Abnormalities Noted: Yes Moisture Temperature / Pain No Abnormalities Noted: Yes Temperature: No Abnormality Treatment Notes Wound #1 (Thoracic  spine) Cleanser Wound Cleanser Discharge Instruction: Cleanse the wound with wound cleanser prior to applying a clean dressing using gauze sponges, not tissue or cotton balls. Byram Ancillary Kit - 15 Day Supply Discharge Instruction: Use supplies as instructed; Kit contains: (15) Saline Bullets; (15) 3x3 Gauze; 15 pr Gloves cotton tipped applicators 6" Peri-Wound Care Skin Prep Discharge Instruction: Use skin prep as directed Topical Primary Dressing Promogran Prisma Matrix, 4.34 (sq in) (silver collagen) Discharge Instruction: Moisten collagen with saline or hydrogel Iodoform packing strip 1/2 (in) Discharge Instruction: Lightly pack into wound especially into underminig from 12-3 o'clock Secondary Dressing Zetuvit Plus Silicone Border Dressing 4x4 (in/in) Discharge Instruction: Apply silicone border over primary dressing as directed. Secured With Compression Wrap Compression Stockings Environmental education officer) Signed: 08/29/2022 3:43:48 PM By: Baruch Gouty RN, BSN Entered By: Baruch Gouty on 08/28/2022 13:24:04 [name] ([id number]) [id number] Page 7 of 7 -------------------------------------------------------------------------------- Vitals Details Patient Name: Date of Service: Stacy CHARMIKA, Perry 08/28/2022 1:15 PM Medical Record Number: [id number] Patient Account Number: [id number] Date of Birth/Sex: Treating RN: 24-Sep-1923 (88 y.o. Elam Dutch Primary Care Kaitland Lewellyn: [name] Other Clinician: Referring Shelise Maron: Treating Kiaria Quinnell/Extender: Nicholaus Corolla in Treatment: 36 Vital Signs Time Taken: 13:16 Temperature (F): 97.5 Height (in): 62 Pulse (bpm): 87 Weight (lbs): 110 Respiratory Rate (breaths/min): 18 Body Mass Index (BMI): 20.1 Blood Pressure (mmHg): 132/70 Reference Range: 80 - 120 mg / dl Electronic Signature(s) Signed: 08/29/2022 3:43:48 PM By: Baruch Gouty RN,  BSN Entered By: Baruch Gouty on 08/28/2022 13:16:29

## 2022-09-10 ENCOUNTER — Encounter (HOSPITAL_BASED_OUTPATIENT_CLINIC_OR_DEPARTMENT_OTHER): Payer: Medicare Other | Attending: General Surgery | Admitting: General Surgery

## 2022-09-10 DIAGNOSIS — L89104 Pressure ulcer of unspecified part of back, stage 4: Secondary | ICD-10-CM | POA: Insufficient documentation

## 2022-09-10 DIAGNOSIS — X58XXXS Exposure to other specified factors, sequela: Secondary | ICD-10-CM | POA: Diagnosis not present

## 2022-09-10 DIAGNOSIS — S22089S Unspecified fracture of T11-T12 vertebra, sequela: Secondary | ICD-10-CM | POA: Insufficient documentation

## 2022-09-10 DIAGNOSIS — E11622 Type 2 diabetes mellitus with other skin ulcer: Secondary | ICD-10-CM | POA: Insufficient documentation

## 2022-09-10 DIAGNOSIS — Z8619 Personal history of other infectious and parasitic diseases: Secondary | ICD-10-CM | POA: Insufficient documentation

## 2022-09-10 NOTE — Progress Notes (Signed)
JOHAN, ANTONACCI ([id number]) [id number] Page 1 of 8 Visit Report for 09/10/2022 Arrival Information Details Patient Name: Date of Service: [name], [name] 09/10/2022 3:30 PM Medical Record Number: [id number] Patient Account Number: [id number] Date of Birth/Sex: Treating RN: 29-Aug-1923 (88 y.o. Harlow Ohms Primary Care Carlon Davidson: [name] Other Clinician: Referring Giovani Neumeister: Treating Calyn Rubi/Extender: Nicholaus Corolla in Treatment: 79 Visit Information History Since Last Visit Added or deleted any medications: No Patient Arrived: Wheel Chair Any new allergies or adverse reactions: No Arrival Time: 15:27 Had a fall or experienced change in No Accompanied By: daughter activities of daily living that may affect Transfer Assistance: None risk of falls: Patient Identification Verified: Yes Signs or symptoms of abuse/neglect since last visito No Secondary Verification Process Completed: Yes Hospitalized since last visit: No Patient Requires Transmission-Based Precautions: No Implantable device outside of the clinic excluding No Patient Has Alerts: No cellular tissue based products placed in the center since last visit: Has Dressing in Place as Prescribed: Yes Pain Present Now: No Electronic Signature(s) Signed: 09/10/2022 4:56:57 PM By: Adline Peals Entered By: Adline Peals on 09/10/2022 15:28:05 -------------------------------------------------------------------------------- Clinic Level of Care Assessment Details Patient Name: Date of Service: [name], [name] 09/10/2022 3:30 PM Medical Record Number: [id number] Patient Account Number: [id number] Date of Birth/Sex: Treating RN: [birth year] (88 y.o. Harlow Ohms Primary Care Marlyn Tondreau: [name] Other Clinician: Referring Jayliah Benett: Treating Kwabena Strutz/Extender: Nicholaus Corolla in Treatment: 37 Clinic Level of Care  Assessment Items TOOL 4 Quantity Score X- 1 0 Use when only an EandM is performed on FOLLOW-UP visit ASSESSMENTS - Nursing Assessment / Reassessment X- 1 10 Reassessment of Co-morbidities (includes updates in patient status) X- 1 5 Reassessment of Adherence to Treatment Plan ASSESSMENTS - Wound and Skin A ssessment / Reassessment X - Simple Wound Assessment / Reassessment - one wound 1 5 _0  - 0 Complex Wound Assessment / Reassessment - multiple wounds _1  - 0 Dermatologic / Skin Assessment (not related to wound area) ASSESSMENTS - Focused Assessment _2  - 0 Circumferential Edema Measurements - multi extremities _3  - 0 Nutritional Assessment / Counseling / Intervention ARNESHIA, ADE ([id number]) [id number] Page 2 of 8 _4  - 0 Lower Extremity Assessment (monofilament, tuning fork, pulses) _5  - 0 Peripheral Arterial Disease Assessment (using hand held doppler) ASSESSMENTS - Ostomy and/or Continence Assessment and Care _6  - 0 Incontinence Assessment and Management _7  - 0 Ostomy Care Assessment and Management (repouching, etc.) PROCESS - Coordination of Care X - Simple Patient / Family Education for ongoing care 1 15 _8  - 0 Complex (extensive) Patient / Family Education for ongoing care X- 1 10 Staff obtains Programmer, systems, Records, T Results / Process Orders est _9  - 0 Staff telephones HHA, Nursing Homes / Clarify orders / etc _10  - 0 Routine Transfer to another Facility (non-emergent condition) _11  - 0 Routine Hospital Admission (non-emergent condition) _12  - 0 New Admissions / Biomedical engineer / Ordering NPWT Apligraf, etc. , _13  - 0 Emergency Hospital Admission (emergent condition) X- 1 10 Simple Discharge Coordination _14  - 0 Complex (extensive) Discharge Coordination PROCESS - Special Needs _15  - 0 Pediatric / Minor Patient Management _16  - 0 Isolation Patient Management _17  - 0 Hearing / Language / Visual special needs _18  - 0 Assessment  of Community assistance (transportation, D/C planning, etc.) _19  - 0 Additional assistance / Altered mentation _20  - 0 Support Surface(s) Assessment (bed, cushion, seat, etc.) INTERVENTIONS - Wound Cleansing / Measurement X - Simple Wound Cleansing -  one wound 1 5 _0  - 0 Complex Wound Cleansing - multiple wounds X- 1 5 Wound Imaging (photographs - any number of wounds) _1  - 0 Wound Tracing (instead of photographs) X- 1 5 Simple Wound Measurement - one wound _2  - 0 Complex Wound Measurement - multiple wounds INTERVENTIONS - Wound Dressings X - Small Wound Dressing one or multiple wounds 1 10 _3  - 0 Medium Wound Dressing one or multiple wounds _4  - 0 Large Wound Dressing one or multiple wounds <BOFBPZWCHENIDPOE>_4<\/MPNTIRWERXVQMGQQ>_7  - 0 Application of Medications - topical <YPPJKDTOIZTIWPYK>_9<\/XIPJASNKNLZJQBHA>_1  - 0 Application of Medications - injection INTERVENTIONS - Miscellaneous _7  - 0 External ear exam _8  - 0 Specimen Collection (cultures, biopsies, blood, body fluids, etc.) _9  - 0 Specimen(s) / Culture(s) sent or taken to Lab for analysis _10  - 0 Patient Transfer (multiple staff / Civil Service fast streamer / Similar devices) _11  - 0 Simple Staple / Suture removal (25 or less) _12  - 0 Complex Staple / Suture removal (26 or more) _13  - 0 Hypo / Hyperglycemic Management (close monitor of Blood Glucose) [name], [name] ([id number]) [id number] Page 3 of 8 _14  - 0 Ankle / Brachial Index (ABI) - do not check if billed separately X- 1 5 Vital Signs Has the patient been seen at the hospital within the last three years: Yes Total Score: 85 Level Of Care: New/Established - Level 3 Electronic Signature(s) Signed: 09/10/2022 4:56:57 PM By: Adline Peals Entered By: Adline Peals on 09/10/2022 16:09:20 -------------------------------------------------------------------------------- Encounter Discharge Information Details Patient Name: Date of Service: Stacy Like Perry. 09/10/2022 3:30 PM Medical Record Number: [id number] Patient  Account Number: [id number] Date of Birth/Sex: Treating RN: 1923/09/30 (88 y.o. Harlow Ohms Primary Care Angelissa Supan: [name] Other Clinician: Referring Partick Musselman: Treating Allana Shrestha/Extender: Nicholaus Corolla in Treatment: 37 Encounter Discharge Information Items Discharge Condition: Stable Ambulatory Status: Wheelchair Discharge Destination: Home Transportation: Private Auto Accompanied By: daughter Schedule Follow-up Appointment: Yes Clinical Summary of Care: Patient Declined Electronic Signature(s) Signed: 09/10/2022 4:56:57 PM By: Sabas Sous By: Adline Peals on 09/10/2022 16:09:45 -------------------------------------------------------------------------------- Lower Extremity Assessment Details Patient Name: Date of Service: [name], [name] 09/10/2022 3:30 PM Medical Record Number: [id number] Patient Account Number: [id number] Date of Birth/Sex: Treating RN: 11/13/1922 (88 y.o. Harlow Ohms Primary Care Rickell Wiehe: [name] Other Clinician: Referring Delwin Raczkowski: Treating Saliha Salts/Extender: Nicholaus Corolla in Treatment: 37 Electronic Signature(s) Signed: 09/10/2022 4:56:57 PM By: Sabas Sous By: Adline Peals on 09/10/2022 15:28:52 -------------------------------------------------------------------------------- Multi Wound Chart Details Patient Name: Date of Service: Stacy Like Perry. 09/10/2022 3:30 PM Medical Record Number: [id number] Patient Account Number: [id number] [name], [name] ([id number]) [id number].pdf Page 4 of 8 Date of Birth/Sex: Treating RN: [birth year] (88 y.o. F) Primary Care Shamica Moree: Other Clinician: [name] Referring Billey Wojciak: Treating Arleny Kruger/Extender: Nicholaus Corolla in Treatment: 37 Vital Signs Height(in): 62 Pulse(bpm): 108 Weight(lbs): 110 Blood Pressure(mmHg): 117/74 Body Mass  Index(BMI): 20.1 Temperature(F): 97.4 Respiratory Rate(breaths/min): 18 [1:Photos:] [N/A:N/A] Thoracic spine N/A N/A Wound Location: Gradually Appeared N/A N/A Wounding Event: Pressure Ulcer N/A N/A Primary Etiology: Hypertension, Type II Diabetes N/A N/A Comorbid History: 12/19/2020 N/A N/A Date Acquired: 61 N/A N/A Weeks of Treatment: Open N/A N/A Wound Status: No N/A N/A Wound Recurrence: 0.5x0.2x0.3 N/A N/A Measurements L x W x D (cm) 0.079 N/A N/A A (cm) : rea 0.024 N/A N/A Volume (cm) : 97.70% N/A N/A % Reduction in A rea: 99.50% N/A N/A % Reduction in Volume: 12 Position 1 (o'clock): 2.8 Maximum Distance 1 (  cm): Yes N/A N/A Tunneling: Category/Stage IV N/A N/A Classification: Medium N/A N/A Exudate A mount: Serous N/A N/A Exudate Type: amber N/A N/A Exudate Color: Well defined, not attached N/A N/A Wound Margin: Large (67-100%) N/A N/A Granulation A mount: Red, Pink N/A N/A Granulation Quality: Small (1-33%) N/A N/A Necrotic A mount: Fat Layer (Subcutaneous Tissue): Yes N/A N/A Exposed Structures: Fascia: No Tendon: No Muscle: No Joint: No Bone: No Small (1-33%) N/A N/A Epithelialization: No Abnormalities Noted N/A N/A Periwound Skin Texture: No Abnormalities Noted N/A N/A Periwound Skin Moisture: No Abnormalities Noted N/A N/A Periwound Skin Color: No Abnormality N/A N/A Temperature: Treatment Notes Electronic Signature(s) Signed: 09/10/2022 3:41:31 PM By: Fredirick Maudlin MD FACS Entered By: Fredirick Maudlin on 09/10/2022 15:41:31 -------------------------------------------------------------------------------- Multi-Disciplinary Care Plan Details Patient Name: Date of Service: Stacy Like Perry. 09/10/2022 3:30 PM Medical Record Number: [id number] Patient Account Number: [id number] [name], [name] ([id number]) [id number] Page 5 of 8 Date of Birth/Sex: Treating RN: 04/30/1923 (88 y.o. Harlow Ohms Primary Care Dimples Probus: Other Clinician: [name] Referring Nyjah Schwake: Treating Kailand Seda/Extender: Nicholaus Corolla in Treatment: [place] reviewed with physician Active Inactive Pressure Nursing Diagnoses: Knowledge deficit related to causes and risk factors for pressure ulcer development Knowledge deficit related to management of pressures ulcers Potential for impaired tissue integrity related to pressure, friction, moisture, and shear Goals: Patient will remain free of pressure ulcers Date Initiated: 12/25/2021 Date Inactivated: 04/11/2022 Target Resolution Date: 04/06/2022 Goal Status: Met Patient/caregiver will verbalize understanding of pressure ulcer management Date Initiated: 12/25/2021 Target Resolution Date: 10/07/2022 Goal Status: Active Interventions: Assess: immobility, friction, shearing, incontinence upon admission and as needed Assess offloading mechanisms upon admission and as needed Notes: Wound/Skin Impairment Nursing Diagnoses: Impaired tissue integrity Knowledge deficit related to ulceration/compromised skin integrity Goals: Patient will have a decrease in wound volume by X% from date: (specify in notes) Date Initiated: 12/20/2021 Date Inactivated: 01/08/2022 Target Resolution Date: 01/12/2022 Goal Status: Unmet Unmet Reason: pressure relief Patient/caregiver will verbalize understanding of skin care regimen Date Initiated: 12/20/2021 Target Resolution Date: 10/07/2022 Goal Status: Active Ulcer/skin breakdown will have a volume reduction of 30% by week 4 Date Initiated: 12/20/2021 Date Inactivated: 04/11/2022 Target Resolution Date: 04/06/2022 Goal Status: Unmet Unmet Reason: infection, osteo Ulcer/skin breakdown will have a volume reduction of 50% by week 8 Date Initiated: 12/20/2021 Date Inactivated: 04/11/2022 Target Resolution Date: 04/06/2022 Unmet Reason: osteo, severe Goal Status:  Unmet kyphosis Interventions: Assess patient/caregiver ability to obtain necessary supplies Assess patient/caregiver ability to perform ulcer/skin care regimen upon admission and as needed Assess ulceration(s) every visit Notes: Electronic Signature(s) Signed: 09/10/2022 4:56:57 PM By: Adline Peals Entered By: Adline Peals on 09/10/2022 15:39:42 -------------------------------------------------------------------------------- Pain Assessment Details Patient Name: Date of Service: [name], [name] 09/10/2022 3:30 PM Medical Record Number: [id number] Patient Account Number: [id number] [name], [name] ([id number]) [phone].pdf Page 6 of 8 Date of Birth/Sex: Treating RN: 01/01/1923 (88 y.o. Harlow Ohms Primary Care Mariea Mcmartin: Other Clinician: [name] Referring Vahan Wadsworth: Treating Elsworth Ledin/Extender: Nicholaus Corolla in Treatment: 37 Active Problems Location of Pain Severity and Description of Pain Patient Has Paino No Site Locations Rate the pain. Current Pain Level: 0 Pain Management and Medication Current Pain Management: Electronic Signature(s) Signed: 09/10/2022 4:56:57 PM By: Adline Peals Entered By: Adline Peals on 09/10/2022 15:28:41 -------------------------------------------------------------------------------- Patient/Caregiver Education Details Patient Name: Date of Service: [name] 12/4/2023andnbsp3:30 PM Medical Record Number: [id number] Patient Account Number: [id number] Date of Birth/Gender: Treating RN: 05-20-1923 (88  y.o. Harlow Ohms Primary Care Physician: [name] Other Clinician: Referring Physician: Treating Physician/Extender: Nicholaus Corolla in Treatment: 73 Education Assessment Education Provided To: Patient Education Topics Provided Wound/Skin Impairment: Methods: Explain/Verbal Responses: Reinforcements needed, State  content correctly Electronic Signature(s) Signed: 09/10/2022 4:56:57 PM By: Adline Peals Entered By: Adline Peals on 09/10/2022 15:39:53 [name] ([id number]) [id number] Page 7 of 8 -------------------------------------------------------------------------------- Wound Assessment Details Patient Name: Date of Service: [name], [name] 09/10/2022 3:30 PM Medical Record Number: [id number] Patient Account Number: [id number] Date of Birth/Sex: Treating RN: 21-Jan-1923 (88 y.o. Harlow Ohms Primary Care Zsofia Prout: [name] Other Clinician: Referring Avyan Livesay: Treating Armya Westerhoff/Extender: Nicholaus Corolla in Treatment: 37 Wound Status Wound Number: 1 Primary Etiology: Pressure Ulcer Wound Location: Thoracic spine Wound Status: Open Wounding Event: Gradually Appeared Comorbid History: Hypertension, Type II Diabetes Date Acquired: 12/19/2020 Weeks Of Treatment: 37 Clustered Wound: No Photos Wound Measurements Length: (cm) 0.5 Width: (cm) 0.2 Depth: (cm) 0.3 Area: (cm) 0.079 Volume: (cm) 0.024 % Reduction in Area: 97.7% % Reduction in Volume: 99.5% Epithelialization: Small (1-33%) Tunneling: Yes Position (o'clock): 12 Maximum Distance: (cm) 2.8 Undermining: No Wound Description Classification: Category/Stage IV Wound Margin: Well defined, not attached Exudate Amount: Medium Exudate Type: Serous Exudate Color: amber Foul Odor After Cleansing: No Slough/Fibrino Yes Wound Bed Granulation Amount: Large (67-100%) Exposed Structure Granulation Quality: Red, Pink Fascia Exposed: No Necrotic Amount: Small (1-33%) Fat Layer (Subcutaneous Tissue) Exposed: Yes Necrotic Quality: Adherent Slough Tendon Exposed: No Muscle Exposed: No Joint Exposed: No Bone Exposed: No Periwound Skin Texture Texture Color No Abnormalities Noted: Yes No Abnormalities Noted: Yes Moisture Temperature / Pain No  Abnormalities Noted: Yes Temperature: No Abnormality Treatment Notes Wound #1 (Thoracic spine) Cleanser Wound Cleanser Discharge Instruction: Cleanse the wound with wound cleanser prior to applying a clean dressing using gauze sponges, not tissue or cotton balls. [name], CHRISTIANSEN ([id number]) [id number] Page 8 of 8 [employer] Ancillary Kit - 15 Day Supply Discharge Instruction: Use supplies as instructed; Kit contains: (15) Saline Bullets; (15) 3x3 Gauze; 15 pr Gloves cotton tipped applicators 6" Peri-Wound Care Skin Prep Discharge Instruction: Use skin prep as directed Topical Primary Dressing Iodoform packing strip 1/2 (in) Discharge Instruction: Lightly pack into wound especially into underminig from 12-3 o'clock Promogran Prisma Matrix, 4.34 (sq in) (silver collagen) Discharge Instruction: Moisten collagen with saline or hydrogel Secondary Dressing Zetuvit Plus Silicone Border Dressing 4x4 (in/in) Discharge Instruction: Apply silicone border over primary dressing as directed. Secured With Compression Wrap Compression Stockings Environmental education officer) Signed: 09/10/2022 4:56:57 PM By: Adline Peals Entered By: Adline Peals on 09/10/2022 15:34:17 -------------------------------------------------------------------------------- Vitals Details Patient Name: Date of Service: Stacy Like Perry. 09/10/2022 3:30 PM Medical Record Number: [id number] Patient Account Number: [id number] Date of Birth/Sex: Treating RN: 06-Mar-1923 (88 y.o. Harlow Ohms Primary Care Auriah Hollings: [name] Other Clinician: Referring Sidhant Helderman: Treating Yuya Vanwingerden/Extender: Nicholaus Corolla in Treatment: 37 Vital Signs Time Taken: 15:28 Temperature (F): 97.4 Height (in): 62 Pulse (bpm): 108 Weight (lbs): 110 Respiratory Rate (breaths/min): 18 Body Mass Index (BMI): 20.1 Blood Pressure (mmHg): 117/74 Reference Range: 80 - 120 mg /  dl Electronic Signature(s) Signed: 09/10/2022 4:56:57 PM By: Adline Peals Entered By: Adline Peals on 09/10/2022 15:28:34

## 2022-09-10 NOTE — Progress Notes (Signed)
MELAT, WRISLEY ([id number]) [id number] Page 1 of 9 Visit Report for 09/10/2022 Chief Complaint Document Details Patient Name: Date of Service: CARLOYN, [name] 09/10/2022 3:30 PM Medical Record Number: [id number] Patient Account Number: [id number] Date of Birth/Sex: Treating RN: 09-11-1923 (88 y.o. F) Primary Care Provider: [name] Other Clinician: Referring Provider: Treating Provider/Extender: Nicholaus Corolla in Treatment: 37 Information Obtained from: Patient Chief Complaint Patient is at the clinic for treatment of an open pressure ulcer Electronic Signature(s) Signed: 09/10/2022 3:41:38 PM By: Fredirick Maudlin MD FACS Entered By: Fredirick Maudlin on 09/10/2022 15:41:37 -------------------------------------------------------------------------------- HPI Details Patient Name: Date of Service: Stacy Like Perry. 09/10/2022 3:30 PM Medical Record Number: [id number] Patient Account Number: [id number] Date of Birth/Sex: Treating RN: Oct 07, 1923 (88 y.o. F) Primary Care Provider: [name] Other Clinician: Referring Provider: Treating Provider/Extender: Nicholaus Corolla in Treatment: 32 History of Present Illness HPI Description: ADMISSION 12/19/21 This is a 88 year old woman who is presenting to clinic today with a an open ulcer over her thoracic spine. She has severe kyphosis and a history of prior T12 fracture, as well as type 2 diabetes mellitus. The wound has been present for about a year. She is accompanied by her daughters, 1 of whom is a Marine scientist. They have been trying to offload the site by using pillows and supports to keep returned while in bed, and an eggcrate foam with a cut out for the wound for when she is sitting up. They have been applying Santyl to the site. She does have home health and the provider took a culture which was positive for Pseudomonas. She was on ciprofloxacin for this. A  recent repeat culture was negative. Most recent hemoglobin A1c was 6.5, in February. A C-reactive protein also obtained in February was elevated at 24.1. No imaging has been performed of the site. The patient denies significant pain. 12/25/2021: Last week, I took a bone biopsy as well as culture. Fortunately, the bone biopsy was negative for osteomyelitis. The culture returned with rare corynebacterium and rare Enterococcus faecalis. We have been using Santyl and Hydrofera Blue. T oday, the wound appears cleaner and there are buds of granulation tissue forming around the perimeter. 01/01/2022: Due to the positive culture, we switched to topical gentamicin. Today, she has had a little bit of a slough accumulation. The wound continues to have buds of granulation tissue forming. Apparently, the Hydrofera Blue was placed just over the wound opening rather than down in to contact the surface. No purulent drainage or odor. 01/08/2022: Last week, there was more slough in the wound bed, so I changed back to Santyl. There is now hypertrophic granulation tissue around the wound opening. Once again, we seem to be having some difficulty getting the Hydrofera Blue tucked up into the wound; there is undermining that extends for 3 to 4 cm at the 12 o'clock position and the Hydrofera Blue is simply sitting on top of the wound without being in contact with the surface. Bone is still palpable, but tissue is beginning to close in over it. 01/22/2022: The wound looks much cleaner this week and the hypertrophic granulation tissue responded appropriately to silver nitrate application. She continues to have undermining for about 4 cm from 12:00 to 4:00. I can still feel the bone in the base at about the 2 o'clock position, but it is less prominent and tissue is closing over. We have been using Santyl and Hydrofera Blue. The daughter that accompanies the patient today indicates  that they have been very diligent in making sure the  [facility] is tucked under and into the full base of the wound. 02/05/2022: The wound overall is improved. The undermining is about the same but there is less bone palpable. We have been using Santyl and Hydrofera Blue. Minimal slough accumulation in the 2 weeks since her last visit. ELVY, MCLARTY ([id number]) [id number] Page 2 of 9 02/19/2022: The visible wound surface is very clean without any slough accumulation. Although I can feel bone in the undermined portion of the wound, it is now covered with a layer of tissue. The undermined area is about the same size, however. We have been using Santyl with Hydrofera Blue. She has been approved for snap VAC use. 02/26/2022: Last week, we applied a snap VAC. The wound dimensions have come in by a bit in all directions. The VAC canister did fill, however, and was starting to leak so the patient's daughter removed the VAC and dressed the wound as we had been previously. There is a little bit of thin slough on the wound surface with some senescent tissue at the inferior margin. Bone is still palpable but covered with a layer of tissue. No odor. 03/06/2022: We continue to have technical difficulties with the snap VAC which resulted in the patient's family removing it and going back to the dressing changes with Hydrofera Blue. It sounds like there are issues maintaining suction and the drape is getting bunched up creating a leak. The wound measured larger today, but the intake nurse thinks that perhaps her measurements were taken slightly differently than last week's. There is just some thin slough on the wound surface as well as some hypertrophic granulation tissue on the lateral border. 03/13/2022: The snap VAC worked much better this week and only began leaking yesterday. At that point, the patient's family removed it and applied Hydrofera Blue to her wound. Today, the tissue coverage over the bone that had been exposed is more  substantial. She still has undermining present. There is also some senescent skin around the wound orifice along with some hypertrophic granulation tissue. 03/20/2022: The snap VAC unfortunately failed on Friday. There has really been no significant change to the wound for several weeks. The degree of undermining is unchanged. The surface is clean. 03/27/2022: Once again, the snap VAC failed prematurely and had to be removed on Saturday. The undermining remains the same. There is more tissue coverage of the previously exposed bone. The wound surface has just a light layer of biofilm and thin slough. She was approved for a standard wound VAC, but there are currently no home health agencies able to staff dressing changes for her; it is too difficult for her and her family to bring her to the wound center more than once a week. 04/03/2022: The snap VAC stayed on but the canister filled up on Sunday. The direct depth of the wound is a little bit shallower but the undermining has not really changed. There is some slightly discolored, darker tissue at the 9 o'clock position. No odor or purulent drainage. 04/11/2022: Using 2 canisters worked well and the snap VAC lasted until yesterday. She has her standard wound VAC with her today. The orifice of the wound has contracted somewhat. There is still substantial undermining at 12:00, but the undermined portion to the right between 2 and 4:00 has closed and somewhat. The previously exposed bone is no longer even palpable under the overlying tissue. No concern for infection. 04/17/2022: A standard  VAC dressing was initiated last week. The family has been changing it at home, due to the inability to secure home health services. It is a little bit challenging to apply, due to the small orifice of the wound with substantial undermining. Today, the undermining at 12:00 seems like it might be a little bit deeper, but the 2-4 o'clock undermining is decreased. There is  excellent tissue coverage over the previously exposed bone. There is a little bit of slough accumulation at the orifice, along with some senescent skin. 04/23/2022: The wound VAC was not changed all week due to the family being overwhelmed with other issues. Fortunately, there does not seem to have been any untoward effects from this. The orifice of the wound remains unchanged but the undermining and tunneling has narrowed to just 1 area at 12:00. No purulent drainage or odor. Good tissue coverage of the previously exposed bone. 04/30/2022: The tunneling has narrowed substantially. There is no purulent drainage or odor from the wound. The orifice of the wound is about the same size and there is some hypertrophic granulation tissue at the opening. 05/07/2022: The undermining has come in substantially. The only particularly deep area is from 1-2 o'clock. The rest of the wound is clean without any purulent drainage or odor. 05/14/2022: The orifice of the wound has contracted further. She continues to have just under 5 cm of tunneling at 12:00, but the wound is clean without any purulent drainage or odor. 05/21/2022: The tunnel has come in by about half a centimeter. The wound is clean without any slough. 05/28/2022: The tunnel continues to contract. The wound is clean with good granulation tissue at the base. No concern for infection. 06/04/2022: The tunnel is about the same length this week, but it is quite a bit narrower. The wound is clean with good granulation tissue. Unfortunately, her family applied the track pad directly over the sponge without any intervening drape and without bridging it away from her spine so she has a small ulcer from the track pad just caudal to the wound. 06/12/2022: The small ulcer from the track pad that was present last week has healed. Otherwise the wound is basically unchanged. 06/18/2022: No real change to the tunneling. The surface of the wound is clean. 06/25/2022: We  discontinued the wound VAC last week and has been packing with iodoform packing strips. The tunneling has come in by about half a centimeter. The wound is clean. 07/02/2022: There has really been no change to the wound over the past week. There is a little hypertrophic granulation tissue at the orifice but otherwise the tunneling is about the same and the wound is clean. 07/16/2022: The tunneling has come in by a couple of millimeters. The wound is clean otherwise. 07/30/2022: The tunnel has come in by half a centimeter. The wound is clean. 08/13/2022: The tunnel has come in by another half centimeter. The wound is clean without any concern for infection. 08/28/2022: The tunnel is down to 2.5 cm. There is some hypertrophic granulation tissue on the wound surface. The wound is clean without any concern for infection. 09/10/2022: The tunnel has contracted further and now measures 2.2 cm. No hypertrophic granulation tissue present. The wound is clean without concern for infection. Electronic Signature(s) Signed: 09/10/2022 3:42:10 PM By: Fredirick Maudlin MD FACS Entered By: Fredirick Maudlin on 09/10/2022 15:42:10 [name] ([id number]) [id number] Page 3 of 9 -------------------------------------------------------------------------------- Physical Exam Details Patient Name: Date of Service: [name], [name]. 09/10/2022 3:30 PM Medical  Record Number: [id number] Patient Account Number: [id number] Date of Birth/Sex: Treating RN: 24-Jul-1923 (88 y.o. F) Primary Care Provider: [name] Other Clinician: Referring Provider: Treating Provider/Extender: Nicholaus Corolla in Treatment: 37 Constitutional . Slightly tachycardic, asymptomatic.. . . No acute distress. Respiratory Normal work of breathing on room air. Notes 09/10/2022: The tunnel has contracted further and now measures 2.2 cm. No hypertrophic granulation tissue present. The wound is  clean without concern for infection. Electronic Signature(s) Signed: 09/10/2022 3:42:38 PM By: Fredirick Maudlin MD FACS Entered By: Fredirick Maudlin on 09/10/2022 15:42:37 -------------------------------------------------------------------------------- Physician Orders Details Patient Name: Date of Service: Marijean Heath 09/10/2022 3:30 PM Medical Record Number: [id number] Patient Account Number: [id number] Date of Birth/Sex: Treating RN: 18-Sep-1923 (88 y.o. Harlow Ohms Primary Care Provider: [name] Other Clinician: Referring Provider: Treating Provider/Extender: Nicholaus Corolla in Treatment: [phone] Verbal / Phone Orders: No Diagnosis Coding ICD-10 Coding Code Description L89.104 Pressure ulcer of unspecified part of back, stage 4 E11.622 Type 2 diabetes mellitus with other skin ulcer S22.089S Unspecified fracture of T11-T12 vertebra, sequela Follow-up Appointments ppointment in 2 weeks. - Dr Celine Ahr - Room 1 - 12/19 Return A Anesthetic Wound #1 Thoracic spine (In clinic) Topical Lidocaine 5% applied to wound bed Bathing/ Shower/ Hygiene May shower with protection but do not get wound dressing(s) wet. Off-Loading Turn and reposition every 2 hours Other: - KEEP PRESSURE OFF OF BACK MAY USE EGG CRATE CUSHION Additional Orders / Instructions Follow Nutritious Diet ONIA, SHIFLETT Perry ([id number]) [id number] Page 4 of 9 Wound Treatment Wound #1 - Thoracic spine Cleanser: Wound Cleanser (Generic) 1 x Per Day/30 Days Discharge Instructions: Cleanse the wound with wound cleanser prior to applying a clean dressing using gauze sponges, not tissue or cotton balls. Cleanser: Byram Ancillary Kit - 15 Day Supply (Generic) 1 x Per Day/30 Days Discharge Instructions: Use supplies as instructed; Kit contains: (15) Saline Bullets; (15) 3x3 Gauze; 15 pr Gloves Cleanser: cotton tipped applicators 6" (Generic) 1 x Per Day/30  Days Peri-Wound Care: Skin Prep 1 x Per Day/30 Days Discharge Instructions: Use skin prep as directed Prim Dressing: Iodoform packing strip 1/2 (in) (Generic) 1 x Per Day/30 Days ary Discharge Instructions: Lightly pack into wound especially into underminig from 12-3 o'clock Prim Dressing: Promogran Prisma Matrix, 4.34 (sq in) (silver collagen) (Dispense As Written) 1 x Per Day/30 Days ary Discharge Instructions: Moisten collagen with saline or hydrogel Secondary Dressing: Zetuvit Plus Silicone Border Dressing 4x4 (in/in) (Generic) 1 x Per Day/30 Days Discharge Instructions: Apply silicone border over primary dressing as directed. Patient Medications llergies: sulfamethoxazole A Notifications Medication Indication Start End 09/10/2022 lidocaine DOSE topical 5 % ointment - ointment topical Electronic Signature(s) Signed: 09/10/2022 4:47:46 PM By: Fredirick Maudlin MD FACS Entered By: Fredirick Maudlin on 09/10/2022 15:42:53 -------------------------------------------------------------------------------- Problem List Details Patient Name: Date of Service: Stacy Like Perry. 09/10/2022 3:30 PM Medical Record Number: [id number] Patient Account Number: [id number] Date of Birth/Sex: Treating RN: 04-21-1923 (88 y.o. F) Primary Care Provider: [name] Other Clinician: Referring Provider: Treating Provider/Extender: Nicholaus Corolla in Treatment: 37 Active Problems ICD-10 Encounter Code Description Active Date MDM Diagnosis L89.104 Pressure ulcer of unspecified part of back, stage 4 12/19/2021 No Yes E11.622 Type 2 diabetes mellitus with other skin ulcer 12/19/2021 No Yes S22.089S Unspecified fracture of T11-T12 vertebra, sequela 12/19/2021 No Yes Inactive Problems Resolved Problems [name], [name] ([id number]) [id number] Page 5 of 9 Electronic Signature(s) Signed: 09/10/2022 3:41:25 PM By: Fredirick Maudlin MD  FACS Entered By: Fredirick Maudlin on 09/10/2022 15:41:24 -------------------------------------------------------------------------------- Progress Note Details Patient Name: Date of Service: [name], [name] 09/10/2022 3:30 PM Medical Record Number: [id number] Patient Account Number: [id number] Date of Birth/Sex: Treating RN: Mar 27, 1923 (88 y.o. F) Primary Care Provider: [name] Other Clinician: Referring Provider: Treating Provider/Extender: Nicholaus Corolla in Treatment: 37 Subjective Chief Complaint Information obtained from Patient Patient is at the clinic for treatment of an open pressure ulcer History of Present Illness (HPI) ADMISSION 12/19/21 This is a 88 year old woman who is presenting to clinic today with a an open ulcer over her thoracic spine. She has severe kyphosis and a history of prior T12 fracture, as well as type 2 diabetes mellitus. The wound has been present for about a year. She is accompanied by her daughters, 1 of whom is a Marine scientist. They have been trying to offload the site by using pillows and supports to keep returned while in bed, and an eggcrate foam with a cut out for the wound for when she is sitting up. They have been applying Santyl to the site. She does have home health and the provider took a culture which was positive for Pseudomonas. She was on ciprofloxacin for this. A recent repeat culture was negative. Most recent hemoglobin A1c was 6.5, in February. A C-reactive protein also obtained in February was elevated at 24.1. No imaging has been performed of the site. The patient denies significant pain. 12/25/2021: Last week, I took a bone biopsy as well as culture. Fortunately, the bone biopsy was negative for osteomyelitis. The culture returned with rare corynebacterium and rare Enterococcus faecalis. We have been using Santyl and Hydrofera Blue. T oday, the wound appears cleaner and there are buds of granulation tissue forming around the  perimeter. 01/01/2022: Due to the positive culture, we switched to topical gentamicin. Today, she has had a little bit of a slough accumulation. The wound continues to have buds of granulation tissue forming. Apparently, the Hydrofera Blue was placed just over the wound opening rather than down in to contact the surface. No purulent drainage or odor. 01/08/2022: Last week, there was more slough in the wound bed, so I changed back to Santyl. There is now hypertrophic granulation tissue around the wound opening. Once again, we seem to be having some difficulty getting the Hydrofera Blue tucked up into the wound; there is undermining that extends for 3 to 4 cm at the 12 o'clock position and the Hydrofera Blue is simply sitting on top of the wound without being in contact with the surface. Bone is still palpable, but tissue is beginning to close in over it. 01/22/2022: The wound looks much cleaner this week and the hypertrophic granulation tissue responded appropriately to silver nitrate application. She continues to have undermining for about 4 cm from 12:00 to 4:00. I can still feel the bone in the base at about the 2 o'clock position, but it is less prominent and tissue is closing over. We have been using Santyl and Hydrofera Blue. The daughter that accompanies the patient today indicates that they have been very diligent in making sure the [facility] is tucked under and into the full base of the wound. 02/05/2022: The wound overall is improved. The undermining is about the same but there is less bone palpable. We have been using Santyl and Hydrofera Blue. Minimal slough accumulation in the 2 weeks since her last visit. 02/19/2022: The visible wound surface is very clean without any slough accumulation. Although  I can feel bone in the undermined portion of the wound, it is now covered with a layer of tissue. The undermined area is about the same size, however. We have been using Santyl with Hydrofera  Blue. She has been approved for snap VAC use. 02/26/2022: Last week, we applied a snap VAC. The wound dimensions have come in by a bit in all directions. The VAC canister did fill, however, and was starting to leak so the patient's daughter removed the VAC and dressed the wound as we had been previously. There is a little bit of thin slough on the wound surface with some senescent tissue at the inferior margin. Bone is still palpable but covered with a layer of tissue. No odor. 03/06/2022: We continue to have technical difficulties with the snap VAC which resulted in the patient's family removing it and going back to the dressing changes with Hydrofera Blue. It sounds like there are issues maintaining suction and the drape is getting bunched up creating a leak. The wound measured larger today, but the intake nurse thinks that perhaps her measurements were taken slightly differently than last week's. There is just some thin slough on the wound surface as well as some hypertrophic granulation tissue on the lateral border. 03/13/2022: The snap VAC worked much better this week and only began leaking yesterday. At that point, the patient's family removed it and applied Hydrofera Blue to her wound. Today, the tissue coverage over the bone that had been exposed is more substantial. She still has undermining present. There is also some senescent skin around the wound orifice along with some hypertrophic granulation tissue. 03/20/2022: The snap VAC unfortunately failed on Friday. There has really been no significant change to the wound for several weeks. The degree of undermining is unchanged. The surface is clean. 03/27/2022: Once again, the snap VAC failed prematurely and had to be removed on Saturday. The undermining remains the same. There is more tissue coverage of the previously exposed bone. The wound surface has just a light layer of biofilm and thin slough. She was approved for a standard wound VAC, but  there are currently no home health agencies able to staff dressing changes for her; it is too difficult for her and her family to bring her to the wound center more than once a week. MAKINZIE, CONSIDINE ([id number]) [id number] Page 6 of 9 04/03/2022: The snap VAC stayed on but the canister filled up on Sunday. The direct depth of the wound is a little bit shallower but the undermining has not really changed. There is some slightly discolored, darker tissue at the 9 o'clock position. No odor or purulent drainage. 04/11/2022: Using 2 canisters worked well and the snap VAC lasted until yesterday. She has her standard wound VAC with her today. The orifice of the wound has contracted somewhat. There is still substantial undermining at 12:00, but the undermined portion to the right between 2 and 4:00 has closed and somewhat. The previously exposed bone is no longer even palpable under the overlying tissue. No concern for infection. 04/17/2022: A standard VAC dressing was initiated last week. The family has been changing it at home, due to the inability to secure home health services. It is a little bit challenging to apply, due to the small orifice of the wound with substantial undermining. Today, the undermining at 12:00 seems like it might be a little bit deeper, but the 2-4 o'clock undermining is decreased. There is excellent tissue coverage over the previously exposed bone.  There is a little bit of slough accumulation at the orifice, along with some senescent skin. 04/23/2022: The wound VAC was not changed all week due to the family being overwhelmed with other issues. Fortunately, there does not seem to have been any untoward effects from this. The orifice of the wound remains unchanged but the undermining and tunneling has narrowed to just 1 area at 12:00. No purulent drainage or odor. Good tissue coverage of the previously exposed bone. 04/30/2022: The tunneling has narrowed  substantially. There is no purulent drainage or odor from the wound. The orifice of the wound is about the same size and there is some hypertrophic granulation tissue at the opening. 05/07/2022: The undermining has come in substantially. The only particularly deep area is from 1-2 o'clock. The rest of the wound is clean without any purulent drainage or odor. 05/14/2022: The orifice of the wound has contracted further. She continues to have just under 5 cm of tunneling at 12:00, but the wound is clean without any purulent drainage or odor. 05/21/2022: The tunnel has come in by about half a centimeter. The wound is clean without any slough. 05/28/2022: The tunnel continues to contract. The wound is clean with good granulation tissue at the base. No concern for infection. 06/04/2022: The tunnel is about the same length this week, but it is quite a bit narrower. The wound is clean with good granulation tissue. Unfortunately, her family applied the track pad directly over the sponge without any intervening drape and without bridging it away from her spine so she has a small ulcer from the track pad just caudal to the wound. 06/12/2022: The small ulcer from the track pad that was present last week has healed. Otherwise the wound is basically unchanged. 06/18/2022: No real change to the tunneling. The surface of the wound is clean. 06/25/2022: We discontinued the wound VAC last week and has been packing with iodoform packing strips. The tunneling has come in by about half a centimeter. The wound is clean. 07/02/2022: There has really been no change to the wound over the past week. There is a little hypertrophic granulation tissue at the orifice but otherwise the tunneling is about the same and the wound is clean. 07/16/2022: The tunneling has come in by a couple of millimeters. The wound is clean otherwise. 07/30/2022: The tunnel has come in by half a centimeter. The wound is clean. 08/13/2022: The tunnel has come in  by another half centimeter. The wound is clean without any concern for infection. 08/28/2022: The tunnel is down to 2.5 cm. There is some hypertrophic granulation tissue on the wound surface. The wound is clean without any concern for infection. 09/10/2022: The tunnel has contracted further and now measures 2.2 cm. No hypertrophic granulation tissue present. The wound is clean without concern for infection. Patient History Family History Unknown History. Social History Never smoker, Marital Status - Married, Alcohol Use - Never, Drug Use - No History, Caffeine Use - Rarely. Medical History Ear/Nose/Mouth/Throat Denies history of Chronic sinus problems/congestion, Middle ear problems Cardiovascular Patient has history of Hypertension Endocrine Patient has history of Type II Diabetes Immunological Denies history of Lupus Erythematosus, Raynaudoos, Scleroderma Oncologic Denies history of Received Chemotherapy, Received Radiation Hospitalization/Surgery History - t/11 t/12 FRACTURE/SURGERY. - FEMUR FX SURGERY. - TOTAL HYSTERECTOMY. - CHOLECYSTECTOMY. - TONSILLECTOMY. Medical A Surgical History Notes nd Ear/Nose/Mouth/Throat PARTIAL HEARING LEFT HEAR W/ HEARING AID TOTAL DEAFNESS RIGHT EAR Gastrointestinal Melena Endocrine Hypothyroidism Hyperlipidemia associated with type 2 diabetes mellitus ([place]) Musculoskeletal  Closed T12 fracture ([place]) Hip fracture ([place]) Closed comminuted intertrochanteric fracture of proximal end of right femur [facility]) SHEARON, [name] ([id number]) [id number] Page 7 of 9 Objective Constitutional Slightly tachycardic, asymptomatic.Marland Kitchen No acute distress. Vitals Time Taken: 3:28 PM, Height: 62 in, Weight: 110 lbs, BMI: 20.1, Temperature: 97.4 F, Pulse: 108 bpm, Respiratory Rate: 18 breaths/min, Blood Pressure: 117/74 mmHg. Respiratory Normal work of breathing on room air. General Notes: 09/10/2022: The tunnel has contracted further and  now measures 2.2 cm. No hypertrophic granulation tissue present. The wound is clean without concern for infection. Integumentary (Hair, Skin) Wound #1 status is Open. Original cause of wound was Gradually Appeared. The date acquired was: 12/19/2020. The wound has been in treatment 37 weeks. The wound is located on the Thoracic spine. The wound measures 0.5cm length x 0.2cm width x 0.3cm depth; 0.079cm^2 area and 0.024cm^3 volume. There is Fat Layer (Subcutaneous Tissue) exposed. There is no undermining noted, however, there is tunneling at 12:00 with a maximum distance of 2.8cm. There is a medium amount of serous drainage noted. The wound margin is well defined and not attached to the wound base. There is large (67-100%) red, pink granulation within the wound bed. There is a small (1-33%) amount of necrotic tissue within the wound bed including Adherent Slough. The periwound skin appearance had no abnormalities noted for texture. The periwound skin appearance had no abnormalities noted for moisture. The periwound skin appearance had no abnormalities noted for color. Periwound temperature was noted as No Abnormality. 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 - Dr Celine Ahr - Room 1 - 12/19 Anesthetic: Wound #1 Thoracic spine: (In clinic) Topical Lidocaine 5% applied to wound bed Bathing/ Shower/ Hygiene: May shower with protection but do not get wound dressing(s) wet. Off-Loading: Turn and reposition every 2 hours Other: - KEEP PRESSURE OFF OF BACK MAY USE EGG CRATE CUSHION Additional Orders / Instructions: Follow Nutritious Diet The following medication(s) was prescribed: lidocaine topical 5 % ointment ointment topical was prescribed at facility WOUND #1: - Thoracic spine Wound  Laterality: Cleanser: Wound Cleanser (Generic) 1 x Per Day/30 Days Discharge Instructions: Cleanse the wound with wound cleanser prior to applying a clean dressing using gauze sponges, not tissue or cotton balls. Cleanser: [place] Ancillary Kit - 15 Day Supply (Generic) 1 x Per Day/30 Days Discharge Instructions: Use supplies as instructed; Kit contains: (15) Saline Bullets; (15) 3x3 Gauze; 15 pr Gloves Cleanser: cotton tipped applicators 6" (Generic) 1 x Per Day/30 Days Peri-Wound Care: Skin Prep 1 x Per Day/30 Days Discharge Instructions: Use skin prep as directed Prim Dressing: Iodoform packing strip 1/2 (in) (Generic) 1 x Per Day/30 Days ary Discharge Instructions: Lightly pack into wound especially into underminig from 12-3 o'clock Prim Dressing: Promogran Prisma Matrix, 4.34 (sq in) (silver collagen) (Dispense As Written) 1 x Per Day/30 Days ary Discharge Instructions: Moisten collagen with saline or hydrogel Secondary Dressing: Zetuvit Plus Silicone Border Dressing 4x4 (in/in) (Generic) 1 x Per Day/30 Days Discharge Instructions: Apply silicone border over primary dressing as directed. 09/10/2022: The tunnel has contracted further and now measures 2.2 cm. No hypertrophic granulation tissue present. The wound is clean without concern for infection. No debridement was necessary today. We will continue to pack the wound with Prisma silver collagen and iodoform packing strips. Follow-up in 2 weeks. [name], [name] ([id number]) [id number]  Page 8 of 9 Electronic Signature(s) Signed: 09/10/2022 3:43:27 PM By: Fredirick Maudlin MD FACS Entered By: Fredirick Maudlin on 09/10/2022 15:43:27 -------------------------------------------------------------------------------- HxROS Details Patient Name: Date of Service: Stacy Like Perry. 09/10/2022 3:30 PM Medical Record Number: [id number] Patient Account Number: [id number] Date of Birth/Sex: Treating RN: Jul 17, 1923 (88 y.o.  F) Primary Care Provider: [name] Other Clinician: Referring Provider: Treating Provider/Extender: Nicholaus Corolla in Treatment: 37 Ear/Nose/Mouth/Throat Medical History: Negative for: Chronic sinus problems/congestion; Middle ear problems Past Medical History Notes: PARTIAL HEARING LEFT HEAR W/ HEARING AID TOTAL DEAFNESS RIGHT EAR Cardiovascular Medical History: Positive for: Hypertension Gastrointestinal Medical History: Past Medical History Notes: Melena Endocrine Medical History: Positive for: Type II Diabetes Past Medical History Notes: Hypothyroidism Hyperlipidemia associated with type 2 diabetes mellitus ([place]) Treated with: Diet Immunological Medical History: Negative for: Lupus Erythematosus; Raynauds; Scleroderma Musculoskeletal Medical History: Past Medical History Notes: Closed T12 fracture ([place]) Hip fracture ([place]) Closed comminuted intertrochanteric fracture of proximal end of right femur [facility]) Oncologic Medical History: Negative for: Received Chemotherapy; Received Radiation Immunizations Pneumococcal Vaccine: Received Pneumococcal Vaccination: Yes Received Pneumococcal Vaccination On or After 60th Birthday: No Implantable Devices None [name], [name] Perry ([id number]) [id number] Page 9 of 9 Hospitalization / Surgery History Type of Hospitalization/Surgery t/11 t/12 FRACTURE/SURGERY FEMUR FX SURGERY TOTAL HYSTERECTOMY CHOLECYSTECTOMY TONSILLECTOMY Family and Social History Unknown History: Yes; Never smoker; Marital Status - Married; Alcohol Use: Never; Drug Use: No History; Caffeine Use: Rarely; Financial Concerns: No; Food, Clothing or Shelter Needs: No; Support System Lacking: No; Transportation Concerns: No Electronic Signature(s) Signed: 09/10/2022 4:47:46 PM By: Fredirick Maudlin MD FACS Entered By: Fredirick Maudlin on 09/10/2022  15:42:15 -------------------------------------------------------------------------------- SuperBill Details Patient Name: Date of Service: Marijean Heath 09/10/2022 Medical Record Number: [id number] Patient Account Number: [id number] Date of Birth/Sex: Treating RN: Jul 21, 1923 (88 y.o. F) Primary Care Provider: [name] Other Clinician: Referring Provider: Treating Provider/Extender: Nicholaus Corolla in Treatment: 37 Diagnosis Coding ICD-10 Codes Code Description L89.104 Pressure ulcer of unspecified part of back, stage 4 E11.622 Type 2 diabetes mellitus with other skin ulcer S22.089S Unspecified fracture of T11-T12 vertebra, sequela Facility Procedures : CPT4 Code: [id number] Description: 99213 - WOUND CARE VISIT-LEV 3 EST PT Modifier: Quantity: 1 Physician Procedures : CPT4 Code Description Modifier [id number] 99214 - WC PHYS LEVEL 4 - EST PT ICD-10 Diagnosis Description L89.104 Pressure ulcer of unspecified part of back, stage 4 E11.622 Type 2 diabetes mellitus with other skin ulcer S22.089S Unspecified fracture of  T11-T12 vertebra, sequela Quantity: 1 Electronic Signature(s) Signed: 09/10/2022 4:47:46 PM By: Fredirick Maudlin MD FACS Signed: 09/10/2022 4:56:57 PM By: Adline Peals Previous Signature: 09/10/2022 3:43:41 PM Version By: Fredirick Maudlin MD FACS Entered By: Adline Peals on 09/10/2022 16:09:25

## 2022-09-25 ENCOUNTER — Encounter (HOSPITAL_BASED_OUTPATIENT_CLINIC_OR_DEPARTMENT_OTHER): Payer: Medicare Other | Admitting: General Surgery

## 2022-10-12 ENCOUNTER — Encounter (HOSPITAL_BASED_OUTPATIENT_CLINIC_OR_DEPARTMENT_OTHER): Payer: Medicare Other | Admitting: General Surgery

## 2022-10-19 ENCOUNTER — Encounter (HOSPITAL_BASED_OUTPATIENT_CLINIC_OR_DEPARTMENT_OTHER): Payer: Medicare Other | Admitting: General Surgery

## 2022-10-26 ENCOUNTER — Ambulatory Visit (HOSPITAL_BASED_OUTPATIENT_CLINIC_OR_DEPARTMENT_OTHER): Payer: Medicare Other | Admitting: General Surgery

## 2023-11-09 DEATH — deceased
